# Patient Record
Sex: Female | Born: 1960 | Race: White | Hispanic: No | Marital: Married | Smoking: Former smoker
Health system: Southern US, Community
[De-identification: ages and names within clinical notes are randomized; demographics above are authoritative.]

## PROBLEM LIST (undated history)

## (undated) DIAGNOSIS — M62838 Other muscle spasm: Secondary | ICD-10-CM

## (undated) DIAGNOSIS — F419 Anxiety disorder, unspecified: Secondary | ICD-10-CM

## (undated) DIAGNOSIS — K219 Gastro-esophageal reflux disease without esophagitis: Secondary | ICD-10-CM

## (undated) DIAGNOSIS — G971 Other reaction to spinal and lumbar puncture: Secondary | ICD-10-CM

## (undated) DIAGNOSIS — M549 Dorsalgia, unspecified: Secondary | ICD-10-CM

## (undated) DIAGNOSIS — J189 Pneumonia, unspecified organism: Secondary | ICD-10-CM

## (undated) DIAGNOSIS — E785 Hyperlipidemia, unspecified: Secondary | ICD-10-CM

## (undated) DIAGNOSIS — IMO0002 Reserved for concepts with insufficient information to code with codable children: Secondary | ICD-10-CM

## (undated) DIAGNOSIS — N3010 Interstitial cystitis (chronic) without hematuria: Secondary | ICD-10-CM

## (undated) DIAGNOSIS — R112 Nausea with vomiting, unspecified: Secondary | ICD-10-CM

## (undated) DIAGNOSIS — F32A Depression, unspecified: Secondary | ICD-10-CM

## (undated) DIAGNOSIS — C801 Malignant (primary) neoplasm, unspecified: Secondary | ICD-10-CM

## (undated) DIAGNOSIS — Z87442 Personal history of urinary calculi: Secondary | ICD-10-CM

## (undated) DIAGNOSIS — R200 Anesthesia of skin: Secondary | ICD-10-CM

## (undated) DIAGNOSIS — M2550 Pain in unspecified joint: Secondary | ICD-10-CM

## (undated) DIAGNOSIS — R350 Frequency of micturition: Secondary | ICD-10-CM

## (undated) DIAGNOSIS — F329 Major depressive disorder, single episode, unspecified: Secondary | ICD-10-CM

## (undated) DIAGNOSIS — K2270 Barrett's esophagus without dysplasia: Secondary | ICD-10-CM

## (undated) DIAGNOSIS — G8929 Other chronic pain: Secondary | ICD-10-CM

## (undated) DIAGNOSIS — M2540 Effusion, unspecified joint: Secondary | ICD-10-CM

## (undated) DIAGNOSIS — Z9889 Other specified postprocedural states: Secondary | ICD-10-CM

## (undated) DIAGNOSIS — I10 Essential (primary) hypertension: Secondary | ICD-10-CM

## (undated) HISTORY — DX: Hyperlipidemia, unspecified: E78.5

## (undated) HISTORY — PX: CHOLECYSTECTOMY: SHX55

## (undated) HISTORY — DX: Essential (primary) hypertension: I10

## (undated) HISTORY — DX: Major depressive disorder, single episode, unspecified: F32.9

## (undated) HISTORY — DX: Reserved for concepts with insufficient information to code with codable children: IMO0002

## (undated) HISTORY — PX: OOPHORECTOMY: SHX86

## (undated) HISTORY — DX: Interstitial cystitis (chronic) without hematuria: N30.10

## (undated) HISTORY — DX: Anxiety disorder, unspecified: F41.9

## (undated) HISTORY — DX: Gastro-esophageal reflux disease without esophagitis: K21.9

## (undated) HISTORY — PX: APPENDECTOMY: SHX54

## (undated) HISTORY — PX: TOTAL ABDOMINAL HYSTERECTOMY: SHX209

## (undated) HISTORY — PX: KNEE ARTHROSCOPY: SUR90

## (undated) HISTORY — DX: Depression, unspecified: F32.A

## (undated) HISTORY — PX: OTHER SURGICAL HISTORY: SHX169

---

## 1998-05-01 ENCOUNTER — Emergency Department (HOSPITAL_COMMUNITY): Admission: EM | Admit: 1998-05-01 | Discharge: 1998-05-01 | Payer: Self-pay

## 1998-05-14 ENCOUNTER — Emergency Department (HOSPITAL_COMMUNITY): Admission: EM | Admit: 1998-05-14 | Discharge: 1998-05-14 | Payer: Self-pay | Admitting: Internal Medicine

## 1998-07-21 ENCOUNTER — Emergency Department (HOSPITAL_COMMUNITY): Admission: EM | Admit: 1998-07-21 | Discharge: 1998-07-21 | Payer: Self-pay | Admitting: Internal Medicine

## 1998-07-21 ENCOUNTER — Encounter: Payer: Self-pay | Admitting: Internal Medicine

## 1998-10-15 ENCOUNTER — Emergency Department (HOSPITAL_COMMUNITY): Admission: EM | Admit: 1998-10-15 | Discharge: 1998-10-15 | Payer: Self-pay | Admitting: Emergency Medicine

## 1998-10-15 ENCOUNTER — Encounter: Payer: Self-pay | Admitting: Emergency Medicine

## 1998-12-14 ENCOUNTER — Other Ambulatory Visit: Admission: RE | Admit: 1998-12-14 | Discharge: 1998-12-14 | Payer: Self-pay | Admitting: Gynecology

## 1999-03-05 ENCOUNTER — Observation Stay (HOSPITAL_COMMUNITY): Admission: EM | Admit: 1999-03-05 | Discharge: 1999-03-06 | Payer: Self-pay | Admitting: General Surgery

## 1999-03-05 ENCOUNTER — Encounter: Payer: Self-pay | Admitting: General Surgery

## 1999-07-09 ENCOUNTER — Emergency Department (HOSPITAL_COMMUNITY): Admission: EM | Admit: 1999-07-09 | Discharge: 1999-07-09 | Payer: Self-pay | Admitting: Emergency Medicine

## 1999-07-09 ENCOUNTER — Encounter: Payer: Self-pay | Admitting: Emergency Medicine

## 1999-08-30 ENCOUNTER — Emergency Department (HOSPITAL_COMMUNITY): Admission: EM | Admit: 1999-08-30 | Discharge: 1999-08-30 | Payer: Self-pay | Admitting: Emergency Medicine

## 1999-12-22 ENCOUNTER — Other Ambulatory Visit: Admission: RE | Admit: 1999-12-22 | Discharge: 1999-12-22 | Payer: Self-pay | Admitting: Gynecology

## 2001-01-12 ENCOUNTER — Other Ambulatory Visit: Admission: RE | Admit: 2001-01-12 | Discharge: 2001-01-12 | Payer: Self-pay | Admitting: Gynecology

## 2001-09-05 ENCOUNTER — Inpatient Hospital Stay (HOSPITAL_COMMUNITY): Admission: AD | Admit: 2001-09-05 | Discharge: 2001-09-07 | Payer: Self-pay | Admitting: Orthopedic Surgery

## 2001-09-06 ENCOUNTER — Encounter: Payer: Self-pay | Admitting: Orthopedic Surgery

## 2001-09-07 ENCOUNTER — Encounter: Payer: Self-pay | Admitting: Orthopaedic Surgery

## 2001-09-13 ENCOUNTER — Encounter: Payer: Self-pay | Admitting: Orthopaedic Surgery

## 2001-09-13 ENCOUNTER — Ambulatory Visit (HOSPITAL_COMMUNITY): Admission: RE | Admit: 2001-09-13 | Discharge: 2001-09-13 | Payer: Self-pay | Admitting: Orthopaedic Surgery

## 2001-09-20 ENCOUNTER — Observation Stay (HOSPITAL_COMMUNITY): Admission: RE | Admit: 2001-09-20 | Discharge: 2001-09-21 | Payer: Self-pay | Admitting: Orthopaedic Surgery

## 2001-09-20 ENCOUNTER — Encounter (INDEPENDENT_AMBULATORY_CARE_PROVIDER_SITE_OTHER): Payer: Self-pay | Admitting: Specialist

## 2001-09-20 ENCOUNTER — Encounter: Payer: Self-pay | Admitting: Orthopaedic Surgery

## 2002-01-01 ENCOUNTER — Other Ambulatory Visit: Admission: RE | Admit: 2002-01-01 | Discharge: 2002-01-01 | Payer: Self-pay | Admitting: Gynecology

## 2002-08-15 ENCOUNTER — Emergency Department (HOSPITAL_COMMUNITY): Admission: EM | Admit: 2002-08-15 | Discharge: 2002-08-15 | Payer: Self-pay | Admitting: Emergency Medicine

## 2002-08-15 ENCOUNTER — Encounter: Payer: Self-pay | Admitting: Emergency Medicine

## 2003-01-27 ENCOUNTER — Other Ambulatory Visit: Admission: RE | Admit: 2003-01-27 | Discharge: 2003-01-27 | Payer: Self-pay | Admitting: *Deleted

## 2003-06-21 ENCOUNTER — Encounter: Payer: Self-pay | Admitting: Emergency Medicine

## 2003-06-21 ENCOUNTER — Emergency Department (HOSPITAL_COMMUNITY): Admission: EM | Admit: 2003-06-21 | Discharge: 2003-06-21 | Payer: Self-pay | Admitting: Emergency Medicine

## 2003-10-27 ENCOUNTER — Encounter: Admission: RE | Admit: 2003-10-27 | Discharge: 2003-10-27 | Payer: Self-pay | Admitting: Sports Medicine

## 2003-11-10 ENCOUNTER — Encounter: Admission: RE | Admit: 2003-11-10 | Discharge: 2003-11-10 | Payer: Self-pay | Admitting: Sports Medicine

## 2003-11-21 ENCOUNTER — Encounter: Admission: RE | Admit: 2003-11-21 | Discharge: 2003-11-21 | Payer: Self-pay | Admitting: Sports Medicine

## 2004-01-13 ENCOUNTER — Ambulatory Visit (HOSPITAL_COMMUNITY): Admission: RE | Admit: 2004-01-13 | Discharge: 2004-01-14 | Payer: Self-pay | Admitting: Neurosurgery

## 2004-06-14 ENCOUNTER — Encounter: Admission: RE | Admit: 2004-06-14 | Discharge: 2004-06-14 | Payer: Self-pay | Admitting: Neurosurgery

## 2004-07-02 ENCOUNTER — Inpatient Hospital Stay (HOSPITAL_COMMUNITY): Admission: RE | Admit: 2004-07-02 | Discharge: 2004-07-05 | Payer: Self-pay | Admitting: Neurosurgery

## 2004-08-12 ENCOUNTER — Encounter: Admission: RE | Admit: 2004-08-12 | Discharge: 2004-08-12 | Payer: Self-pay | Admitting: Neurosurgery

## 2004-08-23 ENCOUNTER — Other Ambulatory Visit: Admission: RE | Admit: 2004-08-23 | Discharge: 2004-08-23 | Payer: Self-pay | Admitting: Gynecology

## 2004-08-26 ENCOUNTER — Ambulatory Visit: Payer: Self-pay | Admitting: Adult Health

## 2004-10-07 ENCOUNTER — Encounter: Admission: RE | Admit: 2004-10-07 | Discharge: 2004-10-07 | Payer: Self-pay | Admitting: Neurosurgery

## 2004-12-20 ENCOUNTER — Emergency Department (HOSPITAL_COMMUNITY): Admission: EM | Admit: 2004-12-20 | Discharge: 2004-12-20 | Payer: Self-pay | Admitting: Emergency Medicine

## 2005-01-17 ENCOUNTER — Emergency Department (HOSPITAL_COMMUNITY): Admission: EM | Admit: 2005-01-17 | Discharge: 2005-01-17 | Payer: Self-pay | Admitting: Emergency Medicine

## 2005-04-04 ENCOUNTER — Ambulatory Visit: Payer: Self-pay | Admitting: Internal Medicine

## 2005-05-16 ENCOUNTER — Ambulatory Visit: Payer: Self-pay | Admitting: Internal Medicine

## 2005-05-17 ENCOUNTER — Ambulatory Visit: Payer: Self-pay | Admitting: Pulmonary Disease

## 2005-08-11 ENCOUNTER — Other Ambulatory Visit: Admission: RE | Admit: 2005-08-11 | Discharge: 2005-08-11 | Payer: Self-pay | Admitting: Gynecology

## 2005-08-16 ENCOUNTER — Ambulatory Visit: Payer: Self-pay | Admitting: Internal Medicine

## 2005-08-20 IMAGING — CR DG LUMBAR SPINE 2-3V
3 series · 3 of 3 positions shown · non-contrast
Comparison: none

CLINICAL DATA: Post back surgery with low back pain and right lower extremity radicular symptoms.
 DIAGNOSTIC LUMBAR SPINE ? TWO VIEWS:

[view not recorded (1 of 3)]
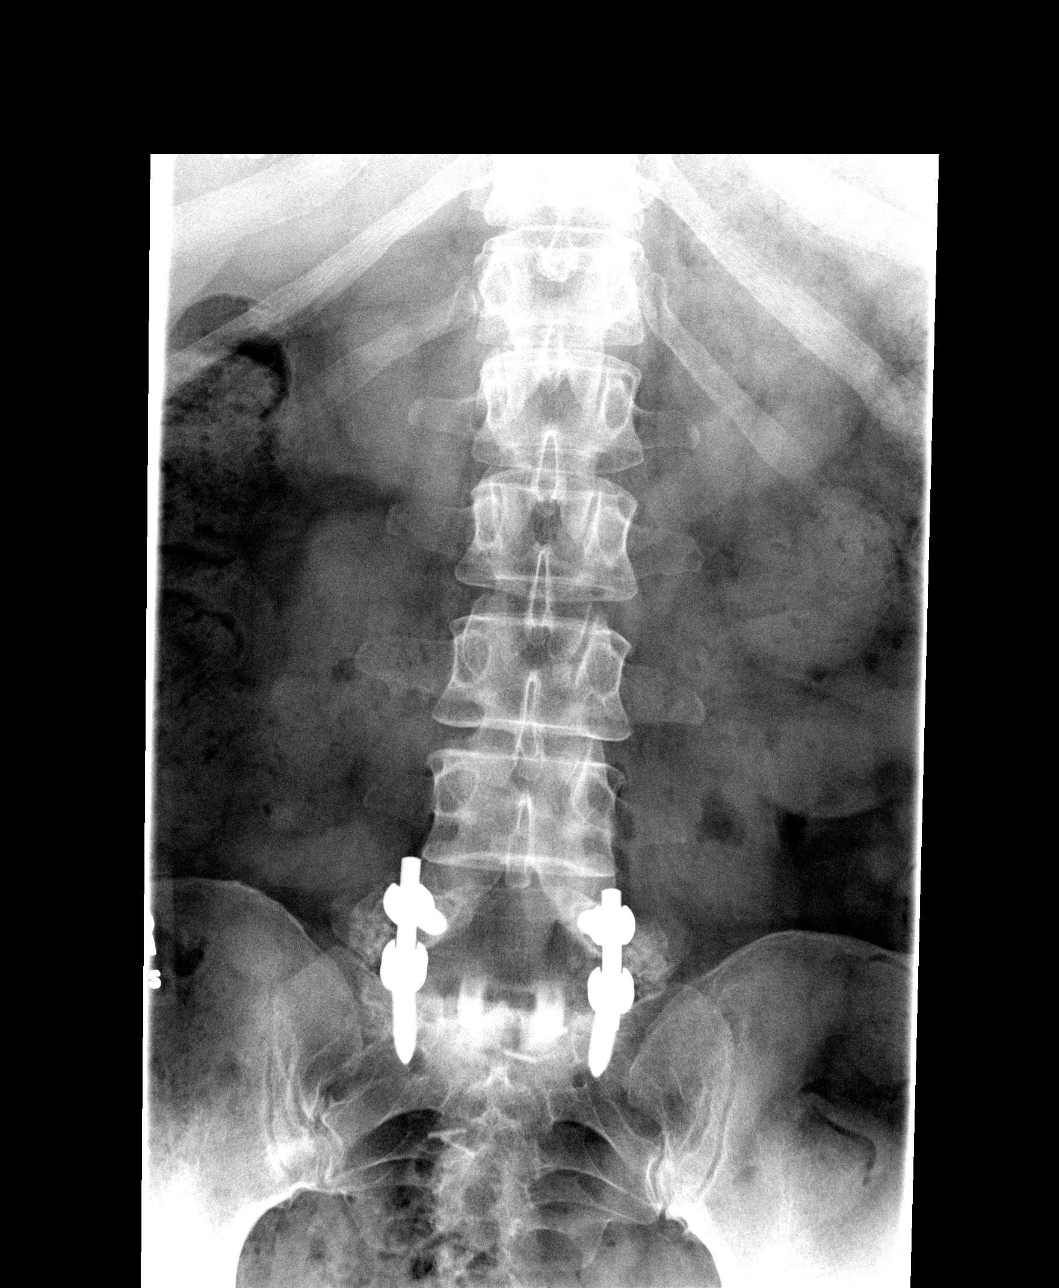

[view not recorded (2 of 3)]
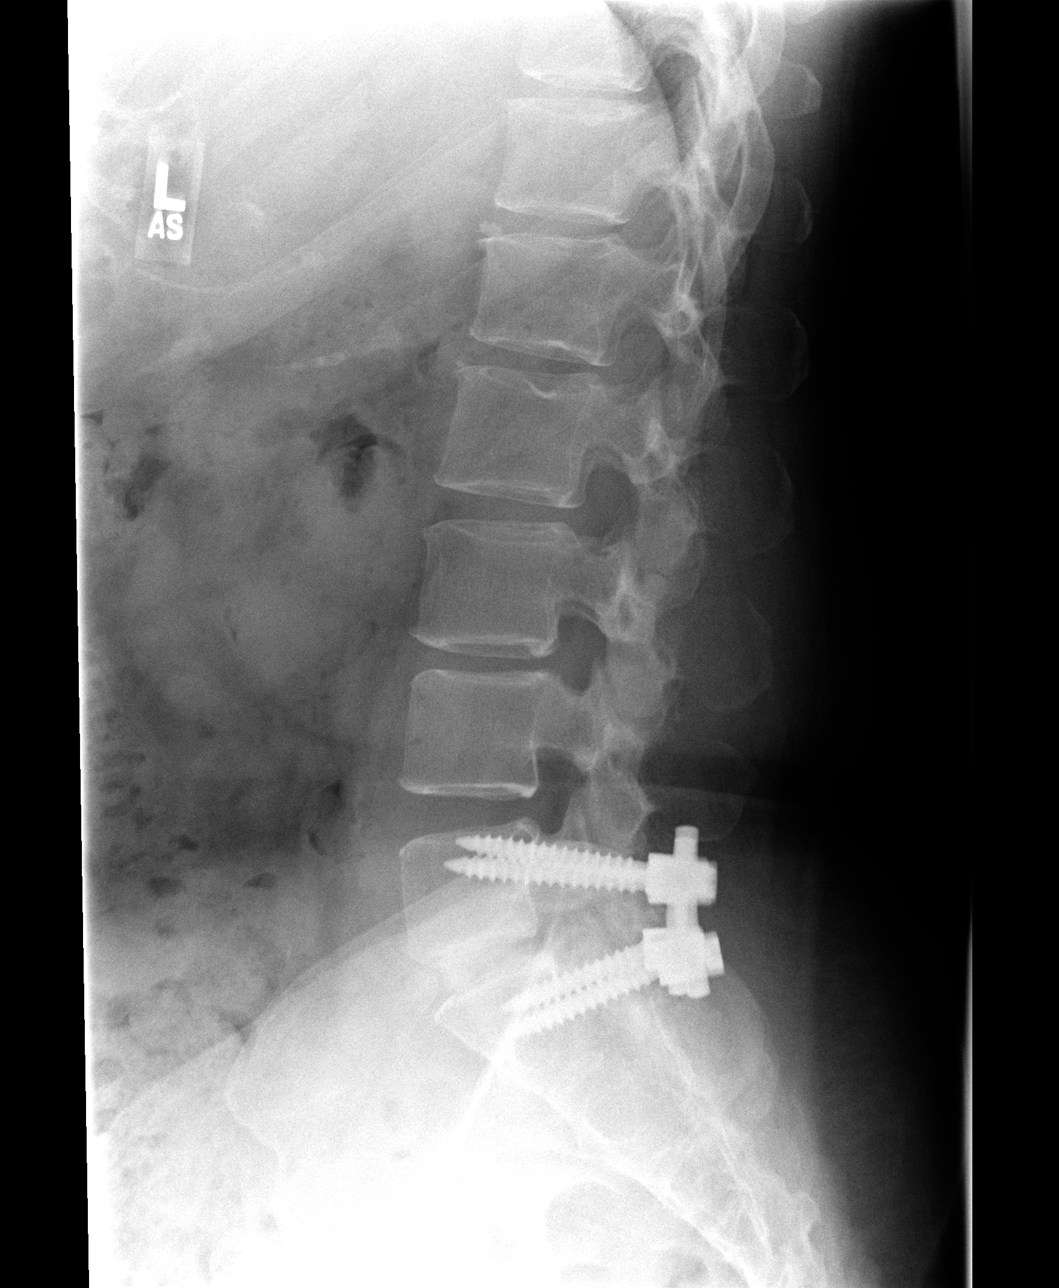

[view not recorded (3 of 3)]
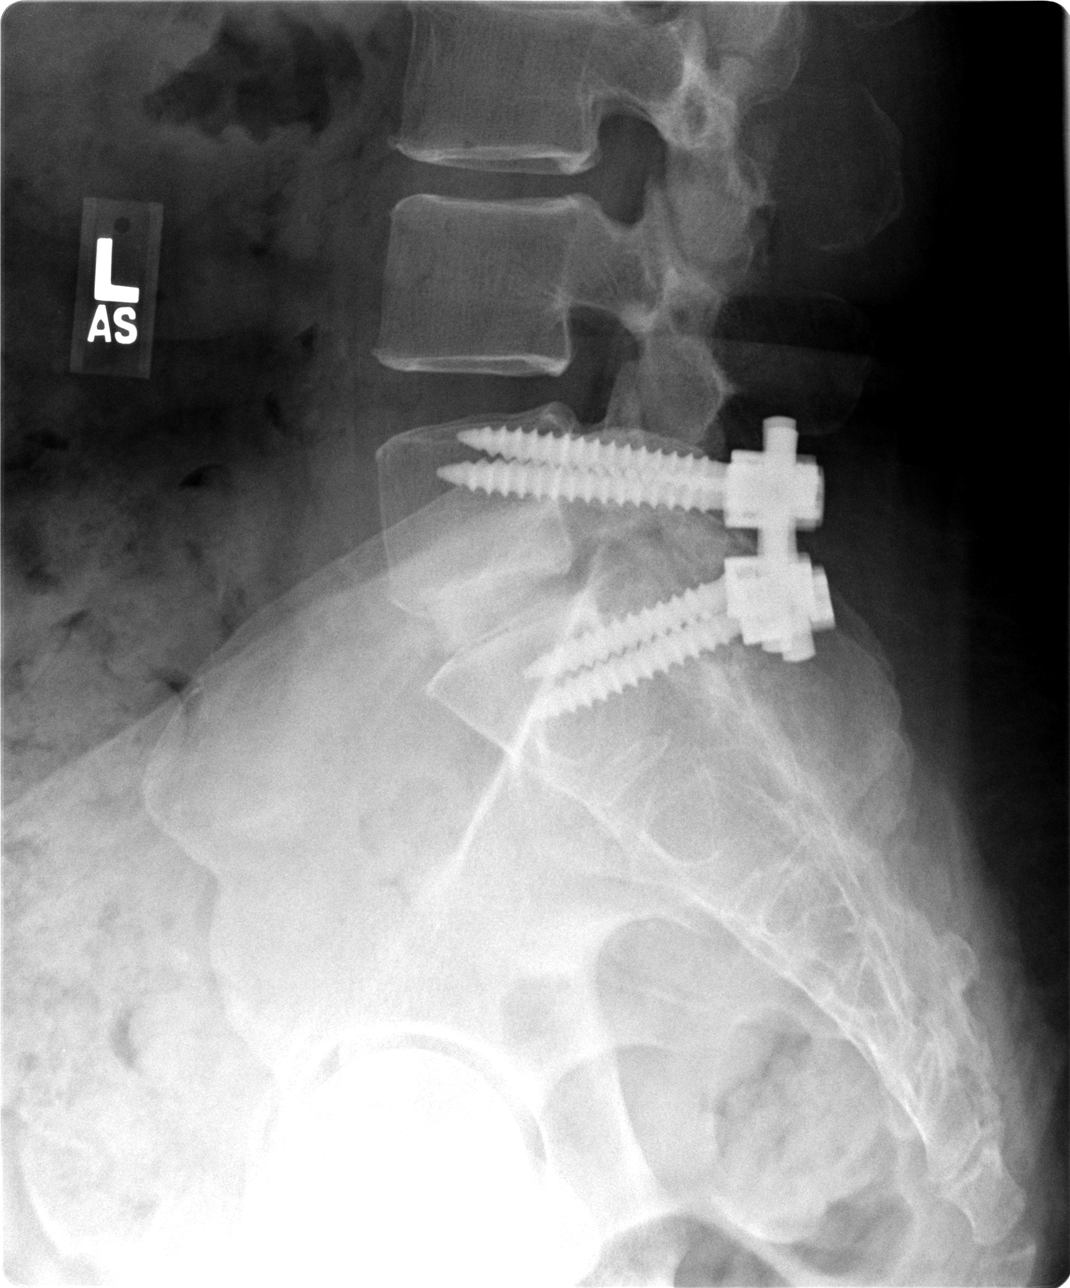

[3 of 3 positions shown; findings below may reference images not displayed]

FINDINGS: Since 08/12/04, there is no interval change with stable satisfactory laminectomy posterior interbody fusion L5-S1.  The remaining disk levels and posterior vertebral alignment are stable with no new significant abnormality seen.
IMPRESSION: Since 08/12/04 ? no interval change:
 1.  Satisfactory posterior laminectomy interbody fusion L5-S1.
 2.  Otherwise negative.

## 2005-09-08 ENCOUNTER — Ambulatory Visit: Payer: Self-pay | Admitting: Internal Medicine

## 2005-11-22 ENCOUNTER — Encounter: Admission: RE | Admit: 2005-11-22 | Discharge: 2005-11-22 | Payer: Self-pay | Admitting: Neurosurgery

## 2006-02-27 ENCOUNTER — Ambulatory Visit (HOSPITAL_COMMUNITY): Admission: RE | Admit: 2006-02-27 | Discharge: 2006-02-28 | Payer: Self-pay | Admitting: Neurosurgery

## 2006-03-01 ENCOUNTER — Emergency Department (HOSPITAL_COMMUNITY): Admission: EM | Admit: 2006-03-01 | Discharge: 2006-03-01 | Payer: Self-pay | Admitting: Emergency Medicine

## 2006-03-08 ENCOUNTER — Ambulatory Visit: Payer: Self-pay | Admitting: Internal Medicine

## 2006-03-21 ENCOUNTER — Ambulatory Visit: Payer: Self-pay | Admitting: Pulmonary Disease

## 2006-08-15 ENCOUNTER — Other Ambulatory Visit: Admission: RE | Admit: 2006-08-15 | Discharge: 2006-08-15 | Payer: Self-pay | Admitting: Gynecology

## 2006-09-07 ENCOUNTER — Inpatient Hospital Stay (HOSPITAL_COMMUNITY): Admission: RE | Admit: 2006-09-07 | Discharge: 2006-09-08 | Payer: Self-pay | Admitting: Neurosurgery

## 2006-12-04 ENCOUNTER — Emergency Department (HOSPITAL_COMMUNITY): Admission: EM | Admit: 2006-12-04 | Discharge: 2006-12-04 | Payer: Self-pay | Admitting: Emergency Medicine

## 2006-12-13 ENCOUNTER — Ambulatory Visit: Payer: Self-pay | Admitting: Internal Medicine

## 2006-12-13 LAB — CONVERTED CEMR LAB
Albumin: 4.2 g/dL (ref 3.5–5.2)
Alkaline Phosphatase: 66 units/L (ref 39–117)
BUN: 10 mg/dL (ref 6–23)
Basophils Absolute: 0.1 10*3/uL (ref 0.0–0.1)
CRP, High Sensitivity: 4 (ref 0.00–5.00)
Creatinine, Ser: 0.7 mg/dL (ref 0.4–1.2)
GFR calc Af Amer: 117 mL/min
Hemoglobin: 14.3 g/dL (ref 12.0–15.0)
MCHC: 35.1 g/dL (ref 30.0–36.0)
Monocytes Absolute: 0.4 10*3/uL (ref 0.2–0.7)
Monocytes Relative: 4.4 % (ref 3.0–11.0)
Potassium: 4 meq/L (ref 3.5–5.1)
RDW: 11.9 % (ref 11.5–14.6)
TSH: 0.89 microintl units/mL (ref 0.35–5.50)
Total Bilirubin: 0.8 mg/dL (ref 0.3–1.2)

## 2007-01-31 ENCOUNTER — Ambulatory Visit: Payer: Self-pay | Admitting: Internal Medicine

## 2007-01-31 LAB — CONVERTED CEMR LAB
HDL: 51.6 mg/dL (ref >39.0)
Triglycerides: 91 mg/dL (ref 0–149)

## 2007-03-13 ENCOUNTER — Encounter: Admission: RE | Admit: 2007-03-13 | Discharge: 2007-03-13 | Payer: Self-pay | Admitting: Neurosurgery

## 2007-07-23 ENCOUNTER — Ambulatory Visit: Payer: Self-pay | Admitting: Internal Medicine

## 2007-07-23 LAB — CONVERTED CEMR LAB
AST: 28 units/L (ref 0–37)
Alkaline Phosphatase: 76 units/L (ref 39–117)
Basophils Relative: 0.7 % (ref 0.0–1.0)
CO2: 29 meq/L (ref 19–32)
Creatinine, Ser: 0.8 mg/dL (ref 0.4–1.2)
Direct LDL: 148.6 mg/dL
HCT: 40.4 % (ref 36.0–46.0)
Hemoglobin: 14.1 g/dL (ref 12.0–15.0)
Monocytes Absolute: 0.5 10*3/uL (ref 0.2–0.7)
Neutrophils Relative %: 64 % (ref 43.0–77.0)
Potassium: 3.5 meq/L (ref 3.5–5.1)
RDW: 12.2 % (ref 11.5–14.6)
Sodium: 141 meq/L (ref 135–145)
Total Bilirubin: 0.7 mg/dL (ref 0.3–1.2)
Total CHOL/HDL Ratio: 4.8
Total Protein: 7.4 g/dL (ref 6.0–8.3)
VLDL: 31 mg/dL (ref 0–40)
WBC: 8.3 10*3/uL (ref 4.5–10.5)

## 2007-09-11 ENCOUNTER — Ambulatory Visit (HOSPITAL_COMMUNITY): Admission: RE | Admit: 2007-09-11 | Discharge: 2007-09-12 | Payer: Self-pay | Admitting: Neurosurgery

## 2007-10-15 ENCOUNTER — Encounter: Admission: RE | Admit: 2007-10-15 | Discharge: 2007-10-15 | Payer: Self-pay | Admitting: Neurosurgery

## 2007-10-30 ENCOUNTER — Encounter: Admission: RE | Admit: 2007-10-30 | Discharge: 2007-10-30 | Payer: Self-pay | Admitting: Neurosurgery

## 2007-11-29 ENCOUNTER — Encounter: Admission: RE | Admit: 2007-11-29 | Discharge: 2007-11-29 | Payer: Self-pay | Admitting: Neurosurgery

## 2007-12-24 ENCOUNTER — Inpatient Hospital Stay (HOSPITAL_COMMUNITY): Admission: RE | Admit: 2007-12-24 | Discharge: 2007-12-26 | Payer: Self-pay | Admitting: Neurosurgery

## 2007-12-25 DIAGNOSIS — N3010 Interstitial cystitis (chronic) without hematuria: Secondary | ICD-10-CM | POA: Insufficient documentation

## 2007-12-25 DIAGNOSIS — IMO0002 Reserved for concepts with insufficient information to code with codable children: Secondary | ICD-10-CM

## 2007-12-25 DIAGNOSIS — K219 Gastro-esophageal reflux disease without esophagitis: Secondary | ICD-10-CM

## 2007-12-25 DIAGNOSIS — E119 Type 2 diabetes mellitus without complications: Secondary | ICD-10-CM | POA: Insufficient documentation

## 2007-12-25 DIAGNOSIS — R55 Syncope and collapse: Secondary | ICD-10-CM

## 2007-12-25 DIAGNOSIS — E669 Obesity, unspecified: Secondary | ICD-10-CM | POA: Insufficient documentation

## 2007-12-25 DIAGNOSIS — E785 Hyperlipidemia, unspecified: Secondary | ICD-10-CM | POA: Insufficient documentation

## 2007-12-25 DIAGNOSIS — I10 Essential (primary) hypertension: Secondary | ICD-10-CM | POA: Insufficient documentation

## 2007-12-29 ENCOUNTER — Encounter: Payer: Self-pay | Admitting: Internal Medicine

## 2007-12-30 HISTORY — PX: NECK SURGERY: SHX720

## 2007-12-30 HISTORY — PX: BACK SURGERY: SHX140

## 2008-01-19 ENCOUNTER — Encounter: Payer: Self-pay | Admitting: Internal Medicine

## 2008-01-24 ENCOUNTER — Encounter: Admission: RE | Admit: 2008-01-24 | Discharge: 2008-01-24 | Payer: Self-pay | Admitting: Neurosurgery

## 2008-03-18 ENCOUNTER — Encounter: Admission: RE | Admit: 2008-03-18 | Discharge: 2008-03-18 | Payer: Self-pay | Admitting: Neurosurgery

## 2008-03-20 ENCOUNTER — Encounter: Admission: RE | Admit: 2008-03-20 | Discharge: 2008-03-20 | Payer: Self-pay | Admitting: Neurosurgery

## 2008-04-02 ENCOUNTER — Telehealth: Payer: Self-pay | Admitting: Adult Health

## 2008-04-02 ENCOUNTER — Ambulatory Visit: Payer: Self-pay | Admitting: Internal Medicine

## 2008-04-02 DIAGNOSIS — F32A Depression, unspecified: Secondary | ICD-10-CM | POA: Insufficient documentation

## 2008-04-02 DIAGNOSIS — F329 Major depressive disorder, single episode, unspecified: Secondary | ICD-10-CM

## 2008-05-07 ENCOUNTER — Other Ambulatory Visit: Admission: RE | Admit: 2008-05-07 | Discharge: 2008-05-07 | Payer: Self-pay | Admitting: Gynecology

## 2008-05-19 ENCOUNTER — Ambulatory Visit: Payer: Self-pay | Admitting: Internal Medicine

## 2008-05-22 ENCOUNTER — Telehealth (INDEPENDENT_AMBULATORY_CARE_PROVIDER_SITE_OTHER): Payer: Self-pay | Admitting: *Deleted

## 2008-05-22 LAB — CONVERTED CEMR LAB
Basophils Absolute: 0 10*3/uL (ref 0.0–0.1)
Bilirubin, Direct: 0.1 mg/dL (ref 0.0–0.3)
CO2: 33 meq/L — ABNORMAL HIGH (ref 19–32)
Calcium: 9.9 mg/dL (ref 8.4–10.5)
Creatinine, Ser: 0.8 mg/dL (ref 0.4–1.2)
Eosinophils Absolute: 0.3 10*3/uL (ref 0.0–0.7)
GFR calc Af Amer: 99 mL/min
GFR calc non Af Amer: 82 mL/min
Hemoglobin: 13.2 g/dL (ref 12.0–15.0)
Hgb A1c MFr Bld: 6.4 % — ABNORMAL HIGH (ref 4.6–6.0)
Lymphocytes Relative: 31 % (ref 12.0–46.0)
MCHC: 34.4 g/dL (ref 30.0–36.0)
MCV: 94.6 fL (ref 78.0–100.0)
Monocytes Absolute: 0.4 10*3/uL (ref 0.1–1.0)
Neutro Abs: 3.1 10*3/uL (ref 1.4–7.7)
RDW: 13.1 % (ref 11.5–14.6)
Sodium: 142 meq/L (ref 135–145)
TSH: 0.94 microintl units/mL (ref 0.35–5.50)
Total Bilirubin: 0.8 mg/dL (ref 0.3–1.2)

## 2008-05-23 ENCOUNTER — Ambulatory Visit (HOSPITAL_BASED_OUTPATIENT_CLINIC_OR_DEPARTMENT_OTHER): Admission: RE | Admit: 2008-05-23 | Discharge: 2008-05-23 | Payer: Self-pay | Admitting: Orthopedic Surgery

## 2008-06-02 ENCOUNTER — Emergency Department (HOSPITAL_COMMUNITY): Admission: EM | Admit: 2008-06-02 | Discharge: 2008-06-03 | Payer: Self-pay | Admitting: Emergency Medicine

## 2008-06-03 ENCOUNTER — Ambulatory Visit: Payer: Self-pay | Admitting: Vascular Surgery

## 2008-06-03 ENCOUNTER — Ambulatory Visit (HOSPITAL_COMMUNITY): Admission: RE | Admit: 2008-06-03 | Discharge: 2008-06-03 | Payer: Self-pay | Admitting: Emergency Medicine

## 2008-06-03 ENCOUNTER — Encounter (INDEPENDENT_AMBULATORY_CARE_PROVIDER_SITE_OTHER): Payer: Self-pay | Admitting: *Deleted

## 2008-06-09 ENCOUNTER — Telehealth (INDEPENDENT_AMBULATORY_CARE_PROVIDER_SITE_OTHER): Payer: Self-pay | Admitting: *Deleted

## 2008-06-11 ENCOUNTER — Telehealth (INDEPENDENT_AMBULATORY_CARE_PROVIDER_SITE_OTHER): Payer: Self-pay | Admitting: *Deleted

## 2008-06-23 ENCOUNTER — Telehealth (INDEPENDENT_AMBULATORY_CARE_PROVIDER_SITE_OTHER): Payer: Self-pay | Admitting: *Deleted

## 2008-11-05 IMAGING — RF DG LUMBAR SPINE 2-3V
1 series · 2 of 2 positions shown · non-contrast
Comparison: none

CLINICAL DATA: Posterior lumbar fusion.  
 LUMBAR SPINE ? 2 VIEW:

[Series 1: run · 2 of 2 slices shown]
[im 1/2]
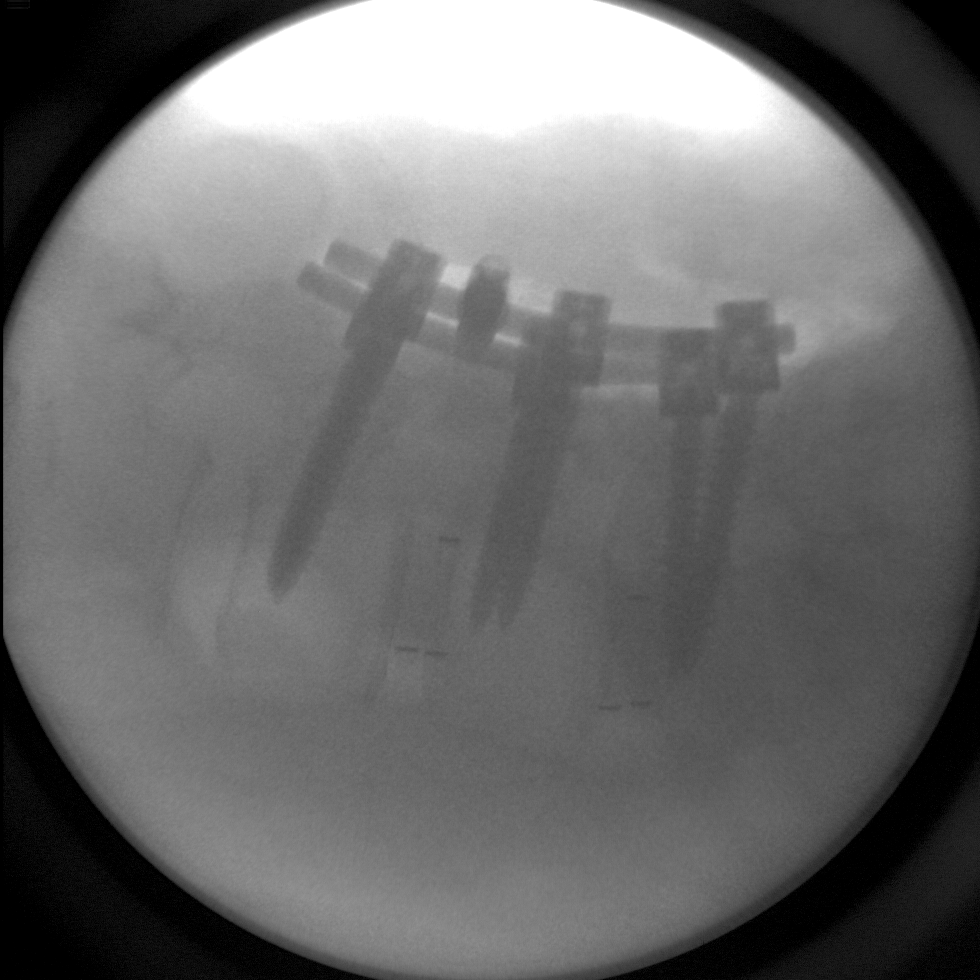
[im 2/2]
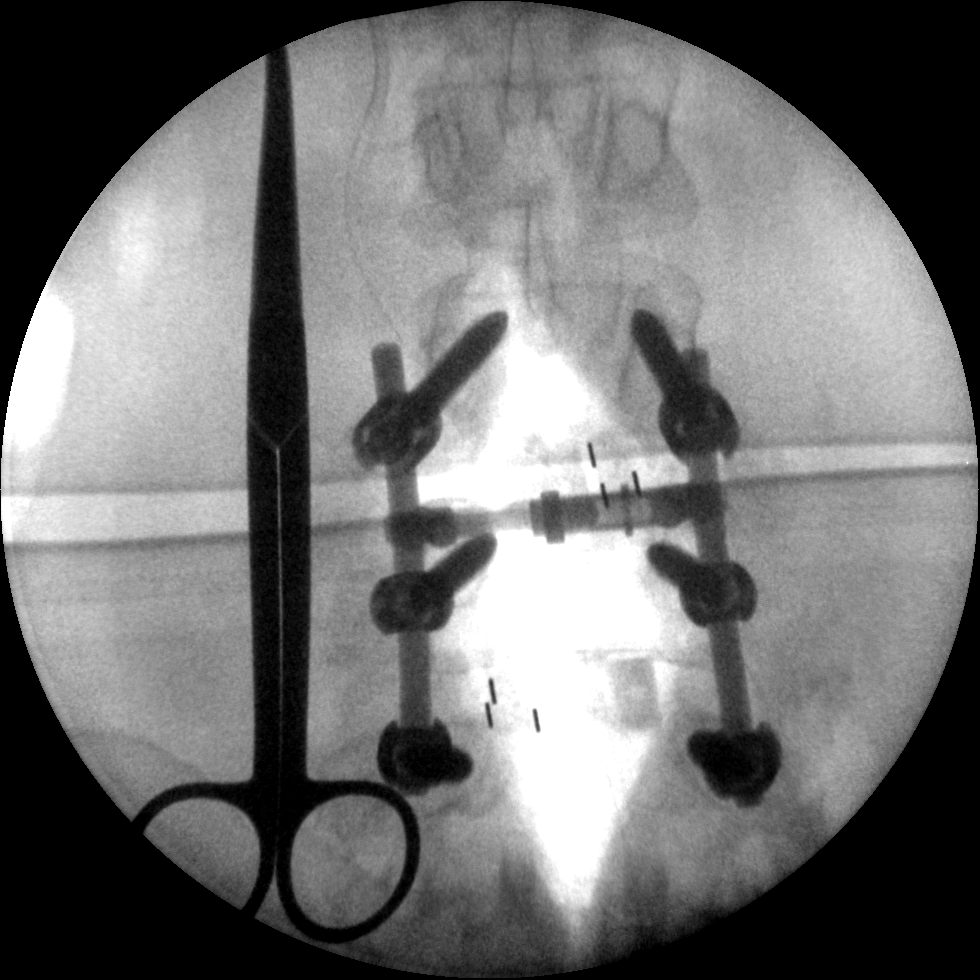

[2 of 2 positions shown; findings below may reference images not displayed]

FINDINGS: Posterior fusion has been performed of the lower lumbar spine.  The exact levels are difficult to determine on this limited field of view C-arm spot film, probably at L3-4 and L4-5.  Interbody fusion plugs are in good position.
IMPRESSION: Posterior fusion of the lower lumbar spine, probably at L3-4 and L4-5 as noted above.  C-arm images are limited field of view.

## 2008-12-10 ENCOUNTER — Telehealth (INDEPENDENT_AMBULATORY_CARE_PROVIDER_SITE_OTHER): Payer: Self-pay | Admitting: *Deleted

## 2009-01-07 ENCOUNTER — Encounter: Payer: Self-pay | Admitting: Internal Medicine

## 2009-01-19 ENCOUNTER — Encounter: Admission: RE | Admit: 2009-01-19 | Discharge: 2009-01-19 | Payer: Self-pay | Admitting: Neurosurgery

## 2009-02-06 ENCOUNTER — Inpatient Hospital Stay (HOSPITAL_COMMUNITY): Admission: RE | Admit: 2009-02-06 | Discharge: 2009-02-06 | Payer: Self-pay | Admitting: Neurosurgery

## 2009-02-27 ENCOUNTER — Emergency Department (HOSPITAL_COMMUNITY): Admission: EM | Admit: 2009-02-27 | Discharge: 2009-02-27 | Payer: Self-pay | Admitting: Emergency Medicine

## 2009-04-29 ENCOUNTER — Ambulatory Visit: Payer: Self-pay | Admitting: Internal Medicine

## 2009-04-29 ENCOUNTER — Encounter: Payer: Self-pay | Admitting: Adult Health

## 2009-04-29 ENCOUNTER — Ambulatory Visit: Payer: Self-pay | Admitting: Adult Health

## 2009-04-29 LAB — CONVERTED CEMR LAB
Bilirubin Urine: NEGATIVE
Hemoglobin, Urine: NEGATIVE
Ketones, ur: NEGATIVE mg/dL
Nitrite: NEGATIVE
Total Protein, Urine: 30 mg/dL
Urine Glucose: NEGATIVE mg/dL
pH: 8.5 (ref 5.0–8.0)

## 2009-04-30 LAB — CONVERTED CEMR LAB
ALT: 19 units/L (ref 0–35)
Albumin: 4.1 g/dL (ref 3.5–5.2)
Alkaline Phosphatase: 62 units/L (ref 39–117)
Basophils Relative: 0.9 % (ref 0.0–3.0)
Bilirubin Urine: NEGATIVE
Bilirubin, Direct: 0.1 mg/dL (ref 0.0–0.3)
CO2: 29 meq/L (ref 19–32)
Chloride: 104 meq/L (ref 96–112)
Cholesterol: 153 mg/dL (ref 0–200)
Eosinophils Absolute: 0.3 10*3/uL (ref 0.0–0.7)
Eosinophils Relative: 4.4 % (ref 0.0–5.0)
HDL: 45 mg/dL (ref >39.00)
Hemoglobin, Urine: NEGATIVE
Hemoglobin: 14 g/dL (ref 12.0–15.0)
Ketones, ur: NEGATIVE mg/dL
MCHC: 34 g/dL (ref 30.0–36.0)
MCV: 96.1 fL (ref 78.0–100.0)
Monocytes Absolute: 0.3 10*3/uL (ref 0.1–1.0)
Neutro Abs: 4.9 10*3/uL (ref 1.4–7.7)
Neutrophils Relative %: 67.8 % (ref 43.0–77.0)
Potassium: 3.7 meq/L (ref 3.5–5.1)
RBC: 4.3 M/uL (ref 3.87–5.11)
Sodium: 142 meq/L (ref 135–145)
Total Protein, Urine: 30 mg/dL
Total Protein: 6.8 g/dL (ref 6.0–8.3)
Triglycerides: 146 mg/dL (ref 0.0–149.0)
VLDL: 29.2 mg/dL (ref 0.0–40.0)
Vit D, 25-Hydroxy: 34 ng/mL (ref 30–89)
WBC: 7.3 10*3/uL (ref 4.5–10.5)
pH: 8.5 (ref 5.0–8.0)

## 2009-05-31 LAB — CONVERTED CEMR LAB

## 2009-06-16 ENCOUNTER — Encounter: Payer: Self-pay | Admitting: Internal Medicine

## 2009-06-23 ENCOUNTER — Encounter: Payer: Self-pay | Admitting: Internal Medicine

## 2009-09-08 ENCOUNTER — Ambulatory Visit: Payer: Self-pay | Admitting: Internal Medicine

## 2009-09-08 ENCOUNTER — Telehealth: Payer: Self-pay | Admitting: Internal Medicine

## 2009-09-08 ENCOUNTER — Encounter: Admission: RE | Admit: 2009-09-08 | Discharge: 2009-09-08 | Payer: Self-pay | Admitting: Internal Medicine

## 2009-09-08 DIAGNOSIS — F17200 Nicotine dependence, unspecified, uncomplicated: Secondary | ICD-10-CM

## 2009-09-08 DIAGNOSIS — R109 Unspecified abdominal pain: Secondary | ICD-10-CM | POA: Insufficient documentation

## 2009-09-08 DIAGNOSIS — R079 Chest pain, unspecified: Secondary | ICD-10-CM | POA: Insufficient documentation

## 2009-09-08 LAB — CONVERTED CEMR LAB
AST: 23 units/L (ref 0–37)
BUN: 13 mg/dL (ref 6–23)
Basophils Relative: 0.6 % (ref 0.0–3.0)
Bilirubin Urine: NEGATIVE
Bilirubin, Direct: 0.1 mg/dL (ref 0.0–0.3)
Eosinophils Absolute: 0.5 10*3/uL (ref 0.0–0.7)
Eosinophils Relative: 5.6 % — ABNORMAL HIGH (ref 0.0–5.0)
GFR calc non Af Amer: 95.17 mL/min (ref >60)
HCT: 38.9 % (ref 36.0–46.0)
Hgb A1c MFr Bld: 6.1 % (ref 4.6–6.5)
Ketones, ur: NEGATIVE mg/dL
Lymphs Abs: 2.5 10*3/uL (ref 0.7–4.0)
MCHC: 35.1 g/dL (ref 30.0–36.0)
MCV: 97.3 fL (ref 78.0–100.0)
Monocytes Absolute: 0.5 10*3/uL (ref 0.1–1.0)
Platelets: 278 10*3/uL (ref 150.0–400.0)
Potassium: 3.6 meq/L (ref 3.5–5.1)
Sodium: 143 meq/L (ref 135–145)
Total Bilirubin: 0.7 mg/dL (ref 0.3–1.2)
Total Protein, Urine: NEGATIVE mg/dL
WBC: 8.4 10*3/uL (ref 4.5–10.5)
pH: 5 (ref 5.0–8.0)

## 2009-09-15 ENCOUNTER — Ambulatory Visit: Payer: Self-pay | Admitting: Internal Medicine

## 2009-09-15 LAB — CONVERTED CEMR LAB
Bilirubin Urine: NEGATIVE
Hemoglobin, Urine: NEGATIVE
Nitrite: NEGATIVE
Urobilinogen, UA: 1 (ref 0.0–1.0)

## 2009-09-16 ENCOUNTER — Encounter: Payer: Self-pay | Admitting: Adult Health

## 2009-09-28 ENCOUNTER — Encounter: Admission: RE | Admit: 2009-09-28 | Discharge: 2009-09-28 | Payer: Self-pay | Admitting: Neurosurgery

## 2009-10-31 HISTORY — PX: COLONOSCOPY: SHX174

## 2009-12-22 ENCOUNTER — Ambulatory Visit: Payer: Self-pay | Admitting: Internal Medicine

## 2009-12-22 DIAGNOSIS — H9209 Otalgia, unspecified ear: Secondary | ICD-10-CM | POA: Insufficient documentation

## 2010-01-25 ENCOUNTER — Ambulatory Visit: Payer: Self-pay | Admitting: Internal Medicine

## 2010-01-27 ENCOUNTER — Ambulatory Visit: Payer: Self-pay | Admitting: Adult Health

## 2010-01-28 LAB — CONVERTED CEMR LAB
Albumin: 4.3 g/dL (ref 3.5–5.2)
Alkaline Phosphatase: 56 units/L (ref 39–117)
Calcium: 9.5 mg/dL (ref 8.4–10.5)
GFR calc non Af Amer: 113.51 mL/min (ref >60)
Glucose, Bld: 95 mg/dL (ref 70–99)
HDL: 40.3 mg/dL (ref >39.00)
Nitrite: NEGATIVE
Sodium: 142 meq/L (ref 135–145)
Specific Gravity, Urine: >=1.03 (ref 1.000–1.030)
Total CHOL/HDL Ratio: 3
Urine Glucose: NEGATIVE mg/dL
Urobilinogen, UA: 0.2 (ref 0.0–1.0)
VLDL: 36.2 mg/dL (ref 0.0–40.0)

## 2010-02-08 ENCOUNTER — Encounter: Payer: Self-pay | Admitting: Internal Medicine

## 2010-02-08 ENCOUNTER — Encounter: Admission: RE | Admit: 2010-02-08 | Discharge: 2010-02-08 | Payer: Self-pay | Admitting: Internal Medicine

## 2010-02-11 ENCOUNTER — Encounter: Admission: RE | Admit: 2010-02-11 | Discharge: 2010-02-11 | Payer: Self-pay | Admitting: Neurosurgery

## 2010-02-11 ENCOUNTER — Encounter: Admission: RE | Admit: 2010-02-11 | Discharge: 2010-02-11 | Payer: Self-pay | Admitting: Gynecology

## 2010-02-18 ENCOUNTER — Encounter: Admission: RE | Admit: 2010-02-18 | Discharge: 2010-02-18 | Payer: Self-pay | Admitting: Neurosurgery

## 2010-02-22 ENCOUNTER — Encounter: Admission: RE | Admit: 2010-02-22 | Discharge: 2010-02-22 | Payer: Self-pay | Admitting: Neurosurgery

## 2010-03-30 ENCOUNTER — Telehealth: Payer: Self-pay | Admitting: Internal Medicine

## 2010-04-01 ENCOUNTER — Encounter: Payer: Self-pay | Admitting: Internal Medicine

## 2010-05-20 ENCOUNTER — Ambulatory Visit: Payer: Self-pay | Admitting: Internal Medicine

## 2010-05-20 ENCOUNTER — Telehealth: Payer: Self-pay | Admitting: Internal Medicine

## 2010-05-20 LAB — CONVERTED CEMR LAB
Albumin: 4.7 g/dL (ref 3.5–5.2)
Alkaline Phosphatase: 72 units/L (ref 39–117)
BUN: 12 mg/dL (ref 6–23)
Basophils Absolute: 0.1 10*3/uL (ref 0.0–0.1)
Bilirubin, Direct: 0.1 mg/dL (ref 0.0–0.3)
CO2: 31 meq/L (ref 19–32)
Calcium: 9.9 mg/dL (ref 8.4–10.5)
Creatinine, Ser: 0.7 mg/dL (ref 0.4–1.2)
Eosinophils Absolute: 0.6 10*3/uL (ref 0.0–0.7)
Glucose, Bld: 123 mg/dL — ABNORMAL HIGH (ref 70–99)
Lipase: 13 units/L (ref 11.0–59.0)
Lymphocytes Relative: 22.9 % (ref 12.0–46.0)
MCHC: 34.8 g/dL (ref 30.0–36.0)
Monocytes Absolute: 0.5 10*3/uL (ref 0.1–1.0)
Neutrophils Relative %: 65.3 % (ref 43.0–77.0)
Nitrite: NEGATIVE
Platelets: 324 10*3/uL (ref 150.0–400.0)
RDW: 12.7 % (ref 11.5–14.6)
Sed Rate: 8 mm/hr (ref 0–22)
Specific Gravity, Urine: >=1.03 (ref 1.000–1.030)
Urobilinogen, UA: 1 (ref 0.0–1.0)
pH: 5 (ref 5.0–8.0)

## 2010-05-24 ENCOUNTER — Telehealth: Payer: Self-pay | Admitting: Internal Medicine

## 2010-05-24 ENCOUNTER — Encounter: Payer: Self-pay | Admitting: Physician Assistant

## 2010-05-24 ENCOUNTER — Ambulatory Visit: Payer: Self-pay | Admitting: Internal Medicine

## 2010-05-24 DIAGNOSIS — R1013 Epigastric pain: Secondary | ICD-10-CM

## 2010-05-24 DIAGNOSIS — K7689 Other specified diseases of liver: Secondary | ICD-10-CM

## 2010-05-24 DIAGNOSIS — R197 Diarrhea, unspecified: Secondary | ICD-10-CM | POA: Insufficient documentation

## 2010-05-24 DIAGNOSIS — R112 Nausea with vomiting, unspecified: Secondary | ICD-10-CM

## 2010-05-24 DIAGNOSIS — R634 Abnormal weight loss: Secondary | ICD-10-CM

## 2010-05-26 ENCOUNTER — Encounter: Admission: RE | Admit: 2010-05-26 | Discharge: 2010-05-26 | Payer: Self-pay | Admitting: Neurosurgery

## 2010-06-07 ENCOUNTER — Encounter: Admission: RE | Admit: 2010-06-07 | Discharge: 2010-06-07 | Payer: Self-pay | Admitting: Neurosurgery

## 2010-06-08 ENCOUNTER — Ambulatory Visit: Payer: Self-pay | Admitting: Internal Medicine

## 2010-06-10 ENCOUNTER — Ambulatory Visit: Payer: Self-pay | Admitting: Internal Medicine

## 2010-06-12 ENCOUNTER — Encounter: Payer: Self-pay | Admitting: Internal Medicine

## 2010-06-15 ENCOUNTER — Encounter: Payer: Self-pay | Admitting: Internal Medicine

## 2010-06-22 ENCOUNTER — Telehealth: Payer: Self-pay | Admitting: Internal Medicine

## 2010-06-29 ENCOUNTER — Ambulatory Visit: Payer: Self-pay | Admitting: Internal Medicine

## 2010-06-29 DIAGNOSIS — R1011 Right upper quadrant pain: Secondary | ICD-10-CM

## 2010-07-01 ENCOUNTER — Encounter: Payer: Self-pay | Admitting: Internal Medicine

## 2010-07-02 ENCOUNTER — Telehealth (INDEPENDENT_AMBULATORY_CARE_PROVIDER_SITE_OTHER): Payer: Self-pay | Admitting: *Deleted

## 2010-07-14 ENCOUNTER — Telehealth: Payer: Self-pay | Admitting: Internal Medicine

## 2010-07-14 ENCOUNTER — Ambulatory Visit (HOSPITAL_COMMUNITY): Admission: RE | Admit: 2010-07-14 | Discharge: 2010-07-14 | Payer: Self-pay | Admitting: Internal Medicine

## 2010-07-15 ENCOUNTER — Telehealth: Payer: Self-pay | Admitting: Internal Medicine

## 2010-07-20 ENCOUNTER — Encounter: Payer: Self-pay | Admitting: Internal Medicine

## 2010-07-23 ENCOUNTER — Ambulatory Visit (HOSPITAL_COMMUNITY)
Admission: RE | Admit: 2010-07-23 | Discharge: 2010-07-23 | Payer: Self-pay | Source: Home / Self Care | Admitting: General Surgery

## 2010-07-28 ENCOUNTER — Telehealth (INDEPENDENT_AMBULATORY_CARE_PROVIDER_SITE_OTHER): Payer: Self-pay | Admitting: *Deleted

## 2010-08-03 ENCOUNTER — Encounter: Payer: Self-pay | Admitting: Internal Medicine

## 2010-08-04 ENCOUNTER — Telehealth (INDEPENDENT_AMBULATORY_CARE_PROVIDER_SITE_OTHER): Payer: Self-pay | Admitting: *Deleted

## 2010-08-05 ENCOUNTER — Encounter: Payer: Self-pay | Admitting: Internal Medicine

## 2010-08-12 ENCOUNTER — Encounter: Admission: RE | Admit: 2010-08-12 | Discharge: 2010-08-12 | Payer: Self-pay | Admitting: Neurosurgery

## 2010-08-16 ENCOUNTER — Encounter: Payer: Self-pay | Admitting: Internal Medicine

## 2010-10-20 ENCOUNTER — Encounter
Admission: RE | Admit: 2010-10-20 | Discharge: 2010-10-20 | Payer: Self-pay | Source: Home / Self Care | Attending: Neurosurgery | Admitting: Neurosurgery

## 2010-10-27 ENCOUNTER — Telehealth: Payer: Self-pay | Admitting: Internal Medicine

## 2010-11-05 ENCOUNTER — Telehealth: Payer: Self-pay | Admitting: Internal Medicine

## 2010-11-05 ENCOUNTER — Ambulatory Visit: Admit: 2010-11-05 | Payer: Self-pay | Admitting: Internal Medicine

## 2010-11-10 ENCOUNTER — Encounter: Payer: Self-pay | Admitting: Internal Medicine

## 2010-11-12 ENCOUNTER — Ambulatory Visit
Admission: RE | Admit: 2010-11-12 | Discharge: 2010-11-12 | Payer: Self-pay | Source: Home / Self Care | Attending: Orthopedic Surgery | Admitting: Orthopedic Surgery

## 2010-11-15 LAB — GLUCOSE, CAPILLARY
Glucose-Capillary: 117 mg/dL — ABNORMAL HIGH (ref 70–99)
Glucose-Capillary: 96 mg/dL (ref 70–99)

## 2010-11-15 LAB — BASIC METABOLIC PANEL
BUN: 18 mg/dL (ref 6–23)
CO2: 27 mEq/L (ref 19–32)
Calcium: 9.9 mg/dL (ref 8.4–10.5)
Chloride: 104 mEq/L (ref 96–112)
Creatinine, Ser: 0.78 mg/dL (ref 0.4–1.2)
GFR calc Af Amer: >60 mL/min (ref >60)
GFR calc non Af Amer: >60 mL/min (ref >60)
Glucose, Bld: 98 mg/dL (ref 70–99)
Potassium: 3.5 mEq/L (ref 3.5–5.1)
Sodium: 141 mEq/L (ref 135–145)

## 2010-11-15 LAB — POCT HEMOGLOBIN-HEMACUE: Hemoglobin: 14.7 g/dL (ref 12.0–15.0)

## 2010-11-19 NOTE — Op Note (Signed)
  NAMEKEISHAWNA, CARRANZA              ACCOUNT NO.:  [id number]  MEDICAL RECORD NO.:  [id number]          PATIENT TYPE:  AMB  LOCATION:  DSC                          FACILITY:  [facility]  PHYSICIAN:  Harvie Junior, M.D.   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  PROCEDURES: 1. Chondroplasty of lateral femoral condyle down to bleeding bone. 2. Lateral retinacular release.  SURGEON:  Harvie Junior, MD  ASSISTANT:  None.  ANESTHESIA:  General.  BRIEF HISTORY:  Mr. Flock is a 50 year old female with a history of having had significant bilateral knee pain.  We treated her in the past surgically with lateral release and chondroplasty.  She has done well with that and more recently began to have increasing pain in the right knee, catching, locking, grabbing.  We would try conservative treatment with antiinflammatory medication, dietary modification, injection and therapy.  Because of failure of all this, she was taken to the operating room for right knee arthroscopy.  PROCEDURE IN DETAILS:  The patient was taken to the operating room. After adequate anesthesia was obtained with a general anesthetic, the patient was placed supine on the operating room table.  Right leg was prepped and draped in usual sterile fashion.  Following this, routine arthroscopic examination of the knee revealed there was an obvious large chondral flap and lateral femoral condyle which was debrided back to a smooth and stable rim.  Attention turned to the medial femoral condyle which had just some gentle sort of fraying that was minimally debrided. The medial and lateral meniscus were normal.  ACL  normal. Patellofemoral joint showed no severe chondromalacia, but there was definitely a tight lateral retinaculum and lateral patellar tracking and sort of a tethering especially at the inferior pole.  At this point, we took the arthroscopic Bovie and did the lateral retinacular release from the three fingerbreadths proximal to the patella down to the portal and then from the portal down to the joint line.  Patella at this point was certainly and dramatically freed up and the tracking was at midline.  At this point, the knee was copious and thoroughly lavaged, suctioned and dry.  All sterile portals were closed with a bandage after a 20 mL of 0.25% Marcaine was instilled in the knee for postoperative anesthesia.     Harvie Junior, M.D.     Ranae Plumber  D:  11/12/2010  T:  11/12/2010  Job:  [id number]  Electronically Signed by Jodi Geralds M.D. on 11/18/2010 08:59:49 PM

## 2010-11-21 ENCOUNTER — Encounter: Payer: Self-pay | Admitting: Neurosurgery

## 2010-11-22 ENCOUNTER — Encounter: Payer: Self-pay | Admitting: Gynecology

## 2010-11-25 ENCOUNTER — Encounter: Payer: Self-pay | Admitting: Internal Medicine

## 2010-11-29 ENCOUNTER — Encounter: Payer: Self-pay | Admitting: Internal Medicine

## 2010-11-30 NOTE — Progress Notes (Signed)
Summary: triage- doc of the day (new patient referral)   Phone Note From Other Clinic Call back at x [phone]   Caller: Referral Coordinator Call For: Doc of the day ---Marina Goodell Reason for Call: Schedule Patient Appt Summary of Call: Dr Sherene Sires would like this patient seen asap for non specified abd pain x 10 days. Initial call taken by: Tawni Levy,  May 20, 2010 11:46 AM  Follow-up for Phone Call        left message on machine to call back Chales Abrahams CMA Duncan Dull)  May 20, 2010 11:53 AM   Pt has had abd pain since Feb.  getting worse past 10 days, vomiting and loss of appetite.  pain worse when eating. No fever, diarrhea or constipation.  Do you want her triaged in with extender or first available with any doc.  She would be a new pt for [place]? Follow-up by: Chales Abrahams CMA Duncan Dull),  May 20, 2010 12:23 PM  Additional Follow-up for Phone Call Additional follow up Details #1::        since new patient, first open space. Could be either extender or doc. Additional Follow-up by: Hilarie Fredrickson MD,  May 20, 2010 12:42 PM    Additional Follow-up for Phone Call Additional follow up Details #2::    pt scheduled with Amy on 05/24/10  Almyra Free will contact pt Follow-up by: Chales Abrahams CMA Duncan Dull),  May 20, 2010 2:14 PM

## 2010-11-30 NOTE — Letter (Signed)
Summary: [facility]  [facility]   Imported By: Sherian Rein 04/07/2010 10:04:18  _____________________________________________________________________  External Attachment:    Type:   Image     Comment:   External Document

## 2010-11-30 NOTE — Letter (Signed)
[facility]   Imported By: Lester [place] 02/16/2010 10:51:00  _____________________________________________________________________  External Attachment:    Type:   Image     Comment:   External Document

## 2010-11-30 NOTE — Letter (Signed)
Summary: Dr Shawnie Dapper [facility]  Dr Shawnie Dapper [facility]   Imported By: Lennie Odor 07/08/2010 11:45:16  _____________________________________________________________________  External Attachment:    Type:   Image     Comment:   External Document

## 2010-11-30 NOTE — Letter (Signed)
Summary: Patient [facility]  [address] [place], [place] [address]   Phone: [phone]  Fax: [phone]        June 12, 2010 MRN: [id number]    [name] [address] [place], [place]  [address]    Dear [name],  I am pleased to inform you that the biopsies taken during your recent endoscopic examination did not show any evidence of cancer upon pathologic examination.  However, your biopsies indicate you have a condition known as Barrett's esophagus. While not cancer, it is pre-cancerous (can progress to cancer) and needs to be monitored with repeat endoscopic examination and biopsies.  Fortunately, it is quite rare that this develops into cancer, but careful monitoring of the condition along with taking your medication as prescribed is important in reducing the risk of developing cancer.  It is my recommendation that you have a repeat upper gastrointestinal endoscopic examination in 2_ years.  Additional information/recommendations:  __Please call [phone] to schedule a return visit to further      evaluate your condition.  _x_Continue with treatment plan as outlined the day of your exam.  Please call us if you have or develop heartburn, reflux symptoms, any swallowing problems, or if you have questions about your condition that have not been fully answered at this time.  Sincerely,  Hart Carwin MD  This letter has been electronically signed by your physician.  Appended Document: Patient Notice-[facility] letter mailed

## 2010-11-30 NOTE — Miscellaneous (Signed)
Summary: prescriptions for nexium and bentyl  Clinical Lists Changes  Medications: Added new medication of NEXIUM 40 MG  CPDR (ESOMEPRAZOLE MAGNESIUM) 1 capsule each day 30 minutes before meal - Signed Added new medication of BENTYL 20 MG  TABS (DICYCLOMINE HCL) 1 by mouth bid - Signed Rx of NEXIUM 40 MG  CPDR (ESOMEPRAZOLE MAGNESIUM) 1 capsule each day 30 minutes before meal;  #30 x 3;  Signed;  Entered by: Laverna Peace RN;  Authorized by: Hart Carwin MD;  Method used: [facility]  [address] [phone]*, [address], [place], [place], [place]  [id number], Ph: [phone] or [phone], Fax: [phone] Rx of BENTYL 20 MG  TABS (DICYCLOMINE HCL) 1 by mouth bid;  #60 x 1;  Signed;  Entered by: Laverna Peace RN;  Authorized by: Hart Carwin MD;  Method used: [facility]  [address] [phone]*, [address], [place], [place], [place]  [address], Ph: [phone] or [phone], Fax: [phone]    Prescriptions: BENTYL 20 MG  TABS (DICYCLOMINE HCL) 1 by mouth bid  #60 x 1   Entered by:   Laverna Peace RN   Authorized by:   Hart Carwin MD   Signed by:   Laverna Peace RN on 06/08/2010   Method used:   [facility]        [facility]  [address] [phone]* (retail)       [address]       [place], [place]  [address]       Ph: [phone] or [phone]       Fax: [phone]   RxID:   [id number] NEXIUM 40 MG  CPDR (ESOMEPRAZOLE MAGNESIUM) 1 capsule each day 30 minutes before meal  #30 x 3   Entered by:   Laverna Peace RN   Authorized by:   Hart Carwin MD   Signed by:   Laverna Peace RN on 06/08/2010   Method used:   [facility]ronically to        [facility]  [address] [phone]* (retail)       [address]       [place], [place]  [id number]       Ph: [phone] or [phone]       Fax: [phone]   RxID:   [id number]

## 2010-11-30 NOTE — Assessment & Plan Note (Signed)
Summary: Primary svc/ acute ov for n and v and d   Primary Provider/Referring Provider:  Sherene Sires  CC:  Acute visit.  Pt c/o worsening GI problems since last seen here in Feb 2011- she states that she has had vomiting and upper abd pain- worse over the past 10 days.  She states that the only thing she can eat is potatoes and yogurt.  .  History of Present Illness: 50  yowf smoker with   morbid obesity complicated by hypertension, diabetes mellitus, gastroesophageal reflux, and severe degenerative disk disease status post surgery multiple times, and hyperlipidemia    3/09 back surgery Dr Dutch Quint pain in back and legs barely tolerable, full disability.  May 07, 2009 -ov . A1C 6.2 and LDL at goal 79, TC 153. Since last visit doing better w/ decreased pain, increased sensation in hands. Husband is working out of town, may be moving to [place] for his work in next year.e  September 15, 2009-- new med calendar - Pt o chest pain and "bubbles in stomach".  She states that chest pain is sharp and dull and worsens after eating and at bedtime.  She states that she feels like "bubbles in chest".  still having occasional low grade temps up to 101.2.  States that she has had intermittent n/v/d but non in the past week.  c/o increased fluid retention more in the evening.  lab , cxr  and [place] reviewed from last visit. which  were essentially unremarkable.  rx zegerid, resolved.  December 22, 2009 49 month followup.  Pt c/o right ear pain x 2 days.  She states "head feels congested".  She also c/o hands and feet swelling and sometimes face swells.     January 25, 2010--Returns for 1 month follow up and labs. Last visit complained of fluid retention, actos stopped d/t fluid retention, started on metformin, sugars better. --avg fasting bs 120-164. dx with uti e coli rx cipro and f/u gyn u/a reported to be normal.  May 20, 2010 Acute visit.  Pt c/o worsening GI problems since 08/2009 cc  intermittents vomiting and upper  abd pain- worse over the past 10 days.  She states that the only thing she can eat is potatoes and yogurt and now not able to keep down fluids but states sugars running 80's fasting. Epigrastic discomfort has waxed and waned ,  worse with eating w/in 25 min, occ with with n/v/d which are now more persistent and pain more severe.  no radiation of pain or dysuria/ urgency. tried increase ppi to three times a day no better  Current Medications (verified): 1)  Zegerid 40-1100 Mg  Caps (Omeprazole-Sodium Bicarbonate) .... One At Bedtime 2)  Prozac 40 Mg  Caps (Fluoxetine Hcl) .... Take 1 Tablet By Mouth Once A Day 3)  Norvasc 5 Mg  Tabs (Amlodipine Besylate) .... Take 1 Tablet By Mouth Once A Day 4)  Vytorin 10-40 Mg  Tabs (Ezetimibe-Simvastatin) .... Take 1 Tablet By Mouth Once A Day 5)  Hydrochlorothiazide 25 Mg  Tabs (Hydrochlorothiazide) .... Take 1 Tablet By Mouth Once A Day 6)  Aspirin 81 Mg Tbec (Aspirin) .... Take 1 Tablet By Mouth Once A Day 7)  Calcium Carbonate-Vitamin D 600-400 Mg-Unit  Tabs (Calcium Carbonate-Vitamin D) .... Take 1 Tablet By Mouth Two Times A Day 8)  Multivitamins   Tabs (Multiple Vitamin) .... Take 1 Tablet By Mouth Once A Day 9)  Vicodin 5-500 Mg Tabs (Hydrocodone-Acetaminophen) .Marland Kitchen.. 1-2 Every 4-6 Hours As  Needed 10)  Valium 2 Mg Tabs (Diazepam) .... Take 1 Tablet By Mouth Once A Day As Needed 11)  Gas-X Extra Strength 125 Mg Caps (Simethicone) .... Take Before Meal 12)  Xanax 0.25 Mg  Tabs (Alprazolam) .... 1/2 -1 Tabs By Mouth Two Times A Day As Needed 13)  Metformin Hcl 500 Mg  Tabs (Metformin Hcl) .... One Tablet Twice Daily 14)  Onetouch Ultra Test  Strp (Glucose Blood) .... Use As Directed 15)  Klor-Con 10 10 Meq Cr-Tabs (Potassium Chloride) .... Take 1 Tablet By Mouth Once A Day  Allergies (verified): 1)  ! Penicillin 2)  ! Sulfa 3)  ! Erythromycin 4)  ! Floxin 5)  ! 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) GERD  (ICD-530.81) HYPERTENSION (ICD-401.9) DEGENERATIVE DISC DISEASE (ICD-722.6) DIABETES MELLITUS (ICD-250.00 HYPERLIPIDEMIA (ICD-272.4) SYNCOPE (ICD-780.2) OBESITY (ICD-278.00)    -peak all-time weight 242 December 22, 2009    - target to get BMI < 30 = 179   - referral to nutrition 05/19/2008 > refer again December 22, 2009  Depression Anxiety Right Upper quadrant pain     - onset 08/31/2009     - ABD u/s September 08, 2009 > neg     - refer to GI  May 20, 2010 / try off ppi and metformin Health Maintenance.......................................................Marland KitchenWert      - [facility]  Vital Signs:  Patient profile:   50 year old female Weight:      225 pounds O2 Sat:      98 % on Room air Temp:     98.6 degrees F oral Pulse rate:   85 / minute BP sitting:   122 / 80  (left arm)  Vitals Entered By: Vernie Murders (May 20, 2010 11:08 AM)  O2 Flow:  Room air  Physical Exam  Additional Exam:  GEN: A/Ox3; pleasant , NAD wt 237 > 242 December 22, 2009 >>238 January 25, 2010 > 225 May 20, 2010  HEENT:  Ayr/AT, , EACs-clear, TMs-wnl, NOSE-clear, THROAT-clear NECK:  Supple w/ fair ROM; no JVD; normal carotid impulses w/o bruits; no thyromegaly or nodules palpated; no lymphadenopathy. RESP  Clear to P & A; w/o, wheezes/ rales/ or rhonchi. CARD:  RRR, no m/r/g   GI:   Soft & sl. tender  epigastric area,; nml bowel sounds; no organomegaly or masses detected, no gurading or rebound.  Musco: Warm bil,  no calf tenderness edema, clubbing, pulses intact    Sodium                    145 mEq/L                   135-145   Potassium                 3.9 mEq/L                   3.5-5.1   Chloride                  106 mEq/L                   96-112   Carbon Dioxide            31 mEq/L                    19-32   Glucose              [  H]  123 mg/dL                   16-10   BUN                       12 mg/dL                    9-60   Creatinine                0.7 mg/dL                   4.5-4.0    Calcium                   9.9 mg/dL                   9.8-11.9   GFR                       103.36 mL/min               >60  Tests: (2) CBC Platelet w/Diff (CBCD)   White Cell Count          9.8 K/uL                    4.5-10.5   Red Cell Count            4.17 Mil/uL                 3.87-5.11   Hemoglobin                13.9 g/dL                   14.7-82.9   Hematocrit                39.9 %                      36.0-46.0   MCV                       95.6 fl                     78.0-100.0   MCHC                      34.8 g/dL                   56.2-13.0   RDW                       12.7 %                      11.5-14.6   Platelet Count            324.0 K/uL                  150.0-400.0   Neutrophil %              65.3 %                      43.0-77.0   Lymphocyte %              22.9 %  12.0-46.0   Monocyte %                5.3 %                       3.0-12.0   Eosinophils%         [H]  5.7 %                       0.0-5.0   Basophils %               0.8 %                       0.0-3.0   Neutrophill Absolute      6.4 K/uL                    1.4-7.7   Lymphocyte Absolute       2.2 K/uL                    0.7-4.0   Monocyte Absolute         0.5 K/uL                    0.1-1.0  Eosinophils, Absolute                             0.6 K/uL                    0.0-0.7   Basophils Absolute        0.1 K/uL                    0.0-0.1  Tests: (3) Hepatic/Liver Function Panel (HEPATIC)   Total Bilirubin           0.5 mg/dL                   0.9-8.1   Direct Bilirubin          0.1 mg/dL                   1.9-1.4   Alkaline Phosphatase      72 U/L                      39-117   AST                       33 U/L                      0-37   ALT                       34 U/L                      0-35   Total Protein             7.4 g/dL                    7.8-2.9   Albumin                   4.7 g/dL                    5.6-2.1  Tests: (4)  Amylase (AMYL)   Amylase                   28 U/L                       27-131  Tests: (5) Sed Rate (ESR)   Sed Rate                  8 mm/hr                     0-22  Tests: (6) Lipase (LIPASE)   Lipase                    13.0 U/L                    11.0-59.0  Tests: (7) UDip w/Micro (URINE)   Color                     YELLOW       RANGE:  Yellow;Lt. Yellow   Clarity                   CLEAR                       Clear   Specific Gravity          >=1.030                     1.000 - 1.030   Urine Ph                  5.0                         5.0-8.0   Protein                   TRACE                       Negative   Urine Glucose             NEGATIVE                    Negative   Ketones                   TRACE                       Negative   Urine Bilirubin           SMALL                       Negative   Blood                     SMALL                       Negative   Urobilinogen              1.0                         0.0 - 1.0   Leukocyte Esterace        TRACE  Negative   Nitrite                   NEGATIVE                    Negative   Urine WBC                 3-6/hpf                     0-2/hpf   Urine RBC                 0-2/hpf                     0-2/hpf   Urine Mucus               Presence of                 None   Urine Epith               Few(5-10/hpf)               Rare(0-4/hpf)   Urine Bacteria            Rare(<10/hpf)               None  Impression & Recommendations:  Problem # 1:  ABDOMINAL PAIN, UNSPECIFIED (ICD-789.00) Non specific pattern no longer responding to ppi dosed up to tid  in pt with dm ? rxn to ppi or metformin - try off both in short run. 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 Patient Level IV (69629)  Problem # 3:  DIABETES MELLITUS (ICD-250.00)  The following medications were  removed from the medication list:    Metformin Hcl 500 Mg Tabs (Metformin hcl) ..... One tablet twice daily Her updated medication list for this problem includes:    Aspirin 81 Mg Tbec (Aspirin) .Marland Kitchen... Take 1 tablet by mouth once a day  Will monitor sugars off rx until next ov as long as fasting blood sugar < 200   Orders: Est. Patient Level IV (52841)  Medications Added to Medication List This Visit: 1)  Pepcid Ac Maximum Strength 20 Mg Tabs (Famotidine) .... One after bfast and supper 2)  Promethazine Hcl 25 Mg Tabs (Promethazine hcl) .... One every 4 hour for nausea  Other Orders: Gastroenterology Referral (GI) TLB-BMP (Basic Metabolic Panel-BMET) (80048-METABOL) TLB-CBC Platelet - w/Differential (85025-CBCD) TLB-Hepatic/Liver Function Pnl (80076-HEPATIC) TLB-Amylase (82150-AMYL) TLB-Udip ONLY (81003-UDIP) TLB-Sedimentation Rate (ESR) (85652-ESR) TLB-Lipase (83690-LIPASE)  Patient Instructions: 1)  See Patient Care Coordinator before leaving for GI referral asap 2)  stop zegerid and the metformin 3)  start pepcid ac 20 mg after bfast and supper 4)  phenergan every 4 hours for nausea 5)  Clear liquids = gingale ale, broth, ice tea, gatorade then add noodles rice and crackles avoid dairy products and fresh veggies/salads 6)  if condition worsens go to ER Prescriptions: PROMETHAZINE HCL 25 MG TABS (PROMETHAZINE HCL) one every 4 hour for nausea  #16 x 11   Entered and Authorized by:   Nyoka Cowden MD   Signed by:   Nyoka Cowden MD on 05/20/2010   Method used:   [facility]  [address] [phone]* (retail)       [address]       [place], [place]  [address]       Ph: [phone] or [phone]       Fax: [phone]   RxID:   [id number]

## 2010-11-30 NOTE — Assessment & Plan Note (Signed)
Summary: worsening abd pain/pl    History of Present Illness Visit Type: Initial Consult Primary GI MD: Lina Sar MD Primary Provider: Sandrea Hughs, MD Requesting Provider: Sandrea Hughs, MD Chief Complaint: Patient complains of epigastric pain since November and she was changed to a different diabetic medication. she is having constant diarrhea and some vomiting which she has lost 13 lbs within 9-10 days. She has been on clear liquids since she last seen Dr.Wert she started adding bland foods on yesterday. She the diarrhea and vomiting comes within 30 mins of eating or drinking. She states that the abdominal pain is at a 10 on the pain scale from time to time.   History of Present Illness:   [name] 50 Y.O FEMALE NEW TO G.I TODAY.. SHE IS REFERRED PER DR. Sherene Owens FOR C/O EPIGASTRIC PAIN ONSET NOV.2011. THIS HAS BEEN ASSOCIATED WITH MORE RECENT NAUSEA,DIARRHEA AND INTERMITTENT VOMINING OVER THE PAST COUPLE WEEKS. SHE DESCRIBES HER PAIN AS EPIGASTRIC,SHARP,CONSTANT BUT VARYING IN INTENSITY. SHE HAD STARTED METFORMIN LAST FALL BUT SAYS THE PAIN WAS THERE BEFORE THAT. SHE SAW DR. Sherene Owens LAST WEEK WHEN SHE GOT WORSE ,HE STOPPED SEVERAL MEDS-INCLUDING THE METFORMIN. SHE HAS BEEN EATING VERY BLAND-FEELS MUCH WORSE WHEN SHE EATS ANY GREENS.WEIGHT DOWN 13 POUNDS OVER THE PAST COUPLE WEEKS. SHE REPORTS VOMITING WITHIN 30 MINUTES OF A MEAL.NO FEVERS. STOOLS ARE LOOSE,3-4 BM/DAY, NO MELENA OR HEME.NO HEARTBURN ,INDIGSTION OR DYSPHAGIA. SHE HAD BEEN ON ZEGERID BRIEFLY-ALSO STOPPED LAST WEEK.   OF NOTE SHE HAS CHRONIC BACK PAIN AND HAS BEEN TAKING UP TO 12 ADVIL PER DAY TRYING TO AVOID USING TOO MUCH VICODIN!.  NO REAL IMPROVEMENT IN SXS SINCE MEDS HELD THESE PAST 5-6 DAYS.  ABDOMINAL US -11/10-NEGATIVE EXCEPT FOR HEPATIC STEATOSIS.   LABS;7/11CBC,CMET, LIPASE,ESR ALL WNL.   GI Review of Systems    Reports abdominal pain, acid reflux, belching, bloating, chest pain, nausea, vomiting, and  weight loss.     Location  of  Abdominal pain: epigastric area. Weight loss of 13 pounds over 9-10 days.   Denies dysphagia with liquids, dysphagia with solids, heartburn, loss of appetite, vomiting blood, and  weight gain.      Reports diarrhea.     Denies anal fissure, black tarry stools, change in bowel habit, constipation, diverticulosis, fecal incontinence, heme positive stool, hemorrhoids, irritable bowel syndrome, jaundice, light color stool, liver problems, rectal bleeding, and  rectal pain.    Current Medications (verified): 1)  Prozac 40 Mg  Caps (Fluoxetine Hcl) .... Take 1 Tablet By Mouth Once A Day 2)  Norvasc 5 Mg  Tabs (Amlodipine Besylate) .... Take 1 Tablet By Mouth Once A Day 3)  Vytorin 10-40 Mg  Tabs (Ezetimibe-Simvastatin) .... Take 1 Tablet By Mouth Once A Day 4)  Hydrochlorothiazide 25 Mg  Tabs (Hydrochlorothiazide) .... Take 1 Tablet By Mouth Once A Day 5)  Vicodin 5-500 Mg Tabs (Hydrocodone-Acetaminophen) .Marland Kitchen.. 1-2 Every 4-6 Hours As Needed 6)  Valium 2 Mg Tabs (Diazepam) .... Take 1 Tablet By Mouth Once A Day As Needed 7)  Onetouch Ultra Test  Strp (Glucose Blood) .... Use As Directed 8)  Promethazine Hcl 25 Mg Tabs (Promethazine Hcl) .... Take 1/4 By Mouth As Needed  Allergies (verified): 1)  ! Penicillin 2)  ! Sulfa 3)  ! Erythromycin 4)  ! Floxin 5)  ! * Latex  Past History:  Past Medical History: Reviewed history from 05/20/2010 and no changes required. 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) GERD (ICD-530.81) HYPERTENSION (ICD-401.9) DEGENERATIVE DISC DISEASE (ICD-722.6) DIABETES MELLITUS (  ICD-250.00 HYPERLIPIDEMIA (ICD-272.4) SYNCOPE (ICD-780.2) OBESITY (ICD-278.00)    -peak all-time weight 242 December 22, 2009    - target to get BMI < 30 = 179   - referral to nutrition 05/19/2008 > refer again December 22, 2009  Depression Anxiety Right Upper quadrant pain     - onset 08/31/2009     - ABD u/s September 08, 2009 > neg     - refer to GI  May 20, 2010 / try off ppi and  metformin Health Maintenance.......................................................Marland KitchenWert      - [facility]  Past Surgical History: s/p neck and back surgery Appendectomy Hysterectomy  Family History: hypertension father ischemic heart disease, mother, bypass at age 40 allergies and asthma in sister gallbladder dz in mother Family History of Colon Polyps: father mother had rectovagnal fistual  Social History: active smoker-currently trying to quit but not successful yet. full disability since March 2009 due to back pain Married,  2 kids.  Daily Caffeine Use 1 per day Alcohol Use - no  Review of Systems       The patient complains of back pain, blood in urine, night sweats, sore throat, swelling of feet/legs, urination - excessive, and urine leakage.  The patient denies allergy/sinus, anemia, anxiety-new, arthritis/joint pain, breast changes/lumps, change in vision, confusion, cough, coughing up blood, depression-new, fainting, fatigue, fever, headaches-new, hearing problems, heart murmur, heart rhythm changes, itching, menstrual pain, muscle pains/cramps, nosebleeds, pregnancy symptoms, shortness of breath, skin rash, sleeping problems, swollen lymph glands, thirst - excessive , urination - excessive , urination changes/pain, vision changes, and voice change.         ROS OTHERWISE AS IN HPI  Vital Signs:  Patient profile:   50 year old female Height:      66 inches Weight:      224.4 pounds BMI:     36.35 Pulse rate:   86 / minute Pulse rhythm:   regular BP sitting:   128 / 82  (left arm) Cuff size:   regular  Vitals Entered By: Harlow Mares CMA Duncan Dull) (May 24, 2010 1:51 PM)  Physical Exam  General:  Well developed, well nourished, no acute distress. Head:  Normocephalic and atraumatic. Eyes:  PERRLA, no icterus. Lungs:  Clear throughout to auscultation. Heart:  Regular rate and rhythm; no murmurs, rubs,  or bruits. Abdomen:  SOFT, TENDER EPIGASTRIUM, NO GUARDING,  NO MASS OR HSM,BS+ Rectal:  HEME NEGATIVE 2 WEEKS AGO AT GYN,PT DECLINED Extremities:  No clubbing, cyanosis, edema or deformities noted. Neurologic:  Alert and  oriented x4;  grossly normal neurologically. Psych:  Alert and cooperative. Normal mood and affect.   Impression & Recommendations:  Problem # 1:  ABDOMINAL PAIN-EPIGASTRIC (ICD-789.06) Assessment New 49 YO WITH PERSISTENT EPIGASTRIC PAIN X 9 MONTHS,NAUSEA,DIARRHEA-NOW PROGRESSING WITH NAUSEA/VOMITING,AND WEIGHT LOSS. ETIOLOGY NOT CLEAR. SUSPECT NSAID INDUCED  GASTROPATHY/PUD,AND WONDER ABOUT AN NSAID INDUCED ENTEROPATHY OR COLITIS GIVEN LONG TERM HIGH DOSE USE.  STOP NSAIDS. START NEXIUM 40 MG TWICE DAILY SCHEDULE FOR UPPER ENDOSCOPY AND COLONOSCOPY WITH DR. Juanda Chance. PROCEDURES DISCUSSED WITH PT IN DETAIL AND SHE IS AGREEABLE. CONTINUE OFF METFORMIN WHICH SURELY IS ASSOCIATED WITH MULTIPLE GI SIDE EFFECTS. PT WILL CONTACT DR. Rolin Barry OFFICE FOR ALTERNATE DIABETIC RX. SOFT, BLAND DIET.  Problem # 2:  FATTY LIVER DISEASE (ICD-571.8) Assessment: Unchanged DISCUSSED WEIGHT LOSS,LOW CHOLESTEROL DIET WITH PT  Problem # 3:  DIABETES MELLITUS (ICD-250.00) Assessment: Comment Only  Problem # 4:  DEGENERATIVE DISC DISEASE (ICD-722.6) Assessment: Comment Only CHRONIC BACK PAIN /  CHRONIC  PAIN MED USE  Other Orders: Colon/Endo (Colon/Endo)  Patient Instructions: 1)  Copy sent to : Sandrea Hughs, MD 2)  Take Nexium two times a day once before breakfast and once before dinner 3)  Stop taking all Anti-Inflammatories 4)  We are scheduling you for a colonoscopy/Endoscopy with Dr Juanda Chance 5)  The medication list was reviewed and reconciled.  All changed / newly prescribed medications were explained.  A complete medication list was provided to the patient / caregiver. Prescriptions: DULCOLAX 5 MG  TBEC (BISACODYL) Day before procedure take 2 at 3pm and 2 at 8pm.  #4 x 0   Entered by:   Merri Ray CMA (AAMA)   Authorized by:   Sammuel Cooper PA-c   Signed by:   Merri Ray CMA (AAMA) on 05/24/2010   Method used:   Electronically to        [facility]  [address] [phone]* (retail)       [address]       [place], [place]  [id number]       Ph: [phone] or [phone]       Fax: [phone]   RxID:   [id number] REGLAN 10 MG  TABS (METOCLOPRAMIDE HCL) As per prep instructions.  #2 x 0   Entered by:   Merri Ray CMA (AAMA)   Authorized by:   Sammuel Cooper PA-c   Signed by:   Merri Ray CMA (AAMA) on 05/24/2010   Method used:   [facility] to        [facility]  [address] [phone]* (retail)       [address]       [place], [place]  [id number]       Ph: [phone] or [phone]       Fax: [phone]   RxID:   [id number] MIRALAX   POWD (POLYETHYLENE GLYCOL 3350) As per prep  instructions.  #255gm x 0   Entered by:   Merri Ray CMA (AAMA)   Authorized by:   Sammuel Cooper PA-c   Signed by:   Merri Ray CMA (AAMA) on 05/24/2010   Method used:   [facility]        [facility]  [address] [phone]* (retail)       [address]       [place], [place]  [id number]       Ph: [phone] or [phone]       Fax: [phone]   RxID:   [id number]

## 2010-11-30 NOTE — Progress Notes (Signed)
Summary: What is Barretts   Phone Note Call from Patient Call back at Home Phone [phone]   Call For: Dr Juanda Chance Reason for Call: Talk to Nurse Summary of Call: Received a letter that says she has Barretts Esophagus and would like someone to explain to her what that is. Initial call taken by: Leanor Kail Rush [facility],  June 22, 2010 1:19 PM  Follow-up for Phone Call        Reviewed with the patient barrett'e the improtance of staying on a PPI and will need to be screened again in 2 years.  All questions answered. Follow-up by: Darcey Nora RN, CGRN,  June 22, 2010 1:34 PM

## 2010-11-30 NOTE — Letter (Signed)
Summary: Attending Physician's Statement/[employer]  Attending Physician's Statement/[employer]   Imported By: Sherian Rein 08/06/2010 09:07:13  _____________________________________________________________________  External Attachment:    Type:   Image     Comment:   External Document

## 2010-11-30 NOTE — Progress Notes (Signed)
Summary: Insurance form    Phone Note Call from Patient Call back at [employer] [phone]   Caller: Mom-[place] crockett Call For: wert Summary of Call: pt's mom dropped off a form: attending physician's statement- to be completed by dr wert. this is re: pt's travel ins/ trip was cancelled while under dr wert's care per mom. (i called [employer] and was told that this does not go down there since it isn't a disability form). mom wants this maile to pt or faxed (but you will have to call pt for fax # (pt's personal fax). mom- virginia [phone] or [phone]. pt may be reached at home #. paper given to Aundra Millet (chan delivering now to mr in triage) Initial call taken by: Tivis Ringer, CNA,  July 28, 2010 2:29 PM  Follow-up for Phone Call        I have forms and will give to [facility] tomorrow morning when she is back at work. Arman Filter LPN  July 28, 2010 3:22 PM   Additional Follow-up for Phone Call Additional follow up Details #1::        I will place form in [facility] lookat, but I think that it may make more since for GI to fill this out.  Will forward to MW. Pls advise thanks\par If it pertains to details beyond 7/25, when I turned her over to GI then GI will need to complete If it pertains to details after she was sent to a surgeon following GI eval, then surgery will need to fill it out Additional Follow-up by: Nyoka Cowden MD,  July 29, 2010 6:35 PM    Additional Follow-up for Phone Call Additional follow up Details #2::    ATC pt at home #.  [id number].  Also ATC pt's mother at the 2 #s she left and had to LM at both of these #s.  Aundra Millet Reynolds LPN  July 30, 2010 9:42 AM  MOM VIR[place] CROCKETT RETURNED CALL FROM [place]. CALL [phone]- MUST DIAL THE AREA CODE W/ NUMBER. Tivis Ringer, CNA  July 30, 2010 10:11 AM  [id number].  Aundra Millet Reynolds LPN  July 30, 2010 10:17 AM   called and spoke with pt's mother.  Mother states letter pertains to  information before pt was seen by GI or Surgeon.  She states they had cancelled trip prior to being evaluated by GI on 7/25.  Will forward message back to MW to address.  Aundra Millet Reynolds LPN  July 30, 2010 10:36 AM       Additional Follow-up for Phone Call Additional follow up Details #3:: Details for Additional Follow-up Action Taken: Form was completed by MW and spoke with pt and made aware.  She will pick this up this wk per her request, form was placed up front. Additional Follow-up by: Vernie Murders,  August 03, 2010 4:28 PM

## 2010-11-30 NOTE — Assessment & Plan Note (Signed)
Summary: NEW/CIG[employer]/ DM / MAY NEED REFERRAL TO [facility]   Vital Signs:  Patient profile:   50 year old female Height:      66.5 inches (168.91 cm) Weight:      225.2 pounds (102.36 kg) O2 Sat:      98 % on Room air Temp:     99.2 degrees F (37.33 degrees C) oral Pulse rate:   60 / minute BP sitting:   122 / 82  (left arm) Cuff size:   large  Vitals Entered By: Orlan Leavens RMA (June 10, 2010 8:23 AM)  O2 Flow:  Room air CC: New patient Is Patient Diabetic? Yes Did you bring your meter with you today? No Pain Assessment Patient in pain? no        Primary Care Provider:  Newt Lukes MD  CC:  New patient.  History of Present Illness: new pt to our division - here to est care - prev has followed with [facility] for PC needs  1) DM2 - currently with hyper and hypo glycemic episodes and symptoms since starting glimepiride - prev cbgs better controlled on metformin but meds changed due to stomach issues and abd pain - pt  pain and GI symptoms have not changed since being off metformin - checks sugars 2x/d and as needed   2) HTN- reports compliance with ongoing medical treatment and no changes in medication dose or frequency. denies adverse side effects related to current therapy. no CP or edema, no HA or weakness  3) dyslipidemia - reports compliance with ongoing medical treatment and no changes in medication dose or frequency. denies adverse side effects related to current therapy. no GI or Mskel pain/prob  4) GERD with recent NSAID induced gastritis pain symptoms - ongoing GI eval for same; s/p EGD/colo 06/08/10 - on PPI and trying to reduce ibuprofen use - ?if alt NSAID type med that can help control pain symptoms to minimize narc use but not aggrevate stomach? planning GES study in near future  5) chronic low back pain - DDD - s/p surg for same 3/09 - full disablity - recieves freq steroid injections for same (>3/yr) and uses vicodin as needed (pain meds mgmt by  Nsurg-[facility])  Preventive Screening-Counseling & Management  Alcohol-Tobacco     Smoking Status: current     Packs/Day: 3 cigs a day     Tobacco Counseling: to quit use of tobacco products  Caffeine-Diet-Exercise     Depression Counseling: not indicated; screening negative for depression  Safety-Violence-Falls     Seat Belt Counseling: not indicated; patient wears seat belts     Helmet Counseling: not indicated; patient wears helmet when riding bicycle/motocycle     Violence Counseling: not indicated; no violence risk noted  Clinical Review Panels:  Prevention   Last Mammogram:  [facility].   No specific mammographic evidence of malignancy.  Assessment: BIRADS 2. (02/11/2010)   Last Pap Smear:  Interpretation Result:Negative for intraepithelial Lesion or Malignancy.    (05/31/2009)   Last Colonoscopy:  DONE (06/08/2010)  Immunizations   Last Tetanus Booster:  Historical (11/01/2007)  Lipid Management   Cholesterol:  128 (01/27/2010)   LDL (bad choesterol):  52 (01/27/2010)   HDL (good cholesterol):  40.30 (01/27/2010)  Diabetes Management   HgBA1C:  6.2 (01/27/2010)   Creatinine:  0.7 (05/20/2010)  CBC   WBC:  9.8 (05/20/2010)   RBC:  4.17 (05/20/2010)   Hgb:  13.9 (05/20/2010)   Hct:  39.9 (05/20/2010)  Platelets:  324.0 (05/20/2010)   MCV  95.6 (05/20/2010)   MCHC  34.8 (05/20/2010)   RDW  12.7 (05/20/2010)   PMN:  65.3 (05/20/2010)   Lymphs:  22.9 (05/20/2010)   Monos:  5.3 (05/20/2010)   Eosinophils:  5.7 (05/20/2010)   Basophil:  0.8 (05/20/2010)  Complete Metabolic Panel   Glucose:  123 (05/20/2010)   Sodium:  145 (05/20/2010)   Potassium:  3.9 (05/20/2010)   Chloride:  106 (05/20/2010)   CO2:  31 (05/20/2010)   BUN:  12 (05/20/2010)   Creatinine:  0.7 (05/20/2010)   Albumin:  4.7 (05/20/2010)   Total Protein:  7.4 (05/20/2010)   Calcium:  9.9 (05/20/2010)   Total Bili:  0.5 (05/20/2010)   Alk Phos:  72  (05/20/2010)   SGPT (ALT):  34 (05/20/2010)   SGOT (AST):  33 (05/20/2010)   Current Medications (verified): 1)  Prozac 40 Mg  Caps (Fluoxetine Hcl) .... Take 1 Tablet By Mouth Once A Day 2)  Norvasc 5 Mg  Tabs (Amlodipine Besylate) .... Take 1 Tablet By Mouth Once A Day 3)  Vytorin 10-40 Mg  Tabs (Ezetimibe-Simvastatin) .... Take 1 Tablet By Mouth Once A Day 4)  Hydrochlorothiazide 25 Mg  Tabs (Hydrochlorothiazide) .... Take 1 Tablet By Mouth Once A Day 5)  Vicodin 5-500 Mg Tabs (Hydrocodone-Acetaminophen) .Marland Kitchen.. 1-2 Every 4-6 Hours As Needed 6)  Valium 2 Mg Tabs (Diazepam) .... Take 1 Tablet By Mouth Once A Day As Needed 7)  Onetouch Ultra Test  Strp (Glucose Blood) .... Use As Directed 8)  Promethazine Hcl 25 Mg Tabs (Promethazine Hcl) .... Take 1/4 By Mouth As Needed 9)  Amaryl 2 Mg Tabs (Glimepiride) .... Take One Tablet By Mouth Two Times A Day 10)  Nexium 40 Mg  Cpdr (Esomeprazole Magnesium) .Marland Kitchen.. 1 Capsule Each Day 30 Minutes Before Meal 11)  Bentyl 20 Mg  Tabs (Dicyclomine Hcl) .Marland Kitchen.. 1 By Mouth Bid  Allergies (verified): 1)  ! Penicillin 2)  ! Sulfa 3)  ! Erythromycin 4)  ! Floxin 5)  ! * Latex  Past History:  Past Medical History: INTERSTITIAL CYSTITIS GERD  HYPERTENSION DEGENERATIVE DISC DISEASE  DIABETES MELLITUS HYPERLIPIDEMIA  OBESITY     -peak all-time weight 242 December 22, 2009    - target to get BMI < 30 = 179   - referral to nutrition 05/19/2008 > refer again December 22, 2009  Depression Anxiety Right Upper quadrant pain     - onset 08/31/2009     - ABD u/s September 08, 2009 > neg     - refer to GI  May 20, 2010 / try off ppi and metformin   -EGD/colo 06/08/10>gastritis and HH   MD roster: gyn -mezer Nsurg - [facility] GI - Psychologist, counselling  Past Surgical History: s/p neck and back surgery 12/2007 Appendectomy Hysterectomy   Family History: hypertension father  ischemic heart disease, mother, bypass at age 77 allergies and asthma in sister gallbladder dz in  mother Family History of Colon Polyps: father mother had rectovagnal fistual  Social History: active smoker-currently trying to quit but not successful yet. 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  Daily Caffeine Use 1 per day Alcohol Use - no  Review of Systems       see HPI above. I have reviewed all other systems and they were negative.   Physical Exam  General:  alert, well-developed, well-nourished, and cooperative to examination.    Eyes:  vision grossly intact; pupils equal, round and reactive to light.  conjunctiva and lids normal.    Ears:  normal pinnae bilaterally, without erythema, swelling, or tenderness to palpation. TMs clear, without effusion, or cerumen impaction. Hearing grossly normal bilaterally  Mouth:  teeth and gums in good repair; mucous membranes moist, without lesions or ulcers. oropharynx clear without exudate, no erythema.  Lungs:  normal respiratory effort, no intercostal retractions or use of accessory muscles; normal breath sounds bilaterally - no crackles and no wheezes.    Heart:  normal rate, regular rhythm, no murmur, and no rub. BLE without edema. normal DP pulses and normal cap refill in all 4 extremities    Abdomen:  soft, non-tender, normal bowel sounds, no distention; no masses and no appreciable hepatomegaly or splenomegaly.   Msk:  No deformity or scoliosis noted of thoracic or lumbar spine.   Neurologic:  alert & oriented X3 and cranial nerves II-XII symetrically intact.  strength normal in all extremities, sensation intact to light touch, and gait normal. speech fluent without dysarthria or aphasia; follows commands with good comprehension.  Skin:  no rashes, vesicles, ulcers, or erythema. No nodules or irregularity to palpation.  Psych:  Oriented X3, memory intact for recent and remote, normally interactive, good eye contact, not anxious appearing, not  depressed appearing, and not agitated.      Impression & Recommendations:  Problem # 1:  DIABETES MELLITUS (ICD-250.00)  had been well controlled on metformin until 08/2009 - med changes made due to GI symptoms -  however, GI symptoms not improved/changed AND now with hypoglycemic events and marked variabilty in cbg readings will use glimepiride only as needed steroid injections (or when cbs >200)  also try resumed metformin - prefer ext release to minimize poss GI symptoms  check a1c now and again 3 mo to monitor, pt to call sooner if GI probs worse due to metformin restart Her updated medication list for this problem includes:    Amaryl 2 Mg Tabs (Glimepiride) ..... Hold unless sugar >200 - if over 200, take one tablet by mouth two times a day    Metformin Hcl 500 Mg Xr24h-tab (Metformin hcl) .Marland Kitchen... 1 by mouth once daily x 7 days, then increase to 1 by mouth two times a day (or as instructed)  Orders: TLB-A1C / Hgb A1C (Glycohemoglobin) (83036-A1C) Prescription Created Electronically [phone])  Labs Reviewed: Creat: 0.7 (05/20/2010)    Reviewed HgBA1c results: 6.2 (01/27/2010)  6.1 (09/08/2009)  Problem # 2:  ABDOMINAL PAIN-EPIGASTRIC (ICD-789.06)  s/p EGD/colo 06/08/10 and GI eval for same - +evidence NSAID gastritis, mild - on trial PPI and  bentyl for spasm - ?planning GES for n/v component - r/o gastroparesis - per GI  The following medications were removed from the medication list:    Reglan 10 Mg Tabs (Metoclopramide hcl) .Marland Kitchen... As per prep instructions.  Discussed symptom control with the patient.   Problem # 3:  DEGENERATIVE DISC DISEASE (ICD-722.6)  chronic back pain and chronic narc use for same - multiple steroid injections thru the year - all symptoms and meds for this managed by her nsurg - [facility] - cont same will however try meloxicam in place of ibuprofen use due see if this can reduce NSAID GI ditress - new erx done  Orders: Prescription Created Electronically  [phone])  Problem # 4:  HYPERTENSION (ICD-401.9)  Her updated medication list for this problem includes:  Norvasc 5 Mg Tabs (Amlodipine besylate) .Marland Kitchen... Take 1 tablet by mouth once a day    Hydrochlorothiazide 25 Mg Tabs (Hydrochlorothiazide) .Marland Kitchen... Take 1 tablet by mouth once a day  BP today: 122/82 Prior BP: 128/82 (05/24/2010)  Labs Reviewed: K+: 3.9 (05/20/2010) Creat: : 0.7 (05/20/2010)   Chol: 128 (01/27/2010)   HDL: 40.30 (01/27/2010)   LDL: 52 (01/27/2010)   TG: 181.0 (01/27/2010)  Complete Medication List: 1)  Prozac 40 Mg Caps (Fluoxetine hcl) .... Take 1 tablet by mouth once a day 2)  Norvasc 5 Mg Tabs (Amlodipine besylate) .... Take 1 tablet by mouth once a day 3)  Vytorin 10-40 Mg Tabs (Ezetimibe-simvastatin) .... Take 1 tablet by mouth once a day 4)  Hydrochlorothiazide 25 Mg Tabs (Hydrochlorothiazide) .... Take 1 tablet by mouth once a day 5)  Vicodin 5-500 Mg Tabs (Hydrocodone-acetaminophen) .Marland Kitchen.. 1-2 every 4-6 hours as needed 6)  Valium 2 Mg Tabs (Diazepam) .... Take 1 tablet by mouth once a day as needed 7)  Onetouch Ultra Test Strp (Glucose blood) .... Use as directed 8)  Promethazine Hcl 25 Mg Tabs (Promethazine hcl) .... Take 1/4 by mouth as needed 9)  Amaryl 2 Mg Tabs (Glimepiride) .... Hold unless sugar >200 - if over 200, take one tablet by mouth two times a day 10)  Nexium 40 Mg Cpdr (Esomeprazole magnesium) .Marland Kitchen.. 1 capsule each day 30 minutes before meal 11)  Bentyl 20 Mg Tabs (Dicyclomine hcl) .Marland Kitchen.. 1 by mouth two times a day 12)  Metformin Hcl 500 Mg Xr24h-tab (Metformin hcl) .Marland Kitchen.. 1 by mouth once daily x 7 days, then increase to 1 by mouth two times a day (or as instructed) 13)  Meloxicam 15 Mg Tabs (Meloxicam) .Marland Kitchen.. 1 by mouth once daily  Patient Instructions: 1)  it was good to see you today. 2)  put glimepiride on hold unless sugar over 200 as discussed - 3)  retry metformin XR as discussed 4)  meloxicam in place of ibuprofen as discussed - 5)  your  prescriptions have been electronically submitted to your pharmacy. Please take as directed. Contact our office if you believe you're having problems with the medication(s).  6)  test(s) ordered today - your results will be posted on the phone tree for review in 48-72 hours from the time of test completion; call [phone] and enter your 9 digit MRN (listed above on this page, just below your name); if any changes need to be made or there are abnormal results, you will be contacted directly.  7)  Please schedule a follow-up appointment in 3 months to review, sooner if problems.  Prescriptions: MELOXICAM 15 MG TABS (MELOXICAM) 1 by mouth once daily  #30 x 3   Entered and Authorized by:   Newt Lukes MD   Signed by:   Newt Lukes MD on 06/10/2010   Method used:   [facility]  [address] [phone][facility] (retail)       [address]       [place], [place]  [id number]       Ph: [phone] or [phone]       Fax: [phone]   RxID:   [id number] METFORMIN HCL 500 MG XR24H-TAB (METFORMIN HCL) 1 by mouth once daily x 7 days, then increase to 1 by mouth two times a day (or as instructed)  #60 x 3   Entered and Authorized  by:   Newt Lukes MD   Signed by:   Newt Lukes MD on 06/10/2010   Method used:   [facility]        [facility]  [address] [phone][facility] (retail)       [address]       [place], [place]  [address]       Ph: [phone] or [phone]       Fax: [phone]   RxID:   [id number]    Mammogram  Procedure date:  02/11/2010  Findings:      Location: [facility].   No specific mammographic evidence of malignancy.  Assessment: BIRADS 2.   Pap Smear  Procedure date:  05/31/2009  Findings:      Interpretation Result:Negative for intraepithelial Lesion or Malignancy.        Immunization History:  Tetanus/Td Immunization History:     Tetanus/Td:  historical (11/01/2007)

## 2010-11-30 NOTE — Letter (Signed)
Summary: [facility]   Imported By: Lester [place] 08/05/2010 09:36:18  _____________________________________________________________________  External Attachment:    Type:   Image     Comment:   External Document

## 2010-11-30 NOTE — Progress Notes (Signed)
Summary: REQ REFERRAL ASAP  Phone Note Call from Patient   Summary of Call: Pt just walked into office. INformed her of HIDA scan results. She would like surgeon referral asap. Pt vomitied x 2 while at our office this evening. She has promethazine at home. I advised she go to ER w/fever or severe symptoms, she agreed.  Initial call taken by: Lamar Sprinkles, CMA,  July 14, 2010 5:57 PM  Follow-up for Phone Call        noted & agree - will f/u [facility] on referral status Follow-up by: Newt Lukes MD,  July 15, 2010 5:39 AM  Additional Follow-up for Phone Call Additional follow up Details #1::        Pt was notified. (see other phone note 07/15/10) Additional Follow-up by: Orlan Leavens RMA,  July 15, 2010 9:15 AM    Additional Follow-up for Phone Call Additional follow up Details #2::    Pt left vm need referral to see surgeon asap. Was told by [facility] did'nt have order. Pls enter referral so they can set up appt. Follow-up by: Orlan Leavens RMA,  July 16, 2010 2:19 PM  Additional Follow-up for Phone Call Additional follow up Details #3:: Details for Additional Follow-up Action Taken: ordered re-entered now - thanks Newt Lukes MD  July 16, 2010 5:23 PM    Called pt no ansew [facility] referral was put in should be contacted by [facility] with appt.  Additional Follow-up by: Orlan Leavens RMA,  July 19, 2010 11:55 AM

## 2010-11-30 NOTE — Miscellaneous (Signed)
Summary: Nexium  Clinical Lists Changes  Medications: Changed medication from NEXIUM 40 MG  CPDR (ESOMEPRAZOLE MAGNESIUM) 1 capsule each day 30 minutes before meal to NEXIUM 40 MG  CPDR (ESOMEPRAZOLE MAGNESIUM) 1 capsule each day 30 minutes before meal-pharmacy-please d/c prescription for #30 tablets qmonth... - Signed Rx of NEXIUM 40 MG  CPDR (ESOMEPRAZOLE MAGNESIUM) 1 capsule each day 30 minutes before meal-pharmacy-please d/c prescription for #30 tablets qmonth...;  #90 x 1;  Signed;  Entered by: Lamona Curl CMA (AAMA);  Authorized by: Hart Carwin MD;  [facility]: [facility]  [address] [phone]*, [address] [place], [place], [place]  [id number], Ph: [phone] or [phone], Fax: [phone]    Prescriptions: NEXIUM 40 MG  CPDR (ESOMEPRAZOLE MAGNESIUM) 1 capsule each day 30 minutes before meal-pharmacy-please d/c prescription for #30 tablets qmonth...  #90 x 1   Entered by:   Lamona Curl CMA (AAMA)   Authorized by:   Hart Carwin MD   Signed by:   Lamona Curl CMA (AAMA) on 08/05/2010   Method used:   [facility]        [facility]  [address] [phone]* (retail)       [address]       [place], [place]  [id number]       Ph: [phone] or [phone]       Fax: [phone]   RxID:   [id number]

## 2010-11-30 NOTE — Procedures (Signed)
Summary: Upper Endoscopy  Patient: [name] Note: All result statuses are Final unless otherwise noted.  Tests: (1) Upper Endoscopy (EGD)   EGD Upper Endoscopy       DONE     [facility]     [address]     [place], [place]  [address]           ENDOSCOPY PROCEDURE REPORT           PATIENT:  [name], [name]  MR#:  [id number]     BIRTHDATE:  1961-03-23, 49 yrs. old  GENDER:  female           ENDOSCOPIST:  Hedwig Morton. Juanda Chance, MD     Referred by:  Charlaine Dalton. Sherene Sires, M.D.           PROCEDURE DATE:  06/08/2010     PROCEDURE:  EGD with biopsy     ASA CLASS:  Class II     INDICATIONS:  abdominal pain epig. pain, was taking NSAID's, wght     loss, diarrhea, vomiting,     abd.sono- fatty liver     chronic back pain           MEDICATIONS:   Versed 8 mg, Fentanyl 100 mcg, Benadryl 25 mg     TOPICAL ANESTHETIC:           DESCRIPTION OF PROCEDURE:   After the risks benefits and     alternatives of the procedure were thoroughly explained, informed     consent was obtained.  The LB [id number] endoscope was     introduced through the mouth and advanced to the second portion of     the duodenum, without limitations.  The instrument was slowly     withdrawn as the mucosa was fully examined.     <<PROCEDUREIMAGES>>           A hiatal hernia was found (see image1 and image5). small 1-2 cm     sliding hiatal hernia  Mild gastritis was found. mild patchy     antral gastritis With standard forceps, a biopsy was obtained and     sent to pathology (see image3 and image4).  The duodenal bulb was     normal in appearance, as was the postbulbar duodenum. With     standard forceps, a biopsy was obtained and sent to pathology (see     image2). r/o villous blunting    Retroflexed views revealed no     abnormalities.    The scope was then withdrawn from the patient     and the procedure completed.           COMPLICATIONS:  None           ENDOSCOPIC IMPRESSION:     1) Hiatal  hernia     2) Mild gastritis     3) Normal duodenum     s/p multiple biopsies     epig pain likely due to NSAID gastropathy     RECOMMENDATIONS:     1) Await pathology results     continue Nexiem 40 mg po qd           REPEAT EXAM:  In 0 year(s) for.           ______________________________     Hedwig Morton. Juanda Chance, MD           CC:           n.     eSIGNED:  Hedwig Morton. Jannelle Notaro at 06/08/2010 04:10 PM           Sunday, Klos, [id number]  Note: An exclamation mark (!) indicates a result that was not dispersed into the flowsheet. Document Creation Date: 06/08/2010 4:10 PM _______________________________________________________________________  (1) Order result status: Final Collection or observation date-time: 06/08/2010 15:33 Requested date-time:  Receipt date-time:  Reported date-time:  Referring Physician:   Ordering Physician: Lina Sar [phone]) Specimen Source:  Source: Launa Grill Order Number: [id number] Lab site:   Appended Document: Upper Endoscopy recall EGD 2 years  Appended Document: Upper Endoscopy    Clinical Lists Changes  Observations: Added new observation of EGD DUE: 05/31/12 (06/14/2010 13:41)      Appended Document: Upper Endoscopy     Procedures Next Due Date:    EGD: 05/2012

## 2010-11-30 NOTE — Progress Notes (Signed)
Summary: samples of nexium ----none avail  Phone Note Call from Patient Call back at Home Phone [phone]   Caller: Patient Call For: wert Summary of Call: pt wants samples of nexium. daughter can pick up today.  Initial call taken by: Tivis Ringer, CNA,  August 04, 2010 12:37 PM  Follow-up for Phone Call        called and spoke with pt.  informed her no samples of nexium avail at this time.  Aundra Millet Reynolds LPN  August 04, 2010 2:08 PM

## 2010-11-30 NOTE — Progress Notes (Signed)
Summary: start amaryl only if bs >200 fasting  Phone Note Call from Patient Call back at Home Phone [phone]   Caller: Patient Call For: [name] Reason for Call: Talk to Nurse Summary of Call: pt was told she needs to be back on her diabetes meds just not metformin or glucophage.  Please advise pt as to [facility] to take[facility] [address] Initial call taken by: Eugene Gavia,  May 24, 2010 2:57 PM  Follow-up for Phone Call        lmomtcb [address] [facility]  May 24, 2010 3:16 PM   patient returned call.  she states that MW took pt off metformin at last OV.  has endoscopy scheduled 8.9.11 and saw Amy w/ GI today who states that pt needs to be started on something for her DM.  pt is aware MW is out of the office today, will await call back tomorrow morning.  i have notified leslie of message in MW's inbox. Boone Master CNA/MA  May 24, 2010 3:28 PM  Follow-up by: Boone Master CNA/MA,  May 24, 2010 3:30 PM  Additional Follow-up for Phone Call Additional follow up Details #1::        only to take if fbs >200 amaryl 2 mg two times a day with meals, hold for fasting sugar <80  Returning call.Darletta Moll  May 24, 2010 3:53 PM  Additional Follow-up by: Nyoka Cowden MD,  May 24, 2010 3:46 PM    Additional Follow-up for Phone Call Additional follow up Details #2::    called and spoke with pt and she is aware of MW recs of the amaryl 2mg   two times a day ---this [facility] [address]   [address] Loop CMA  May 24, 2010 4:18 PM   New/Updated Medications: AMARYL 2 MG TABS (GLIMEPIRIDE) take one tablet by mouth two times a day

## 2010-11-30 NOTE — Assessment & Plan Note (Signed)
Summary: Primary svc/  change to metformin   Primary Provider/Referring Provider:  Sherene Sires  CC:  3 month followup.  Pt c/o right ear pain x 2 days.  She states "head feels congested".  She also c/o hands and feet swelling and sometimes face swells.  .  History of Present Illness: 50  yowf smoker with   morbid obesity complicated by hypertension, diabetes mellitus, gastroesophageal reflux, and severe degenerative disk disease status post surgery multiple times, and hyperlipidemia    3/09 back surgery Dr Dutch Quint pain in back and legs barely tolerable, full disability.    May 07, 2009 -ov . A1C 6.2 and LDL at goal 79, TC 153. Since last visit doing better w/ decreased pain, increased sensation in hands. Husband is working out of town, may be moving to [place] for his work in next year.e  September 15, 2009-- new med calendar - Pt o chest pain and "bubbles in stomach".  She states that chest pain is sharp and dull and worsens after eating and at bedtime.  She states that she feels like "bubbles in chest".  still having occasional low grade temps up to 101.2.  States that she has had intermittent n/v/d but non in the past week.  c/o increased fluid retention more in the evening.  lab , cxr  and [place] reviewed from last visit. which  were essentially unremarkable.  rx zegerid, resolved  December 22, 2009 3 month followup.  Pt c/o right ear pain x 2 days.  She states "head feels congested".  She also c/o hands and feet swelling and sometimes face swells.  Pt denies any significant sore throat, dysphagia, itching, sneezing,  nasal congestion or excess or purulent nasal  secretions,  fever, chills, sweats, unintended wt loss, pleuritic or exertional cp, hempoptysis, change in activity tolerance  orthopnea pnd or leg swelling.  Pt also denies any obvious fluctuation in symptoms with weather or environmental change or other alleviating or aggravating factors.        Current Medications (verified): 1)  Actos  30 Mg  Tabs (Pioglitazone Hcl) .... Take 1 Tablet By Mouth Once A Day 2)  Zegerid 40-1100 Mg  Caps (Omeprazole-Sodium Bicarbonate) .... One At Bedtime 3)  Prozac 40 Mg  Caps (Fluoxetine Hcl) .... Take 1 Tablet By Mouth Once A Day 4)  Norvasc 5 Mg  Tabs (Amlodipine Besylate) .... Take 1 Tablet By Mouth Once A Day 5)  Vytorin 10-40 Mg  Tabs (Ezetimibe-Simvastatin) .... Take 1 Tablet By Mouth Once A Day 6)  Hydrochlorothiazide 25 Mg  Tabs (Hydrochlorothiazide) .... Take 1 Tablet By Mouth Once A Day 7)  Aspirin 81 Mg Tbec (Aspirin) .... Take 1 Tablet By Mouth Once A Day 8)  Calcium Carbonate-Vitamin D 600-400 Mg-Unit  Tabs (Calcium Carbonate-Vitamin D) .... Take 1 Tablet By Mouth Two Times A Day 9)  Multivitamins   Tabs (Multiple Vitamin) .... Take 1 Tablet By Mouth Once A Day 10)  Citrucel  Powd (Methylcellulose (Laxative)) .Marland Kitchen.. 1 Tablespoon By Mouth Once Daily 11)  Vicodin 5-500 Mg Tabs (Hydrocodone-Acetaminophen) .Marland Kitchen.. 1-2 Every 4-6 Hours As Needed 12)  Flexeril 10 Mg Tabs (Cyclobenzaprine Hcl) .Marland Kitchen.. 1 Every 8 Hours As Needed 13)  Valium 2 Mg Tabs (Diazepam) .... Take 1 Tablet By Mouth Once A Day As Needed 14)  Levsin 0.125 Mg Tabs (Hyoscyamine Sulfate) .Marland Kitchen.. 1 Every 8 Hours As Needed 15)  Gas-X Extra Strength 125 Mg Caps (Simethicone) .... Take Before Meal 16)  Xanax 0.25 Mg  Tabs (Alprazolam) .... 1/2 -1 Tabs By Mouth Two Times A Day  Allergies (verified): 1)  ! Penicillin 2)  ! Sulfa 3)  ! Erythromycin 4)  ! Floxin 5)  ! 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) GERD (ICD-530.81) HYPERTENSION (ICD-401.9) DEGENERATIVE DISC DISEASE (ICD-722.6) DIABETES MELLITUS (ICD-250.00 HYPERLIPIDEMIA (ICD-272.4) SYNCOPE (ICD-780.2) OBESITY (ICD-278.00)    -peak all-time weight 242 December 22, 2009    - target to get BMI < 30 = 179   - referral to nutrition 05/19/2008 > refer again December 22, 2009  Depression Anxiety Right Upper quadrant pain > better  with ppi     - onset 08/31/2009     - ABD u/s September 08, 2009 > neg Health Maintenance      - [facility]  Vital Signs:  Patient profile:   50 year old female Weight:      242 pounds O2 Sat:      99 % on Room air Temp:     98.3 degrees F oral Pulse rate:   78 / minute BP sitting:   124 / 80  (left arm)  Vitals Entered By: Vernie Murders (December 22, 2009 9:52 AM)  O2 Flow:  Room air  Physical Exam  Additional Exam:  GEN: A/Ox3; pleasant , NAD wt 237 > 242 December 22, 2009  HEENT:  [place]/AT, , EACs-clear, TMs-wnl, NOSE-clear, THROAT-clear NECK:  Supple w/ fair ROM; no JVD; normal carotid impulses w/o bruits; no thyromegaly or nodules palpated; no lymphadenopathy. RESP  Clear to P & A; w/o, wheezes/ rales/ or rhonchi. CARD:  RRR, no m/r/g   GI:   Soft & sl. tender to RUQ, RLQ, and epigastric area,; nml bowel sounds; no organomegaly or masses detected. Musco: Warm bil,  no calf tenderness edema, clubbing, pulses intact Neuro: steady gait   Impression & Recommendations:  Problem # 1:  DIABETES MELLITUS (ICD-250.00)  Generalized swelling with wt gain but no edema strongly suggests actos as cause, try metformin  The following medications were removed from the medication list:    Actos 30 Mg Tabs (Pioglitazone hcl) .Marland Kitchen... Take 1 tablet by mouth once a day Her updated medication list for this problem includes:    Aspirin 81 Mg Tbec (Aspirin) .Marland Kitchen... Take 1 tablet by mouth once a day    Metformin Hcl 500 Mg Tabs (Metformin hcl) ..... One tablet twice daily  Orders: Est. Patient Level IV ([address])  Problem # 2:  GERD (ICD-530.81)  Her updated medication list for this problem includes:    Zegerid 40-1100 Mg Caps (Omeprazole-sodium bicarbonate) ..... One at bedtime    Levsin 0.125 Mg Tabs (Hyoscyamine sulfate) .Marland Kitchen... 1 every 8 hours as needed  May need to change to ppi .Take  one 30-60 min before first meal of the day and use pepcid 20 mg @ hs to eliminate salt in zegerid if swelling  continues  Problem # 3:  OBESITY (ICD-278.00)  Orders: Nutrition Referral (Nutrition)   Weight control is a matter of calorie balance which needs to be tilted in the pt's favor by eating less and exercising more.  Specifically, I recommended  exercise at a level where pt  is short of breath but not out of breath 30 minutes daily.  If not losing weight on this program, I would strongly recommend pt see a nutritionist with a food diary recorded for two weeks prior to the visit.     Problem # 4:  EAR PAIN (ICD-388.70)  No def source, ? related to eustachian tube dysfunction from smoking ? tmj.  consider ent eval if persists  Orders: Est. Patient Level IV ([address])  Problem # 5:  SMOKER (ICD-305.1) Discussed but not ready to committ to quit at this point - emphasized risks involved in continuing smoking and that patient should consider these in the context of the cost of smoking relative to the benefit obtained.   Medications Added to Medication List This Visit: 1)  Metformin Hcl 500 Mg Tabs (Metformin hcl) .... One tablet twice daily  Patient Instructions: 1)  See calendar for specific medication instructions and bring it back for each and every office visit for every healthcare provider you see.  Without it,  you may not receive the best quality medical care that we feel you deserve. 2)  Stop actos 3)  start metformin 500 one twice daily 4)  See Tammy NP w/in 4 weeks with the calendar for fasting  5)  See Patient Care Coordinator before leaving for nutrition eval Prescriptions: METFORMIN HCL 500 MG  TABS (METFORMIN HCL) One tablet twice daily  #60 x 11   Entered and Authorized by:   Nyoka Cowden MD   Signed by:   Nyoka Cowden MD on 12/22/2009   Method used:   Electronically to        [facility]  [address] [phone]* (retail)       [address]       [place], [place]  [id number]       Ph: [phone] or [phone]       Fax: [phone]   RxID:    [id number]

## 2010-11-30 NOTE — Letter (Signed)
Summary: Generic Letter  [facility]  [address] [place], [place] [address]   Phone: [phone]  Fax: [phone]    06/29/2010  [name] [address] [place], [place]  [address]  Dear [name] or whom it may concern,   [name] is under my medical care. She is a diabetic who is undergoing evaluation for unexplained abdominal pain with imaging tests and GI evaluation. The diagnosis is not clear at this time. Please consider this when reviewing the cancelled travel plans of her family members.      Sincerely,   Wendy Paci MD

## 2010-11-30 NOTE — Progress Notes (Signed)
Summary: HIDA scan  Phone Note Call from Patient Call back at Home Phone [phone] Call back at [phone]   Caller: Patient Reason for Call: Referral Summary of Call: Pt calling on status of HIDA scan. Pt was seen here Monday--[facility] pt Initial call taken by: Brenton Grills MA,  July 02, 2010 1:44 PM  Follow-up for Phone Call        I called pt to inform of appt for 07-14-2010@1 :00  at [facility] -PT DECLINE WANTED A SOONER  APPT  gv her the number to call when appt was schedule spoke with Tobi Bastos this was the first available Shelbie Proctor  July 06, 2010 9:10 AM

## 2010-11-30 NOTE — Procedures (Signed)
Summary: Colonoscopy  Patient: [name] Note: All result statuses are Final unless otherwise noted.  Tests: (1) Colonoscopy (COL)   COL Colonoscopy           [facility]ONE      [facility]     [address]     [place], [place]  [address]           COLONOSCOPY PROCEDURE REPORT           PATIENT:  [name], [name]  MR#:  [id number]     BIRTHDATE:  [birth year], 49 yrs. old  GENDER:  female     ENDOSCOPIST:  Hedwig Morton. Juanda Chance, MD     REF. BY:  Charlaine Dalton. Sherene Sires, M.D.     PROCEDURE DATE:  06/08/2010     PROCEDURE:  Colonoscopy 19[address]     ASA CLASS:  Class II     INDICATIONS:  Routine Risk Screening, unexplained diarrhea     MEDICATIONS:   Versed 5 mg, Fentanyl 25 mcg, Benadryl 25 mg           DESCRIPTION OF PROCEDURE:   After the risks benefits and     alternatives of the procedure were thoroughly explained, informed     consent was obtained.  Digital rectal exam was performed and     revealed no rectal masses.   The LB [id number] endoscope     was introduced through the anus and advanced to the cecum, which     was identified by both the appendix and ileocecal valve, without     limitations.  The quality of the prep was good, using MiraLax.     The instrument was then slowly withdrawn as the colon was fully     examined.     <<PROCEDUREIMAGES>>           FINDINGS:  Two polyps were found in the rectum and sigmoid colon.     2 and 3 mm polypd removed The polyps were removed using cold     biopsy forceps (see image6, image7, and image1).  This was     otherwise a normal examination of the colon (see image2, image3,     image4, and image5).   Retroflexed views in the rectum revealed no     abnormalities.    The scope was then withdrawn from the patient     and the procedure completed.           COMPLICATIONS:  None     ENDOSCOPIC IMPRESSION:     1) Two polyps in the rectum and sigmoid colon     2) Otherwise normal examination     RECOMMENDATIONS:     1) Await pathology  results     Bentyl 20 mg, #60, 1 po bid, 1 refill     REPEAT EXAM:  In 10 year(s) for.           ______________________________     Hedwig Morton. Juanda Chance, MD           CC:           n.     eSIGNED:   Hedwig Morton. Damarri Rampy at 06/08/2010 04:16 PM           Danae Orleans, [phone]  Note: [name] exclamation mark (!) indicates a result that was not dispersed into the flowsheet. Document Creation Date: 06/08/2010 4:16 PM _______________________________________________________________________  (1) Order result status: Final Collection or observation date-time: 06/08/2010 15:57 Requested date-time:  Receipt date-time:  Reported  date-time:  Referring Physician:   Ordering Physician: Lina Sar [phone]) Specimen Source:  Source: Launa Grill Order Number: [id number] Lab site:   Appended Document: Colonoscopy    Clinical Lists Changes  Observations: Added new observation of COLONNXTDUE: 05/31/2020 (06/14/2010 13:44)      Appended Document: Colonoscopy     Procedures Next Due Date:    Colonoscopy: 05/2020

## 2010-11-30 NOTE — Assessment & Plan Note (Signed)
Summary: STOMACH PROBLEMS/NWS   Vital Signs:  Patient profile:   50 year old female Height:      66.5 inches (168.91 cm) Weight:      224.4 pounds (102 kg) O2 Sat:      97 % on Room air Temp:     97.9 degrees F (36.61 degrees C) oral Pulse rate:   72 / minute BP sitting:   110 / 70  (left arm) Cuff size:   large  Vitals Entered By: Orlan Leavens RMA (June 29, 2010 2:46 PM)  O2 Flow:  Room air CC: stomach problems Is Patient Diabetic? Yes Did you bring your meter with you today? No   Primary Care Provider:  Newt Lukes MD  CC:  stomach problems.  History of Present Illness: here for increased abd pain - hx same - current flare started 4 days ago -  located epigatric region and RUQ - cramping, stabbing, sharp pain - a/w vomitting - bile no appetite - not triggered by foods or medications - has not been worse since starting metformin concerned about GB -  recent EGD reviewed (barretts)  reviewed chronic med issues: 1) DM2 - currently with hyper and hypo glycemic episodes and symptoms since starting glimepiride - prev cbgs better controlled on metformin, changed due to stomach issues and abd pain - resumed metfomin and pain and GI symptoms have not changed for better or worse - checks sugars 2x/d and as needed   2) HTN- reports compliance with ongoing medical treatment and no changes in medication dose or frequency. denies adverse side effects related to current therapy. no CP or edema, no HA or weakness  3) dyslipidemia - reports compliance with ongoing medical treatment and no changes in medication dose or frequency. denies adverse side effects related to current therapy. no GI or Mskel pain/prob  4) GERD with recent NSAID induced gastritis pain symptoms - ongoing GI eval for same; s/p EGD/colo 06/08/10 - on PPI and trying to reduce ibuprofen use - ?if alt NSAID type med that can help control pain symptoms to minimize narc use but not aggrevate stomach? planning GES  study in near future  5) chronic low back pain - DDD - s/p surg for same 3/09 - full disablity - recieves freq steroid injections for same (>3/yr) and uses vicodin as needed (pain meds mgmt by Nsurg-poole)  Current Medications (verified): 1)  Prozac 40 Mg  Caps (Fluoxetine Hcl) .... Take 1 Tablet By Mouth Once A Day 2)  Norvasc 5 Mg  Tabs (Amlodipine Besylate) .... Take 1 Tablet By Mouth Once A Day 3)  Vytorin 10-40 Mg  Tabs (Ezetimibe-Simvastatin) .... Take 1 Tablet By Mouth Once A Day 4)  Hydrochlorothiazide 25 Mg  Tabs (Hydrochlorothiazide) .... Take 1 Tablet By Mouth Once A Day 5)  Vicodin 5-500 Mg Tabs (Hydrocodone-Acetaminophen) .Marland Kitchen.. 1-2 Every 4-6 Hours As Needed 6)  Valium 2 Mg Tabs (Diazepam) .... Take 1 Tablet By Mouth Once A Day As Needed 7)  Onetouch Ultra Test  Strp (Glucose Blood) .... Use As Directed 8)  Promethazine Hcl 25 Mg Tabs (Promethazine Hcl) .... Take 1/4 By Mouth As Needed 9)  Amaryl 2 Mg Tabs (Glimepiride) .... Hold Unless Sugar >200 - If Over 200, Take One Tablet By Mouth Two Times A Day 10)  Nexium 40 Mg  Cpdr (Esomeprazole Magnesium) .Marland Kitchen.. 1 Capsule Each Day 30 Minutes Before Meal 11)  Bentyl 20 Mg  Tabs (Dicyclomine Hcl) .Marland Kitchen.. 1 By Mouth Two Times A  Day 12)  Metformin Hcl 500 Mg Xr24h-Tab (Metformin Hcl) .Marland Kitchen.. 1 By Mouth Once Daily X 7 Days, Then Increase To 1 By Mouth Two Times A Day (Or As Instructed) 13)  Meloxicam 15 Mg Tabs (Meloxicam) .Marland Kitchen.. 1 By Mouth Once Daily  Allergies (verified): 1)  ! Penicillin 2)  ! Sulfa 3)  ! Erythromycin 4)  ! Floxin 5)  ! * Latex  Past History:  Past Medical History: INTERSTITIAL CYSTITIS GERD  HYPERTENSION DEGENERATIVE DISC DISEASE  DIABETES MELLITUS HYPERLIPIDEMIA  OBESITY       -peak all-time weight 242 December 22, 2009    - target to get BMI < 30 = 179   - referral to nutrition 05/19/2008 > refer again December 22, 2009  Depression Anxiety Right Upper quadrant pain     - onset 08/31/2009     - ABD u/s September 08, 2009 > neg     - refer to GI  May 20, 2010 / try off ppi and metformin   -EGD/colo 06/08/10>gastritis and HH   MD roster: gyn -[facility] Nsurg - [facility] GI - Bro[facility]  Review of Systems  The patient denies chest pain, headaches, melena, hematochezia, severe indigestion/heartburn, and abnormal bleeding.    Physical Exam  General:  alert, well-developed, well-nourished, and cooperative to examination.    Lungs:  normal respiratory effort, no intercostal retractions or use of accessory muscles; normal breath sounds bilaterally - no crackles and no wheezes.    Heart:  normal rate, regular rhythm, no murmur, and no rub. BLE without edema. normal DP pulses and normal cap refill in all 4 extremities    Abdomen:  soft, mild tender over RUQ and epigastrium but no r/g, normal bowel sounds, no distention; no masses and no appreciable hepatomegaly or splenomegaly.     Impression & Recommendations:  Problem # 1:  RUQ PAIN (ICD-789.01)  long hx abd pain w/o definitive cause identified - see prior GI eval - ?GB despite normal [place] - arrange for HIDA and probable gen surg eval for consideration of chole - also consider GES to look for gastroparesis if HIDA unremarkable - Orders: Misc. Referral (Misc. Ref)  Discussed symptom control with the patient.   Problem # 2:  ABDOMINAL PAIN-EPIGASTRIC (ICD-789.06)  s/p EGD/colo 06/08/10 and GI eval for same - +evidence NSAID gastritis, mild - on trial PPI and bentyl for spasm - ?planning GES for n/v component - r/o gastroparesis - see above  Problem # 3:  NAUSEA AND VOMITING (ICD-787.01)  Orders: Misc. Referral (Misc. Ref)  Complete Medication List: 1)  Prozac 40 Mg Caps (Fluoxetine hcl) .... Take 1 tablet by mouth once a day 2)  Norvasc 5 Mg Tabs (Amlodipine besylate) .... Take 1 tablet by mouth once a day 3)  Vytorin 10-40 Mg Tabs (Ezetimibe-simvastatin) .... Take 1 tablet by mouth once a day 4)  Hydrochlorothiazide 25 Mg Tabs (Hydrochlorothiazide)  .... Take 1 tablet by mouth once a day 5)  Vicodin 5-500 Mg Tabs (Hydrocodone-acetaminophen) .Marland Kitchen.. 1-2 every 4-6 hours as needed 6)  Valium 2 Mg Tabs (Diazepam) .... Take 1 tablet by mouth once a day as needed 7)  Onetouch Ultra Test Strp (Glucose blood) .... Use as directed 8)  Promethazine Hcl 25 Mg Tabs (Promethazine hcl) .... Take 1/4 by mouth as needed 9)  Amaryl 2 Mg Tabs (Glimepiride) .... Hold unless sugar >200 - if over 200, take one tablet by mouth two times a day 10)  Nexium 40 Mg Cpdr (Esomeprazole magnesium) .Marland KitchenMarland KitchenMarland Kitchen  1 capsule each day 30 minutes before meal 11)  Bentyl 20 Mg Tabs (Dicyclomine hcl) .Marland Kitchen.. 1 by mouth two times a day 12)  Metformin Hcl 500 Mg Xr24h-tab (Metformin hcl) .Marland Kitchen.. 1 by mouth once daily x 7 days, then increase to 1 by mouth two times a day (or as instructed) 13)  Meloxicam 15 Mg Tabs (Meloxicam) .Marland Kitchen.. 1 by mouth once daily  Patient Instructions: 1)  it was good to see you today. 2)  no medication changes - 3)  we'll make referral for HIDA scan to look at gallbaldder function. Our office will contact you regarding this appointment once made.  4)  will plan for evaluation by general surgeon after these results have been reviewed 5)  letter about your medical illness composed to give to your mom

## 2010-11-30 NOTE — Letter (Signed)
Summary: [facility]   Imported By: Lennie Odor 09/29/2010 15:46:54  _____________________________________________________________________  External Attachment:    Type:   Image     Comment:   External Document

## 2010-11-30 NOTE — Assessment & Plan Note (Signed)
Summary: NP follow up - DM, GERD   Primary Provider/Referring Provider:  Sherene Sires  CC:  1 month follow up - states head congestion and swelling have improved.  Marland Kitchen  History of Present Illness: 50  yowf smoker with   morbid obesity complicated by hypertension, diabetes mellitus, gastroesophageal reflux, and severe degenerative disk disease status post surgery multiple times, and hyperlipidemia    3/09 back surgery Dr Dutch Quint pain in back and legs barely tolerable, full disability.  May 07, 2009 -ov . A1C 6.2 and LDL at goal 79, TC 153. Since last visit doing better w/ decreased pain, increased sensation in hands. Husband is working out of town, may be moving to [place] for his work in next year.e  September 15, 2009-- new med calendar - Pt o chest pain and "bubbles in stomach".  She states that chest pain is sharp and dull and worsens after eating and at bedtime.  She states that she feels like "bubbles in chest".  still having occasional low grade temps up to 101.2.  States that she has had intermittent n/v/d but non in the past week.  c/o increased fluid retention more in the evening.  lab , cxr  and [place] reviewed from last visit. which  were essentially unremarkable.  rx zegerid, resolved  December 22, 2009 3 month followup.  Pt c/o right ear pain x 2 days.  She states "head feels congested".  She also c/o hands and feet swelling and sometimes face swells.     January 25, 2010--Returns for 1 month follow up and labs. Last visit complained of fluid retention, actos stopped d/t fluid retention, started on metformin, sugars better. --avg fasting bs 120-164. She would like UA checked , had burning this am w/ urination. Continues to have some bloating, gas and fullness after eating. Denies chest pain, dyspnea, orthopnea, hemoptysis, fever, n/v/d, edema, headache,recent travel or antibiotics.   Medications Prior to Update: 1)  Zegerid 40-1100 Mg  Caps (Omeprazole-Sodium Bicarbonate) .... One At Bedtime 2)   Prozac 40 Mg  Caps (Fluoxetine Hcl) .... Take 1 Tablet By Mouth Once A Day 3)  Norvasc 5 Mg  Tabs (Amlodipine Besylate) .... Take 1 Tablet By Mouth Once A Day 4)  Vytorin 10-40 Mg  Tabs (Ezetimibe-Simvastatin) .... Take 1 Tablet By Mouth Once A Day 5)  Hydrochlorothiazide 25 Mg  Tabs (Hydrochlorothiazide) .... Take 1 Tablet By Mouth Once A Day 6)  Aspirin 81 Mg Tbec (Aspirin) .... Take 1 Tablet By Mouth Once A Day 7)  Calcium Carbonate-Vitamin D 600-400 Mg-Unit  Tabs (Calcium Carbonate-Vitamin D) .... Take 1 Tablet By Mouth Two Times A Day 8)  Multivitamins   Tabs (Multiple Vitamin) .... Take 1 Tablet By Mouth Once A Day 9)  Citrucel  Powd (Methylcellulose (Laxative)) .Marland Kitchen.. 1 Tablespoon By Mouth Once Daily 10)  Vicodin 5-500 Mg Tabs (Hydrocodone-Acetaminophen) .Marland Kitchen.. 1-2 Every 4-6 Hours As Needed 11)  Flexeril 10 Mg Tabs (Cyclobenzaprine Hcl) .Marland Kitchen.. 1 Every 8 Hours As Needed 12)  Valium 2 Mg Tabs (Diazepam) .... Take 1 Tablet By Mouth Once A Day As Needed 13)  Levsin 0.125 Mg Tabs (Hyoscyamine Sulfate) .Marland Kitchen.. 1 Every 8 Hours As Needed 14)  Gas-X Extra Strength 125 Mg Caps (Simethicone) .... Take Before Meal 15)  Xanax 0.25 Mg  Tabs (Alprazolam) .... 1/2 -1 Tabs By Mouth Two Times A Day 16)  Metformin Hcl 500 Mg  Tabs (Metformin Hcl) .... One Tablet Twice Daily 17)  Onetouch Ultra Test  Strp (Glucose  Blood) .... Use As Directed  Current Medications (verified): 1)  Zegerid 40-1100 Mg  Caps (Omeprazole-Sodium Bicarbonate) .... One At Bedtime 2)  Prozac 40 Mg  Caps (Fluoxetine Hcl) .... Take 1 Tablet By Mouth Once A Day 3)  Norvasc 5 Mg  Tabs (Amlodipine Besylate) .... Take 1 Tablet By Mouth Once A Day 4)  Vytorin 10-40 Mg  Tabs (Ezetimibe-Simvastatin) .... Take 1 Tablet By Mouth Once A Day 5)  Hydrochlorothiazide 25 Mg  Tabs (Hydrochlorothiazide) .... Take 1 Tablet By Mouth Once A Day 6)  Aspirin 81 Mg Tbec (Aspirin) .... Take 1 Tablet By Mouth Once A Day 7)  Calcium Carbonate-Vitamin D 600-400 Mg-Unit   Tabs (Calcium Carbonate-Vitamin D) .... Take 1 Tablet By Mouth Two Times A Day 8)  Multivitamins   Tabs (Multiple Vitamin) .... Take 1 Tablet By Mouth Once A Day 9)  Citrucel  Powd (Methylcellulose (Laxative)) .Marland Kitchen.. 1 Tablespoon By Mouth Once Daily As Needed 10)  Vicodin 5-500 Mg Tabs (Hydrocodone-Acetaminophen) .Marland Kitchen.. 1-2 Every 4-6 Hours As Needed 11)  Flexeril 10 Mg Tabs (Cyclobenzaprine Hcl) .Marland Kitchen.. 1 Every 8 Hours As Needed 12)  Valium 2 Mg Tabs (Diazepam) .... Take 1 Tablet By Mouth Once A Day As Needed 13)  Gas-X Extra Strength 125 Mg Caps (Simethicone) .... Take Before Meal 14)  Xanax 0.25 Mg  Tabs (Alprazolam) .... 1/2 -1 Tabs By Mouth Two Times A Day As Needed 15)  Metformin Hcl 500 Mg  Tabs (Metformin Hcl) .... One Tablet Twice Daily 16)  Onetouch Ultra Test  Strp (Glucose Blood) .... Use As Directed  Allergies (verified): 1)  ! Penicillin 2)  ! Sulfa 3)  ! Erythromycin 4)  ! Floxin 5)  ! 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) GERD (ICD-530.81) HYPERTENSION (ICD-401.9) DEGENERATIVE DISC DISEASE (ICD-722.6) DIABETES MELLITUS (ICD-250.00 HYPERLIPIDEMIA (ICD-272.4) SYNCOPE (ICD-780.2) OBESITY (ICD-278.00)    -peak all-time weight 242 December 22, 2009    - target to get BMI < 30 = 179   - referral to nutrition 05/19/2008 > refer again December 22, 2009  Depression Anxiety Right Upper quadrant pain > better with ppi     - onset 08/31/2009     - ABD u/s September 08, 2009 > neg Health Maintenance      - [facility]  Past Surgical History: Last updated: 05/07/2009 s/p neck and back surgery  Family History: Last updated: 09/15/2009 hypertension father ischemic heart disease, mother, bypass at age 49 allergies and asthma in sister gallbladder dz in mother  Social History: Last updated: 09/08/2009 active smoker-currently trying to quit but not successful yet. full disability since March 2009 due to back  pain Married,  2 kids.   Risk Factors: Smoking Status: current (05/19/2008) Packs/Day: 3 cigs a day (05/19/2008)  Review of Systems      See HPI  Vital Signs:  Patient profile:   50 year old female Height:      66 inches Weight:      238 pounds BMI:     38.55 O2 Sat:      96 % on Room air Temp:     98.6 degrees F oral Pulse rate:   73 / minute BP sitting:   128 / 68  (left arm) Cuff size:   regular  Vitals Entered By: Boone Master CNA (January 25, 2010 9:41 AM)  O2 Flow:  Room air CC: 1 month follow up - states head congestion, swelling have improved.  Is Patient Diabetic? Yes Comments Medications reviewed with patient Daytime contact number verified with patient. Boone Master CNA  January 25, 2010 9:42 AM    Physical Exam  Additional Exam:  GEN: A/Ox3; pleasant , NAD wt 237 > 242 December 22, 2009 >>238 January 25, 2010  HEENT:  [place]/AT, , EACs-clear, TMs-wnl, NOSE-clear, THROAT-clear NECK:  Supple w/ fair ROM; no JVD; normal carotid impulses w/o bruits; no thyromegaly or nodules palpated; no lymphadenopathy. RESP  Clear to P & A; w/o, wheezes/ rales/ or rhonchi. CARD:  RRR, no m/r/g   GI:   Soft & sl. tender to RUQ, RLQ, and epigastric area,; nml bowel sounds; no organomegaly or masses detected, no gurading or rebound.  Musco: Warm bil,  no calf tenderness edema, clubbing, pulses intact Neuro: steady gait   Impression & Recommendations:  Problem # 1:  DIABETES MELLITUS (ICD-250.00)  Will check A1C . cont w/ diet and exercise.   Her updated medication list for this problem includes:    Aspirin 81 Mg Tbec (Aspirin) .Marland Kitchen... Take 1 tablet by mouth once a day    Metformin Hcl 500 Mg Tabs (Metformin hcl) ..... One tablet twice daily  Orders: Est. Patient Level IV ([address])  Problem # 2:  HYPERTENSION (ICD-401.9)  cont on current regimen.   Her updated medication list for this problem includes:    Norvasc 5 Mg Tabs (Amlodipine besylate) .Marland Kitchen... Take 1 tablet by mouth  once a day    Hydrochlorothiazide 25 Mg Tabs (Hydrochlorothiazide) .Marland Kitchen... Take 1 tablet by mouth once a day  BP today: 128/68 Prior BP: 124/80 (12/22/2009)  Labs Reviewed: K+: 3.6 (09/08/2009) Creat: : 0.7 (09/08/2009)   Chol: 153 (04/29/2009)   HDL: 45.00 (04/29/2009)   LDL: 79 (04/29/2009)   TG: 146.0 (04/29/2009)  Orders: Est. Patient Level IV ([address])  Problem # 3:  GERD (ICD-530.81)  GERD w/ ?IBS as well. also could have component of gastroparesis (w/ hx of DM) REC: dietary changes.  Return for fasting labs.  Small frequent meals.  Gas X w/ meals as needed  I will call with lab results.  Please contact office for sooner follow up if symptoms do not improve or worsen  follow up Dr. Sherene Sires in 3 months.   The following medications were removed from the medication list:    Levsin 0.125 Mg Tabs (Hyoscyamine sulfate) .Marland Kitchen... 1 every 8 hours as needed Her updated medication list for this problem includes:    Zegerid 40-1100 Mg Caps (Omeprazole-sodium bicarbonate) ..... One at bedtime  Orders: Est. Patient Level IV (09811)  Problem # 4:  HYPERLIPIDEMIA (ICD-272.4) FLP pending.  diet reviewed.  Her updated medication list for this problem includes:    Vytorin 10-40 Mg Tabs (Ezetimibe-simvastatin) .Marland Kitchen... Take 1 tablet by mouth once a day  Orders: Est. Patient Level IV (91478)  Labs Reviewed: SGOT: 23 (09/08/2009)   SGPT: 24 (09/08/2009)   HDL:45.00 (04/29/2009), 44.0 (07/23/2007)  LDL:79 (04/29/2009), DEL (29/56/2130)  Chol:153 (04/29/2009), 211 (07/23/2007)  Trig:146.0 (04/29/2009), 153 (07/23/2007)  Medications Added to Medication List This Visit: 1)  Citrucel Powd (Methylcellulose (laxative)) .Marland Kitchen.. 1 tablespoon by mouth once daily as needed 2)  Xanax 0.25 Mg Tabs (Alprazolam) .... 1/2 -1 tabs by mouth two times a day as needed  Complete Medication List: 1)  Zegerid 40-1100 Mg Caps (Omeprazole-sodium bicarbonate) .... One at bedtime 2)  Prozac 40 Mg Caps (Fluoxetine hcl) ....  Take 1 tablet by mouth once a day 3)  Norvasc 5 Mg Tabs (Amlodipine besylate) .Marland KitchenMarland KitchenMarland Kitchen  Take 1 tablet by mouth once a day 4)  Vytorin 10-40 Mg Tabs (Ezetimibe-simvastatin) .... Take 1 tablet by mouth once a day 5)  Hydrochlorothiazide 25 Mg Tabs (Hydrochlorothiazide) .... Take 1 tablet by mouth once a day 6)  Aspirin 81 Mg Tbec (Aspirin) .... Take 1 tablet by mouth once a day 7)  Calcium Carbonate-vitamin D 600-400 Mg-unit Tabs (Calcium carbonate-vitamin d) .... Take 1 tablet by mouth two times a day 8)  Multivitamins Tabs (Multiple vitamin) .... Take 1 tablet by mouth once a day 9)  Citrucel Powd (Methylcellulose (laxative)) .Marland Kitchen.. 1 tablespoon by mouth once daily as needed 10)  Vicodin 5-500 Mg Tabs (Hydrocodone-acetaminophen) .Marland Kitchen.. 1-2 every 4-6 hours as needed 11)  Flexeril 10 Mg Tabs (Cyclobenzaprine hcl) .Marland Kitchen.. 1 every 8 hours as needed 12)  Valium 2 Mg Tabs (Diazepam) .... Take 1 tablet by mouth once a day as needed 13)  Gas-x Extra Strength 125 Mg Caps (Simethicone) .... Take before meal 14)  Xanax 0.25 Mg Tabs (Alprazolam) .... 1/2 -1 tabs by mouth two times a day as needed 15)  Metformin Hcl 500 Mg Tabs (Metformin hcl) .... One tablet twice daily 16)  Onetouch Ultra Test Strp (Glucose blood) .... Use as directed  Patient Instructions: 1)  Return for fasting labs.  2)  Small frequent meals.  3)  Gas X w/ meals as needed  4)  I will call with lab results.  5)  Please contact office for sooner follow up if symptoms do not improve or worsen  6)  follow up Dr. Sherene Sires in 3 months.

## 2010-11-30 NOTE — Progress Notes (Signed)
Summary: elevated blood sugar  Phone Note Call from Patient Call back at Home Phone [phone]   Caller: Patient Call For: Zalen Sequeira Reason for Call: Talk to Nurse Summary of Call: Had ESI Injections today - when she got home, checked sugar and it is 339 - was 120 when she got to her appt today.  Please advise. Initial call taken by: Eugene Gavia,  Mar 30, 2010 4:03 PM  Follow-up for Phone Call        pt has steroid injections in her back on a regular basis she staes sometimes it makes her glucose elevate and sometimes not. Today her glucose was 120 before she received the shot. After she went home glucose was 339. I advised steroids can do this to blood sugars and that it should start to decrease once steroids runt hrough her system. She states she went ahead and took her night time dose of metformin in betweent he time she called [place] and I returned her call. She states this usuall helps. She wanted to wait to see if this worked before a message was sent to MD. Pt states since it is close to closing she will just call before 5:30 or go to ER or Urgent care. I advised this would be fine.  She states if anything changes she will call us back.

## 2010-11-30 NOTE — Letter (Signed)
Summary: Diabetic Instructions  [facility]  [address] [place], [place] [address]   Phone: [phone]  Fax: [phone]    [name] 1961-08-06 MRN: [id number]   x    ORAL DIABETIC MEDICATION INSTRUCTIONS                                     The day before your procedure:   Take your diabetic pill as you do normally  The day of your procedure:   Do not take your diabetic pill    We will check your blood sugar levels during the admission process and again in Recovery before discharging you home  ________________________________________________________________________  _  _   INSULIN (LONG ACTING) MEDICATION INSTRUCTIONS (Lantus, NPH, 70/30, Humulin, Novolin-N)   The day before your procedure:   Take  your regular evening dose    The day of your procedure:   Do not take your morning dose    _  _   INSULIN (SHORT ACTING) MEDICATION INSTRUCTIONS (Regular, Humulog, Novolog)   The day before your procedure:   Do not take your evening dose   The day of your procedure:   Do not take your morning dose   _  _   INSULIN PUMP MEDICATION INSTRUCTIONS  We will contact the physician managing your diabetic care for written dosage instructions for the day before your procedure and the day of your procedure.  Once we have received the instructions, we will contact you.

## 2010-11-30 NOTE — Progress Notes (Signed)
Summary: [name] scan  Phone Note Call from Patient Call back at Home Phone [phone]   Caller: Patient Reason for Call: Talk to Nurse Summary of Call: Returning nurse call Initial call taken by: Orlan Leavens RMA,  July 15, 2010 8:51 AM  Follow-up for Phone Call        Called pt back gave results concerning scan. Pt states would like to see surgeon did let her know once appt has bben set up she will be contacted by [facility] with appt date & time Follow-up by: Orlan Leavens RMA,  July 15, 2010 9:14 AM

## 2010-11-30 NOTE — Letter (Signed)
Summary: Patient Notice- Polyp Results  [facility]  [address] [place], [place] [address]   Phone: [phone]  Fax: [phone]        June 15, 2010 MRN: [id number]    [name] [address] [place], [place]  [address]    Dear Ms. Gopaul,  I am pleased to inform you that the colon polyp(s) removed during your recent colonoscopy was (were) found to be benign (no cancer detected) upon pathologic examination.The polyp was hyperplastic ( not precancerou)  I recommend you have a repeat colonoscopy examination in 10 _ years to look for recurrent polyps, as having colon polyps increases your risk for having recurrent polyps or even colon cancer in the future.  Should you develop new or worsening symptoms of abdominal pain, bowel habit changes or bleeding from the rectum or bowels, please schedule an evaluation with either your primary care physician or with me.  Additional information/recommendations:  _x_ No further action with gastroenterology is needed at this time. Please      follow-up with your primary care physician for your other healthcare      needs.  __ Please call [phone] to schedule a return visit to review your      situation.  __ Please keep your follow-up visit as already scheduled.  __ Continue treatment plan as outlined the day of your exam.  Please call us if you are having persistent problems or have questions about your condition that have not been fully answered at this time.  Sincerely,  Hart Carwin MD  This letter has been electronically signed by your physician.  Appended Document: Patient Notice- Polyp Results letter mailed

## 2010-11-30 NOTE — Letter (Signed)
Summary: [facility] Instructions  [facility]  [address] [place], [place] [address]   Phone: [phone]  Fax: [phone]       JAQUELYNN WANAMAKER    August 19, 1961    MRN: [id number]       Procedure Day /Date:TUESDAY 06/08/2010     Arrival Time: 2PM     Procedure Time:3PM     Location of Procedure:                    [facility] (4th Floor)   PREPARATION FOR COLONOSCOPY WITH MIRALAX  Starting 5 days prior to your procedure 06/03/2010 do not eat nuts, seeds, popcorn, corn, beans, peas,  salads, or any raw vegetables.  Do not take any fiber supplements (e.g. Metamucil, Citrucel, and Benefiber). ____________________________________________________________________________________________________   THE DAY BEFORE YOUR PROCEDURE         DATE:06/07/2010 DAY: MONDAY  1   Drink clear liquids the entire day-NO SOLID FOOD  2   Do not drink anything colored red or purple.  Avoid juices with pulp.  No orange juice.  3   Drink at least 64 oz. (8 glasses) of fluid/clear liquids during the day to prevent dehydration and help the prep work efficiently.  CLEAR LIQUIDS INCLUDE: Water Jello Ice Popsicles Tea (sugar ok, no milk/cream) Powdered fruit flavored drinks Coffee (sugar ok, no milk/cream) Gatorade Juice: apple, white grape, white cranberry  Lemonade Clear bullion, consomm, broth Carbonated beverages (any kind) Strained chicken noodle soup Hard Candy  4   Mix the entire bottle of Miralax with 64 oz. of Gatorade/Powerade in the morning and put in the refrigerator to chill.  5   At 3:00 pm take 2 Dulcolax/Bisacodyl tablets.  6   At 4:30 pm take one Reglan/Metoclopramide tablet.  7  Starting at 5:00 pm drink one 8 oz glass of the Miralax mixture every 15-20 minutes until you have finished drinking the entire 64 oz.  You should finish drinking prep around 7:30 or 8:00 pm.  8   If you are nauseated, you may take the 2nd Reglan/Metoclopramide tablet at 6:30  pm.        9    At 8:00 pm take 2 more DULCOLAX/Bisacodyl tablets.     THE DAY OF YOUR PROCEDURE      DATE:  8/9/2011DAY:TUESDAY  You may drink clear liquids until 1PM  (2 HOURS BEFORE PROCEDURE).   MEDICATION INSTRUCTIONS  Unless otherwise instructed, you should take regular prescription medications with a small sip of water as early as possible the morning of your procedure.  Diabetic patients - see separate instructions. IF YOU RESTART YOUR DIABETIC MEDS           OTHER INSTRUCTIONS  You will need a responsible adult at least 49 years of age to accompany you and drive you home.   This person must remain in the waiting room during your procedure.  Wear loose fitting clothing that is easily removed.  Leave jewelry and other valuables at home.  However, you may wish to bring a book to read or an iPod/MP3 player to listen to music as you wait for your procedure to start.  Remove all body piercing jewelry and leave at home.  Total time from sign-in until discharge is approximately 2-3 hours.  You should go home directly after your procedure and rest.  You can resume normal activities the day after your procedure.  The day of your procedure you should not:  Drive   Make legal decisions   Operate machinery   Drink alcohol   Return to work  You will receive specific instructions about eating, activities and medications before you leave.   The above instructions have been reviewed and explained to me by   _______________________    I fully understand and can verbalize these instructions _____________________________ Date _______

## 2010-12-02 NOTE — Medication Information (Signed)
Summary: Fluoxetine/[facility]  Fluoxetine/[facility]   Imported By: Sherian Rein 11/19/2010 13:39:12  _____________________________________________________________________  External Attachment:    Type:   Image     Comment:   External Document

## 2010-12-02 NOTE — Medication Information (Signed)
Summary: Hydrochlorothiazide/[facility]  Hydrochlorothiazide/[facility]   Imported By: Sherian Rein 11/19/2010 13:40:26  _____________________________________________________________________  External Attachment:    Type:   Image     Comment:   External Document

## 2010-12-02 NOTE — Medication Information (Signed)
Summary: 4 meds/[facility]  4 meds/[facility]   Imported By: Lester [place] 11/16/2010 07:16:39  _____________________________________________________________________  External Attachment:    Type:   Image     Comment:   External Document

## 2010-12-02 NOTE — Medication Information (Signed)
Summary: Vytorin/[facility]  Vytorin/[facility]   Imported By: Sherian Rein 11/19/2010 13:43:13  _____________________________________________________________________  External Attachment:    Type:   Image     Comment:   External Document

## 2010-12-02 NOTE — Progress Notes (Signed)
Summary: metformin & strips  Phone Note Refill Request Message from:  Fax from Pharmacy on November 05, 2010 10:40 AM  Refills Requested: Medication #1:  ONETOUCH ULTRA TEST  STRP use as directed  Medication #2:  METFORMIN HCL 500 MG XR24H-TAB 1 by mouth once daily x 7 days  Method Requested: Fax to [facility] Initial call taken by: Orlan Leavens RMA,  November 05, 2010 10:40 AM  Follow-up for Phone Call        Faxed paper request back to mail order service @ [phone]. EMR updated Follow-up by: Orlan Leavens RMA,  November 05, 2010 11:33 AM    New/Updated Medications: METFORMIN HCL 500 MG XR24H-TAB (METFORMIN HCL) take 1 two times a day Prescriptions: METFORMIN HCL 500 MG XR24H-TAB (METFORMIN HCL) take 1 two times a day  #180 x 2   Entered by:   Orlan Leavens RMA   Authorized by:   Newt Lukes MD   Signed by:   Orlan Leavens RMA on 11/05/2010   Method used:   Historical   RxID:   [id number] ONETOUCH ULTRA TEST  STRP (GLUCOSE BLOOD) use as directed  #180 x 2   Entered by:   Orlan Leavens RMA   Authorized by:   Newt Lukes MD   Signed by:   Orlan Leavens RMA on 11/05/2010   Method used:   Historical   RxID:   [id number]

## 2010-12-02 NOTE — Medication Information (Signed)
Summary: Amlodipine/[facility]  Amlodipine/[facility]   Imported By: Sherian Rein 11/19/2010 13:41:45  _____________________________________________________________________  External Attachment:    Type:   Image     Comment:   External Document

## 2010-12-02 NOTE — Progress Notes (Signed)
Summary: Samples  Medications Added NEXIUM 40 MG  CPDR (ESOMEPRAZOLE MAGNESIUM) 1 capsule each day 30 minutes before meal-pharmacy-please d/c prescription for #30 tablets qmonth...       Phone Note Call from Patient Call back at Home Phone [phone]   Caller: Patient Call For: Dr. Juanda Chance Reason for Call: Talk to Nurse Summary of Call: Pt wants to know if we have any samples of nexium that she could come pick up Initial call taken by: [place] Johnson,  October 27, 2010 10:33 AM  Follow-up for Phone Call        I have left a message for the patient to call back. Dottie Nelson-Smith CMA Duncan Dull)  October 27, 2010 1:15 PM   Additional Follow-up for Phone Call Additional follow up Details #1::        Put 3 boxes of nexium at front desk for patient pick up. She states that her copay is 35 dollars but she is in a "money bind" right now. Additional Follow-up by: Lamona Curl CMA Duncan Dull),  October 28, 2010 9:38 AM    New/Updated Medications: NEXIUM 40 MG  CPDR (ESOMEPRAZOLE MAGNESIUM) 1 capsule each day 30 minutes before meal-pharmacy-please d/c prescription for #30 tablets qmonth.Marland KitchenMarland Kitchen

## 2010-12-08 NOTE — Medication Information (Signed)
Summary: Interaction response/[employer]  Interaction response/[employer]   Imported By: Lester [place] 12/01/2010 11:19:53  _____________________________________________________________________  External Attachment:    Type:   Image     Comment:   External Document

## 2010-12-08 NOTE — Medication Information (Signed)
Summary: Drug Interaction/[employer]  Drug Interaction/[employer]   Imported By: Sherian Rein 12/03/2010 12:18:25  _____________________________________________________________________  External Attachment:    Type:   Image     Comment:   External Document

## 2010-12-30 ENCOUNTER — Ambulatory Visit
Admission: RE | Admit: 2010-12-30 | Discharge: 2010-12-30 | Disposition: A | Discharge status: Home / Self Care | Payer: Commercial Indemnity | Source: Ambulatory Visit | Attending: Neurosurgery | Admitting: Neurosurgery

## 2010-12-30 ENCOUNTER — Other Ambulatory Visit: Payer: Self-pay | Admitting: Neurosurgery

## 2010-12-30 DIAGNOSIS — M545 Low back pain: Secondary | ICD-10-CM

## 2010-12-31 ENCOUNTER — Emergency Department (HOSPITAL_COMMUNITY)
Admission: EM | Admit: 2010-12-31 | Discharge: 2010-12-31 | Disposition: A | Discharge status: Home / Self Care | Payer: Commercial Indemnity | Attending: Emergency Medicine | Admitting: Emergency Medicine

## 2010-12-31 DIAGNOSIS — E7800 Pure hypercholesterolemia, unspecified: Secondary | ICD-10-CM | POA: Insufficient documentation

## 2010-12-31 DIAGNOSIS — R209 Unspecified disturbances of skin sensation: Secondary | ICD-10-CM | POA: Insufficient documentation

## 2010-12-31 DIAGNOSIS — M79609 Pain in unspecified limb: Secondary | ICD-10-CM | POA: Insufficient documentation

## 2010-12-31 DIAGNOSIS — E119 Type 2 diabetes mellitus without complications: Secondary | ICD-10-CM | POA: Insufficient documentation

## 2010-12-31 DIAGNOSIS — R631 Polydipsia: Secondary | ICD-10-CM | POA: Insufficient documentation

## 2010-12-31 DIAGNOSIS — Z9889 Other specified postprocedural states: Secondary | ICD-10-CM | POA: Insufficient documentation

## 2010-12-31 DIAGNOSIS — M545 Low back pain, unspecified: Secondary | ICD-10-CM | POA: Insufficient documentation

## 2010-12-31 DIAGNOSIS — R3589 Other polyuria: Secondary | ICD-10-CM | POA: Insufficient documentation

## 2010-12-31 DIAGNOSIS — R350 Frequency of micturition: Secondary | ICD-10-CM | POA: Insufficient documentation

## 2010-12-31 DIAGNOSIS — IMO0002 Reserved for concepts with insufficient information to code with codable children: Secondary | ICD-10-CM | POA: Insufficient documentation

## 2010-12-31 DIAGNOSIS — R29898 Other symptoms and signs involving the musculoskeletal system: Secondary | ICD-10-CM | POA: Insufficient documentation

## 2010-12-31 DIAGNOSIS — I10 Essential (primary) hypertension: Secondary | ICD-10-CM | POA: Insufficient documentation

## 2010-12-31 DIAGNOSIS — Z79899 Other long term (current) drug therapy: Secondary | ICD-10-CM | POA: Insufficient documentation

## 2010-12-31 DIAGNOSIS — R358 Other polyuria: Secondary | ICD-10-CM | POA: Insufficient documentation

## 2010-12-31 LAB — BASIC METABOLIC PANEL
BUN: 16 mg/dL (ref 6–23)
CO2: 24 mEq/L (ref 19–32)
Chloride: 101 mEq/L (ref 96–112)
GFR calc non Af Amer: >60 mL/min (ref >60)
Glucose, Bld: 277 mg/dL — ABNORMAL HIGH (ref 70–99)
Potassium: 3.9 mEq/L (ref 3.5–5.1)
Sodium: 136 mEq/L (ref 135–145)

## 2010-12-31 LAB — URINALYSIS, ROUTINE W REFLEX MICROSCOPIC
Bilirubin Urine: NEGATIVE
Nitrite: NEGATIVE
Specific Gravity, Urine: >1.03 — ABNORMAL HIGH (ref 1.005–1.030)
Urine Glucose, Fasting: >1000 mg/dL — AB
pH: 5 (ref 5.0–8.0)

## 2010-12-31 LAB — CBC
HCT: 40 % (ref 36.0–46.0)
Hemoglobin: 13.9 g/dL (ref 12.0–15.0)
MCV: 92.6 fL (ref 78.0–100.0)
RBC: 4.32 MIL/uL (ref 3.87–5.11)
RDW: 12.3 % (ref 11.5–15.5)
WBC: 10.5 10*3/uL (ref 4.0–10.5)

## 2010-12-31 LAB — URINE MICROSCOPIC-ADD ON

## 2010-12-31 LAB — DIFFERENTIAL
Basophils Absolute: 0 10*3/uL (ref 0.0–0.1)
Lymphocytes Relative: 7 % — ABNORMAL LOW (ref 12–46)
Lymphs Abs: 0.7 10*3/uL (ref 0.7–4.0)
Neutro Abs: 9.7 10*3/uL — ABNORMAL HIGH (ref 1.7–7.7)

## 2011-01-13 LAB — COMPREHENSIVE METABOLIC PANEL
Alkaline Phosphatase: 56 U/L (ref 39–117)
BUN: 14 mg/dL (ref 6–23)
Chloride: 103 mEq/L (ref 96–112)
Creatinine, Ser: 0.74 mg/dL (ref 0.4–1.2)
Glucose, Bld: 116 mg/dL — ABNORMAL HIGH (ref 70–99)
Potassium: 3.4 mEq/L — ABNORMAL LOW (ref 3.5–5.1)
Total Bilirubin: 0.5 mg/dL (ref 0.3–1.2)
Total Protein: 6.7 g/dL (ref 6.0–8.3)

## 2011-01-13 LAB — GLUCOSE, CAPILLARY
Glucose-Capillary: 119 mg/dL — ABNORMAL HIGH (ref 70–99)
Glucose-Capillary: 166 mg/dL — ABNORMAL HIGH (ref 70–99)

## 2011-01-13 LAB — SURGICAL PCR SCREEN: Staphylococcus aureus: NEGATIVE

## 2011-02-09 LAB — BASIC METABOLIC PANEL
BUN: 12 mg/dL (ref 6–23)
CO2: 31 mEq/L (ref 19–32)
Chloride: 100 mEq/L (ref 96–112)
Creatinine, Ser: 0.68 mg/dL (ref 0.4–1.2)

## 2011-02-09 LAB — CBC
MCHC: 35.2 g/dL (ref 30.0–36.0)
MCV: 94.4 fL (ref 78.0–100.0)
Platelets: 287 10*3/uL (ref 150–400)
RDW: 13.2 % (ref 11.5–15.5)

## 2011-02-09 LAB — GLUCOSE, CAPILLARY: Glucose-Capillary: 106 mg/dL — ABNORMAL HIGH (ref 70–99)

## 2011-02-11 ENCOUNTER — Other Ambulatory Visit: Payer: Self-pay | Admitting: *Deleted

## 2011-02-11 MED ORDER — ESOMEPRAZOLE MAGNESIUM 40 MG PO CPDR: 40.0000 mg | DELAYED_RELEASE_CAPSULE | Freq: Every day | ORAL | Status: DC

## 2011-02-11 NOTE — Telephone Encounter (Signed)
Request for Nexium has been sent from [facility]. We will send #[phone] with 1 refill. Patient will then need return office visit for further refills.

## 2011-03-03 ENCOUNTER — Other Ambulatory Visit: Payer: Self-pay | Admitting: Neurosurgery

## 2011-03-03 DIAGNOSIS — M545 Low back pain: Secondary | ICD-10-CM

## 2011-03-08 ENCOUNTER — Ambulatory Visit
Admission: RE | Admit: 2011-03-08 | Discharge: 2011-03-08 | Disposition: A | Discharge status: Home / Self Care | Payer: Commercial Indemnity | Source: Ambulatory Visit | Attending: Neurosurgery | Admitting: Neurosurgery

## 2011-03-08 DIAGNOSIS — M545 Low back pain: Secondary | ICD-10-CM

## 2011-03-15 NOTE — Op Note (Signed)
NAMEJHANIYA, [name]              ACCOUNT NO.:  [id number]   MEDICAL RECORD NO.:  [id number]          PATIENT TYPE:  AMB   LOCATION:  DSC                          FACILITY:  [facility]   PHYSICIAN:  Harvie Junior, M.D.   DATE OF BIRTH:  31-Jan-1961   DATE OF PROCEDURE:  05/23/2008  DATE OF DISCHARGE:                               OPERATIVE REPORT   PREOPERATIVE DIAGNOSES:  Questionable medial femoral condylar defect  with chondromalacia patella and tight lateral retinaculum.   POSTOPERATIVE DIAGNOSES:  1. Chondromalacia patella.  2. 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  3. Chondral defect of medial femoral condyle.   PRINCIPAL PROCEDURE:  1. Chondroplasty of the medial femoral condyle.  2. Chondroplasty of the patella femoral joint.  3. Lateral retinacular release.  4. Debridement of anterolateral scar tissue.   SURGEON:  Harvie Junior, MD.   ANESTHESIA:  General.   BRIEF HISTORY:  Ms. Chay is a 50 year old female with a long history  of having had a previous knee arthroscopy.  She has had a recent  twisting injury to her knee and began having increasing pain in the knee  with pain with all activity it was slowing down her rehab from multiple  back surgeries she had and because of that she discussed with her back  physician and they felt that surgical intervention was appropriate.  We  felt that MRI given that she has had surgery a year plus ago was not  going to be all that beneficial as this is most likely going to be a  chondral problem.  We certainly could tell she had tight lateral  retinaculum from examination with a positive inhibition grind.   She was brought to the operating room essentially for exploration,  evaluation of the patellofemoral joint, and probable lateral retinacular  release.   PROCEDURE:  The patient was brought to the operating room. After  adequate anesthesia obtained with general anesthetic, the patient was  placed supine  on the operating table. The left leg was then prepped and  draped in usual sterile fashion.  Following this, routine arthroscopic  examination of knee revealed there was an obvious chondral injury on the  end of the femur and medial thigh.  This was debrided back and was  smoothened with a stable rim.  Attention was turned to the medial  meniscus which was normal.  Weightbearing area of the medial femoral  condyle was appeared to be within normal limits.  ACL is normal, lateral  side extended to the condylar surface, and the meniscus were normal.  The attention turned to the anterolateral compartment where there was a  fair amount of scar tissue there.  Her patellar tracking was repaired on  the entrance into the knee, which was fairly significantly lateral.  The  lateral retinaculum was tight.  Interestingly, as we got down to the  anterolateral compartment, there was just dramatic tightness of lateral  tissues as well as anterolateral scar tissue.  At this point, we did a  lateral retinacular release with endoscopic Bovie.  There is a very nice  release  of the lateral retinaculum from 3 fingerbreadths proximal to the  patella down to the joint line and does allow the patella tracking to  become midline.  Then we took a shaver and really shaved out  anterolaterally (there was just a tremendous amount of scar tissue in  this area), freeing up both the lateral compartment and allowing the  patella to track more in the midline.  Once this was completed,  attention was turned up to patella femoral joint where there was some  mild chondromalacia which was debrided.  At this point, the knee was  copiously and thoroughly irrigated, suctioned, and dried.  Arthroscopy  portals were closed with a bandage.  Sterile compression dressing was  applied. The patient was taken to the recovery room and was noted to be  in satisfactory condition.   ESTIMATED BLOOD LOSS:  None.      Harvie Junior,  M.D.  Electronically Signed     JLG/MEDQ  D:  05/23/2008  T:  05/24/2008  Job:  [id number]

## 2011-03-15 NOTE — Op Note (Signed)
NAMESTEPHANYE, [name]              ACCOUNT NO.:  [id number]   MEDICAL RECORD NO.:  [id number]          PATIENT TYPE:  INP   LOCATION:  3535                         FACILITY:  [facility]   PHYSICIAN:  Kathaleen Maser. Pool, M.D.    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   PROCEDURE:  C5-C6 anterior cervical diskectomy with C5-C6 Prestige  interbody arthroplasty.   SURGEON:  Kathaleen Maser. Pool, MD   ASSISTANT:  Reinaldo Meeker, MD   ANESTHESIA:  General endotracheal.   INDICATIONS:  [name] is a 50 year old female with history of neck  and bilateral upper extremity pain.  The patient had an MRI scan which  demonstrates a central disk herniation with spinal cord compression of  C5-C6.  We discussed options of her management including both operative  and nonoperative care.  The patient has decided to proceed with C5-C6  anterior cervical diskectomy with interbody arthroplasty for hope for  improvement of her symptoms.   OPERATIVE NOTE:  The patient was taken to the operating room and placed  on the operating table in supine position.  After adequate level of  anesthesia was achieved, the patient was positioned supine with the neck  slightly extended and held in place with halter traction.  The patient's  anterior cervical region was prepped and draped sterilely.  A 10 blade  was used to make a curvilinear skin incision overlying the C5-C6  interspace.  This was carried down sharply to the platysma.  The  platysma was then divided vertically and dissection proceeded along the  medial border of the sternocleidomastoid muscle and carotid sheath.  Trachea and esophagus were mobilized and tracked towards the midline.  Longus  colli muscles were elevated bilaterally with electrocautery.  Deep self-retaining retractors were placed.  Intraoperative fluoroscopy  was used and C5-C6 level was confirmed.  Disk space was incised with 15  blade in a rectangular fashion.  Wide space clean-out was then achieved  using pituitary rongeurs, forward and backward Carlens curettes,  Kerrison rongeurs, and high-speed drill.  All elements of the disk were  removed down to the level of the posterior annulus.  Microscope was then  brought into field and used throughout the remainder of the diskectomy  and remaining aspects of the annulus and osteophytes were removed using  high-speed drill down to the level of the posterior longitudinal  ligament.  The posterior longitudinal ligament was then elevated and  resected in piecemeal fashion using Kerrison rongeurs.  The underlying  thecal sac was identified.  Wide central decompression was then  performed of interbody of C5 and C6.  All elements of the central disk  herniation were completely resected.  At this point, a very thorough  diskectomy was achieved.  There was no injury to thecal sac or nerve  roots.  The disk space was incised and 7  x 14 mm Prestige implant was  found to be the most appropriate fit.  Anterior osteophytes were  removed.  The arthroplasty implant was then impacted into place.  It was  then attached with 13-mm fixed angle screws.  All four screws had  excellent purchase.  The insertion device was removed.  Locking caps  were placed.  Final images revealed good position of the arthroplasty in  both the lateral and AP plane.  Wound was then irrigated with antibiotic solution.  Hemostasis was  ensured with bipolar electrocautery.  Wound was then closed in layers  with Vicryl sutures.  Steri-Strips and sterile dressings were applied.  There were no complications.  The patient tolerated the procedure well  and she returns to the recovery room postoperatively.            ______________________________  Kathaleen Maser Pool, M.D.     HAP/MEDQ  D:  02/06/2009  T:  02/07/2009  Job:  [id number]

## 2011-03-15 NOTE — Op Note (Signed)
[name], [name]              ACCOUNT NO.:  [id number]   MEDICAL RECORD NO.:  [id number]          PATIENT TYPE:  AMB   LOCATION:  SDS                          FACILITY:  [facility]   PHYSICIAN:  Reinaldo Meeker, M.D. DATE OF BIRTH:  [birth year]   DATE OF PROCEDURE:  09/11/2007  DATE OF DISCHARGE:                               OPERATIVE REPORT   PREOPERATIVE DIAGNOSIS:  Nonfunctioning spinal stimulator.   POSTOPERATIVE DIAGNOSIS:  Nonfunctioning spinal stimulator.   PROCEDURE:  Removal of spinal stimulator and stimulator battery.   SURGEON:  Dr. Gerlene Fee.   PROCEDURE IN DETAIL:  After placed in the prone position, the patient's  back and buttock were prepped and draped in the usual sterile fashion.  A lumbar incision was made and carried down to the fascia. Wires coming  off of the lead were dissected free of soft tissue and the boot where  the lead connects to the extension was identified.  The lead was cut as  it entered the boot.  The buttock incision was then opened and the  battery removed from the pouch without difficulty.  The wires going into  the battery were then incised and the wires then pulled up from the  thoracic incision so that they came off that area with the boot  attached.  The bladder was then removed and that incision irrigated out.  The residual lead wires were then carried down to the lead which was  found to go sublaminar.  This was dissected free of soft tissue and  removed without difficulty.  Both wounds were then irrigated copiously  with antibiotic irrigation and closed with interrupted Vicryl in the  muscle, fascia, subcutaneous and subcuticular tissues and Dermabond on  the skin.  Sterile dressings were then applied.  The patient was  extubated and taken to the recovery room in stable condition.           ______________________________  Reinaldo Meeker, M.D.     ROK/MEDQ  D:  09/11/2007  T:  09/12/2007  Job:  [id number]

## 2011-03-15 NOTE — Op Note (Signed)
[name], [name]              ACCOUNT NO.:  [id number]   MEDICAL RECORD NO.:  [id number]          PATIENT TYPE:  INP   LOCATION:  3033                         FACILITY:  [facility]   PHYSICIAN:  Kathaleen Maser. Pool, M.D.    DATE OF BIRTH:  1961-03-03   DATE OF PROCEDURE:  12/24/2007  DATE OF DISCHARGE:                               OPERATIVE REPORT   PREOPERATIVE DIAGNOSES:  1. 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  2. Status post L5-S1 decompression and fusion with instrumentation.   POSTOPERATIVE DIAGNOSES:  1. 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  2. Status post L5-S1 decompression and fusion with instrumentation.   PROCEDURE NOTE:  Re-exploration of L5-S1 fusion with removal of L5-S1  instrumentation.  L3-4 and L4-5 decompressive laminectomy with  foraminotomies involving the L3, L4, and L5 nerve roots bilaterally,  redo, more than would be required for simple interbody fusion alone.  L3-  4 and L4-5 posterior lumbar body fusion utilizing tangent interbody  allograft wedge, interbody PEEK cage and local autografting.  L3-L5  posterolateral arthrodesis utilizing segmental pedicle screw fixation  and local autografting.   SURGEON:  Kathaleen Maser. Pool, M.D.   ASSISTANT:  Donalee Citrin, M.D.   ANESTHESIA:  General endotracheal.   INDICATIONS:  [name] is a 50 year old female who is status post  previous L5-S1 decompression and fusion with instrumentation and  reasonably good results.  The patient is also status post 2 previous  right-sided L3-4 extraforaminal decompressions with microdiskectomy.  The patient presents now with chronic back and right lower extremity  pain failing all conservative management.  Diskography demonstrates  concordant response at both the L3-4 and L4-5 levels.  The L2-3 level  was negative.  The L5-S1 level is stable on x-rays  and appears well  fused.  Plan now is to proceed with L3-4 and L4-5 decompression and  fusion with instrumentation.   OPERATIVE NOTE:  The patient was taken to the operating room and placed  in supine position.  After adequate level was achieved, the patient was  positioned prone onto Wilson frame, appropriately padded.  The patient's  lumbar regions were prepped and draped sterilely.  A 10 blade was used  to make a linear incision extending from L2 down to her sacrum.  This  was carried sharply in the midline. Subperiosteal dissection was  performed exposing the lamina facet joints of L3, L4, L5 and the upper  aspect of the sacrum.  The transverse processes at L3, L4, and L5 were  dissected free.  Pedicle instrumentation at L5 and S1 was dissected  free.  Deep self-retaining retractor was placed.  Intraoperative  fluoroscopy was used.  Levels were confirmed.  The pedicle screw  instrumentation was disassembled at L5-S1.  The fusion was inspected and  found to be quite solid.  Screws were removed at S1.  Screws at L5 were  allowed to remain.  Decompressive laminectomies at L3-L4 were then  performed using Leksell  rongeurs, Kerrison rongeurs, and high-speed  drill to completely remove the lamina of L3 and completely remove the  lamina of L4 and remove epidural scar at the L5 level.  Inferior  facetectomies of L3 and L4 were performed bilaterally.  Superior  facetectomies of L4 and L5 were performed bilaterally.  Ligament flavum  and epidural scar were then elevated and resected in piecemeal fashion  using Kerrison rongeurs.  Underlying thecal sac and exiting L3, L4 and  L5 nerve roots were identified.  Wide decompressive foraminotomy was  then performed across the exiting  nerve roots bilaterally.  Epidural  venous plexus coagulated and cut.  Bilateral microdiskectomies were then  performed at L3-4 and L4-5.  Disk space then sequentially dilated  starting first at L4-5.  A 10-mm  distraction __________ patient's left  side.  Thecal sac and nerve roots were protected on the right side.  Disk space was then reamed and cut with 10-mm tangent instruments.  Soft  tissue was removed from interspace.  A 10 x 22-mm Telamon cage was then  impacted into place and recessed approximately 2 mm from the posterior  cortical margin.  Distractors were moved to the patient's left side.  Thecal sac and nerve roots were protected on the left side.  Disk space  was then reamed and cut with 10-mm tangent instruments.  Soft tissues  were removed from the interspace.  Disk space was then packed with  morselized autograft mixed with Progenix putty.  A 10 x 26-mm tangent  wedge was then impacted into place and recessed roughly 2 mm from the  posterior cortical  margin of L4.  Procedure was then repeated at L3-4  in a similar fashion, again using 10-mm implant.  Pedicles at L3 and L4  were then identified using surface landmarks and intraoperative  fluoroscopy.  Superficial bone around the pedicles was then removed  using the high-speed drill.  Each pedicle was then probed using pedicle  awl.  Each pedicle awl tract was then tapped with 5.25-mm screw tapper.  Each screw hole was probed and found to be solid within bone.  The 6.75  x 45-mm radius screws were placed bilaterally at L3, 6.75 x 40-mm screws  placed bilaterally at L4.  Transverse processes of 3, 4, and 5 were then  decorticated using a high speed drill.  Morselized autograft was packed  posterolaterally for later fusion.  Short segment titanium rod was then  contoured and placed through the screw heads at L3, L4 and L5.  Locking  caps were then placed over screw heads.  Locking caps then engaged with  construct under decompression.  Final images revealed good position of  bone grafts and hardware at proper operative level and normal alignment  of the spine.  Wound was then irrigated one final time.  Gelfoam was  placed topically  for hemostasis.  Medium Hemovac drain was left in the  epidural space.  A transverse connector was placed.  Wound was then  closed in layers using Vicryl sutures.  Steri-Strips and sterile  dressing were applied.  There were no complications. The patient  tolerated the procedure well, and she returned to the recovery room for  postoperative care.           ______________________________  Kathaleen Maser. Pool, M.D.     HAP/MEDQ  D:  12/24/2007  T:  12/25/2007  Job:  [id number]

## 2011-03-15 NOTE — Assessment & Plan Note (Signed)
[facility]                             PULMONARY OFFICE NOTE   Wendy, Owens                     MRN:          [id number]  DATE:07/23/2007                            DOB:          1961/05/04    HISTORY OF PRESENT ILLNESS:  The patient is a 50 year old white female  patient of Dr. Sherene Sires who has a known history of hypertension, diabetes  mellitus, gastroesophageal reflux, and severe degenerative disk disease  status post surgery x2, and hyperlipidemia who presents today for an  acute office visit.  The patient also has underlying interstitial  cystitis and has been seen recently by Dr. Logan Bores and started on 3 new  prescriptions including Flomax, Elmiron, and hydroxyzine.  The patient  reports shortly after starting these medications, the patient has been  having fatigue, general malaise.  The patient reports that she had an  episode that occurred on July 21, 2007, when she went out to dinner  with friends.  She had 3 what she describes as mixed alcoholic  drinks/shots of liquor and shortly after had an episode in which she  passed out.  The patient reports that she does not drink on a regular  basis and has not drunk this amount of alcohol in a long time.  The  patient reports that she drank this and within 3 hours was at a  restaurant and does not remember arriving at the restaurant but friends  told her that she sat down and then subsequently passed out.  EMS did  come to the scene and the patient was woken up and talked with them and  vital signs showed a lower blood pressure but is unsure of the exact  number and blood sugar was at 134.  The patient's friends took her home.  However, the patient reports that she does not remember any of the time  from arriving at the restaurant until the next morning at 6 a.m.  The  patient states she did feel very tired and fatigued yesterday, however,  symptoms have decreased today.  She has not taken  any medications since  this event.  The patient does not remember having any chest pain,  palpitations, headache, visual changes.  There was no reported seizure,  urinary or bowel incontinence, and there was no change in speech or  visual changes reported.  The patient has had no previous episodes of  this.  The patient has no family history of sudden cardiac death before  age 55.  The patient reports that blood sugars have been normal  yesterday and today.   PAST MEDICAL HISTORY:  1. Hyperlipidemia.  2. Diabetes mellitus.  3. Morbid obesity.  4. Severe degenerative disk disease status post back surgery.  5. Atypical chest pain and syncopal episode in 2004 with negative      stress Cardiolite, echo, and Holter monitor.  6. Hyperlipidemia with target LDL goal of less than 70 to 100.  7. Hypertension.  8. Gastroesophageal reflux.  9. Interstitial cystitis.   CURRENT MEDICATIONS:  1. Prozac 40 mg daily.  2. Norvasc 5 mg daily.  3. Aspirin 325 mg daily.  4. HCTZ 25 mg daily.  5. Vytorin 10/40 daily.  6. Actos 30 mg daily.  7. Hydroxyzine 25, one to two q.h.s.  8. Flomax 0.4 mg daily.  9. Elmiron 100 mg b.i.d.  10.Skelaxin p.r.n.  11.Valium 5 mg p.r.n.  12.Percocet p.r.n.  13.Relafen p.r.n.  14.Xanax p.r.n.   DRUG ALLERGIES:  1. PENICILLIN.  2. SULFA.  3. LATEX.  4. E-MYCIN.  5. FLOXIN.   PHYSICAL EXAMINATION:  GENERAL:  The patient is a pleasant female in no  acute distress.  VITAL SIGNS:  She is afebrile with stable vital signs.  O2 saturation  98% on room air. Weight is 236.  HEENT:  Unremarkable.  PERRLA.  EOMI without nystagmus.  Posterior  pharynx is clear.  TMs normal.  NECK:  Supple without cervical adenopathy.  No JVD.  Carotids are equal  with positive upstrokes bilaterally without any bruits.  No thyromegaly  or nodules appreciated.  RESPIRATORY:  Lung sounds are clear to auscultation bilaterally.  CARDIAC:  S1 S2 without murmur, rub, or gallop.  ABDOMEN:   Soft, nontender.  No palpable hepatosplenomegaly.  No bruits  or masses appreciated.  EXTREMITIES:  Warm without any calf tenderness, cyanosis, clubbing, or  edema.  Moves all extremities well.  Equal strength of the upper  extremities.  The patient does have diminished strength of the lower  extremities, however, this is chronic in nature due to her severe  degenerative disk disease and previous back surgeries.  NEUROLOGIC:  Alert and oriented x3.  Cranial nerves II-XII are intact.  No focal deficits detected.   DATA:  EKG reveals a normal sinus rhythm with poor R wave progression  without significant change from previous EKGs in the past.   IMPRESSION AND PLAN:  1. Questionable syncopal episode on July 21, 2007.  I suspect      this episode was indicative of an adverse effect of medications and      alcohol combination.  The patient is on several medicines including      muscle relaxers, pain medicines, anxiolytics that should not be      mixed with alcohol.  The patient had several alcoholic liquor      drinks that was unusual for the patient and that combination w/      associated hypotension episode with recent addition of 3 new      medications including Atarax, Elmiron, and Flomax could have caused      the above symptoms.  The patient did not have any cardiac symptoms      or obvious neurologic symptoms and symptoms seemed to improve after      the patient had slept for several hours.  The patient's EKG is      unremarkable today in the office.  And she has had a previous      cardiac workup in the past which had been unrevealing.  The patient      will undergo labs today including CBC, C-MET, TSH, A1c, and lipid      panel today.  We follow up accordingly.  The patient has been      advised extensively on no alcoholic intake with her medications.      And, I have asked her to please contact Dr. Logan Bores' office and hold      her new prescriptions since she was already having  adverse effects      prior to this weekend's episode and to discuss  her adverse      medication effects with these  new medications. The patient will      return back here in 1 weeks for followup.  The patient is advised      if symptoms reoccur, the patient may need referral to cardiology      and/or neurology.  The patient has been advised if symptoms do      reoccur she is to contact our office for sooner followup or seek      emergency room attention.  2. Hyperlipidemia with an LDL goal of less than 70-100.  The patient's      fasting lipid panel is pending today.  We will discuss it at next      week's office visit.  3. Diabetes mellitus.  A1c goal of less than 6.5.  The patient's A1c      is pending and we will also discuss options as well.      Rubye Oaks, NP  Electronically Signed      Charlaine Dalton. Sherene Sires, MD, [facility]  Electronically Signed   TP/MedQ  DD: 07/23/2007  DT: 07/24/2007  Job #: [id number]

## 2011-03-18 NOTE — Assessment & Plan Note (Signed)
[facility]                             PULMONARY OFFICE NOTE   [name], [name]                     MRN:          [id number]  DATE:01/31/2007                            DOB:          04/19/1961    HISTORY:  A 50 year old white female morbid obesity with active smoking  here to discuss hyperlipidemia and diabetes management. She has not yet  seen the nutritionist with a food diary as requested and states she is  not able to exercise because of back problems. She is actively seeing  Dr. Jordan Likes for this.   She denies any exertional chest pain, orthopnea, PND, or leg swelling.   Medication reviewed in detail on the worksheet and inventoried in the  column dated January 31, 2007 correct as listed.   On physical examination she is an obese white female in no acute  distress, weighing 229 which is up another 2 pounds from previous visit.  She is afebrile, stale vital signs.  HEENT: Unremarkable. Oropharynx clear.  NECK: Supple without cervical adenopathy, or tenderness. Trachea is  midline, or thyromegaly.  LUNG FIELDS: Completely clear bilaterally to auscultation  and  percussion.  There is a regular rate and rhythm without murmur, gallop, or rub.  ABDOMEN: Soft, benign.  EXTREMITIES: Warm without calf tenderness, cyanosis, clubbing, or edema.   Total cholesterol is 147 with an LDL of 77, HDL of 52, LFTs were not  repeated because they had just been done in February and were normal.   IMPRESSION:  Extended discussion with this patient lasted 15 to 20  minutes in a 25 minute visit on the following topics;  1. Morbid obesity is her primary reversible medical problem and needs      to be addressed in the context of appropriate calorie balance. I      have strongly recommended she keep the appointment to see [facility]      nutrition with a food diary and help her husband as well in this      regard because he is also a patient of mine with the same  problem.  2. Hyperlipidemia. Her repeat lipids indicate that she is at target on      Vytorin 40/10 one daily so I did not change the regimen.  3. Diabetes mellitus. Her most recent hemoglobin A1C is 6.3 on Actos      15 mg two q.a.m. and the only change I made was to change her to 30      mg tablet q.a.m.  4. Follow up will be in 3 months for a comprehensive health care      evaluation with continued  work on weight in the meantime.     [name]. Wendy Sires, MD, [facility]  Electronically Signed    MBW/MedQ  DD: 02/01/2007  DT: 02/01/2007  Job #: [id number]

## 2011-03-18 NOTE — Op Note (Signed)
[name], [name]              ACCOUNT NO.:  [id number]   MEDICAL RECORD NO.:  [id number]          PATIENT TYPE:  AMB   LOCATION:  SDS                          FACILITY:  [facility]   PHYSICIAN:  Henry A. Pool, M.D.    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   PROCEDURE NAME:  Right L3-4 redo extraforaminal microdiskectomy.   SURGEON:  Kathaleen Maser. Pool, M.D.   ASSISTANT:  Donalee Citrin, M.D.   ANESTHESIA:  General orotracheal.   INDICATIONS:  [name] is a 50 year old lady with right lower extremity  pain consistent with a right sided L3 radiculopathy.  Workup demonstrates  evidence of recurrent right L3-4 extraforaminal disk herniation compression  exiting right L3 nerve root.  The patient counseled as to her options.  She  decided proceed with a right-sided L3-4 redo extraforaminal microdiskectomy.   OPERATIVE NOTE:  The patient was taken to the operating room placed table  supine position.  After adequate level anesthesia achieved, patient  positioned supine and appropriately padded.  Patient's lumbar region was  prepped and draped sterilely.  10 blade used to make a linear skin incision  overlying the L3-4 level.  This was carried down sharply in the midline.  Subperiosteal dissection was then performed exposing the lamina and facet  joints of L3 and L4 on the right side as well as the transverse process of  L3.  Deep self-retaining retractor was placed. X-ray was taken. The level  was confirmed.  The extraforaminal space was dissected free using dental  instruments.  Surrounding scar tissue was resected.  The bony edges were  widened slightly.  The pars  interarticularis was resected medially slightly.  The proximal aspect of the L3 nerve root was identified.  Decompression then  proceeded along the L3 nerve root including a significant neurolysis.  The  disk space was then identified. This was then incised with a 15 blade in a  rectangular fashion.  A wide disk space cleanout was then achieved using  pituitary rongeurs, upward angled pituitary rongeurs and Epstein curettes.  All elements of the recurring disk herniation were completely resected.  All  loose or obviously degenerated disk material was removed.  A generous  foraminotomy was performed along the course of the exiting L3 nerve root.  Wound was then irrigated with antibiotic solution.  Gelfoam was placed  topically for hemostasis which was found to be good.  Microscope and  retractor system were removed.  Hemostasis in the muscle was achieved with electrocautery.  Wounds were  closed in layers with Vicryl sutures.  Steri-Strips and sterile dressings  were applied.  There were no apparent complications.  The patient tolerated  the procedure well and she returned to recovery room postoperatively.           ______________________________  Kathaleen Maser Pool, M.D.  HAP/MEDQ  D:  02/27/2006  T:  02/28/2006  Job:  [id number]

## 2011-03-18 NOTE — Op Note (Signed)
[name], [name]              ACCOUNT NO.:  [id number]   MEDICAL RECORD NO.:  [id number]          PATIENT TYPE:  AMB   LOCATION:  DFTL                         FACILITY:  [facility]   PHYSICIAN:  Reinaldo Meeker, M.D. DATE OF BIRTH:  29-Dec-1960   DATE OF PROCEDURE:  09/07/2006  DATE OF DISCHARGE:                                 OPERATIVE REPORT   PREOPERATIVE DIAGNOSIS:  Failed back syndrome.   POSTOPERATIVE DIAGNOSIS:  Failed back syndrome.   PROCEDURE:  Permanent spinal stimulator implant as well as spinal stimulator  battery implant.   SURGEON:  Reinaldo Meeker, M.D.   PROCEDURE IN DETAIL:  After placed in the prone position, the patient's back  was prepped and draped as well as the left buttock.  Localizing fluoroscopy  was used to identify the appropriate level.  Midline incision was made above  the spinous process of T10.  Subperiosteal dissection was then carried out  along  the left-sided spinous process and lamina and self retractor was  placed for exposure.  A left hemilaminotomy was performed and trial passing  of the lead was carried out which was found to not be difficult and was able  to pass into good position centered over the vertebral bodies of T8-T9.  At  this time a pocket for the battery was made in the left buttock.  This was  trialed to make sure there was a good size and the battery was then taken  back out of it.  Passing was then carried out without difficulty from the  buttock incision to the back incision and the extension cable was secured to  the lead and then passed from the back down to the buttock.  The lead was  then placed into a good position after a few trials and it was centered over  the T8 and T9 vertebral bodies slightly right of midline and covering  the  midline well.  Large amounts of irrigation were carried out over both  incisions.  The leads were then attached the battery and impedance tested,  was good.  The wounds were then both  closed with interrupted Vicryl in the  muscle fascia, subcutaneous and subcuticular tissues.  Staples were placed  on the skin.  The buttock incision was closed with 2-0 and 3-0 Vicryl on the  fat and subcuticular layer and staples on the skin.  Sterile dressings were  then applied and the patient was extubated taken to recovery room in stable  condition.           ______________________________  Reinaldo Meeker, M.D.     ROK/MEDQ  D:  09/07/2006  T:  09/08/2006  Job:  [id number]

## 2011-03-18 NOTE — Op Note (Signed)
[facility]  Patient:    [name], [name] Visit Number: [id number] MRN: [id number]          Service Type: SUR Location: 4W [id number] [id number] Attending Physician:  Patricia Nettle Dictated by:   Patricia Nettle, M.D. Proc. Date: 09/20/01 Admit Date:  09/20/2001                             Operative Report  PREOPERATIVE DIAGNOSIS:  L5-S1 disk herniation and lateral recess stenosis, right.  POSTOPERATIVE DIAGNOSIS: L5-S1 disk herniation and lateral recess stenosis, right.  OPERATION:  Right L5-S1 microsurgical diskectomy with lateral recess decompression.  SURGEON:  Patricia Nettle, M.D.  ASSISTANT:  _________, P.A.  ANESTHESIA:  General endotracheal.  ESTIMATED BLOOD LOSS:  Minimal.  COMPLICATIONS:  None.  INDICATIONS:  The patient is a 50 year old female who developed severe back and right lower extremity pain on September 05, 2001.  At that time she was seen by Dr. Francena Hanly and admitted to [facility] with intractable pain. She was started no IV pain medication.  She had pain and weakness in an S1 distribution on the right side.  Strength was 4+/5 in the gastroc-soleus. Deep tendon reflexes were 2+ at the knees and the ankles.  Positive straight leg raise on the right side.  An MRI scan was obtained while in the hospital which showed a right paracentral disk herniation at L5-S1 with facet hypertrophy and impingement of the right S1 nerve root.  She was started on a Medrol Dosepak.  She was given a selective nerve root injection right L5-S1 by radiology.  She was discharged home on Percocet.  Unfortunately, her symptoms persisted.  She now presents for microsurgical decompression and diskectomy for intractable right lower extremity pain.  DESCRIPTION OF PROCEDURE: The patient was properly identified in the holding area as [name] and taken to the operating room.  She was given prophylactic IV antibiotics.  She underwent general  endotracheal anesthesia without difficulty.  She was turned prone onto the abdomen in a four poster frame.  Care was taken to pad all bony prominences.  The back was prepped and draped in the usual sterile fashion.  A spinal needle was placed over the L5-S1 interspace was obtained.  A 3 cm incision was then made centered on the needle.  This was carried down to the fascia.  The fascia was incised and the paraspinal muscles elevated off of the L5-S1 lamina on the right side exposing the interspace.  A McCullough retractor was placed.  A marker was placed in the interspace and a second x-ray was obtained confirming the L5-S1 level.  A bur was used to remove 3 mm of the medial facet and the inferior portion of the L5 lamina.  Kerrison punches were used to undercut the superior facet, decompressing the lateral recess.  The S1 nerve root was identified and mobilized medially.  A bulging disk was identified.  Annulotomy was performed and a diskectomy carried out.  It should be noted that this was all done under the microscope.  The disk space was irrigated.  The S1 root was then probed and found to be completely free from its take-off at the L5-S1 foramen.  The wound was copiously irrigated.  The deep fascia was closed using a #1 running Vicryl suture.  The subcutaneous tissue was approximated using 2-0 interrupted Vicryl sutures.  A 3-0 Monocryl was used on the skin.  Steri-Strips were placed.  A sterile dressing was applied and the patient was turned supine. She was extubated without difficulty, wiggling her toes.  She was transferred to the holding area in stable condition. Dictated by:   Patricia Nettle, M.D. Attending Physician:  Patricia Nettle. DD:  09/20/01 TD:  09/21/01 Job: [id number]

## 2011-03-18 NOTE — Letter (Signed)
December 08, 2006    Rosana Berger  [address]  [place], [place]  [address]   RE:  SHAVETTE, SHOAFF  MRN:  [id number]  /  DOB:  Aug 12, 1961   Dear Ms. Degraffenreid:   My records indicate that you had scheduled a comprehensive health care  evaluation for December 08, 2006 and also had missed appointments that  you had scheduled on August 16 and on June 20.  This means that we have  repeatedly reserved spaces for you on our schedule that were not  available for other patients.  Please forgive me for writing this letter  if the error is on our part and feel free to schedule another visit for  a comprehensive care evaluation as soon as you can, but be sure to keep  the appointment or call to cancel it.   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   Best personal regards.    Sincerely,      Casimiro Needle B. Sherene Sires, MD, [facility]  Electronically Signed    MBW/MedQ  DD: 12/08/2006  DT: 12/08/2006  Job #: [id number]

## 2011-03-18 NOTE — Op Note (Signed)
NAME:  [name], [name]                        ACCOUNT NO.:  [id number]   MEDICAL RECORD NO.:  [id number]                   PATIENT TYPE:  OIB   LOCATION:  2899                                 FACILITY:  [facility]   PHYSICIAN:  Kathaleen Maser. Pool, M.D.                 DATE OF BIRTH:  [birth year]   DATE OF PROCEDURE:  01/13/2004  DATE OF DISCHARGE:                                 OPERATIVE REPORT   PREOPERATIVE DIAGNOSIS:  Right L3-4 extraforaminal herniated nucleus  pulposus with radiculopathy.   POSTOPERATIVE DIAGNOSIS:  Right L3-4 extraforaminal herniated nucleus  pulposus with radiculopathy.   PROCEDURE:  Right L3-4 extraforaminal microdiskectomy.   SURGEON:  Kathaleen Maser. Pool, M.D.   ASSISTANT:  Reinaldo Meeker, M.D.   ANESTHESIA:  General endotracheal.   INDICATIONS:  [name] is a 50 year old female with history of right  lower extremity pain consistent with a right-sided L3 radiculopathy.  She  failed conservative management.  Workup has demonstrated evidence of a right-  sided L3-4 extraforaminal disk herniation with compression of the right-  sided L3 nerve root.  The patient has been counseled as to her options.  She  has decided to proceed with a right-sided L3-4 extraforaminal  microdiskectomy in hopes of improving her symptoms.   OPERATIVE NOTE:  The patient was taken to the operating room and placed on  the table in a supine position.  After an adequate level of anesthesia  achieved, the patient positioned prone onto a Wilson frame, appropriately  padded.  The patient's lumbar region was prepped and draped sterilely.  A 10  blade was used to make a linear skin incision overlying the L3-4 interspace.  This was carried down sharply in the midline.  A subperiosteal dissection  was then performed on the right side, exposing the laminae and facet joints  of L3 and L4 as well as the right-sided L3 transverse process.  A deep self-  retaining retractor was placed,  intraoperative x-ray was taken, and the  level was confirmed.  An extraforaminal approach was then performed by  removing the lateral aspect of the superior facet of L4 and the lateral  aspect of the inferior facet of L3 as well as the small lateral aspect of  the pars interarticularis.  The intertransverse ligament was then elevated  and resected in piecemeal fashion.  Underlying right-sided L3 nerve root was  identified.  Perineural fat was coagulated and dissected free.  The nerve  root was retracted slightly cephalad.  A moderately large contained disk  herniation was then identified.  This was then incised in a cruciate  fashion.  All herniated disk material was then removed using pituitary  rongeurs and blunt nerve hooks.  The disk space was entered and decompressed  from this side.  At this point the disk herniation was completely resected.  There was no evidence of any residual compression.  The course of  the nerve  root was probed both proximally and distally and found to be free of any  further compression.  There was no evidence of injury to thecal sac or nerve  roots.  The wound was then irrigated with antibiotic solution.  Gelfoam was  placed topically for hemostasis.  The microscope  and retractor system were removed.  Hemostasis in the muscle was achieved in  the muscle.  The wound was then closed in layers with Vicryl sutures.  Steri-  Strips and sterile dressings were applied.  There were no apparent  complications.  She tolerated the procedure well, and she returns to the  recovery room postop.                                               Henry A. Pool, M.D.    HAP/MEDQ  D:  01/13/2004  T:  01/14/2004  Job:  [id number]

## 2011-03-18 NOTE — H&P (Signed)
[place]  Patient:    [name], COGAN Visit Number: [id number] MRN: [id number]          Service Type: Attending:  Vania Rea. Supple, M.D. Dictated by:   Sammuel Cooper. Mahar, P.A. Adm. Date:  09/05/01                           History and Physical  DATE OF BIRTH:  23-Aug-1961  PATIENT IDENTIFICATION:  Patient is a 50 year old female.  CHIEF COMPLAINT:  Low back and right leg pain.  HISTORY OF PRESENT ILLNESS:  Patient has no history of back pain until today. Says she bent over to blow her hair dry and had a sudden onset of sharp pain in her back that extended down her right leg.  Since that time she has been having difficulty moving around secondary to pain.  ALLERGIES:  ERYTHROMYCIN causes a rash; PENICILLIN causes anaphylactic reaction; SULFA causes a rash; FLOXACINS cause a rash; and the patient is LATEX ALLERGIC.  MEDICATIONS: 1. Norvasc 5 mg one tablet q.d. 2. Prozac 40 mg one tablet q.d. 3. Estratest low dose.  PAST MEDICAL HISTORY:  Significant for hypertension and interstitial cystitis.  PAST SURGICAL HISTORY:  Cesarean section x 2, hysterectomy, right carpal tunnel release, left carpal tunnel release, and right de Quervains surgery.  SOCIAL HISTORY:  Patient smokes one-half pack of cigarettes per day.  She has rare alcohol use.  She is married with two children and she cares for her elderly grandmother.  FAMILY MEDICAL HISTORY:  Mother is alive at age 67 and healthy.  Father deceased at age 61 with a history of hypertension and died as a result of an accident.  REVIEW OF SYSTEMS:  Patient denies any fevers, chills, night sweats, or bleeding tendencies.  CNS:  Denies blurred vision, double vision, seizure, headache, paralysis.  PULMONARY:  Denies shortness of breath, productive cough, or hemoptysis.  CARDIOVASCULAR:  Denies chest pain, angina, claudication, or palpitations.  GI:  Denies nausea, vomiting,  constipation, diarrhea, melena, or bloody stool.  GU:  Denies dysuria, hematuria, or discharge.  PHYSICAL EXAMINATION:  VITAL SIGNS:  Blood pressure 144/88, respirations 16 and unlabored, pulse 64 and regular.  GENERAL APPEARANCE:  Patient is a 50 year old white female.  She is in obvious discomfort secondary to her back pain.  She is alert and oriented.  She is in no acute distress.  She is well nourished and well groomed.  HEENT:  Head is atraumatic, normocephalic.  Pupils are equal, round and reactive to light and has a patent throat.  Pharynx is clear.  NECK:  Soft and supple to palpation.  No bruits noted or lymphadenopathy noted.  CHEST:  Clear to auscultation bilaterally.  No rales, rhonchi, wheezes, stridor, or friction rub.  BREASTS:  Not pertinent, not performed.  HEART:  Regular rate and rhythm, S1, S2.  No murmurs, gallops, or rubs noted.  ABDOMEN:  Soft and supple to palpation.  Bowel sounds throughout.  No organomegaly noted.  Nontender, nondistended.  GENITOURINARY:  Not pertinent, not performed.  EXTREMITIES:  It was difficult to examine to low back and lower extremities secondary to the patients pain and guarding.  SKIN:  Intact without any lesions or rashes.  IMPRESSION:  Low back pain extending down the right leg and foot, possible herniated disk.  PLAN:  Admit to [facility] on September 05, 2001 to Dr. Francena Hanly for an MRI and pain  control and monitoring.Dictated by:   Sammuel Cooper. Mahar, P.A. Attending:  Vania Rea. Supple, M.D. DD:  09/05/01 TD:  09/06/01 Job: [id number]

## 2011-03-18 NOTE — Op Note (Signed)
NAME:  [name], [name]                        ACCOUNT NO.:  [id number]   MEDICAL RECORD NO.:  [id number]                   PATIENT TYPE:  INP   LOCATION:  3014                                 FACILITY:  [facility]   PHYSICIAN:  Kathaleen Maser. Pool, M.D.                 DATE OF BIRTH:  August 09, 1961   DATE OF PROCEDURE:  07/02/2004  DATE OF DISCHARGE:                                 OPERATIVE REPORT   PREOPERATIVE DIAGNOSES:  1.  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  2.  L5-S1 degenerative disk disease with mechanical instability.   POSTOPERATIVE DIAGNOSES:  1.  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  2.  L5-S1 degenerative disk disease with mechanical instability.   PROCEDURE NAME:  1.  Re-exploration of L5-S1 laminotomy, bilateral, with bilateral redo      microdiskectomies.  2.  L5-S1 posterior lumbar interbody fusion utilizing Tangent wedges and      local autografting.  3.  L5-S1 posterolateral fusion utilizing pedicle screw instrumentation and      local autografting.   SURGEON:  Kathaleen Maser. Pool, M.D.   ASSISTANT:  Donalee Citrin, M.D.   ANESTHESIA:  General endotracheal.   INDICATION FOR PROCEDURE:  [name] is a 50 year old female who is  remotely in the past undergone a right-sided L5-S1 laminotomy and  microdiskectomy by another physician.  She presents now with severe back and  right lower extremity pain failing all conservative management.  Workup has  demonstrated evidence of a small right-sided L5-S1 recurrent disk  herniation.  Discography produces a concordant pain response at the L5-S1  level.  We have discussed the options available for management, including  the possibility of undergoing an L5-S1 decompression and fusion in hopes of  improving her symptoms.  The patient is aware of the risks and benefits and  wishes to proceed.   OPERATIVE NOTE:  The patient was taken to the operating room and placed on  the  table in a supine position.  After an adequate level of anesthesia was  achieved, the patient was positioned prone onto a Wilson frame and  appropriately padded.  The patient's lumbar region was prepped and draped  sterilely.  A 10 blade was used to make a linear skin incision overlying the  L5-S1 interspace.  This was carried down sharply in the midline.  A  subperiosteal dissection then performed, exposing the laminae and facet  joints at L5 and S1.  A deep self-retaining retractor was placed,  intraoperative fluoroscopy was used, and the level was confirmed.  The  patient's previous laminotomy on the right side at L5-S1 was dissected free.  Complete laminectomy at L5 was then performed using Leksell rongeurs,  Kerrison rongeurs, and the high-speed drill to remove the entire lamina of  L5, the entire inferior facet joint of L5 bilaterally, and the superior  facet joint of S1 bilaterally.  All bone  was cleaned and used in later  autografting.  A small amount of the superior aspect of the lamina at S1 was  also resected.  The ligamentum and epidural scar were then resected using  Kerrison rongeurs.  The underlying thecal sac and exiting L5 and S1 nerve  roots were identified.  Wide foraminotomies were performed along the course  of the exiting L5 and S1 nerve roots.  All elements of the epidural scar  were completely resected.  Starting first at the patient's right side, the  thecal sac and nerve roots were gradually mobilized and retracted toward the  midline.  Recurrent disk herniation was encountered and incised with a 15  blade in rectangular fashion.  A wide disk space clean-out was then achieved  using pituitary rongeurs, upward-angled pituitary rongeurs, and Epstein  curettes.  All loose or obviously degenerative disk material was removed  from the interspace.  The procedure was then repeated on the contralateral  side.  The disk space was then subsequently dilated up to 10 mm.  A 10  mm  distractor was left in the patient's right side.  The thecal sac and nerve  root were protected on the left side.  The disk space was then reamed and  then cut with 10 mm Tangent instruments.  The soft tissue was removed from  the interspace.  The 10 x 26 mm Tangent wedge was then impacted into place  and recessed approximately 2 mm from the posterior cortical margin.  The  distractor was removed from the patient's right side, the thecal sac and  nerve roots protected on the right side once again.  The disk space was once  again reamed and then cut with 10 mm instruments.  Soft tissue removed from  the interspace.  Disk space was further curettaged.  Morcellized autograft  was then packed into the interspace.  A second 10 x 26 mm Tangent wedge was  then impacted into place and recessed approximately 2 mm from the posterior  cortical margin.  The pedicles at L5 and S1 were then identified using  superficial landmarks and intraoperative fluoroscopy.  The superficial bone  overlying the pedicles was removed using the high-speed drill.  Each pedicle  was then probed using a pedicle awl.  Each pedicle awl was then tapped with  a 5.25 mm screw tap.  Each screw tap hole was probed and found to be solidly  within bone.  Spiral 90D 6.75 x 45 mm screws placed bilaterally at L5, 6.75  x 35 mm screws placed bilaterally at S1.  The transverse processes and  sacral alae were then decorticated with the high-speed drill.  Morcellized  autograft was packed posterolaterally for later fusion.  A short segment of  titanium rod was then contoured and placed through the screw heads at L5 and  S1.  A locking cap was then placed over the screw heads.  The locking caps  were then engaged with the construct under compression.  Final images  revealed good position of bone grafts and hardware, proper operative level, and normal alignment of the spine.  The wound was then irrigated one final  time.  Gelfoam was  placed for hemostasis and found to be good.  A medium  Hemovac drain was left in the epidural space.  The wound was then closed in  layers with Vicryl sutures.  Steri-Strips and sterile dressings were  applied.  There were no complications.  The patient tolerated the procedure  well, and  she returns to the recovery room postop.                                               Henry A. Pool, M.D.    HAP/MEDQ  D:  07/02/2004  T:  07/03/2004  Job:  [id number]

## 2011-03-18 NOTE — Assessment & Plan Note (Signed)
[facility]                             PULMONARY OFFICE NOTE   Wendy, Owens                     MRN:          [id number]  DATE:12/13/2006                            DOB:          Aug 07, 1961    HISTORY OF PRESENT ILLNESS:  The patient is a 50 year old white female  patient of Dr. Sherene Sires who has a known history of diabetes mellitus,  hyperlipidemia, and hypertension, and severe degenerative disk disease  status post back surgery x2 in 2007.  The patient has not been seen in  the office in over 6 months, reporting that she has had 2 separate back  surgeries and has been having difficult getting out, and that has  required extensive recovery.  The patient has recent had a spinal nerve  stimulator placed in November 2007 due to chronic pain.  The patient  presents for routine followup.  The patient is maintained on Actos 30 mg  daily.  Last hemoglobin A1c was 5.5.  The patient did have some  increased blood sugars after her back surgery and was increased up from  15 to 30 mg of Actos.  The patient does complain that her weight has  increased up to 20 pounds over the last 6 months due to significant  sedentary lifestyle due to her back pain and inability to exercise.  The  patient complains of having increased anxiety, heartburn symptoms with  regurgitation into the throat, and some intermittent chest wall  tightness.  Intermittent chest tightness has been on and off for the  last several weeks and seems to be mainly at rest and at bedtime, and is  not related to activity.  The patient complains she has been under a lot  of stress.  Her mother-in-law has passed away recently, daughter is very  sick in college, with mono, and as above her recent chronic back pain  with previous surgeries.  The patient denies any abdominal pain, nausea  and vomiting, bloody stools, presyncopal or syncopal episodes or  palpitations.   Past medical history is  reviewed, current medications reviewed.   PHYSICAL EXAMINATION:  The patient is an obese female in no acute  distress.  She is afebrile, with stable vital signs.  Oxygen saturation  is 98% on room air.  HEENT:  Unremarkable.  NECK:  Supple, without adenopathy.  No JVD.  LUNG SOUNDS:  Clear.  CARDIAC:  S1, S2, without murmurs, rubs or gallops.  ABDOMEN:  Soft and nontender.  EXTREMITIES:  Warm, without any edema.   DATA:  EKG revealed a normal sinus rhythm with no significant changes.   IMPRESSION AND PLAN:  1. Hypertension, currently controlled on her present regimen.  2. Diabetes mellitus.  The patient will continue on Actos 30 mg.  We      will check A1c and follow up accordingly.  The patient is advised      on nutritional information and advance activity as tolerated and      per her neurosurgeon.  3. Gastroesophageal reflux.  The patient is to begin Protonix daily,      along  with anti-reflux preventative measures.  4. Atypical chest wall pain.  Suspect this is related to reflux and/or      musculoskeletal.  The patient does have family history of coronary      artery disease.  However this is atypical for cardiac and EKG is      without significant change.  The patient may also use some Aleve      p.r.n. and will return here in 2 weeks with Dr. Sherene Sires is symptoms do      not resolve.  May need referral to cardiologist and further workup.  5. Hyperlipidemia.  Presently on Vytorin.  LDL goal less than 70.      Fasting lipid panel is pending and we will follow up accordingly.      Rubye Oaks, NP  Electronically Signed      Charlaine Dalton. Sherene Sires, MD, [facility]  Electronically Signed   TP/MedQ  DD: 12/15/2006  DT: 12/15/2006  Job #: [id number]

## 2011-03-18 NOTE — Discharge Summary (Signed)
[name], [name]              ACCOUNT NO.:  [id number]   MEDICAL RECORD NO.:  [id number]          PATIENT TYPE:  INP   LOCATION:  3014                         FACILITY:  [facility]   PHYSICIAN:  Kathaleen Maser. Pool, M.D.    DATE OF BIRTH:  1961-06-28   DATE OF ADMISSION:  07/02/2004  DATE OF DISCHARGE:  07/05/2004                                 DISCHARGE SUMMARY   FINAL DIAGNOSIS:  L5, S1 recurrent herniated nucleus pulposus with  radiculopathy.   OPERATIONS AND TREATMENTS:  L5, S1 decompression and fusion  with  instrumentation.   HISTORY OF PRESENT ILLNESS:  Ms. Roen is a 50 year old female status  post previous right-sided L5,S1 laminotomy and microdiskectomy by another  physician.  She presents now with chronic back and radicular pain failing  all conservative management.  Workup demonstrates evidence of segmental  instability with severe degenerative disk disease at L5,S1 level.  She  presents now for decompression and fusion with instrumentation.   HOSPITAL COURSE:  The patient was taken to the operative room and underwent  uncomplicated L5, S1 decompression and fusion with instrumentation was  performed.  Postoperatively, the patient did very well.  Her radicular pain  was completely resolved.  Back pain was much improved.  She was gradually  mobilized without difficulty.  At the time of discharge her wound is healing  well.  She is tolerating a regular diet. Her preoperative radicular pain  remains resolved.   CONDITION ON DISCHARGE:  Improved.   Discharge followup is in one week in my office.       HAP/MEDQ  D:  10/29/2004  T:  10/29/2004  Job:  [id number]

## 2011-04-11 ENCOUNTER — Telehealth: Payer: Self-pay | Admitting: Internal Medicine

## 2011-04-11 NOTE — Telephone Encounter (Signed)
Spoke with pt. She is pretty sure has mono- fever, aches, prod cough with yellow/brown sputum. I advised MW and TP out today and she should be evaluated today at [facility].  Pt verbalized understanding and is okay with this plan.

## 2011-04-21 ENCOUNTER — Encounter: Payer: Self-pay | Admitting: Adult Health

## 2011-04-22 ENCOUNTER — Ambulatory Visit: Payer: Commercial Indemnity | Admitting: Adult Health

## 2011-04-27 ENCOUNTER — Encounter: Payer: Self-pay | Admitting: Adult Health

## 2011-04-28 ENCOUNTER — Ambulatory Visit (INDEPENDENT_AMBULATORY_CARE_PROVIDER_SITE_OTHER)
Admission: RE | Admit: 2011-04-28 | Discharge: 2011-04-28 | Disposition: A | Discharge status: Home / Self Care | Payer: Commercial Indemnity | Source: Ambulatory Visit | Attending: Adult Health | Admitting: Adult Health

## 2011-04-28 ENCOUNTER — Ambulatory Visit (INDEPENDENT_AMBULATORY_CARE_PROVIDER_SITE_OTHER): Payer: Commercial Indemnity | Admitting: Adult Health

## 2011-04-28 ENCOUNTER — Encounter: Payer: Self-pay | Admitting: Adult Health

## 2011-04-28 ENCOUNTER — Other Ambulatory Visit (INDEPENDENT_AMBULATORY_CARE_PROVIDER_SITE_OTHER): Payer: Commercial Indemnity

## 2011-04-28 DIAGNOSIS — J189 Pneumonia, unspecified organism: Secondary | ICD-10-CM

## 2011-04-28 DIAGNOSIS — E119 Type 2 diabetes mellitus without complications: Secondary | ICD-10-CM

## 2011-04-28 DIAGNOSIS — K2270 Barrett's esophagus without dysplasia: Secondary | ICD-10-CM | POA: Insufficient documentation

## 2011-04-28 DIAGNOSIS — I10 Essential (primary) hypertension: Secondary | ICD-10-CM

## 2011-04-28 DIAGNOSIS — F17200 Nicotine dependence, unspecified, uncomplicated: Secondary | ICD-10-CM

## 2011-04-28 LAB — BASIC METABOLIC PANEL
BUN: 12 mg/dL (ref 6–23)
Chloride: 99 mEq/L (ref 96–112)
GFR: 96.1 mL/min (ref >60.00)
Potassium: 3.5 mEq/L (ref 3.5–5.1)
Sodium: 137 mEq/L (ref 135–145)

## 2011-04-28 LAB — HEMOGLOBIN A1C: Hgb A1c MFr Bld: 7.1 % — ABNORMAL HIGH (ref 4.6–6.5)

## 2011-04-28 MED ORDER — VARENICLINE TARTRATE 0.5 MG X 11 & 1 MG X 42 PO MISC: ORAL | Status: AC

## 2011-04-28 NOTE — Assessment & Plan Note (Addendum)
Labs pending  Diet and exercise discussed  Adjust med according to lab results

## 2011-04-28 NOTE — Assessment & Plan Note (Addendum)
Clinically improving will follow up cxr today  Plan:  mucinex dm As needed   Fluids and rest.  Please contact office for sooner follow up if symptoms do not improve or worsen or seek emergency care

## 2011-04-28 NOTE — Progress Notes (Signed)
Subjective:    Patient ID: [name], female    DOB: 1960/12/19, 50 y.o.   MRN: [id number]  HPI 50 yo smoker with known hx of HTN, DM , gastroesophageal reflux, and severe degenerative disk disease status post surgery multiple times, and hyperlipidemia   3/09 back surgery Dr Dutch Quint pain in back and legs barely tolerable, full disability.   May 07, 2009 -ov . A1C 6.2 and LDL at goal 79, TC 153. Since last visit doing better w/ decreased pain, increased sensation in hands. Husband is working out of town, may be moving to [place] for his work in next year.e   September 15, 2009-- new med calendar - Pt o chest pain and "bubbles in stomach". She states that chest pain is sharp and dull and worsens after eating and at bedtime. She states that she feels like "bubbles in chest". still having occasional low grade temps up to 101.2. States that she has had intermittent n/v/d but non in the past week. c/o increased fluid retention more in the evening. lab , cxr and [place] reviewed from last visit. which were essentially unremarkable. rx zegerid, resolved.   December 22, 2009 3 month followup. Pt c/o right ear pain x 2 days. She states "head feels congested". She also c/o hands and feet swelling and sometimes face swells.   January 25, 2010--Returns for 1 month follow up and labs. Last visit complained of fluid retention, actos stopped d/t fluid retention, started on metformin, sugars better. --avg fasting bs 120-164. dx with uti e coli rx cipro and f/u gyn u/a reported to be normal.   May 20, 2010 Acute visit. Pt c/o worsening GI problems since 08/2009 cc intermittents vomiting and upper abd pain- worse over the past 10 days. She states that the only thing she can eat is potatoes and yogurt and now not able to keep down fluids but states sugars running 80's fasting.  Epigrastic discomfort has waxed and waned , worse with eating w/in 25 min, occ with with n/v/d which are now more persistent and pain more  severe. no radiation of pain or dysuria/ urgency. tried increase ppi to three times a day no better >>refer to GI  04/28/11 Follow up  Pt presents for multiple issues and follow up . Pt informs me since her last visit she has underwent a Cholecystectomy. Had an extensive GI workup last year with endo and colon which showed Barrett's Esophagus. She is due for follow up with Dr. Juanda Chance next month. She says she underwent a HIDA scan that showed her GB was not fxn properly.   Complains that she got sick with URI like symptoms 2 weeks ago. Had cough, congestion, fever and malaise. Went to UC and was given zpack and keflex . She says she was told she had PNA on xray. She is feeling some better with decreased cough and congestion but not totally back to her baseline. Cough has not totally resolved.Mucus is clear .  She is very interested in smoking cesstation using Chantix. We discussed all the potential side effects including the severe warning . She is aware.   Also after her last steroid injection in her back she had uncontrollable blood sugars . She is not planning anymore steroid injections for the future.   Blood sugars at home are improved.    Past Medical History:  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)  GERD (ICD-530.81)  >endo 2011 [place] >Barrett's Esophagus  HYPERTENSION (ICD-401.9)  DEGENERATIVE DISC DISEASE (ICD-722.6)  DIABETES MELLITUS (ICD-250.00  HYPERLIPIDEMIA (ICD-272.4)  SYNCOPE (ICD-780.2)  OBESITY (ICD-278.00)  -peak all-time weight 242 December 22, 2009  - target to get BMI < 30 = 179  - referral to nutrition 05/19/2008 > refer again December 22, 2009  Depression  Anxiety  Right Upper quadrant pain  - onset 08/31/2009  - ABD u/s September 08, 2009 > neg  - refer to GI May 20, 2010 / try off ppi and metformin  - s/p cholecystectomy 2011 (HIDA scan GB not fxn prop) Health Maintenance.......................................................Marland KitchenWert  - [facility]     Review of  Systems Constitutional:   No  weight loss, night sweats,  Fevers, chills, fatigue, or  lassitude.  HEENT:   No headaches,  Difficulty swallowing,  Tooth/dental problems, or  Sore throat,                No sneezing, itching, ear ache, nasal congestion, post nasal drip,   CV:  No chest pain,  Orthopnea, PND, swelling in lower extremities, anasarca, dizziness, palpitations, syncope.   GI  No heartburn, indigestion, abdominal pain, nausea, vomiting, diarrhea, change in bowel habits, loss of appetite, bloody stools.   Resp:   No coughing up of blood.    No chest wall deformity  Skin: no rash or lesions.  GU: no dysuria, change in color of urine, no urgency or frequency.  No flank pain, no hematuria   MS:  No joint pain or swelling.    Psych:  No change in mood or affect. No depression or anxiety.  No memory loss.         Objective:   Physical Exam GEN: A/Ox3; pleasant , NAD, obese   HEENT:  [place]/AT,  EACs-clear, TMs-wnl, NOSE-clear, THROAT-clear, no lesions, no postnasal drip or exudate noted.   NECK:  Supple w/ fair ROM; no JVD; normal carotid impulses w/o bruits; no thyromegaly or nodules palpated; no lymphadenopathy.  RESP  Clear  P & A; w/o, wheezes/ rales/ or rhonchi.no accessory muscle use, no dullness to percussion  CARD:  RRR, no m/r/g  , no peripheral edema, pulses intact, no cyanosis or clubbing.  GI:   Soft & nt; nml bowel sounds; no organomegaly or masses detected.  Musco: Warm bil, no deformities or joint swelling noted.   Neuro: alert, no focal deficits noted.    Skin: Warm, no lesions or rashes         Assessment & Plan:  a

## 2011-04-28 NOTE — Patient Instructions (Signed)
We are so proud of you for deciding to quit smoking  I have sent prescription for Chantix to pharmacy. - we know you can do it.  Use mucinex DM Twice daily  As needed  Cough/congestion  Fluids and rest.  I will call with xray and labs results.  Low sweet diet.  Please contact office for sooner follow up if symptoms do not improve or worsen or seek emergency care  follow up Dr. Sherene Sires  In 4-6 weeks for physical-come fasting for labs.

## 2011-04-29 ENCOUNTER — Other Ambulatory Visit: Payer: Self-pay | Admitting: *Deleted

## 2011-05-03 NOTE — Assessment & Plan Note (Signed)
Smoking cesstation discussed  rx for chantix today with pt education

## 2011-05-03 NOTE — Assessment & Plan Note (Signed)
Set up follow up with GI

## 2011-05-03 NOTE — Assessment & Plan Note (Signed)
Controlled  Cont current regimen

## 2011-05-30 ENCOUNTER — Other Ambulatory Visit (INDEPENDENT_AMBULATORY_CARE_PROVIDER_SITE_OTHER): Payer: Commercial Indemnity

## 2011-05-30 ENCOUNTER — Ambulatory Visit (INDEPENDENT_AMBULATORY_CARE_PROVIDER_SITE_OTHER): Payer: Commercial Indemnity | Admitting: Internal Medicine

## 2011-05-30 ENCOUNTER — Encounter: Payer: Self-pay | Admitting: Internal Medicine

## 2011-05-30 ENCOUNTER — Other Ambulatory Visit (INDEPENDENT_AMBULATORY_CARE_PROVIDER_SITE_OTHER): Payer: Commercial Indemnity | Admitting: Internal Medicine

## 2011-05-30 VITALS — BP 100/72 | HR 92 | Temp 98.4°F | Ht 67.0 in | Wt 218.4 lb

## 2011-05-30 DIAGNOSIS — Z23 Encounter for immunization: Secondary | ICD-10-CM

## 2011-05-30 DIAGNOSIS — R197 Diarrhea, unspecified: Secondary | ICD-10-CM

## 2011-05-30 DIAGNOSIS — Z0000 Encounter for general adult medical examination without abnormal findings: Secondary | ICD-10-CM

## 2011-05-30 DIAGNOSIS — IMO0002 Reserved for concepts with insufficient information to code with codable children: Secondary | ICD-10-CM

## 2011-05-30 DIAGNOSIS — E669 Obesity, unspecified: Secondary | ICD-10-CM

## 2011-05-30 DIAGNOSIS — I10 Essential (primary) hypertension: Secondary | ICD-10-CM

## 2011-05-30 DIAGNOSIS — E785 Hyperlipidemia, unspecified: Secondary | ICD-10-CM

## 2011-05-30 DIAGNOSIS — F17200 Nicotine dependence, unspecified, uncomplicated: Secondary | ICD-10-CM

## 2011-05-30 DIAGNOSIS — E119 Type 2 diabetes mellitus without complications: Secondary | ICD-10-CM

## 2011-05-30 LAB — URINALYSIS, ROUTINE W REFLEX MICROSCOPIC
Ketones, ur: NEGATIVE
Total Protein, Urine: NEGATIVE
Urine Glucose: NEGATIVE
Urobilinogen, UA: 0.2 (ref 0.0–1.0)

## 2011-05-30 LAB — HEPATIC FUNCTION PANEL
ALT: 31 U/L (ref 0–35)
Albumin: 4.5 g/dL (ref 3.5–5.2)
Bilirubin, Direct: 0.1 mg/dL (ref 0.0–0.3)
Total Protein: 7.2 g/dL (ref 6.0–8.3)

## 2011-05-30 LAB — CBC WITH DIFFERENTIAL/PLATELET
Basophils Relative: 0.5 % (ref 0.0–3.0)
Eosinophils Absolute: 0.5 10*3/uL (ref 0.0–0.7)
Eosinophils Relative: 5.3 % — ABNORMAL HIGH (ref 0.0–5.0)
HCT: 40.4 % (ref 36.0–46.0)
Hemoglobin: 13.7 g/dL (ref 12.0–15.0)
Lymphs Abs: 2.1 10*3/uL (ref 0.7–4.0)
MCHC: 33.8 g/dL (ref 30.0–36.0)
MCV: 94.7 fl (ref 78.0–100.0)
Monocytes Absolute: 0.4 10*3/uL (ref 0.1–1.0)
Neutro Abs: 6.6 10*3/uL (ref 1.4–7.7)
Neutrophils Relative %: 68.8 % (ref 43.0–77.0)
RBC: 4.27 Mil/uL (ref 3.87–5.11)
WBC: 9.6 10*3/uL (ref 4.5–10.5)

## 2011-05-30 LAB — BASIC METABOLIC PANEL
CO2: 30 mEq/L (ref 19–32)
Chloride: 103 mEq/L (ref 96–112)
Creatinine, Ser: 0.7 mg/dL (ref 0.4–1.2)
Potassium: 3.4 mEq/L — ABNORMAL LOW (ref 3.5–5.1)

## 2011-05-30 LAB — TSH: TSH: 1.07 u[IU]/mL (ref 0.35–5.50)

## 2011-05-30 MED ORDER — TETANUS-DIPHTH-ACELL PERTUSSIS 5-2.5-18.5 LF-MCG/0.5 IM SUSP: 0.5000 mL | Freq: Once | INTRAMUSCULAR | Status: DC

## 2011-05-30 NOTE — Assessment & Plan Note (Signed)
Should be able to control with metformin titrated to max of 3 gm a day as long as not on steroids and following reasonable diet

## 2011-05-30 NOTE — Progress Notes (Signed)
Subjective:     Patient ID: [name], female   DOB: 05-Oct-1961, 50 y.o.   MRN: [id number]  HPI 75 yowf smoker with morbid obesity complicated by hypertension, diabetes  mellitus, gastroesophageal reflux, and severe degenerative disk disease status post surgery multiple times, and hyperlipidemia   3/09 back surgery Dr Dutch Quint pain in back and legs barely tolerable, full disability.   May 07, 2009 -ov . A1C 6.2 and LDL at goal 79, TC 153. Since last visit doing better w/ decreased pain, increased sensation in hands. Husband is working out of town, may be moving to [place] for his work in next year.  September 15, 2009-- new med calendar - Pt o chest pain and "bubbles in stomach". She states that chest pain is sharp and dull and worsens after eating and at bedtime. She states that she feels like "bubbles in chest". still having occasional low grade temps up to 101.2. States that she has had intermittent n/v/d but non in the past week. c/o increased fluid retention more in the evening. lab , cxr and [place] reviewed from last visit. which were essentially unremarkable. rx zegerid, resolved.   January 25, 2010--Returns for 1 month follow up and labs. Last visit complained of fluid retention, actos stopped d/t fluid retention, started on metformin, sugars better. --avg fasting bs 120-164. dx with uti e coli rx cipro and f/u gyn u/a reported to be normal.   Cholecystectomy Sep 2012  05/30/2011  Ov/Kawanna Christley cc no more than two vicodin per day,  Diarrhea on amaryl and wants off.  Sugars very high only while on steroid shots but no longer taking them. No overt hypo or hyperglycemic symptoms.  Pt denies any significant sore throat, dysphagia, itching, sneezing,  nasal congestion or excess/ purulent secretions,  fever, chills, sweats, unintended wt loss, pleuritic or exertional cp, hempoptysis, orthopnea pnd or leg swelling.    Also denies any obvious fluctuation of symptoms with weather or environmental changes or  other aggravating or alleviating factors.    Allergies 1) ! Penicillin  2) ! Sulfa  3) ! Erythromycin  4) ! Floxin  5) ! * Latex  :  Past Medical History:  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)  GERD (ICD-530.81)  HYPERTENSION (ICD-401.9)  DEGENERATIVE DISC DISEASE (ICD-722.6)  Back surgery 07/02/2004:   L5, S1 decompression and fusion with  instrumentation. DIABETES MELLITUS (ICD-250.00  HYPERLIPIDEMIA (ICD-272.4)  - Target LDL < 70 due to DM SYNCOPE (ICD-780.2)  OBESITY (ICD-278.00)  -peak all-time weight 242 December 22, 2009  - target to get BMI < 30 = 179  - referral to nutrition 05/19/2008 > refer again December 22, 2009  Depression  Anxiety  Right Upper quadrant pain  - onset 08/31/2009  - ABD u/s September 08, 2009 > neg  - Cholecystectomy 07/2011 > resolved Health Maintenance.......................................................Marland KitchenWert  - [facility] - 05/30/2011 CPX     Review of Systems  Constitutional: Negative for fever, chills, diaphoresis, activity change, appetite change, fatigue and unexpected weight change.  HENT: Negative for hearing loss, ear pain, nosebleeds, congestion, sore throat, facial swelling, rhinorrhea, sneezing, mouth sores, trouble swallowing, neck pain, neck stiffness, dental problem, voice change, postnasal drip, sinus pressure, tinnitus and ear discharge.   Eyes: Negative for photophobia, discharge, itching and visual disturbance.  Respiratory: Negative for apnea, cough, choking, chest tightness, shortness of breath, wheezing and stridor.   Cardiovascular: Negative for chest pain, palpitations and leg swelling.  Gastrointestinal: Positive for abdominal pain and diarrhea. Negative for nausea, vomiting, constipation, blood  in stool and abdominal distention.  Genitourinary: Negative for dysuria, urgency, frequency, hematuria, flank pain, decreased urine volume and difficulty urinating.  Musculoskeletal: Positive for back pain. Negative for myalgias, joint  swelling, arthralgias and gait problem.  Skin: Negative for color change, pallor and rash.  Neurological: Negative for dizziness, tremors, seizures, syncope, speech difficulty, weakness, light-headedness, numbness and headaches.  Hematological: Negative for adenopathy. Does not bruise/bleed easily.  Psychiatric/Behavioral: Negative for confusion, sleep disturbance and agitation. The patient is not nervous/anxious.        Objective:   Physical Exam amb deeply tanned wf  Wt 218 05/30/2011 HEENT: nl dentition, turbinates, and orophanx. Nl external ear canals without cough reflex   NECK :  without JVD/Nodes/TM/ nl carotid upstrokes bilaterally   LUNGS: no acc muscle use, clear to A and P bilaterally without cough on insp or exp maneuvers   CV:  RRR  no s3 or murmur or increase in P2, no edema   ABD:  soft and nontender with nl excursion in the supine position. No bruits or organomegaly, bowel sounds nl  MS:  warm without deformities, calf tenderness, cyanosis or clubbing  SKIN: warm and dry without lesions    NEURO:  alert, approp, no deficits      Assessment:         Plan:

## 2011-05-30 NOTE — Assessment & Plan Note (Signed)
Calorie balance issues reviewed 

## 2011-05-30 NOTE — Assessment & Plan Note (Signed)
Adequate control on present rx, reviewed  

## 2011-05-30 NOTE — Assessment & Plan Note (Signed)
Encouraged to follow through on rx for chantix and quit date this week

## 2011-05-30 NOTE — Patient Instructions (Addendum)
Congratulations on your smoking cessation efforts  See calendar for specific medication instructions and bring it back for each and every office visit for every healthcare provider you see.  Without it,  you may not receive the best quality medical care that we feel you deserve.  You will note that the calendar groups together  your maintenance  medications that are timed at particular times of the day.  Think of this as your checklist for what your doctor has instructed you to do until your next evaluation to see what benefit  there is  to staying on a consistent group of medications intended to keep you well.  The other group at the bottom is entirely up to you to use as you see fit  for specific symptoms that may arise between visits that require you to treat them on an as needed basis.  Think of this as your action plan or "what if" list.   Separating the top medications from the bottom group is fundamental to providing you adequate care going forward.    Stop amaryl and see if diarrhea resolves  We will call you with your lab results  If you need another calendar you should see Tammy instead of me in 3 months with all meds in hand

## 2011-05-30 NOTE — Assessment & Plan Note (Signed)
?   Related to amaryl, try off  See instructions for specific recommendations which were reviewed directly with the patient who was given a copy with highlighter outlining the key components.

## 2011-05-30 NOTE — Assessment & Plan Note (Signed)
As long as limits vicodin to less than 3 per day I can refill it here if Dr Dutch Quint agrees

## 2011-06-01 LAB — MICROALBUMIN / CREATININE URINE RATIO
Creatinine,U: 152.4 mg/dL
Microalb, Ur: 2 mg/dL — ABNORMAL HIGH (ref 0.0–1.9)

## 2011-06-02 NOTE — Progress Notes (Signed)
Quick Note:  Spoke with pt and notified of results per Dr. Wert. Pt verbalized understanding and denied any questions.  ______ 

## 2011-07-01 ENCOUNTER — Telehealth: Payer: Self-pay | Admitting: Internal Medicine

## 2011-07-01 NOTE — Telephone Encounter (Signed)
[id number] 

## 2011-07-05 MED ORDER — HYDROCODONE-ACETAMINOPHEN 5-500 MG PO TABS: ORAL_TABLET | ORAL | Status: DC

## 2011-07-05 NOTE — Telephone Encounter (Signed)
Needs ov w/in 4 weeks Give her enough vicodin at 3 per day to last until that ov then regroup

## 2011-07-05 NOTE — Telephone Encounter (Signed)
Returning call can be reached at [phone].[name]

## 2011-07-05 NOTE — Telephone Encounter (Signed)
Pt returned call.  She is aware MW will need to see her within 4 wks to regroup but in the meantime he is ok with calling in enough vicodin at 3 per day to last until OV.  She is ok with this plan.  OV scheduled for Aug 01, 2011 at 9:15 am with MW, [facility] [address] -- Maanvi aware.  [facility] who verbalized understanding.

## 2011-07-05 NOTE — Telephone Encounter (Signed)
Called [phone] but it was not excepting calls at this time.  Called [phone] - [email or web address]

## 2011-07-05 NOTE — Telephone Encounter (Signed)
Spoke with pt. She states that she is needing refill on her vicodin. She states that at her last visit MW had agreed to take over her pain management, she was referred to pain management by Dr Dutch Quint and she won't be going back there b/c they wanted her to take too many pain pills. She states only takes 2-3 vicodin per day, and sometimes does not require any med at all. MW, pls advise if okay to refill this for her thanks!

## 2011-07-14 ENCOUNTER — Telehealth: Payer: Self-pay | Admitting: Internal Medicine

## 2011-07-14 MED ORDER — AMLODIPINE BESYLATE 5 MG PO TABS: 5.0000 mg | ORAL_TABLET | Freq: Every day | ORAL | Status: DC

## 2011-07-14 MED ORDER — HYDROCHLOROTHIAZIDE 25 MG PO TABS: 25.0000 mg | ORAL_TABLET | Freq: Every day | ORAL | Status: DC

## 2011-07-14 MED ORDER — FLUOXETINE HCL 40 MG PO CAPS: 40.0000 mg | ORAL_CAPSULE | Freq: Every day | ORAL | Status: DC

## 2011-07-14 NOTE — Telephone Encounter (Signed)
Spoke with pt and notified that rxs that she requested have been sent to pharm.

## 2011-07-25 LAB — POCT I-STAT 4, (NA,K, GLUC, HGB,HCT)
HCT: 33 — ABNORMAL LOW
Operator id: 147011
Potassium: 3.2 — ABNORMAL LOW
Sodium: 136

## 2011-07-25 LAB — CBC
HCT: 41.1
Hemoglobin: 14
MCHC: 34.1
MCV: 92.9
RBC: 4.42
RDW: 12.9
WBC: 8.5

## 2011-07-25 LAB — DIFFERENTIAL
Lymphs Abs: 2.3
Monocytes Absolute: 0.5
Monocytes Relative: 6
Neutro Abs: 5.3
Neutrophils Relative %: 62

## 2011-07-25 LAB — BASIC METABOLIC PANEL
Calcium: 10
Chloride: 102
Creatinine, Ser: 0.73
GFR calc Af Amer: >60
Sodium: 141

## 2011-07-25 LAB — TYPE AND SCREEN: Antibody Screen: NEGATIVE

## 2011-07-29 LAB — DIFFERENTIAL
Basophils Relative: 1
Monocytes Absolute: 0.7
Monocytes Relative: 7
Neutro Abs: 6.4

## 2011-07-29 LAB — CBC
HCT: 33.5 — ABNORMAL LOW
Hemoglobin: 11.8 — ABNORMAL LOW
MCHC: 35.1
MCV: 94.3
RBC: 3.56 — ABNORMAL LOW

## 2011-07-29 LAB — BASIC METABOLIC PANEL
CO2: 25
Calcium: 9.5
Chloride: 103
GFR calc Af Amer: >60
Potassium: 3.3 — ABNORMAL LOW
Sodium: 136

## 2011-08-01 ENCOUNTER — Encounter: Payer: Self-pay | Admitting: Internal Medicine

## 2011-08-01 ENCOUNTER — Ambulatory Visit (INDEPENDENT_AMBULATORY_CARE_PROVIDER_SITE_OTHER): Payer: Commercial Indemnity | Admitting: Internal Medicine

## 2011-08-01 DIAGNOSIS — E119 Type 2 diabetes mellitus without complications: Secondary | ICD-10-CM

## 2011-08-01 DIAGNOSIS — E785 Hyperlipidemia, unspecified: Secondary | ICD-10-CM

## 2011-08-01 DIAGNOSIS — F17200 Nicotine dependence, unspecified, uncomplicated: Secondary | ICD-10-CM

## 2011-08-01 DIAGNOSIS — IMO0002 Reserved for concepts with insufficient information to code with codable children: Secondary | ICD-10-CM

## 2011-08-01 DIAGNOSIS — I10 Essential (primary) hypertension: Secondary | ICD-10-CM

## 2011-08-01 MED ORDER — HYDROCODONE-ACETAMINOPHEN 5-500 MG PO TABS: ORAL_TABLET | ORAL | Status: DC

## 2011-08-01 NOTE — Patient Instructions (Addendum)
See Tammy NP w/in 2 weeks with all your medications, even over the counter meds, separated in two separate bags, the ones you take no matter what vs the ones you stop once you feel better and take only as needed when you feel you need them.   Tammy  will generate for you a new user friendly medication calendar that will put [place] all on the same page re: your medication use.     Without this process, it simply isn't possible to assure that we are providing  your outpatient care  with  the attention to detail we feel you deserve.   If we cannot assure that you're getting that kind of care,  then we cannot manage your problem effectively from this clinic.  Tammy will check your fasting labs including HbgA1c and lipid profile  Once you have seen Tammy and we are sure that we're all on the same page with your medication use she will arrange follow up with me.   For stress ok to use valium rather than smoke  For pain take vicodin up to 3 per day if needed

## 2011-08-01 NOTE — Progress Notes (Signed)
Subjective:     Patient ID: [name], female   DOB: [birth year], 50 y.o.   MRN: [id number]  HPI 76 yowf smoker with morbid obesity complicated by hypertension, diabetes  mellitus, gastroesophageal reflux, and severe degenerative disk disease status post surgery multiple times, and hyperlipidemia   3/09 back surgery Dr Dutch Quint pain in back and legs barely tolerable, full disability.   September 15, 2009-- new med calendar - Pt o chest pain and "bubbles in stomach". She states that chest pain is sharp and dull and worsens after eating and at bedtime. She states that she feels like "bubbles in chest". still having occasional low grade temps up to 101.2. States that she has had intermittent n/v/d but non in the past week. c/o increased fluid retention more in the evening. lab , cxr and [place] reviewed from last visit. which were essentially unremarkable. rx zegerid, resolved.   January 25, 2010--Returns for 1 month follow up and labs. Last visit complained of fluid retention, actos stopped d/t fluid retention, started on metformin, sugars better. --avg fasting bs 120-164. dx with uti e coli rx cipro and f/u gyn u/a reported to be normal.   Cholecystectomy Sep 2012  05/30/2011  Ov/Wert cc no more than two vicodin per day,  Diarrhea on amaryl and wants off.  Sugars very high only while on steroid shots but no longer taking them. No overt hypo or hyperglycemic symptoms. Congratulations on your smoking cessation efforts See calendar for specific medication instructions and bring it back for each and every office visit for every healthcare provider you see.     Stop amaryl and see if diarrhea resolves> resolved with sugars in 120 range fasting    08/01/2011 f/u ov/Wert diarrhea resolved off amaryl and sugars ok on present rx,  cc understood she needed to take 6 percocet per day or be discharged from pain clinic by [facility] clinic and refused to go back and requests refills through this clinic.  Did not bring  calendar. Only needs 2-3 vicodin per day. Smoking 2-3 per week with stress.  Pt denies any significant sore throat, dysphagia, itching, sneezing,  nasal congestion or excess/ purulent secretions,  fever, chills, sweats, unintended wt loss, pleuritic or exertional cp, hempoptysis, orthopnea pnd or leg swelling.    Also denies any obvious fluctuation of symptoms with weather or environmental changes or other aggravating or alleviating factors.    Allergies 1) ! Penicillin  2) ! Sulfa  3) ! Erythromycin  4) ! Floxin  5) ! * Latex    Past Medical History:  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)  GERD (ICD-530.81)  HYPERTENSION (ICD-401.9)  DEGENERATIVE DISC DISEASE (ICD-722.6)  Back surgery 07/02/2004:   L5, S1 decompression and fusion with  instrumentation. DIABETES MELLITUS (ICD-250.00  HYPERLIPIDEMIA (ICD-272.4)  - Target LDL < 70 due to DM SYNCOPE (ICD-780.2)  OBESITY (ICD-278.00)  -peak all-time weight 242 December 22, 2009  - target to get BMI < 30 = 179  - referral to nutrition 05/19/2008 > refer again December 22, 2009  Depression  Anxiety  Right Upper quadrant pain  - onset 08/31/2009  - ABD u/s September 08, 2009 > neg  - Cholecystectomy 07/2011 > resolved Health Maintenance.......................................................Marland KitchenWert  - [facility] - 05/30/2011 CPX             Objective:   Physical Exam  amb deeply tanned wf   Wt 218 05/30/2011 > 216  08/01/2011   HEENT: nl dentition, turbinates, and orophanx. Nl external ear canals without  cough reflex   NECK :  without JVD/Nodes/TM/ nl carotid upstrokes bilaterally   LUNGS: no acc muscle use, clear to A and P bilaterally without cough on insp or exp maneuvers   CV:  RRR  no s3 or murmur or increase in P2, no edema   ABD:  soft and nontender with nl excursion in the supine position. No bruits or organomegaly, bowel sounds nl  MS:  warm without deformities, calf tenderness, cyanosis or clubbing  SKIN: warm and  dry without lesions    NEURO:  alert, approp, no deficits      Assessment:         Plan:

## 2011-08-02 ENCOUNTER — Telehealth: Payer: Self-pay | Admitting: *Deleted

## 2011-08-02 ENCOUNTER — Encounter: Payer: Self-pay | Admitting: Internal Medicine

## 2011-08-02 MED ORDER — EZETIMIBE-SIMVASTATIN 10-40 MG PO TABS: 1.0000 | ORAL_TABLET | Freq: Every day | ORAL | Status: DC

## 2011-08-02 NOTE — Telephone Encounter (Signed)
Message copied by Christen Butter on Tue Aug 02, 2011  1:22 PM ------      Message from: Nyoka Cowden      Created: Tue Aug 02, 2011  6:54 AM       Need last note from [place] and her pain doctor (she says they were requested but I can't find them in the emr)

## 2011-08-02 NOTE — Assessment & Plan Note (Signed)
Discussed using valium for anxiety, not cigs

## 2011-08-02 NOTE — Telephone Encounter (Signed)
Called and spoke with Medical Records Dept at Dr [facility] office and requested last ov note to be faxed to the triage faxline.

## 2011-08-02 NOTE — Assessment & Plan Note (Signed)
Ok for now [employer] - Due for 3 month recheck but this needs to be done in the context of general review with full med rec/ med calendar review  Needs more attention to wt loss

## 2011-08-02 NOTE — Assessment & Plan Note (Signed)
Adequate control on present rx, reviewed  

## 2011-08-02 NOTE — Telephone Encounter (Signed)
Note received, but not the correct one. Per MW, needs the note from the doc that had advised her to take the 6 vicodin per day. I called and spoke with pt. She states that she does not recall physicians name, but she saw him at the [facility] in [place] and the number there is [phone]. She states that the date of last visit there was 05/18/11. I called there and was advised that she will need to sign a release of records to get this. I spoke with pt and she states will go there tomorrow and get her records from there and hand deliver to me.

## 2011-08-02 NOTE — Assessment & Plan Note (Signed)
Reviewed options but declines referral back to pain clinic and says Dr Dutch Quint has told her nothing else to offer so ok to refill vicodin here with limit of 3 per day agreed until I obtain specific recs from Dr Dutch Quint and pain clinic.

## 2011-08-02 NOTE — Assessment & Plan Note (Signed)
Needs recheck next ov 

## 2011-08-09 ENCOUNTER — Telehealth: Payer: Self-pay | Admitting: Internal Medicine

## 2011-08-09 LAB — CBC
HCT: 41.4
MCV: 93.9
Platelets: 380
RDW: 13.2

## 2011-08-09 LAB — BASIC METABOLIC PANEL
BUN: 11
GFR calc non Af Amer: >60
Glucose, Bld: 114 — ABNORMAL HIGH
Potassium: 3.7

## 2011-08-09 NOTE — Telephone Encounter (Signed)
Aware. 

## 2011-08-09 NOTE — Telephone Encounter (Signed)
Spoke with pt. She states called the pain clinic to have her records released to her and she was told would be ready the following day. She went there today to sign release and was informed by the medical records dept there that her records were in [place] and they would contact her once ready to be picked up. She brought me a copy of the release form that she signed. Will forward to MW so that he is aware.

## 2011-08-17 ENCOUNTER — Other Ambulatory Visit: Payer: Self-pay | Admitting: Internal Medicine

## 2011-08-22 ENCOUNTER — Other Ambulatory Visit: Payer: Self-pay | Admitting: Gynecology

## 2011-08-22 DIAGNOSIS — N6310 Unspecified lump in the right breast, unspecified quadrant: Secondary | ICD-10-CM

## 2011-08-25 ENCOUNTER — Telehealth: Payer: Self-pay | Admitting: Internal Medicine

## 2011-08-25 NOTE — Telephone Encounter (Signed)
LMOM for [name] to call back to get more details.

## 2011-08-26 NOTE — Telephone Encounter (Signed)
lmomtcb  

## 2011-08-29 NOTE — Telephone Encounter (Signed)
[id number]. Zeriyah Wain, CMA  

## 2011-08-30 NOTE — Telephone Encounter (Signed)
Per protocol after 3 calls message to be closed. Will await call back. Carron Curie, CMA

## 2011-09-01 ENCOUNTER — Other Ambulatory Visit: Payer: Self-pay | Admitting: Adult Health

## 2011-09-01 ENCOUNTER — Ambulatory Visit
Admission: RE | Admit: 2011-09-01 | Discharge: 2011-09-01 | Disposition: A | Discharge status: Home / Self Care | Payer: Commercial Indemnity | Source: Ambulatory Visit | Attending: Gynecology | Admitting: Gynecology

## 2011-09-01 ENCOUNTER — Encounter: Payer: Commercial Indemnity | Admitting: Adult Health

## 2011-09-01 DIAGNOSIS — N6310 Unspecified lump in the right breast, unspecified quadrant: Secondary | ICD-10-CM

## 2011-09-01 DIAGNOSIS — E785 Hyperlipidemia, unspecified: Secondary | ICD-10-CM

## 2011-09-01 DIAGNOSIS — E119 Type 2 diabetes mellitus without complications: Secondary | ICD-10-CM

## 2011-09-02 ENCOUNTER — Encounter: Payer: Commercial Indemnity | Admitting: Adult Health

## 2011-09-05 ENCOUNTER — Encounter: Payer: Commercial Indemnity | Admitting: Adult Health

## 2011-09-09 ENCOUNTER — Other Ambulatory Visit (INDEPENDENT_AMBULATORY_CARE_PROVIDER_SITE_OTHER): Payer: Commercial Indemnity

## 2011-09-09 ENCOUNTER — Encounter: Payer: Self-pay | Admitting: Adult Health

## 2011-09-09 ENCOUNTER — Ambulatory Visit (INDEPENDENT_AMBULATORY_CARE_PROVIDER_SITE_OTHER): Payer: Commercial Indemnity | Admitting: Adult Health

## 2011-09-09 DIAGNOSIS — E119 Type 2 diabetes mellitus without complications: Secondary | ICD-10-CM

## 2011-09-09 DIAGNOSIS — Z23 Encounter for immunization: Secondary | ICD-10-CM

## 2011-09-09 DIAGNOSIS — IMO0002 Reserved for concepts with insufficient information to code with codable children: Secondary | ICD-10-CM

## 2011-09-09 DIAGNOSIS — E785 Hyperlipidemia, unspecified: Secondary | ICD-10-CM

## 2011-09-09 DIAGNOSIS — I10 Essential (primary) hypertension: Secondary | ICD-10-CM

## 2011-09-09 LAB — BASIC METABOLIC PANEL
CO2: 31 mEq/L (ref 19–32)
Chloride: 99 mEq/L (ref 96–112)
Glucose, Bld: 112 mg/dL — ABNORMAL HIGH (ref 70–99)
Potassium: 3.7 mEq/L (ref 3.5–5.1)
Sodium: 139 mEq/L (ref 135–145)

## 2011-09-09 LAB — LIPID PANEL
HDL: 45.4 mg/dL (ref >39.00)
LDL Cholesterol: 63 mg/dL (ref 0–99)
Total CHOL/HDL Ratio: 3
Triglycerides: 134 mg/dL (ref 0.0–149.0)
VLDL: 26.8 mg/dL (ref 0.0–40.0)

## 2011-09-09 LAB — HEPATIC FUNCTION PANEL
AST: 27 U/L (ref 0–37)
Albumin: 4.5 g/dL (ref 3.5–5.2)
Total Bilirubin: 0.9 mg/dL (ref 0.3–1.2)

## 2011-09-09 LAB — HEMOGLOBIN A1C: Hgb A1c MFr Bld: 6.9 % — ABNORMAL HIGH (ref 4.6–6.5)

## 2011-09-09 MED ORDER — HYDROCODONE-ACETAMINOPHEN 5-500 MG PO TABS: ORAL_TABLET | ORAL | Status: DC

## 2011-09-09 NOTE — Assessment & Plan Note (Signed)
Fasting labs today

## 2011-09-09 NOTE — Assessment & Plan Note (Signed)
Chronic back pain status post multiple back surgeries. Pain controlled on Vicodin.

## 2011-09-09 NOTE — Patient Instructions (Addendum)
Flu shot  Continue on current regimen I will call with lab results  follow up Dr. Sherene Sires  In 3 months  Keep trying to cut back on smoking until you have quit.

## 2011-09-09 NOTE — Progress Notes (Signed)
Subjective:     Patient ID: [name], female   DOB: [birth year], 50 y.o.   MRN: [id number]  HPI 87 yowf smoker with morbid obesity complicated by hypertension, diabetes  mellitus, gastroesophageal reflux, and severe degenerative disk disease status post surgery multiple times, and hyperlipidemia   3/09 back surgery Dr Dutch Quint pain in back and legs barely tolerable, full disability.   September 15, 2009-- new med calendar - Pt o chest pain and "bubbles in stomach". She states that chest pain is sharp and dull and worsens after eating and at bedtime. She states that she feels like "bubbles in chest". still having occasional low grade temps up to 101.2. States that she has had intermittent n/v/d but non in the past week. c/o increased fluid retention more in the evening. lab , cxr and [place] reviewed from last visit. which were essentially unremarkable. rx zegerid, resolved.   January 25, 2010--Returns for 1 month follow up and labs. Last visit complained of fluid retention, actos stopped d/t fluid retention, started on metformin, sugars better. --avg fasting bs 120-164. dx with uti e coli rx cipro and f/u gyn u/a reported to be normal.   Cholecystectomy Sep 2012  05/30/2011  Ov/Wert cc no more than two vicodin per day,  Diarrhea on amaryl and wants off.  Sugars very high only while on steroid shots but no longer taking them. No overt hypo or hyperglycemic symptoms. Congratulations on your smoking cessation efforts See calendar for specific medication instructions and bring it back for each and every office visit for every healthcare provider you see.     Stop amaryl and see if diarrhea resolves> resolved with sugars in 120 range fasting    08/01/2011 f/u ov/Wert diarrhea resolved off amaryl and sugars ok on present rx,  cc understood she needed to take 6 percocet per day or be discharged from pain clinic by [facility] clinic and refused to go back and requests refills through this clinic.  Did not bring  calendar. Only needs 2-3 vicodin per day. Smoking 2-3 per week with stress.  09/09/2011 Follow up   Patient returns for followup. She says, overall she's been doing well. She remains on metformin twice daily. Average blood sugars at home have been averaging between 140 and 150. Chronic back pain has been controlled with Vicodin on average 1-2 daily. She uses an occasional Valium on average about one to 2 a month for back spasm. She is cutting down on smoking and on average about 2-3 cigarettes a day.  She is fasting today and will need labs. She will also need her flu shot. She denies any chest pain, abdominal pain, hypoglycemia episodes, edema, or shortness of breath.  The patient is brought her medications in today reviewed each medicine and updated. Her medication count with patient education. It appears that she is taking her medications correctly.   Allergies 1) ! Penicillin  2) ! Sulfa  3) ! Erythromycin  4) ! Floxin  5) ! * Latex    Past Medical History:  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)  GERD (ICD-530.81)  HYPERTENSION (ICD-401.9)  DEGENERATIVE DISC DISEASE (ICD-722.6)  Back surgery 07/02/2004:   L5, S1 decompression and fusion with  instrumentation. DIABETES MELLITUS (ICD-250.00  HYPERLIPIDEMIA (ICD-272.4)  - Target LDL < 70 due to DM SYNCOPE (ICD-780.2)  OBESITY (ICD-278.00)  -peak all-time weight 242 December 22, 2009  - target to get BMI < 30 = 179  - referral to nutrition 05/19/2008 > refer again December 22, 2009  Depression  Anxiety  Right Upper quadrant pain  - onset 08/31/2009  - ABD u/s September 08, 2009 > neg  - Cholecystectomy 07/2011 > resolved Health Maintenance.......................................................Marland KitchenWert  - [facility] - 05/30/2011 CPX -Pnuemovax 05/2011  -complex med regimen >med calendar 09/09/2011     ROS:   Constitutional:   No  weight loss, night sweats,  Fevers, chills, fatigue, or  lassitude.  HEENT:   No headaches,  Difficulty  swallowing,  Tooth/dental problems, or  Sore throat,                No sneezing, itching, ear ache, nasal congestion, post nasal drip,   CV:  No chest pain,  Orthopnea, PND, swelling in lower extremities, anasarca, dizziness, palpitations, syncope.   GI  No heartburn, indigestion, abdominal pain, nausea, vomiting, diarrhea, change in bowel habits, loss of appetite, bloody stools.   Resp: No shortness of breath with exertion or at rest.  No excess mucus, no productive cough,  No non-productive cough,  No coughing up of blood.  No change in color of mucus.  No wheezing.  No chest wall deformity  Skin: no rash or lesions.  GU: no dysuria, change in color of urine, no urgency or frequency.  No flank pain, no hematuria   MS:  No joint   swelling.     Psych:  No change in mood or affect. No depression or anxiety.  No memory loss.              Objective:   Physical Exam  amb deeply tanned wf   Wt 218 05/30/2011 > 216  08/01/2011 >>219 09/09/2011   HEENT: nl dentition, turbinates, and orophanx. Nl external ear canals without cough reflex   NECK :  without JVD/Nodes/TM/ nl carotid upstrokes bilaterally   LUNGS: no acc muscle use, clear to A and P bilaterally without cough on insp or exp maneuvers   CV:  RRR  no s3 or murmur or increase in P2, no edema   ABD:  soft and nontender with nl excursion in the supine position. No bruits or organomegaly, bowel sounds nl  MS:  warm without deformities, calf tenderness, cyanosis or clubbing  SKIN: warm and dry without lesions    NEURO:  alert, approp, no deficits      Assessment:         Plan:

## 2011-09-09 NOTE — Progress Notes (Signed)
Addended by: Julaine Hua on: 09/09/2011 09:57 AM   Modules accepted: Orders

## 2011-09-09 NOTE — Assessment & Plan Note (Signed)
Fasting labs today with hemoglobin A1c.

## 2011-09-09 NOTE — Assessment & Plan Note (Signed)
Controlled on medications. Patient encouraged on diet and exercise Encouraged weight loss

## 2011-09-12 MED ORDER — ASPIRIN EC 81 MG PO TBEC: 81.0000 mg | DELAYED_RELEASE_TABLET | Freq: Every day | ORAL | Status: DC

## 2011-09-12 MED ORDER — SIMETHICONE 125 MG PO CAPS: ORAL_CAPSULE | ORAL | Status: DC

## 2011-09-12 MED ORDER — CALCIUM CARBONATE-VITAMIN D 600-400 MG-UNIT PO TABS: 1.0000 | ORAL_TABLET | Freq: Two times a day (BID) | ORAL | Status: DC

## 2011-09-12 MED ORDER — MULTIVITAMINS PO TABS: 1.0000 | ORAL_TABLET | Freq: Every day | ORAL | Status: DC

## 2011-09-12 MED ORDER — DIAZEPAM 5 MG PO TABS: ORAL_TABLET | ORAL | Status: DC

## 2011-09-12 MED ORDER — DICYCLOMINE HCL 20 MG PO TABS: 20.0000 mg | ORAL_TABLET | Freq: Two times a day (BID) | ORAL | Status: DC | PRN

## 2011-09-12 NOTE — Progress Notes (Signed)
Addended by: Boone Master E on: 09/12/2011 11:41 AM   Modules accepted: Orders

## 2011-09-12 NOTE — Progress Notes (Signed)
Addendum for 11.9.12 med calendar update.

## 2011-10-18 ENCOUNTER — Telehealth: Payer: Self-pay | Admitting: Allergy

## 2011-10-18 NOTE — Telephone Encounter (Signed)
Ok but no more than one twice daily and she got 45 days worth  11/9 so this this should be ok to do another 90

## 2011-10-18 NOTE — Telephone Encounter (Signed)
See prev note

## 2011-10-18 NOTE — Telephone Encounter (Signed)
[facility] [place] [address] HYDROCODON-ACETAMINOPHEN 5-500 TAKE 1-2 TABLETS BY MOUT EVERY 4-6  HOURS AS NEEDED Last fill 09-09-2011 DR Sherene Sires is this ok to refill  Allergies  Allergen Reactions  . Latex Anaphylaxis  . Penicillins Anaphylaxis  . Erythromycin Hives and Itching  . Ofloxacin Hives and Itching  . Sulfonamide Derivatives Hives and Itching

## 2011-10-19 MED ORDER — HYDROCODONE-ACETAMINOPHEN 5-500 MG PO TABS: ORAL_TABLET | ORAL | Status: DC

## 2011-10-19 NOTE — Telephone Encounter (Signed)
[facility] [address]

## 2011-11-01 HISTORY — PX: CARDIAC CATHETERIZATION: SHX172

## 2011-11-16 ENCOUNTER — Other Ambulatory Visit: Payer: Self-pay | Admitting: Internal Medicine

## 2011-11-22 ENCOUNTER — Telehealth: Payer: Self-pay | Admitting: Internal Medicine

## 2011-11-22 NOTE — Telephone Encounter (Signed)
Spoke with pt She states back pain is getting much worse Appt with MW for 9 am tomorrow- advised to seek emergency care should pain get worse She requests refill on vicodin- please advise if this is okay thanks!

## 2011-11-24 MED ORDER — HYDROCODONE-ACETAMINOPHEN 5-500 MG PO TABS: ORAL_TABLET | ORAL | Status: DC

## 2011-11-24 NOTE — Telephone Encounter (Signed)
Pt called back again. Wants vicodin called in today "since we may have bad weather tomorrow". [name]

## 2011-11-24 NOTE — Telephone Encounter (Signed)
Our agreement is that if she needs more than twice daily vicodin that I would not be able to continue to provide this and will be referring back to the pain clinic, which is where Dr Dutch Quint thought she should go in the first place  I don't mind one time refill but needs to be back in office to regroup on this issue before more

## 2011-11-24 NOTE — Telephone Encounter (Signed)
Pt is aware we will call in 1 refill on her Vicodin but she must keep her appt with MW on Fri., 11/25/11 @ 9am. Pt verbalized understanding. [facility] [address]

## 2011-11-25 ENCOUNTER — Encounter: Payer: Self-pay | Admitting: Internal Medicine

## 2011-11-25 ENCOUNTER — Ambulatory Visit (INDEPENDENT_AMBULATORY_CARE_PROVIDER_SITE_OTHER): Payer: Commercial Indemnity | Admitting: Internal Medicine

## 2011-11-25 DIAGNOSIS — I10 Essential (primary) hypertension: Secondary | ICD-10-CM

## 2011-11-25 DIAGNOSIS — IMO0002 Reserved for concepts with insufficient information to code with codable children: Secondary | ICD-10-CM

## 2011-11-25 MED ORDER — TRAMADOL HCL 50 MG PO TABS: ORAL_TABLET | ORAL | Status: AC

## 2011-11-25 NOTE — Patient Instructions (Signed)
Tramadol can be up to every 4 hours to see if it works as well vicodin to reduce your need for vicodin  Call Dr Dutch Quint to let him know the pain is shooting down your leg again  See calendar for specific medication instructions and bring it back for each and every office visit for every healthcare provider you see.  Without it,  you may not receive the best quality medical care that we feel you deserve.  You will note that the calendar groups together  your maintenance  medications that are timed at particular times of the day.  Think of this as your checklist for what your doctor has instructed you to do until your next evaluation to see what benefit  there is  to staying on a consistent group of medications intended to keep you well.  The other group at the bottom is entirely up to you to use as you see fit  for specific symptoms that may arise between visits that require you to treat them on an as needed basis.  Think of this as your action plan or "what if" list.   Separating the top medications from the bottom group is fundamental to providing you adequate care going forward.    Please schedule a follow up office visit in 6 weeks, call sooner if needed

## 2011-11-25 NOTE — Progress Notes (Signed)
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  3/09 back surgery Dr Dutch Quint pain in back and legs barely tolerable, full disability.   September 15, 2009-- new med calendar - Pt o chest pain and "bubbles in stomach". She states that chest pain is sharp and dull and worsens after eating and at bedtime. She states that she feels like "bubbles in chest". still having occasional low grade temps up to 101.2. States that she has had intermittent n/v/d but non in the past week. c/o increased fluid retention more in the evening. lab , cxr and [place] reviewed from last visit. which were essentially unremarkable. rx zegerid, resolved.   January 25, 2010--Returns for 1 month follow up and labs. Last visit complained of fluid retention, actos stopped d/t fluid retention, started on metformin, sugars better. --avg fasting bs 120-164. dx with uti e coli rx cipro and f/u gyn u/a reported to be normal.   Cholecystectomy Sep 2012  05/30/2011  Ov/Wert cc no more than two vicodin per day,  Diarrhea on amaryl and wants off.  Sugars very high only while on steroid shots but no longer taking them. No overt hypo or hyperglycemic symptoms. Congratulations on your smoking cessation efforts See calendar for specific medication instructions and bring it back for each and every office visit for every healthcare provider you see.     Stop amaryl and see if diarrhea resolves> resolved with sugars in 120 range fasting    08/01/2011 f/u ov/Wert diarrhea resolved off amaryl and sugars ok on present rx,  cc understood she needed to take 6 percocet per day or be discharged from pain clinic by [facility] clinic and refused to go back and  requests refills through this clinic.  Did not bring calendar. Only needs 2-3 vicodin per day. Smoking 2-3 per week with stress.  09/09/2011 NP ov Patient returns for followup. She says, overall she's been doing well. She remains on metformin twice daily. Average blood sugars at home have been averaging between 140 and 150. Chronic back pain has been controlled with Vicodin on average 1-2 daily. She uses an occasional Valium on average about one to 2 a month for back spasm. She is cutting down on smoking and on average about 2-3 cigarettes a day. rec No change rx  11/25/2011 f/u ov/Wert  Did not bring med calendar cc  c/o back pain worse x several wks- lower back pain that radiates down her right leg, making it diff to press the gas pedal in her car.  avg 0-2 percocet 5-500 pain shoots to foot when coughing.  This is similar to previous episodes, no clear injury, can't take nsaids due to gi concerns.  No numbness or weakness. No fever nausea or vomitin. Also denies any obvious fluctuation of symptoms with weather or environmental changes or other aggravating or alleviating factors except as outlined above    ROS  At present neg for  any significant sore throat, dysphagia, itching, sneezing,  nasal congestion or excess/ purulent secretions,  fever, chills, sweats, unintended wt loss, pleuritic or exertional cp, hempoptysis, orthopnea pnd or leg swelling.  Also denies presyncope, palpitations, heartburn, abdominal pain, nausea, vomiting, diarrhea  or change in bowel or  urinary habits, dysuria,hematuria,  rash, arthralgias, visual complaints, headache, numbness weakness or ataxia.      Allergies 1) ! Penicillin  2) ! Sulfa  3) ! Erythromycin  4) ! Floxin  5) ! * Latex    Past Medical History:  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)  GERD (ICD-530.81)  HYPERTENSION (ICD-401.9)  DEGENERATIVE DISC DISEASE (ICD-722.6)  Back surgery 07/02/2004:   L5, S1 decompression and fusion with   instrumentation. DIABETES MELLITUS (ICD-250.00  HYPERLIPIDEMIA (ICD-272.4)  - Target LDL < 70 due to DM SYNCOPE (ICD-780.2)  OBESITY (ICD-278.00)  -peak all-time weight 242 December 22, 2009  - target to get BMI < 30 = 179  - referral to nutrition 05/19/2008 > refer again December 22, 2009  Depression  Anxiety  Right Upper quadrant pain  - onset 08/31/2009  - ABD u/s September 08, 2009 > neg  - Cholecystectomy 07/2011 > resolved Health Maintenance.......................................................Marland KitchenWert  - [facility] - 05/30/2011 CPX -Pneumovax 05/2011  -complex med regimen >med calendar 09/09/2011                   Objective:   Physical Exam  amb deeply tanned wf   Wt 218 05/30/2011 > 216  08/01/2011 >>219 09/09/2011 > 11/25/2011  219  HEENT: nl dentition, turbinates, and orophanx. Nl external ear canals without cough reflex   NECK :  without JVD/Nodes/TM/ nl carotid upstrokes bilaterally   LUNGS: no acc muscle use, clear to A and P bilaterally without cough on insp or exp maneuvers   CV:  RRR  no s3 or murmur or increase in P2, no edema   ABD:  soft and nontender with nl excursion in the supine position. No bruits or organomegaly, bowel sounds nl  MS:  warm without deformities, calf tenderness, cyanosis or clubbing        Assessment:         Plan:

## 2011-11-27 NOTE — Assessment & Plan Note (Signed)
Reviewed notes from Dr Dutch Quint and the pain clinic to verify she was not given specific written instructions on how to use narcotics by the pain clinic, and has not been abusive since I assumed the care of the problem with a limit of no more than two vicodin daily.  Now that she has a new pattern of pain that suggests radicular features and can't take nsaids rec trial of tramadol and refer back to Dr Dutch Quint to see what else if anything we can do to help this chronic/ relapsing problem.

## 2011-11-27 NOTE — Assessment & Plan Note (Signed)
Due to pain, marginally Adequate control on present rx, reviewed

## 2011-12-30 ENCOUNTER — Telehealth: Payer: Self-pay | Admitting: Internal Medicine

## 2011-12-30 NOTE — Telephone Encounter (Signed)
Spoke with pt. She is requesting refill for vicodin. States that the tramadol is not doing much good. I advised MW out of the office at this time. She states ok to wait until Monday 01/02/12. Please advise tanks!

## 2012-01-02 MED ORDER — HYDROCODONE-ACETAMINOPHEN 5-500 MG PO TABS: ORAL_TABLET | ORAL | Status: DC

## 2012-01-02 NOTE — Telephone Encounter (Signed)
Our agreement is a max of vicodin twice daily and no more than that so ok to refill with this limit  Remind her the plan was to see Dr Dutch Quint to report her pain is changed from his previous eval

## 2012-01-02 NOTE — Telephone Encounter (Signed)
Pt is aware that Dr. Sherene Sires will fill for twice a day dsoing on Vicodin but she should follow-up with Dr. Dutch Quint for further refills or pain related issues. Pt verbalized understanding and wants RX called to [facility] on [address].

## 2012-01-06 ENCOUNTER — Ambulatory Visit: Payer: Commercial Indemnity | Admitting: Internal Medicine

## 2012-01-06 ENCOUNTER — Ambulatory Visit: Payer: Commercial Indemnity | Admitting: Adult Health

## 2012-01-10 ENCOUNTER — Ambulatory Visit: Payer: Commercial Indemnity | Admitting: Adult Health

## 2012-02-01 ENCOUNTER — Other Ambulatory Visit: Payer: Self-pay

## 2012-02-01 ENCOUNTER — Encounter (HOSPITAL_COMMUNITY): Payer: Self-pay | Admitting: Emergency Medicine

## 2012-02-01 ENCOUNTER — Emergency Department (HOSPITAL_COMMUNITY): Payer: Managed Care, Other (non HMO)

## 2012-02-01 ENCOUNTER — Observation Stay (HOSPITAL_COMMUNITY)
Admission: EM | Admit: 2012-02-01 | Discharge: 2012-02-03 | Disposition: A | Discharge status: Home / Self Care | Payer: Managed Care, Other (non HMO) | Source: Ambulatory Visit | Attending: Internal Medicine | Admitting: Internal Medicine

## 2012-02-01 DIAGNOSIS — E119 Type 2 diabetes mellitus without complications: Secondary | ICD-10-CM | POA: Diagnosis present

## 2012-02-01 DIAGNOSIS — F329 Major depressive disorder, single episode, unspecified: Secondary | ICD-10-CM

## 2012-02-01 DIAGNOSIS — E669 Obesity, unspecified: Secondary | ICD-10-CM

## 2012-02-01 DIAGNOSIS — IMO0002 Reserved for concepts with insufficient information to code with codable children: Secondary | ICD-10-CM | POA: Insufficient documentation

## 2012-02-01 DIAGNOSIS — N3010 Interstitial cystitis (chronic) without hematuria: Secondary | ICD-10-CM

## 2012-02-01 DIAGNOSIS — R079 Chest pain, unspecified: Principal | ICD-10-CM | POA: Diagnosis present

## 2012-02-01 DIAGNOSIS — D72829 Elevated white blood cell count, unspecified: Secondary | ICD-10-CM | POA: Diagnosis present

## 2012-02-01 DIAGNOSIS — E785 Hyperlipidemia, unspecified: Secondary | ICD-10-CM | POA: Diagnosis present

## 2012-02-01 DIAGNOSIS — K2270 Barrett's esophagus without dysplasia: Secondary | ICD-10-CM

## 2012-02-01 DIAGNOSIS — K7689 Other specified diseases of liver: Secondary | ICD-10-CM

## 2012-02-01 DIAGNOSIS — Z720 Tobacco use: Secondary | ICD-10-CM

## 2012-02-01 DIAGNOSIS — Z0000 Encounter for general adult medical examination without abnormal findings: Secondary | ICD-10-CM

## 2012-02-01 DIAGNOSIS — I10 Essential (primary) hypertension: Secondary | ICD-10-CM | POA: Diagnosis present

## 2012-02-01 DIAGNOSIS — F17200 Nicotine dependence, unspecified, uncomplicated: Secondary | ICD-10-CM | POA: Diagnosis present

## 2012-02-01 DIAGNOSIS — R350 Frequency of micturition: Secondary | ICD-10-CM | POA: Insufficient documentation

## 2012-02-01 DIAGNOSIS — R0602 Shortness of breath: Secondary | ICD-10-CM | POA: Insufficient documentation

## 2012-02-01 HISTORY — DX: Pneumonia, unspecified organism: J18.9

## 2012-02-01 HISTORY — DX: Barrett's esophagus without dysplasia: K22.70

## 2012-02-01 LAB — DIFFERENTIAL
Basophils Absolute: 0.1 10*3/uL (ref 0.0–0.1)
Lymphocytes Relative: 39 % (ref 12–46)
Neutro Abs: 7 10*3/uL (ref 1.7–7.7)
Neutrophils Relative %: 51 % (ref 43–77)

## 2012-02-01 LAB — BASIC METABOLIC PANEL
CO2: 28 mEq/L (ref 19–32)
Chloride: 99 mEq/L (ref 96–112)
Potassium: 3.8 mEq/L (ref 3.5–5.1)
Sodium: 140 mEq/L (ref 135–145)

## 2012-02-01 LAB — CBC
HCT: 40.7 % (ref 36.0–46.0)
Platelets: 394 10*3/uL (ref 150–400)
RDW: 12.8 % (ref 11.5–15.5)
WBC: 13.6 10*3/uL — ABNORMAL HIGH (ref 4.0–10.5)

## 2012-02-01 LAB — POCT I-STAT TROPONIN I: Troponin i, poc: 0 ng/mL (ref 0.00–0.08)

## 2012-02-01 MED ORDER — NITROGLYCERIN 0.4 MG SL SUBL
0.4000 mg | SUBLINGUAL_TABLET | SUBLINGUAL | Status: DC | PRN
  Administered 2012-02-02 (×2): 0.4 mg via SUBLINGUAL
  Filled 2012-02-01 (×2): qty 25

## 2012-02-01 MED ORDER — MORPHINE SULFATE 4 MG/ML IJ SOLN
4.0000 mg | Freq: Once | INTRAMUSCULAR | Status: AC
  Administered 2012-02-01: 4 mg via INTRAVENOUS
  Filled 2012-02-01: qty 1

## 2012-02-01 MED ORDER — ONDANSETRON HCL 4 MG/2ML IJ SOLN
4.0000 mg | Freq: Once | INTRAMUSCULAR | Status: AC
  Administered 2012-02-01: 4 mg via INTRAVENOUS
  Filled 2012-02-01: qty 2

## 2012-02-01 NOTE — ED Provider Notes (Signed)
History     CSN: [id number]  Arrival date & time 02/01/12  2237   First MD Initiated Contact with Patient 02/01/12 2321      Chief Complaint  Patient presents with  . Chest Pain    (Consider location/radiation/quality/duration/timing/severity/associated sxs/prior treatment) HPI  Patient who is a non insulin dependent diabetic with hx of HTN, high cholesterol and tobacco abuse presents to ER with complaint of CP. Patient states that at approximately 5:30 this evening she acute onset substernal to left-sided chest pain with radiation of pain up towards her left jaw that she states that lasts for a few minutes and was associated with mild nausea but then resolved however states it was intermittent over a few hour period with a couple episodes of similar pain lasting for a few minutes however patient states that at 10 PM she had recurrence of chest pain with radiation towards her left jaw with associated nausea and pain has not resolved since that onset at 10pm. Patient took aspirin prior to arrival without relief of pain. Patient denies aggravating or alleviating factors. She denies fevers, chills, shortness of breath, recent illness, abdominal pain, vomiting, or diaphoresis. Patient states she has been seen by [facility] cardiology in the past for routine cardiac workup and states she had a stress test approximately 3-4 years ago states remembers it being "normal." Patient denies family history of early heart disease heart attack. She denies personal history of coronary disease or heart attack.  Past Medical History  Diagnosis Date  . Interstitial cystitis   . GERD (gastroesophageal reflux disease)   . Hypertension   . DDD (degenerative disc disease)   . Diabetes mellitus   . Hyperlipidemia   . Obesity   . Depression   . Anxiety     Past Surgical History  Procedure Date  . Neck surgery 12/2007  . Back surgery 12/2007  . Appendectomy   . Total abdominal hysterectomy     Family History    Problem Relation Age of Onset  . Hypertension Father   . Heart disease Mother     ischemic  . Allergies Sister   . Asthma Sister   . Gallbladder disease Mother   . Colon polyps Father   . Other Mother     rectovaginal fistula    History  Substance Use Topics  . Smoking status: Former Smoker -- 0.3 packs/day for 17 years    Types: Cigarettes    Quit date: 11/18/2011  . Smokeless tobacco: Never Used  . Alcohol Use: Yes     rare    OB History    Grav Para Term Preterm Abortions TAB SAB Ect Mult Living                  Review of Systems  All other systems reviewed and are negative.    Allergies  Latex; Penicillins; Erythromycin; Ofloxacin; and Sulfonamide derivatives  Home Medications   Current Outpatient Rx  Name Route Sig Dispense Refill  . AMLODIPINE BESYLATE 5 MG PO TABS Oral Take 5 mg by mouth daily.    . ASPIRIN EC 81 MG PO TBEC Oral Take 81 mg by mouth daily.    Marland Kitchen DICYCLOMINE HCL 20 MG PO TABS Oral Take 20 mg by mouth 2 (two) times daily as needed. For abdominal pain    . ESOMEPRAZOLE MAGNESIUM 40 MG PO CPDR Oral Take 40 mg by mouth daily before breakfast.    . EZETIMIBE-SIMVASTATIN 10-40 MG PO TABS Oral Take 1 tablet  by mouth at bedtime.    . FLUOXETINE HCL 40 MG PO CAPS Oral Take 40 mg by mouth daily.    Marland Kitchen HYDROCHLOROTHIAZIDE 25 MG PO TABS Oral Take 25 mg by mouth daily.    Marland Kitchen HYDROCODONE-ACETAMINOPHEN 5-500 MG PO TABS Oral Take 1 tablet by mouth 2 (two) times daily as needed. Take 1 tablet twice daily as needed    . LORAZEPAM 0.5 MG PO TABS Oral Take 0.5 mg by mouth at bedtime as needed. For insomnia    . METFORMIN HCL ER 500 MG PO TB24 Oral Take 500 mg by mouth 2 (two) times daily.    . MULTIVITAMINS PO TABS Oral Take 1 tablet by mouth daily.      BP 139/84  Pulse 77  Temp(Src) 98.8 F (37.1 C) (Oral)  Resp 18  SpO2 98%  Physical Exam  Nursing note and vitals reviewed. Constitutional: She is oriented to person, place, and time. She appears  well-developed and well-nourished. No distress.  HENT:  Head: Normocephalic and atraumatic.  Eyes: Conjunctivae are normal.  Neck: Normal range of motion. Neck supple.  Cardiovascular: Normal rate, regular rhythm, normal heart sounds and intact distal pulses.  Exam reveals no gallop and no friction rub.   No murmur heard. Pulmonary/Chest: Effort normal and breath sounds normal. No respiratory distress. She has no wheezes. She has no rales. She exhibits no tenderness.  Abdominal: Soft. Bowel sounds are normal. She exhibits no distension and no mass. There is no tenderness. There is no rebound and no guarding.  Musculoskeletal: Normal range of motion. She exhibits no edema and no tenderness.  Neurological: She is alert and oriented to person, place, and time.  Skin: Skin is warm and dry. No rash noted. She is not diaphoretic. No erythema.  Psychiatric: She has a normal mood and affect.    ED Course  Procedures (including critical care time)  IV morphine and zofran, SL nitro.    Date: 02/02/2012  Rate: 74  Rhythm: normal sinus rhythm  QRS Axis: normal  Intervals: normal  ST/T Wave abnormalities: nonspecific ST/T changes  Conduction Disutrbances:none  Narrative Interpretation: non provocative compared to Jul 23, 2010  Old EKG Reviewed: unchanged    Labs Reviewed  CBC - Abnormal; Notable for the following:    WBC 13.6 (*)    All other components within normal limits  DIFFERENTIAL - Abnormal; Notable for the following:    Lymphs Abs 5.2 (*)    Eosinophils Relative 6 (*)    Eosinophils Absolute 0.8 (*)    All other components within normal limits  BASIC METABOLIC PANEL - Abnormal; Notable for the following:    Glucose, Bld 159 (*)    All other components within normal limits  POCT I-STAT TROPONIN I   Dg Chest 2 View  02/01/2012  *RADIOLOGY REPORT*  Clinical Data: Mid chest pain, cough, shortness of breath  CHEST - 2 VIEW  Comparison: 04/28/2011  Findings: Normal heart size and  pulmonary vascularity.  No focal airspace consolidation.  No blunting of costophrenic angles.  No pneumothorax.  Surgical changes in the cervical spine.  Mild degenerative change in the thoracic spine.  No significant change since the previous study.  IMPRESSION: No evidence of active pulmonary disease.  Original Report Authenticated By: Marlon Pel, M.D.   12:11 AM I spoke with the fellow on call for [facility] who states that patient had normal stress test 3-4 years ago and has not followed up with cardiology since then  but agrees that she does have significant risk factors and therefore thinks that she is appropriate for a medicine admission for serial cardiac markers with option for medicine to consult them for consideration of provocative testing in the morning.   1. Chest pain   2. Diabetes mellitus   3. Hypertension   4. Tobacco abuse       MDM  Carlota Raspberry to admit patient to hospital for CP r/o. Will continue to monitor in ER.         Jenness Corner, [place] 02/02/12 [phone]

## 2012-02-01 NOTE — ED Notes (Signed)
PT. REPORTS MID CHEST PAIN WITH SOB AND NAUSEA ONSET THIS AFTERNOON .

## 2012-02-02 ENCOUNTER — Other Ambulatory Visit: Payer: Self-pay

## 2012-02-02 ENCOUNTER — Encounter (HOSPITAL_COMMUNITY): Payer: Self-pay | Admitting: Internal Medicine

## 2012-02-02 DIAGNOSIS — D72829 Elevated white blood cell count, unspecified: Secondary | ICD-10-CM | POA: Diagnosis present

## 2012-02-02 DIAGNOSIS — R079 Chest pain, unspecified: Secondary | ICD-10-CM | POA: Diagnosis present

## 2012-02-02 LAB — GLUCOSE, CAPILLARY
Glucose-Capillary: 109 mg/dL — ABNORMAL HIGH (ref 70–99)
Glucose-Capillary: 163 mg/dL — ABNORMAL HIGH (ref 70–99)

## 2012-02-02 LAB — URINALYSIS, ROUTINE W REFLEX MICROSCOPIC
Nitrite: NEGATIVE
Protein, ur: NEGATIVE mg/dL
Specific Gravity, Urine: 1.006 (ref 1.005–1.030)
Urobilinogen, UA: 0.2 mg/dL (ref 0.0–1.0)

## 2012-02-02 LAB — CBC
HCT: 37.5 % (ref 36.0–46.0)
Hemoglobin: 12.8 g/dL (ref 12.0–15.0)
MCH: 31.1 pg (ref 26.0–34.0)
MCHC: 34.1 g/dL (ref 30.0–36.0)
MCV: 91.2 fL (ref 78.0–100.0)
Platelets: 324 K/uL (ref 150–400)
RBC: 4.11 MIL/uL (ref 3.87–5.11)
RDW: 12.9 % (ref 11.5–15.5)
WBC: 8.1 K/uL (ref 4.0–10.5)

## 2012-02-02 LAB — BASIC METABOLIC PANEL WITH GFR
BUN: 13 mg/dL (ref 6–23)
CO2: 27 meq/L (ref 19–32)
Calcium: 9.3 mg/dL (ref 8.4–10.5)
Chloride: 102 meq/L (ref 96–112)
Creatinine, Ser: 0.57 mg/dL (ref 0.50–1.10)
GFR calc Af Amer: >90 mL/min
GFR calc non Af Amer: >90 mL/min
Glucose, Bld: 198 mg/dL — ABNORMAL HIGH (ref 70–99)
Potassium: 3.8 meq/L (ref 3.5–5.1)
Sodium: 140 meq/L (ref 135–145)

## 2012-02-02 LAB — CARDIAC PANEL(CRET KIN+CKTOT+MB+TROPI)
CK, MB: 1.8 ng/mL (ref 0.3–4.0)
CK, MB: 1.5 ng/mL (ref 0.3–4.0)
CK, MB: 1.7 ng/mL (ref 0.3–4.0)
Relative Index: INVALID (ref 0.0–2.5)
Relative Index: INVALID (ref 0.0–2.5)
Total CK: 63 U/L (ref 7–177)
Total CK: 55 U/L (ref 7–177)

## 2012-02-02 LAB — MRSA PCR SCREENING: MRSA by PCR: NEGATIVE

## 2012-02-02 MED ORDER — FLUOXETINE HCL 40 MG PO CAPS: 40.0000 mg | ORAL_CAPSULE | Freq: Every day | ORAL | Status: DC

## 2012-02-02 MED ORDER — ASPIRIN EC 325 MG PO TBEC
325.0000 mg | DELAYED_RELEASE_TABLET | Freq: Every day | ORAL | Status: DC
  Administered 2012-02-02: 325 mg via ORAL
  Filled 2012-02-02: qty 1

## 2012-02-02 MED ORDER — ONDANSETRON HCL 4 MG PO TABS: 4.0000 mg | ORAL_TABLET | Freq: Four times a day (QID) | ORAL | Status: DC | PRN

## 2012-02-02 MED ORDER — ASPIRIN 81 MG PO CHEW
324.0000 mg | CHEWABLE_TABLET | ORAL | Status: AC
  Administered 2012-02-03: 324 mg via ORAL
  Filled 2012-02-02: qty 4

## 2012-02-02 MED ORDER — EZETIMIBE 10 MG PO TABS
10.0000 mg | ORAL_TABLET | Freq: Every day | ORAL | Status: DC
  Administered 2012-02-02: 10 mg via ORAL
  Filled 2012-02-02 (×2): qty 1

## 2012-02-02 MED ORDER — DIAZEPAM 5 MG PO TABS
5.0000 mg | ORAL_TABLET | Freq: Two times a day (BID) | ORAL | Status: DC | PRN
  Administered 2012-02-03: 5 mg via ORAL
  Filled 2012-02-02: qty 1

## 2012-02-02 MED ORDER — SODIUM CHLORIDE 0.9 % IJ SOLN
3.0000 mL | Freq: Two times a day (BID) | INTRAMUSCULAR | Status: DC
  Administered 2012-02-02 (×2): 3 mL via INTRAVENOUS

## 2012-02-02 MED ORDER — ACETAMINOPHEN 325 MG PO TABS
650.0000 mg | ORAL_TABLET | Freq: Four times a day (QID) | ORAL | Status: DC | PRN
  Administered 2012-02-02 (×2): 650 mg via ORAL
  Filled 2012-02-02 (×2): qty 2

## 2012-02-02 MED ORDER — HYDROCHLOROTHIAZIDE 25 MG PO TABS
25.0000 mg | ORAL_TABLET | Freq: Every day | ORAL | Status: DC
  Administered 2012-02-02: 25 mg via ORAL
  Filled 2012-02-02 (×2): qty 1

## 2012-02-02 MED ORDER — TETANUS-DIPHTH-ACELL PERTUSSIS 5-2.5-18.5 LF-MCG/0.5 IM SUSP
0.5000 mL | Freq: Once | INTRAMUSCULAR | Status: DC
  Filled 2012-02-02: qty 0.5

## 2012-02-02 MED ORDER — DIAZEPAM 5 MG PO TABS
5.0000 mg | ORAL_TABLET | ORAL | Status: AC
  Administered 2012-02-03: 5 mg via ORAL
  Filled 2012-02-02: qty 1

## 2012-02-02 MED ORDER — SODIUM CHLORIDE 0.9 % IV SOLN: 250.0000 mL | INTRAVENOUS | Status: DC | PRN

## 2012-02-02 MED ORDER — ACETAMINOPHEN 325 MG PO TABS
650.0000 mg | ORAL_TABLET | Freq: Once | ORAL | Status: AC
  Administered 2012-02-02: 650 mg via ORAL
  Filled 2012-02-02: qty 2

## 2012-02-02 MED ORDER — ACETAMINOPHEN 650 MG RE SUPP: 650.0000 mg | Freq: Four times a day (QID) | RECTAL | Status: DC | PRN

## 2012-02-02 MED ORDER — AMLODIPINE BESYLATE 5 MG PO TABS
5.0000 mg | ORAL_TABLET | Freq: Every day | ORAL | Status: DC
  Administered 2012-02-02: 5 mg via ORAL
  Filled 2012-02-02 (×2): qty 1

## 2012-02-02 MED ORDER — SENNA 8.6 MG PO TABS
1.0000 | ORAL_TABLET | Freq: Two times a day (BID) | ORAL | Status: DC
  Administered 2012-02-02: 8.6 mg via ORAL
  Filled 2012-02-02 (×4): qty 1

## 2012-02-02 MED ORDER — ATORVASTATIN CALCIUM 20 MG PO TABS
20.0000 mg | ORAL_TABLET | Freq: Every day | ORAL | Status: DC
  Administered 2012-02-02: 20 mg via ORAL
  Filled 2012-02-02 (×2): qty 1

## 2012-02-02 MED ORDER — EZETIMIBE-SIMVASTATIN 10-40 MG PO TABS
1.0000 | ORAL_TABLET | Freq: Every day | ORAL | Status: DC
  Filled 2012-02-02: qty 1

## 2012-02-02 MED ORDER — HEPARIN SODIUM (PORCINE) 5000 UNIT/ML IJ SOLN
5000.0000 [IU] | Freq: Three times a day (TID) | INTRAMUSCULAR | Status: DC
  Administered 2012-02-02 – 2012-02-03 (×4): 5000 [IU] via SUBCUTANEOUS
  Filled 2012-02-02 (×7): qty 1

## 2012-02-02 MED ORDER — SODIUM CHLORIDE 0.9 % IV SOLN
1.0000 mL/kg/h | INTRAVENOUS | Status: DC
  Administered 2012-02-03: 1 mL/kg/h via INTRAVENOUS

## 2012-02-02 MED ORDER — HYDROCODONE-ACETAMINOPHEN 5-325 MG PO TABS
2.0000 | ORAL_TABLET | ORAL | Status: DC | PRN
  Administered 2012-02-02 – 2012-02-03 (×2): 2 via ORAL
  Filled 2012-02-02 (×2): qty 2

## 2012-02-02 MED ORDER — SODIUM CHLORIDE 0.9 % IJ SOLN: 3.0000 mL | INTRAMUSCULAR | Status: DC | PRN

## 2012-02-02 MED ORDER — FLUOXETINE HCL 20 MG PO CAPS
40.0000 mg | ORAL_CAPSULE | Freq: Every day | ORAL | Status: DC
  Administered 2012-02-02: 40 mg via ORAL
  Filled 2012-02-02 (×2): qty 2

## 2012-02-02 MED ORDER — INSULIN ASPART 100 UNIT/ML ~~LOC~~ SOLN
0.0000 [IU] | Freq: Three times a day (TID) | SUBCUTANEOUS | Status: DC
  Administered 2012-02-02 (×2): 2 [IU] via SUBCUTANEOUS

## 2012-02-02 MED ORDER — MORPHINE SULFATE 2 MG/ML IJ SOLN: 1.0000 mg | INTRAMUSCULAR | Status: DC | PRN

## 2012-02-02 MED ORDER — ONDANSETRON HCL 4 MG/2ML IJ SOLN: 4.0000 mg | Freq: Four times a day (QID) | INTRAMUSCULAR | Status: DC | PRN

## 2012-02-02 MED ORDER — DOCUSATE SODIUM 100 MG PO CAPS
100.0000 mg | ORAL_CAPSULE | Freq: Two times a day (BID) | ORAL | Status: DC
  Administered 2012-02-02: 100 mg via ORAL
  Filled 2012-02-02 (×4): qty 1

## 2012-02-02 NOTE — Progress Notes (Signed)
Subjective:  No further chest pain   Physical Exam: Blood pressure 127/62, pulse 57, temperature 97.5 F (36.4 C), temperature source Oral, resp. rate 18, height 5\' 6"  (1.676 m), weight 104.463 kg (230 lb 4.8 oz), SpO2 96.00%. Patient Vitals for the past 24 hrs:  BP Temp Temp src Pulse Resp SpO2 Height Weight  02/02/12 0500 127/62 mmHg 97.5 F (36.4 C) Oral 57  18  96 % - -  02/02/12 0215 144/84 mmHg 97.8 F (36.6 C) Oral 66  18  97 % 5\' 6"  (1.676 m) 104.463 kg (230 lb 4.8 oz)  02/02/12 0020 133/81 mmHg 97.8 F (36.6 C) Oral 66  14  100 % - -  02/01/12 2249 139/84 mmHg 98.8 F (37.1 C) Oral 77  18  98 % - -    Alert and oriented x3 Cvs: rrr Rs; ctab    Investigations:  Recent Results (from the past 240 hour(s))  MRSA PCR SCREENING     Status: Normal   Collection Time   02/02/12  2:41 AM      Component Value Range Status Comment   MRSA by PCR NEGATIVE  NEGATIVE  Final      Basic Metabolic Panel:  [facility] 02/01/12 2253  NA 140  K 3.8  CL 99  CO2 28  GLUCOSE 159*  BUN 14  CREATININE 0.77  CALCIUM 10.1  MG --  PHOS --     CBC:  Basename 02/01/12 2253  WBC 13.6*  NEUTROABS 7.0  HGB 14.4  HCT 40.7  MCV 91.5  PLT 394    Dg Chest 2 View  02/01/2012  *RADIOLOGY REPORT*  Clinical Data: Mid chest pain, cough, shortness of breath  CHEST - 2 VIEW  Comparison: 04/28/2011  Findings: Normal heart size and pulmonary vascularity.  No focal airspace consolidation.  No blunting of costophrenic angles.  No pneumothorax.  Surgical changes in the cervical spine.  Mild degenerative change in the thoracic spine.  No significant change since the previous study.  IMPRESSION: No evidence of active pulmonary disease.  Original Report Authenticated By: Marlon Pel, M.D.      Medications:  Scheduled:   . acetaminophen  650 mg Oral Once  . amLODipine  5 mg Oral Daily  . aspirin EC  325 mg Oral Daily  . docusate sodium  100 mg Oral BID  . ezetimibe-simvastatin  1 tablet  Oral QHS  . FLUoxetine  40 mg Oral Daily  . heparin  5,000 Units Subcutaneous Q8H  . hydrochlorothiazide  25 mg Oral Daily  .  morphine injection  4 mg Intravenous Once  . ondansetron  4 mg Intravenous Once  . senna  1 tablet Oral BID  . sodium chloride  3 mL Intravenous Q12H  . TDaP  0.5 mL Intramuscular Once  . DISCONTD: FLUoxetine  40 mg Oral Daily   Continuous:  YNW:[id number], acetaminophen, morphine, nitroGLYCERIN, ondansetron (ZOFRAN) IV, ondansetron  Impression:  Principal Problem:  *Chest pain Active Problems:  DIABETES MELLITUS  HYPERLIPIDEMIA  SMOKER  HYPERTENSION  Leukocytosis     Plan:   Await cath results Continue home meds    LOS: 1 day   Genessa Beman, MD Pager: [phone] 02/02/2012, 7:21 AM

## 2012-02-02 NOTE — ED Notes (Signed)
Pt stated that she does not want Nitroglycerin right now. Will wait to see if Morphine is effective. Will continue to monitor.

## 2012-02-02 NOTE — ED Notes (Signed)
Report given to [facility]. RN, EDP, and Charge RN aware of pts active CP. Medication given. Charge RN stated to still take pt to CDU.

## 2012-02-02 NOTE — Progress Notes (Addendum)
At 1335 Pt complained of chest pain 5 out of 10 ekg obtain BP=125/79 HR=58 AT 1347 1 NITRO GIVEN AT 1352 PT PAIN WAS 1 OUT 74F 10 AND SHE REFUSED ANOTHER NITRO BP=123/79 hr=68

## 2012-02-02 NOTE — Consult Note (Signed)
Reason for Consult: chest pain  Referring Physician: Dr. Lavera Guise  [facility] SAANVIKA VAZQUES is an 51 y.o. female.    Chief Complaint: chest pain   HPI: 50yoF with h/o obesity, HTN/DM/HL, current 1 ppd smoker, severe DJD s/p multiple back surgeries,  and no prior cardiac history presents with substernal chest pain and leukocytosis.  Pt is reliable historian, states she has never had any chest pain or other cardiac issues in the  past, although did have reportedly negative stress test 4 yrs ago in preparation for a surgery  (cannot find in EPIC), and has good exercise tolerance without dyspnea or angina with exertion.  Earlier tonight in the evening, while sitting on the couch and playing a game on her phone, she  developed sudden onset substernal "squeezing" sensation that radiated into her left jaw. It went  away after unclear amount of time, but through the rest of the evening seems to stutter along,  coming and going, until about 10pm when it came on and did not go away, for which she presented.  There is associated shortness of breath and sensation of inability to take a full breath, and some  nausea with it, but no vomiting and no lightheadedness, no other radiation. It was not clearly  exertional, and possibly improved a bit by sitting forward. There is no clear association with  other movements or tenderness to palpation.  Pt was admitted to rule out cardiac.    Past Medical History  Diagnosis Date  . Interstitial cystitis   . GERD (gastroesophageal reflux disease)   . Hypertension   . DDD (degenerative disc disease)     s/p back surgeries and steroid injxns  . Diabetes mellitus   . Hyperlipidemia   . Obesity   . Depression   . Anxiety   . Barrett esophagus     Dr. Juanda Chance in GI  . Pneumonia   . Kidney stones     Past Surgical History  Procedure Date  . Neck surgery 12/2007  . Back surgery 12/2007  . Appendectomy   . Total abdominal hysterectomy   .  Cholecystectomy     Family History  Problem Relation Age of Onset  . Hypertension Father   . Heart disease Mother     ischemic  . Allergies Sister   . Asthma Sister   . Gallbladder disease Mother   . Colon polyps Father   . Other Mother     rectovaginal fistula   Social History:  reports that she quit smoking about 2 months ago. Her smoking use included Cigarettes. She has a 5.1 pack-year smoking history. She has never used smokeless tobacco. She reports that she drinks alcohol. She reports that she does not use illicit drugs.  Allergies:  Allergies  Allergen Reactions  . Latex Anaphylaxis  . Penicillins Anaphylaxis  . Erythromycin Hives and Itching  . Ofloxacin Hives and Itching  . Sulfonamide Derivatives Hives and Itching    Medications Prior to Admission  Medication Dose Route Frequency Provider Last Rate Last Dose  . acetaminophen (TYLENOL) tablet 650 mg  650 mg Oral Q6H PRN Devonne Doughty, MD       Or  . acetaminophen (TYLENOL) suppository 650 mg  650 mg Rectal Q6H PRN Devonne Doughty, MD      . acetaminophen (TYLENOL) tablet 650 mg  650 mg Oral Once Olivia Mackie, MD   650 mg at 02/02/12 0058  . amLODipine (NORVASC) tablet 5 mg  5 mg Oral Daily Devonne Doughty, MD      . aspirin EC tablet 325 mg  325 mg Oral Daily Devonne Doughty, MD      . docusate sodium (COLACE) capsule 100 mg  100 mg Oral BID Devonne Doughty, MD      . ezetimibe-simvastatin (VYTORIN) 10-40 MG per tablet 1 tablet  1 tablet Oral QHS Devonne Doughty, MD      . FLUoxetine (PROZAC) capsule 40 mg  40 mg Oral Daily Devonne Doughty, MD      . heparin injection 5,000 Units  5,000 Units Subcutaneous Q8H Devonne Doughty, MD   5,000 Units at 02/02/12 0630  . hydrochlorothiazide (HYDRODIURIL) tablet 25 mg  25 mg Oral Daily Devonne Doughty, MD      . morphine 2 MG/ML injection 1 mg  1 mg Intravenous Q4H PRN Devonne Doughty, MD      . morphine 4 MG/ML injection 4 mg  4 mg Intravenous Once Jenness [place], PA   4 mg at 02/01/12 2358  .  nitroGLYCERIN (NITROSTAT) SL tablet 0.4 mg  0.4 mg Sublingual Q5 min PRN Jenness [place], PA   0.4 mg at 02/02/12 0048  . ondansetron (ZOFRAN) injection 4 mg  4 mg Intravenous Once [employer], PA   4 mg at 02/01/12 2358  . ondansetron (ZOFRAN) tablet 4 mg  4 mg Oral Q6H PRN Devonne Doughty, MD       Or  . ondansetron (ZOFRAN) injection 4 mg  4 mg Intravenous Q6H PRN Devonne Doughty, MD      . senna (SENOKOT) tablet 8.6 mg  1 tablet Oral BID Devonne Doughty, MD      . sodium chloride 0.9 % injection 3 mL  3 mL Intravenous Q12H Devonne Doughty, MD      . TDaP (BOOSTRIX) injection 0.5 mL  0.5 mL Intramuscular Once Devonne Doughty, MD      . DISCONTD: FLUoxetine (PROZAC) capsule 40 mg  40 mg Oral Daily Devonne Doughty, MD       Medications Prior to Admission  Medication Sig Dispense Refill  . amLODipine (NORVASC) 5 MG tablet Take 5 mg by mouth daily.      Marland Kitchen aspirin EC 81 MG tablet Take 81 mg by mouth daily.      Marland Kitchen dicyclomine (BENTYL) 20 MG tablet Take 20 mg by mouth 2 (two) times daily as needed. For abdominal pain      . ezetimibe-simvastatin (VYTORIN) 10-40 MG per tablet Take 1 tablet by mouth at bedtime.      Marland Kitchen FLUoxetine (PROZAC) 40 MG capsule Take 40 mg by mouth daily.      . hydrochlorothiazide (HYDRODIURIL) 25 MG tablet Take 25 mg by mouth daily.      Marland Kitchen HYDROcodone-acetaminophen (VICODIN) 5-500 MG per tablet Take 1 tablet by mouth 2 (two) times daily as needed. Take 1 tablet twice daily as needed      . metFORMIN (GLUCOPHAGE-XR) 500 MG 24 hr tablet Take 500 mg by mouth 2 (two) times daily.      . multivitamin (THERAGRAN) per tablet Take 1 tablet by mouth daily.        Results for orders placed during the hospital encounter of 02/01/12 (from the past 48 hour(s))  CBC     Status: Abnormal   Collection Time   02/01/12 10:53 PM      Component Value Range Comment   WBC  13.6 (*) 4.0 - 10.5 (K/uL)    RBC 4.45  3.87 - 5.11 (MIL/uL)    Hemoglobin 14.4  12.0 - 15.0 (g/dL)    HCT 16.1  09.6 - 04.5 (%)    MCV  91.5  78.0 - 100.0 (fL)    MCH 32.4  26.0 - 34.0 (pg)    MCHC 35.4  30.0 - 36.0 (g/dL)    RDW 40.9  81.1 - 91.4 (%)    Platelets 394  150 - 400 (K/uL)   DIFFERENTIAL     Status: Abnormal   Collection Time   02/01/12 10:53 PM      Component Value Range Comment   Neutrophils Relative 51  43 - 77 (%)    Neutro Abs 7.0  1.7 - 7.7 (K/uL)    Lymphocytes Relative 39  12 - 46 (%)    Lymphs Abs 5.2 (*) 0.7 - 4.0 (K/uL)    Monocytes Relative 4  3 - 12 (%)    Monocytes Absolute 0.5  0.1 - 1.0 (K/uL)    Eosinophils Relative 6 (*) 0 - 5 (%)    Eosinophils Absolute 0.8 (*) 0.0 - 0.7 (K/uL)    Basophils Relative 0  0 - 1 (%)    Basophils Absolute 0.1  0.0 - 0.1 (K/uL)   BASIC METABOLIC PANEL     Status: Abnormal   Collection Time   02/01/12 10:53 PM      Component Value Range Comment   Sodium 140  135 - 145 (mEq/L)    Potassium 3.8  3.5 - 5.1 (mEq/L)    Chloride 99  96 - 112 (mEq/L)    CO2 28  19 - 32 (mEq/L)    Glucose, Bld 159 (*) 70 - 99 (mg/dL)    BUN 14  6 - 23 (mg/dL)    Creatinine, Ser 7.82  0.50 - 1.10 (mg/dL)    Calcium 95.6  8.4 - 10.5 (mg/dL)    GFR calc non Af Amer >90  >90 (mL/min)    GFR calc Af Amer >90  >90 (mL/min)   POCT I-STAT TROPONIN I     Status: Normal   Collection Time   02/01/12 11:10 PM      Component Value Range Comment   Troponin i, poc 0.00  0.00 - 0.08 (ng/mL)    Comment 3            URINALYSIS, ROUTINE W REFLEX MICROSCOPIC     Status: Normal   Collection Time   02/02/12  1:36 AM      Component Value Range Comment   Color, Urine YELLOW  YELLOW     APPearance CLEAR  CLEAR     Specific Gravity, Urine 1.006  1.005 - 1.030     pH 6.5  5.0 - 8.0     Glucose, UA NEGATIVE  NEGATIVE (mg/dL)    Hgb urine dipstick NEGATIVE  NEGATIVE     Bilirubin Urine NEGATIVE  NEGATIVE     Ketones, ur NEGATIVE  NEGATIVE (mg/dL)    Protein, ur NEGATIVE  NEGATIVE (mg/dL)    Urobilinogen, UA 0.2  0.0 - 1.0 (mg/dL)    Nitrite NEGATIVE  NEGATIVE     Leukocytes, UA NEGATIVE  NEGATIVE   MICROSCOPIC NOT DONE ON URINES WITH NEGATIVE PROTEIN, BLOOD, LEUKOCYTES, NITRITE, OR GLUCOSE <1000 mg/dL.  CARDIAC PANEL(CRET KIN+CKTOT+MB+TROPI)     Status: Normal   Collection Time   02/02/12  1:52 AM      Component Value Range Comment  Total CK 63  7 - 177 (U/L)    CK, MB 1.8  0.3 - 4.0 (ng/mL)    Troponin I <0.30  <0.30 (ng/mL)    Relative Index RELATIVE INDEX IS INVALID  0.0 - 2.5    MRSA PCR SCREENING     Status: Normal   Collection Time   02/02/12  2:41 AM      Component Value Range Comment   MRSA by PCR NEGATIVE  NEGATIVE    GLUCOSE, CAPILLARY     Status: Abnormal   Collection Time   02/02/12  7:40 AM      Component Value Range Comment   Glucose-Capillary 109 (*) 70 - 99 (mg/dL)    Comment 1 Notify RN      Dg Chest 2 View  02/01/2012  *RADIOLOGY REPORT*  Clinical Data: Mid chest pain, cough, shortness of breath  CHEST - 2 VIEW  Comparison: 04/28/2011  Findings: Normal heart size and pulmonary vascularity.  No focal airspace consolidation.  No blunting of costophrenic angles.  No pneumothorax.  Surgical changes in the cervical spine.  Mild degenerative change in the thoracic spine.  No significant change since the previous study.  IMPRESSION: No evidence of active pulmonary disease.  Original Report Authenticated By: Marlon Pel, M.D.    ROS: General:chest pain, no colds or fevers Skin:no rashes HEENT:no blurred vision CV:see HPI PUL:+ tobacco GI:Hx Barretts esophagus GU:No hematuria ZO:XWRUEAVW back surgeries Neuro:no syncope Endo:+ DM   Blood pressure 127/62, pulse 57, temperature 97.5 F (36.4 C), temperature source Oral, resp. rate 18, height 5\' 6"  (1.676 m), weight 104.463 kg (230 lb 4.8 oz), SpO2 96.00%. PE: General:A&O X 3, pleasant affect, no acute distress Skin:warm and dry, brisk capillary refill HEENT:normocephalic, sclera clear Neck:supple, no JVD Heart:[id number] Lungs:clear without rales, rhonchi or wheezes Abd:+ BS soft, non tender Ext:no  edema Neuro:alert and oriented X 3    Assessment/Plan Patient Active Problem List  Diagnoses  . DIABETES MELLITUS  . HYPERLIPIDEMIA  . OBESITY  . SMOKER  . DEPRESSION  . HYPERTENSION  . FATTY LIVER DISEASE  . INTERSTITIAL CYSTITIS  . DEGENERATIVE DISC DISEASE  . Barrett's esophagus  . Chest pain  . Leukocytosis   PLAN:Per Dr. Karmen Stabs R 02/02/2012, 8:42 AM    Agree with note written by Nada Boozer RNP  Pt with + CRF, new onset CP which was nitrate responsive worrisome for [place]. Exam benign. Enz neg. EKGs w/o acute changes. Needs cor angio. Will schedule for tomorrow. Risks and benefits explained to pt and husband.   Runell Gess 02/02/2012 1:35 PM

## 2012-02-02 NOTE — H&P (Addendum)
PCP:  Sandrea Hughs, MD, MD  This is apparently her PCP. She denies any pulmonary disease though. Dr. Juanda Chance GI  Chief Complaint:  Chest pain  HPI: 50yoF with h/o obesity, HTN/DM/HL, current 1 ppd smoker, severe DJD s/p multiple back surgeries,  and no prior cardiac history presents with substernal chest pain and leukocytosis.   Pt is reliable historian, states she has never had any chest pain or other cardiac issues in the  past, although did have reportedly negative stress test 4 yrs ago in preparation for a surgery  (cannot find in EPIC), and has good exercise tolerance without dyspnea or angina with exertion.  Earlier tonight in the evening, while sitting on the couch and playing a game on her phone, she  developed sudden onset substernal "squeezing" sensation that radiated into her left jaw. It went  away after unclear amount of time, but through the rest of the evening seems to stutter along,  coming and going, until about 10pm when it came on and did not go away, for which she presented.  There is associated shortness of breath and sensation of inability to take a full breath, and some  nausea with it, but no vomiting and no lightheadedness, no other radiation. It was not clearly  exertional, and possibly improved a bit by sitting forward. There is no clear association with  other movements or tenderness to palpation.   In the ED, vitals were stable. Labs with normal chem panel, negative Trop POC, WBC elevated to  13.6 with 6% eosinophilia, rest of CBC normal. CXR was negative. ECG not ischemic. She was given  SL NTG, and is quite clear that the pain improved with after taking it. Cardiology was consulted  and recommended medicine admission and to call them in the am if needed.   Otherwise, ROS with some increased urinary frequency but no clear dysuria. She has felt run down  today but no fevers, chills, sweats, cough. No other GI issues, no abd pain, diarrhea. She was  feeling  well until tonight and otherwise ROS is negative.  Past Medical History  Diagnosis Date  . Interstitial cystitis   . GERD (gastroesophageal reflux disease)   . Hypertension   . DDD (degenerative disc disease)     s/p back surgeries and steroid injxns  . Diabetes mellitus   . Hyperlipidemia   . Obesity   . Depression   . Anxiety   . Barrett esophagus     Dr. Juanda Chance in GI    Past Surgical History  Procedure Date  . Neck surgery 12/2007  . Back surgery 12/2007  . Appendectomy   . Total abdominal hysterectomy   . Cholecystectomy     Medications:  HOME MEDS: Able to name her medications  Prior to Admission medications   Medication Sig Start Date End Date Taking? Authorizing Provider  amLODipine (NORVASC) 5 MG tablet Take 5 mg by mouth daily. 07/14/11  Yes Nyoka Cowden, MD  aspirin EC 81 MG tablet Take 81 mg by mouth daily. 09/12/11 09/11/12 Yes Tammy S Parrett, NP  dicyclomine (BENTYL) 20 MG tablet Take 20 mg by mouth 2 (two) times daily as needed. For abdominal pain 09/12/11  Yes Tammy S Parrett, NP  esomeprazole (NEXIUM) 40 MG capsule Take 40 mg by mouth daily before breakfast.   Yes Historical Provider, MD  ezetimibe-simvastatin (VYTORIN) 10-40 MG per tablet Take 1 tablet by mouth at bedtime. 08/02/11  Yes Nyoka Cowden, MD  FLUoxetine (PROZAC) 40 MG  capsule Take 40 mg by mouth daily. 07/14/11  Yes Nyoka Cowden, MD  hydrochlorothiazide (HYDRODIURIL) 25 MG tablet Take 25 mg by mouth daily. 07/14/11  Yes Nyoka Cowden, MD  HYDROcodone-acetaminophen (VICODIN) 5-500 MG per tablet Take 1 tablet by mouth 2 (two) times daily as needed. Take 1 tablet twice daily as needed 01/02/12 01/01/13 Yes Nyoka Cowden, MD  LORazepam (ATIVAN) 0.5 MG tablet Take 0.5 mg by mouth at bedtime as needed. For insomnia   Yes Historical Provider, MD  metFORMIN (GLUCOPHAGE-XR) 500 MG 24 hr tablet Take 500 mg by mouth 2 (two) times daily. 08/17/11  Yes Nyoka Cowden, MD  multivitamin [facility]) per tablet  Take 1 tablet by mouth daily. 09/12/11 09/11/12 Yes Tammy [employer], NP    Allergies:  Allergies  Allergen Reactions  . Latex Anaphylaxis  . Penicillins Anaphylaxis  . Erythromycin Hives and Itching  . Ofloxacin Hives and Itching  . Sulfonamide Derivatives Hives and Itching    Social History:   reports that she quit smoking about 2 months ago. Her smoking use included Cigarettes. She has a 5.1 pack-year smoking history. She has never used smokeless tobacco. She reports that she drinks alcohol. She reports that she does not use illicit drugs. She lives at home with her husband and daughter. She is still active and ambulate on her own. She is currently smoking up to a pack per day but interested in stopping.   Family History: Family History  Problem Relation Age of Onset  . Hypertension Father   . Heart disease Mother     ischemic  . Allergies Sister   . Asthma Sister   . Gallbladder disease Mother   . Colon polyps Father   . Other Mother     rectovaginal fistula    Physical Exam: Filed Vitals:   02/01/12 2249 02/02/12 0020  BP: 139/84 133/81  Pulse: 77 66  Temp: 98.8 F (37.1 C) 97.8 F (36.6 C)  TempSrc: Oral Oral  Resp: 18 14  SpO2: 98% 100%   Blood pressure 133/81, pulse 66, temperature 97.8 F (36.6 C), temperature source Oral, resp. rate 14, SpO2 100.00%. Gen: Middle aged appearing, overweight but not morbidly obese F in ED stretcher, pleasant, able to  relate history well, appears overall stable and not distresses HEENT: Pupils round, reactive normal appearing, irises, conjunctivae clear and normal. Mouth  moist, normal appearing Lungs: CTAB no w/c/r, good air movement, overall normal exam Heart: Regular, S1/2 clearly heard, no m/g, not tachycardic, no TTP of the chest, no worsened pain  with sitting forward Abd: Soft, non tender, non distended, no facial grimacing, overall normal exam Extrem: Normal exam with good bulk and tone, good perfusion, no BLE edema  noted, radials palpable Neuro: Alert, attentive, conversant, CN 2-12 intact, moves extremities on her own, grossly non- focal    Labs & Imaging Results for orders placed during the hospital encounter of 02/01/12 (from the past 48 hour(s))  CBC     Status: Abnormal   Collection Time   02/01/12 10:53 PM      Component Value Range Comment   WBC 13.6 (*) 4.0 - 10.5 (K/uL)    RBC 4.45  3.87 - 5.11 (MIL/uL)    Hemoglobin 14.4  12.0 - 15.0 (g/dL)    HCT 96.0  45.4 - 09.8 (%)    MCV 91.5  78.0 - 100.0 (fL)    MCH 32.4  26.0 - 34.0 (pg)    MCHC 35.4  30.0 -  36.0 (g/dL)    RDW 54.0  98.1 - 19.1 (%)    Platelets 394  150 - 400 (K/uL)   DIFFERENTIAL     Status: Abnormal   Collection Time   02/01/12 10:53 PM      Component Value Range Comment   Neutrophils Relative 51  43 - 77 (%)    Neutro Abs 7.0  1.7 - 7.7 (K/uL)    Lymphocytes Relative 39  12 - 46 (%)    Lymphs Abs 5.2 (*) 0.7 - 4.0 (K/uL)    Monocytes Relative 4  3 - 12 (%)    Monocytes Absolute 0.5  0.1 - 1.0 (K/uL)    Eosinophils Relative 6 (*) 0 - 5 (%)    Eosinophils Absolute 0.8 (*) 0.0 - 0.7 (K/uL)    Basophils Relative 0  0 - 1 (%)    Basophils Absolute 0.1  0.0 - 0.1 (K/uL)   BASIC METABOLIC PANEL     Status: Abnormal   Collection Time   02/01/12 10:53 PM      Component Value Range Comment   Sodium 140  135 - 145 (mEq/L)    Potassium 3.8  3.5 - 5.1 (mEq/L)    Chloride 99  96 - 112 (mEq/L)    CO2 28  19 - 32 (mEq/L)    Glucose, Bld 159 (*) 70 - 99 (mg/dL)    BUN 14  6 - 23 (mg/dL)    Creatinine, Ser 4.78  0.50 - 1.10 (mg/dL)    Calcium 29.5  8.4 - 10.5 (mg/dL)    GFR calc non Af Amer >90  >90 (mL/min)    GFR calc Af Amer >90  >90 (mL/min)   POCT I-STAT TROPONIN I     Status: Normal   Collection Time   02/01/12 11:10 PM      Component Value Range Comment   Troponin i, poc 0.00  0.00 - 0.08 (ng/mL)    Comment 3             Dg Chest 2 View  02/01/2012  *RADIOLOGY REPORT*  Clinical Data: Mid chest pain, cough, shortness of  breath  CHEST - 2 VIEW  Comparison: 04/28/2011  Findings: Normal heart size and pulmonary vascularity.  No focal airspace consolidation.  No blunting of costophrenic angles.  No pneumothorax.  Surgical changes in the cervical spine.  Mild degenerative change in the thoracic spine.  No significant change since the previous study.  IMPRESSION: No evidence of active pulmonary disease.  Original Report Authenticated By: Marlon Pel, M.D.     ECG: NSR, normal axis, normal P and PR, narrow QRS, no Q waves, normal RWP, no ST deviations,  normal T waves. This is a completely normal ECG.   Impression Present on Admission:  .SMOKER .HYPERTENSION .HYPERLIPIDEMIA .DIABETES MELLITUS .Chest pain .Leukocytosis   50yoF with h/o obesity, HTN/DM/HL, current 1 ppd smoker, severe DJD s/p multiple back surgeries,  and no prior cardiac history presents with substernal chest pain and leukocytosis.   1. Chest pain: She actually has a good story for angina and certainly has the risk factors, and  pain improved with SL NTG. Despite stuttering symptoms for the past 6-7 hours, she has no  objective evidence of ischemia at present. Therefore, we'll admit for a rule out MI and if  negative, would definitely get her set up for stress test either inpatient or outpatient depending  on how she feels. Will not heparinize at present given zero evidence of  ischemia, but low  threshold to start. DDx includes pericarditis given ? positional nature.  - ASA 325, SL NTG and ECG PRN chest pain, trend enzymes/ECG, and stress test if rules out.  - Continue home amlodipine, vytorin, HCTZ  2. Leukocytosis: Not clear what to make of this. CXR clear. Need to get UA. Could be stress  reaction due to #1, or pericarditis. Will hold on ABx and monitor for now.   3. Tobacco abuse: cessation counseling consult  4. Continue home prozac  5. Diabetes: hold metformin, will start SSI  Telemetry, [facility] team 5 Presumed full  code  Other plans as per orders.    Arabelle Bollig 02/02/2012, 1:56 AM

## 2012-02-02 NOTE — Progress Notes (Signed)
Utilization review completed. [name] 02/02/2012 

## 2012-02-02 NOTE — ED Provider Notes (Signed)
Medical screening examination/treatment/procedure(s) were performed by non-physician practitioner and as supervising physician I was immediately available for consultation/collaboration.  Nadirah Socorro M Melody Cirrincione, MD 02/02/12 0344 

## 2012-02-02 NOTE — ED Notes (Signed)
Pt stated that she has been having left side CP starting today. Pain also in left side of neck. Pain is 8 out of 10. Pt states that she has SOB with CP. No n/v, no cough, no radiation. Will continue to monitor.

## 2012-02-03 ENCOUNTER — Encounter (HOSPITAL_COMMUNITY)
Admission: EM | Disposition: A | Discharge status: Home / Self Care | Payer: Self-pay | Source: Ambulatory Visit | Attending: Internal Medicine

## 2012-02-03 HISTORY — PX: LEFT HEART CATHETERIZATION WITH CORONARY ANGIOGRAM: SHX5451

## 2012-02-03 LAB — BASIC METABOLIC PANEL
BUN: 16 mg/dL (ref 6–23)
Chloride: 100 mEq/L (ref 96–112)
Creatinine, Ser: 0.65 mg/dL (ref 0.50–1.10)
GFR calc Af Amer: >90 mL/min (ref >90)
GFR calc non Af Amer: >90 mL/min (ref >90)
Glucose, Bld: 126 mg/dL — ABNORMAL HIGH (ref 70–99)

## 2012-02-03 LAB — CBC
HCT: 39.2 % (ref 36.0–46.0)
MCH: 31.4 pg (ref 26.0–34.0)
MCV: 93.3 fL (ref 78.0–100.0)
Platelets: 337 10*3/uL (ref 150–400)
RBC: 4.2 MIL/uL (ref 3.87–5.11)

## 2012-02-03 LAB — GLUCOSE, CAPILLARY: Glucose-Capillary: 140 mg/dL — ABNORMAL HIGH (ref 70–99)

## 2012-02-03 SURGERY — LEFT HEART CATHETERIZATION WITH CORONARY ANGIOGRAM
Anesthesia: LOCAL

## 2012-02-03 MED ORDER — ONDANSETRON HCL 4 MG/2ML IJ SOLN: 4.0000 mg | Freq: Four times a day (QID) | INTRAMUSCULAR | Status: DC | PRN

## 2012-02-03 MED ORDER — MIDAZOLAM HCL 2 MG/2ML IJ SOLN
INTRAMUSCULAR | Status: AC
  Filled 2012-02-03: qty 2

## 2012-02-03 MED ORDER — ACETAMINOPHEN 325 MG PO TABS: 650.0000 mg | ORAL_TABLET | ORAL | Status: DC | PRN

## 2012-02-03 MED ORDER — HEPARIN (PORCINE) IN NACL 2-0.9 UNIT/ML-% IJ SOLN
INTRAMUSCULAR | Status: AC
  Filled 2012-02-03: qty 2000

## 2012-02-03 MED ORDER — HYDROCODONE-ACETAMINOPHEN 5-325 MG PO TABS
ORAL_TABLET | ORAL | Status: AC
  Filled 2012-02-03: qty 2

## 2012-02-03 MED ORDER — LIDOCAINE HCL (PF) 1 % IJ SOLN
INTRAMUSCULAR | Status: AC
  Filled 2012-02-03: qty 30

## 2012-02-03 MED ORDER — HYDROCODONE-ACETAMINOPHEN 5-325 MG PO TABS
2.0000 | ORAL_TABLET | Freq: Once | ORAL | Status: AC
  Administered 2012-02-03: 2 via ORAL

## 2012-02-03 MED ORDER — FENTANYL CITRATE 0.05 MG/ML IJ SOLN
INTRAMUSCULAR | Status: AC
  Filled 2012-02-03: qty 2

## 2012-02-03 MED ORDER — SODIUM CHLORIDE 0.9 % IV SOLN: INTRAVENOUS | Status: DC

## 2012-02-03 NOTE — Cardiovascular Report (Signed)
[name], [name] NO.:  [id number]  MEDICAL RECORD NO.:  [id number]  LOCATION:  3711                         FACILITY:  [facility]  PHYSICIAN:  Nicki Guadalajara, M.D.     DATE OF BIRTH:  1961/10/08  DATE OF PROCEDURE:  02/03/2012 DATE OF DISCHARGE:                           CARDIAC CATHETERIZATION   INDICATIONS:  Ms. [name] is a 51 year old female who has multiple coronary risk factors, including 25 year history of hypertension, history of tobacco use, obesity, hyperlipidemia, and diabetes.  She was admitted to [facility] with chest pain that was nitrate responsive.  She did not have acute ECG changes.  She was seen by Dr. Allyson Sabal and in light of her risk factors, definitive cardiac catheterization was recommended.  PROCEDURE:  After premedication with Versed 2 mg intravenously plus fentanyl 50 mcg, the patient was prepped and draped in usual fashion. Right femoral artery was punctured anteriorly and a 5-French sheath was inserted.  Catheterization was performed utilizing FL4 and 5-French __________ catheters.  The patient did receive an additional 1 mg of Versed plus 25 mcg of fentanyl.  She had previously had back surgery for discomfort.  A 5-French pigtail catheter was used for left ventriculography.  With the patient's 25 year history of taking antihypertensive medications, distal aortography was also performed to make certain she did not have any renal artery stenosis or significant aortoiliac disease.  Hemostasis was obtained by direct manual pressure. She tolerated the procedure well.  HEMODYNAMIC DATA:  Central aortic pressure is 128/70, left ventricular pressure 128/3.  Post A-wave 8.  ANGIOGRAPHIC DATA:  Left main coronary artery was angiographically normal and with upward takeoff and trifurcated into an LAD, ramus intermediate vessel and left circumflex coronary artery.  The LAD was angiographically normal and wrapped around the LV apex.   The vessel gave rise to 1 major proximal diagonal vessel and several septal perforating arteries.  The intermediate vessel was angiographically normal.  The circumflex vessel was angiographically normal, gave rise to 2 marginal vessels.  The right coronary artery had an upward takeoff and was angiographically normal, gave rise to a small PDA system, and a small PLA system.  RAO ventriculography revealed normal LV function with an ejection fraction of 55% without segmental wall motion abnormalities.  There was no evidence for mitral regurgitation.  Distal aortography revealed widely patent renal arteries.  There was no significant aortoiliac disease.  In her lumbar spine, she had multiple screws and plates secondary to her prior lumbar surgery.  IMPRESSION: 1. Normal left ventricular function. 2. Normal coronary arteries.          ______________________________ Nicki Guadalajara, M.D.     TK/MEDQ  D:  02/03/2012  T:  02/03/2012  Job:  [id number]

## 2012-02-03 NOTE — Progress Notes (Signed)
51 year old female with + CRF, new onset CP which was nitrate responsive worrisome for [place]. Exam benign. Enz neg. EKGs w/o acute changes. Needs cor angio. Will schedule for tomorrow. Risks and benefits explained to pt and husband  Subjective: Recurrent chest pain yesterday pm.  Objective: Vital signs in last 24 hours: Temp:  [98.3 F (36.8 C)-98.4 F (36.9 C)] 98.4 F (36.9 C) (04/05 0700) Pulse Rate:  [56-57] 56  (04/05 0600) Resp:  [16-20] 20  (04/05 0600) BP: (101-131)/(64-76) 131/76 mmHg (04/05 0600) SpO2:  [93 %-98 %] 93 % (04/05 0600) Weight:  [98.4 kg (216 lb 14.9 oz)] 98.4 kg (216 lb 14.9 oz) (04/05 0600) Weight change: -6.063 kg (-13 lb 5.9 oz) Last BM Date: 02/01/12 Intake/Output from previous day: +1318 04/04 0701 - 04/05 0700 In: 1323 [P.O.:1320; I.V.:3] Out: 5 [Urine:5] Intake/Output this shift:    PE: General: No further CP No JVD Heart:RRR no S3 Lungs: no rales Abd: BS+ Ext:no edema    Lab Results:  Basename 02/03/12 0435 02/02/12 0946  WBC 8.4 8.1  HGB 13.2 12.8  HCT 39.2 37.5  PLT 337 324   BMET  Basename 02/03/12 0435 02/02/12 0946  NA 140 140  K 3.8 3.8  CL 100 102  CO2 31 27  GLUCOSE 126* 198*  BUN 16 13  CREATININE 0.65 0.57  CALCIUM 9.6 9.3    Basename 02/02/12 1734 02/02/12 0946  TROPONINI <0.30 <0.30    Lab Results  Component Value Date   CHOL 135 09/09/2011   HDL 45.40 09/09/2011   LDLCALC 63 09/09/2011   LDLDIRECT 148.6 07/23/2007   TRIG 134.0 09/09/2011   CHOLHDL 3 09/09/2011   Lab Results  Component Value Date   HGBA1C 6.9* 09/09/2011     Lab Results  Component Value Date   TSH 1.07 05/30/2011    Hepatic Function Panel No results found for this basename: PROT,ALBUMIN,AST,ALT,ALKPHOS,BILITOT,BILIDIR,IBILI in the last 72 hours No results found for this basename: CHOL in the last 72 hours No results found for this basename: PROTIME in the last 72 hours    EKG: Orders placed during the hospital encounter of 02/01/12    . EKG 12-LEAD  . EKG 12-LEAD  . EKG 12-LEAD  . EKG 12-LEAD    Studies/Results: Dg Chest 2 View  02/01/2012  *RADIOLOGY REPORT*  Clinical Data: Mid chest pain, cough, shortness of breath  CHEST - 2 VIEW  Comparison: 04/28/2011  Findings: Normal heart size and pulmonary vascularity.  No focal airspace consolidation.  No blunting of costophrenic angles.  No pneumothorax.  Surgical changes in the cervical spine.  Mild degenerative change in the thoracic spine.  No significant change since the previous study.  IMPRESSION: No evidence of active pulmonary disease.  Original Report Authenticated By: Marlon Pel, M.D.    Medications: I have reviewed the patient's current medications.    Marland Kitchen amLODipine  5 mg Oral Daily  . aspirin  324 mg Oral Pre-Cath  . aspirin EC  325 mg Oral Daily  . atorvastatin  20 mg Oral q1800  . diazepam  5 mg Oral On Call  . docusate sodium  100 mg Oral BID  . ezetimibe  10 mg Oral q1800  . FLUoxetine  40 mg Oral Daily  . heparin  5,000 Units Subcutaneous Q8H  . hydrochlorothiazide  25 mg Oral Daily  . insulin aspart  0-9 Units Subcutaneous TID WC  . senna  1 tablet Oral BID  . sodium chloride  3  mL Intravenous Q12H  . sodium chloride  3 mL Intravenous Q12H  . TDaP  0.5 mL Intramuscular Once  . DISCONTD: ezetimibe-simvastatin  1 tablet Oral QHS   Assessment/Plan: Patient Active Problem List  Diagnoses  . DIABETES MELLITUS  . HYPERLIPIDEMIA  . OBESITY  . SMOKER  . DEPRESSION  . HYPERTENSION  . FATTY LIVER DISEASE  . INTERSTITIAL CYSTITIS  . DEGENERATIVE DISC DISEASE  . Barrett's esophagus  . Chest pain  . Leukocytosis   Plan: Cardiac cath today.  VS stable, labs stable.  Dr. Tresa Endo to see for exam.  LOS: 2 days   INGOLD,LAURA R 02/03/2012, 8:35 AM   Patient seen and examined. Agree with assessment and plan. Discussed cath procedure with pt and husband. Pt has significant cardiac risk factors and worrisome symptoms.  Plan cath with possible PCI  today.   Lennette Bihari, MD, [facility] 02/03/2012 8:47 AM

## 2012-02-03 NOTE — Discharge Summary (Signed)
Patient ID: [name] MRN: [id number] DOB/AGE: 03/28/1961 51 y.o. Primary Care Physician:Michael Wert, MD, MD Admit date: 02/01/2012 Discharge date: 02/03/2012    Discharge Diagnoses:   Principal Problem:  *Chest pain - cardiac catheterization did not show significant coronary disease Active Problems:  DIABETES MELLITUS  HYPERLIPIDEMIA  SMOKER  HYPERTENSION  Leukocytosis   Medication List  As of 02/03/2012  2:04 PM   CONTINUE taking these medications         amLODipine 5 MG tablet   Commonly known as: NORVASC      aspirin EC 81 MG tablet      dicyclomine 20 MG tablet   Commonly known as: BENTYL      esomeprazole 40 MG capsule   Commonly known as: NEXIUM      ezetimibe-simvastatin 10-40 MG per tablet   Commonly known as: VYTORIN      FLUoxetine 40 MG capsule   Commonly known as: PROZAC      hydrochlorothiazide 25 MG tablet   Commonly known as: HYDRODIURIL      HYDROcodone-acetaminophen 5-500 MG per tablet   Commonly known as: VICODIN      LORazepam 0.5 MG tablet   Commonly known as: ATIVAN      metFORMIN 500 MG 24 hr tablet   Commonly known as: GLUCOPHAGE-XR      multivitamin per tablet         STOP taking these medications         Calcium Carbonate-Vitamin D 600-400 MG-UNIT per tablet      Simethicone 125 MG Caps            Discharged Condition: Good    Consults: [place] cardiology  Significant Diagnostic Studies: Dg Chest 2 View  02/01/2012  *RADIOLOGY REPORT*  Clinical Data: Mid chest pain, cough, shortness of breath  CHEST - 2 VIEW  Comparison: 04/28/2011  Findings: Normal heart size and pulmonary vascularity.  No focal airspace consolidation.  No blunting of costophrenic angles.  No pneumothorax.  Surgical changes in the cervical spine.  Mild degenerative change in the thoracic spine.  No significant change since the previous study.  IMPRESSION: No evidence of active pulmonary disease.  Original Report Authenticated By: Marlon Pel, M.D.    Lab Results: Results for orders placed during the hospital encounter of 02/01/12 (from the past 48 hour(s))  CBC     Status: Abnormal   Collection Time   02/01/12 10:53 PM      Component Value Range Comment   WBC 13.6 (*) 4.0 - 10.5 (K/uL)    RBC 4.45  3.87 - 5.11 (MIL/uL)    Hemoglobin 14.4  12.0 - 15.0 (g/dL)    HCT 08.6  57.8 - 46.9 (%)    MCV 91.5  78.0 - 100.0 (fL)    MCH 32.4  26.0 - 34.0 (pg)    MCHC 35.4  30.0 - 36.0 (g/dL)    RDW 62.9  52.8 - 41.3 (%)    Platelets 394  150 - 400 (K/uL)   DIFFERENTIAL     Status: Abnormal   Collection Time   02/01/12 10:53 PM      Component Value Range Comment   Neutrophils Relative 51  43 - 77 (%)    Neutro Abs 7.0  1.7 - 7.7 (K/uL)    Lymphocytes Relative 39  12 - 46 (%)    Lymphs Abs 5.2 (*) 0.7 - 4.0 (K/uL)    Monocytes Relative 4  3 - 12 (%)  Monocytes Absolute 0.5  0.1 - 1.0 (K/uL)    Eosinophils Relative 6 (*) 0 - 5 (%)    Eosinophils Absolute 0.8 (*) 0.0 - 0.7 (K/uL)    Basophils Relative 0  0 - 1 (%)    Basophils Absolute 0.1  0.0 - 0.1 (K/uL)   BASIC METABOLIC PANEL     Status: Abnormal   Collection Time   02/01/12 10:53 PM      Component Value Range Comment   Sodium 140  135 - 145 (mEq/L)    Potassium 3.8  3.5 - 5.1 (mEq/L)    Chloride 99  96 - 112 (mEq/L)    CO2 28  19 - 32 (mEq/L)    Glucose, Bld 159 (*) 70 - 99 (mg/dL)    BUN 14  6 - 23 (mg/dL)    Creatinine, Ser 9.60  0.50 - 1.10 (mg/dL)    Calcium 45.4  8.4 - 10.5 (mg/dL)    GFR calc non Af Amer >90  >90 (mL/min)    GFR calc Af Amer >90  >90 (mL/min)   POCT I-STAT TROPONIN I     Status: Normal   Collection Time   02/01/12 11:10 PM      Component Value Range Comment   Troponin i, poc 0.00  0.00 - 0.08 (ng/mL)    Comment 3            URINALYSIS, ROUTINE W REFLEX MICROSCOPIC     Status: Normal   Collection Time   02/02/12  1:36 AM      Component Value Range Comment   Color, Urine YELLOW  YELLOW     APPearance CLEAR  CLEAR     Specific Gravity,  Urine 1.006  1.005 - 1.030     pH 6.5  5.0 - 8.0     Glucose, UA NEGATIVE  NEGATIVE (mg/dL)    Hgb urine dipstick NEGATIVE  NEGATIVE     Bilirubin Urine NEGATIVE  NEGATIVE     Ketones, ur NEGATIVE  NEGATIVE (mg/dL)    Protein, ur NEGATIVE  NEGATIVE (mg/dL)    Urobilinogen, UA 0.2  0.0 - 1.0 (mg/dL)    Nitrite NEGATIVE  NEGATIVE     Leukocytes, UA NEGATIVE  NEGATIVE  MICROSCOPIC NOT DONE ON URINES WITH NEGATIVE PROTEIN, BLOOD, LEUKOCYTES, NITRITE, OR GLUCOSE <1000 mg/dL.  CARDIAC PANEL(CRET KIN+CKTOT+MB+TROPI)     Status: Normal   Collection Time   02/02/12  1:52 AM      Component Value Range Comment   Total CK 63  7 - 177 (U/L)    CK, MB 1.8  0.3 - 4.0 (ng/mL)    Troponin I <0.30  <0.30 (ng/mL)    Relative Index RELATIVE INDEX IS INVALID  0.0 - 2.5    MRSA PCR SCREENING     Status: Normal   Collection Time   02/02/12  2:41 AM      Component Value Range Comment   MRSA by PCR NEGATIVE  NEGATIVE    GLUCOSE, CAPILLARY     Status: Abnormal   Collection Time   02/02/12  7:40 AM      Component Value Range Comment   Glucose-Capillary 109 (*) 70 - 99 (mg/dL)    Comment 1 Notify RN     CARDIAC PANEL(CRET KIN+CKTOT+MB+TROPI)     Status: Normal   Collection Time   02/02/12  9:46 AM      Component Value Range Comment   Total CK 60  7 - 177 (U/L)  CK, MB 1.5  0.3 - 4.0 (ng/mL)    Troponin I <0.30  <0.30 (ng/mL)    Relative Index RELATIVE INDEX IS INVALID  0.0 - 2.5    BASIC METABOLIC PANEL     Status: Abnormal   Collection Time   02/02/12  9:46 AM      Component Value Range Comment   Sodium 140  135 - 145 (mEq/L)    Potassium 3.8  3.5 - 5.1 (mEq/L)    Chloride 102  96 - 112 (mEq/L)    CO2 27  19 - 32 (mEq/L)    Glucose, Bld 198 (*) 70 - 99 (mg/dL)    BUN 13  6 - 23 (mg/dL)    Creatinine, Ser 0.98  0.50 - 1.10 (mg/dL)    Calcium 9.3  8.4 - 10.5 (mg/dL)    GFR calc non Af Amer >90  >90 (mL/min)    GFR calc Af Amer >90  >90 (mL/min)   CBC     Status: Normal   Collection Time   02/02/12   9:46 AM      Component Value Range Comment   WBC 8.1  4.0 - 10.5 (K/uL)    RBC 4.11  3.87 - 5.11 (MIL/uL)    Hemoglobin 12.8  12.0 - 15.0 (g/dL)    HCT 11.9  14.7 - 82.9 (%)    MCV 91.2  78.0 - 100.0 (fL)    MCH 31.1  26.0 - 34.0 (pg)    MCHC 34.1  30.0 - 36.0 (g/dL)    RDW 56.2  13.0 - 86.5 (%)    Platelets 324  150 - 400 (K/uL)   GLUCOSE, CAPILLARY     Status: Abnormal   Collection Time   02/02/12 11:26 AM      Component Value Range Comment   Glucose-Capillary 163 (*) 70 - 99 (mg/dL)    Comment 1 Notify RN     GLUCOSE, CAPILLARY     Status: Abnormal   Collection Time   02/02/12  5:02 PM      Component Value Range Comment   Glucose-Capillary 166 (*) 70 - 99 (mg/dL)    Comment 1 Notify RN     CARDIAC PANEL(CRET KIN+CKTOT+MB+TROPI)     Status: Normal   Collection Time   02/02/12  5:34 PM      Component Value Range Comment   Total CK 55  7 - 177 (U/L)    CK, MB 1.7  0.3 - 4.0 (ng/mL)    Troponin I <0.30  <0.30 (ng/mL)    Relative Index RELATIVE INDEX IS INVALID  0.0 - 2.5    GLUCOSE, CAPILLARY     Status: Abnormal   Collection Time   02/02/12  9:40 PM      Component Value Range Comment   Glucose-Capillary 173 (*) 70 - 99 (mg/dL)   BASIC METABOLIC PANEL     Status: Abnormal   Collection Time   02/03/12  4:35 AM      Component Value Range Comment   Sodium 140  135 - 145 (mEq/L)    Potassium 3.8  3.5 - 5.1 (mEq/L)    Chloride 100  96 - 112 (mEq/L)    CO2 31  19 - 32 (mEq/L)    Glucose, Bld 126 (*) 70 - 99 (mg/dL)    BUN 16  6 - 23 (mg/dL)    Creatinine, Ser 7.84  0.50 - 1.10 (mg/dL)    Calcium 9.6  8.4 - 10.5 (mg/dL)  GFR calc non Af Amer >90  >90 (mL/min)    GFR calc Af Amer >90  >90 (mL/min)   PROTIME-INR     Status: Normal   Collection Time   02/03/12  4:35 AM      Component Value Range Comment   Prothrombin Time 13.2  11.6 - 15.2 (seconds)    INR 0.98  0.00 - 1.49    CBC     Status: Normal   Collection Time   02/03/12  4:35 AM      Component Value Range Comment   WBC  8.4  4.0 - 10.5 (K/uL)    RBC 4.20  3.87 - 5.11 (MIL/uL)    Hemoglobin 13.2  12.0 - 15.0 (g/dL)    HCT 40.9  81.1 - 91.4 (%)    MCV 93.3  78.0 - 100.0 (fL)    MCH 31.4  26.0 - 34.0 (pg)    MCHC 33.7  30.0 - 36.0 (g/dL)    RDW 78.2  95.6 - 21.3 (%)    Platelets 337  150 - 400 (K/uL)   GLUCOSE, CAPILLARY     Status: Abnormal   Collection Time   02/03/12  7:48 AM      Component Value Range Comment   Glucose-Capillary 140 (*) 70 - 99 (mg/dL)   GLUCOSE, CAPILLARY     Status: Abnormal   Collection Time   02/03/12  9:56 AM      Component Value Range Comment   Glucose-Capillary 121 (*) 70 - 99 (mg/dL)   GLUCOSE, CAPILLARY     Status: Abnormal   Collection Time   02/03/12 11:34 AM      Component Value Range Comment   Glucose-Capillary 156 (*) 70 - 99 (mg/dL)    Recent Results (from the past 240 hour(s))  MRSA PCR SCREENING     Status: Normal   Collection Time   02/02/12  2:41 AM      Component Value Range Status Comment   MRSA by PCR NEGATIVE  NEGATIVE  Final      Hospital Course:  51 year old woman with history of diabetes, strong family history for early coronary disease, and ongoing tobacco abuse presented to the emergency room with resting chest pain. Initial EKG did not show ST elevation or ST depression. She was placed on observation on telemetry cardiac unit. She remained chest pain-free during the admission. She was evaluated on hospital day #2 by Dr. York Ram who recommended cardiac catheterization. On April 5 patient underwent left heart catheterization by Dr. Nicki Guadalajara without significant coronary disease identified.    Discharge Exam: Blood pressure 111/65, pulse 61, temperature 98.4 F (36.9 C), temperature source Oral, resp. rate 20, height 5\' 6"  (1.676 m), weight 98.4 kg (216 lb 14.9 oz), SpO2 93.00%. Alert and oriented x3 Chest clear to auscultation without wheezes rhonchi crackles Heart regular rate and rhythm without murmurs Disposition: Home  Discharge Orders      Future Orders Please Complete By Expires   Diet - low sodium heart healthy      Increase activity slowly         Follow-up Information    Schedule an appointment as soon as possible for a visit with Sandrea Hughs, MD.   Contact information:   [address] [facility] [address] [place] [address] [place] [address] [phone]          Signed: Lonia Blood 02/03/2012, 2:04 PM

## 2012-02-03 NOTE — Brief Op Note (Signed)
02/01/2012 - 02/03/2012  10:07 AM  Cardiac Catherization PATIENT:  [name]  51 y.o. female  PRE-OPERATIVE DIAGNOSIS:  chest pain  Full note dictated; see diagram   DICTATION # [id number], [id number]  Normal coronaries Normal LV FXN No RAS.  Tolerated well.  Lennette Bihari, MD, [facility] 02/03/2012 10:07 AM

## 2012-02-03 NOTE — Discharge Instructions (Signed)
Call [facility] if any bleeding, swelling, increasing pain or drainage at cath site. [phone])  May shower in AM, no tub baths for 48 hours for groin sticks.   No driving for 2 days  No lifting for 3 days

## 2012-02-03 NOTE — Plan of Care (Signed)
Problem: Consults Goal: Cardiac Cath Patient Education (See Patient Education module for education specifics.) Outcome: Completed/Met Date Met:  02/03/12 Pt watched video.

## 2012-02-20 ENCOUNTER — Other Ambulatory Visit: Payer: Self-pay | Admitting: Internal Medicine

## 2012-02-20 NOTE — Telephone Encounter (Signed)
Advised patient that unfortunately, we have no samples of Nexium at this time. She states that she does have rx but copay is $35 per month. I have advised that she may take omeprazole OTC if needed and she may call back in the next couple of weeks to see if we have any samples at that time. I have also advised her that she will be due for a recall endoscopy in 05/2012. Patient verbalizes understanding.

## 2012-02-22 ENCOUNTER — Other Ambulatory Visit: Payer: Self-pay | Admitting: Internal Medicine

## 2012-03-06 ENCOUNTER — Other Ambulatory Visit: Payer: Self-pay | Admitting: Internal Medicine

## 2012-03-15 ENCOUNTER — Telehealth: Payer: Self-pay | Admitting: Internal Medicine

## 2012-03-15 NOTE — Telephone Encounter (Signed)
Called and spoke with pt and she stated that her symptoms started last night--per pt severe sore throat to where she can hardly swallow, chest feels tight, head congestion that  Will not come out.  Pt is aware that MW out of the office and i was going to make an appt for her but the pt stated that she will go to the doc in the box.  i still offered appt for the pt but she again declined.

## 2012-03-19 ENCOUNTER — Telehealth: Payer: Self-pay | Admitting: Adult Health

## 2012-03-19 NOTE — Telephone Encounter (Signed)
LMOM at home # to inform pt of schedule change and that her appt has been moved from 9am to 10:15am.  Advised pt to please call if this new appt time does not work for her.  Also called cell #, but had no answer and no option to leave a message.  Will sign off.

## 2012-03-20 ENCOUNTER — Encounter: Payer: Self-pay | Admitting: Internal Medicine

## 2012-03-22 ENCOUNTER — Ambulatory Visit: Payer: Managed Care, Other (non HMO) | Admitting: Adult Health

## 2012-03-28 ENCOUNTER — Ambulatory Visit: Payer: Managed Care, Other (non HMO) | Admitting: Adult Health

## 2012-04-11 ENCOUNTER — Ambulatory Visit: Payer: Managed Care, Other (non HMO) | Admitting: Adult Health

## 2012-04-18 ENCOUNTER — Encounter: Payer: Self-pay | Admitting: Internal Medicine

## 2012-05-03 ENCOUNTER — Other Ambulatory Visit: Payer: Self-pay | Admitting: Internal Medicine

## 2012-06-06 ENCOUNTER — Encounter: Payer: Self-pay | Admitting: Internal Medicine

## 2012-06-06 ENCOUNTER — Ambulatory Visit (AMBULATORY_SURGERY_CENTER): Payer: Managed Care, Other (non HMO) | Admitting: *Deleted

## 2012-06-06 VITALS — Ht 66.0 in | Wt 217.0 lb

## 2012-06-06 DIAGNOSIS — K2270 Barrett's esophagus without dysplasia: Secondary | ICD-10-CM

## 2012-06-20 ENCOUNTER — Encounter: Payer: Self-pay | Admitting: Internal Medicine

## 2012-06-20 ENCOUNTER — Ambulatory Visit (AMBULATORY_SURGERY_CENTER): Payer: Managed Care, Other (non HMO) | Admitting: Internal Medicine

## 2012-06-20 VITALS — BP 132/86 | HR 73 | Temp 98.9°F | Resp 17 | Ht 66.0 in | Wt 217.0 lb

## 2012-06-20 DIAGNOSIS — K2270 Barrett's esophagus without dysplasia: Secondary | ICD-10-CM

## 2012-06-20 DIAGNOSIS — K2970 Gastritis, unspecified, without bleeding: Secondary | ICD-10-CM

## 2012-06-20 DIAGNOSIS — K319 Disease of stomach and duodenum, unspecified: Secondary | ICD-10-CM

## 2012-06-20 MED ORDER — SODIUM CHLORIDE 0.9 % IV SOLN: 500.0000 mL | INTRAVENOUS | Status: DC

## 2012-06-20 NOTE — Patient Instructions (Addendum)

## 2012-06-20 NOTE — Op Note (Signed)
[facility] [address] [place], [address]   ENDOSCOPY PROCEDURE REPORT  PATIENT: Wendy, Owens  MR#: [id number] BIRTHDATE: 1961-09-21 , 51  yrs. old GENDER: Female ENDOSCOPIST: Hart Carwin, MD REFERRED BY:  Nyoka Cowden, M.D. PROCEDURE DATE:  06/20/2012 PROCEDURE:  EGD w/ biopsy ASA CLASS:     Class II INDICATIONS:  follow up of Barrett's esophagus.   EGD 05/2010 Barrett's esophagus. MEDICATIONS: MAC sedation, administered by CRNA, propofol (Diprivan) 200mg  IV, and Glycopyrrolate (Robinul) .2 mg IV TOPICAL ANESTHETIC: Cetacaine Spray  DESCRIPTION OF PROCEDURE: After the risks benefits and alternatives of the procedure were thoroughly explained, informed consent was obtained.  The LB-GIF Q180 [id number] endoscope was introduced through the mouth and advanced to the second portion of the duodenum. Without limitations.  The instrument was slowly withdrawn as the mucosa was fully examined.      STOMACH: Acute gastritis (inflammation) was found.  A biopsy was performed.  ESOPHAGUS: An irregular Z-line was observed. Biopsies taken to r/o Barrett's esophagus  Retroflexed views revealed no abnormalities. The scope was then withdrawn from the patient and the procedure completed.  COMPLICATIONS: There were no complications. ENDOSCOPIC IMPRESSION: 1.   Acute gastritis (inflammation) was found; biopsy 2.  irregular  Z-line was observed; biopsies taken  RECOMMENDATIONS: Await biopsy results  REPEAT EXAM: In 2 year(s)  for EGD pending biopsy results.  eSigned:  Hart Carwin, MD 06/20/2012 11:30 AM   CC:

## 2012-06-20 NOTE — Progress Notes (Signed)
Patient did not have preoperative order for IV antibiotic SSI prophylaxis. (G8918)  Patient did not experience any of the following events: a burn prior to discharge; a fall within the facility; wrong site/side/patient/procedure/implant event; or a hospital transfer or hospital admission upon discharge from the facility. (G8907)  

## 2012-06-21 ENCOUNTER — Telehealth: Payer: Self-pay | Admitting: *Deleted

## 2012-06-21 NOTE — Telephone Encounter (Signed)
  Follow up Call-  Call back number 06/20/2012  Post procedure Call Back phone  # [phone]  Permission to leave phone message Yes     Patient questions:  Do you have a fever, pain , or abdominal swelling? no Pain Score  0 *  Have you tolerated food without any problems? yes  Have you been able to return to your normal activities? yes  Do you have any questions about your discharge instructions: Diet   no Medications  no Follow up visit  no  Do you have questions or concerns about your Care? no  Actions: * If pain score is 4 or above: No action needed, pain <4.

## 2012-06-26 ENCOUNTER — Encounter: Payer: Self-pay | Admitting: Internal Medicine

## 2012-06-29 ENCOUNTER — Encounter: Payer: Self-pay | Admitting: Internal Medicine

## 2012-07-09 ENCOUNTER — Telehealth: Payer: Self-pay | Admitting: Internal Medicine

## 2012-07-09 ENCOUNTER — Other Ambulatory Visit: Payer: Self-pay | Admitting: *Deleted

## 2012-07-09 MED ORDER — ESOMEPRAZOLE MAGNESIUM 40 MG PO CPDR: 40.0000 mg | DELAYED_RELEASE_CAPSULE | Freq: Every day | ORAL | Status: DC

## 2012-07-09 NOTE — Telephone Encounter (Signed)
Spoke with patient and gave her results as per letter on 06/29/12

## 2012-07-10 ENCOUNTER — Other Ambulatory Visit: Payer: Self-pay | Admitting: Internal Medicine

## 2012-07-10 MED ORDER — EZETIMIBE-SIMVASTATIN 10-40 MG PO TABS: 1.0000 | ORAL_TABLET | Freq: Every day | ORAL | Status: DC

## 2012-07-10 MED ORDER — AMLODIPINE BESYLATE 5 MG PO TABS: 5.0000 mg | ORAL_TABLET | Freq: Every day | ORAL | Status: DC

## 2012-07-10 MED ORDER — HYDROCHLOROTHIAZIDE 25 MG PO TABS: 25.0000 mg | ORAL_TABLET | Freq: Every day | ORAL | Status: DC

## 2012-07-10 MED ORDER — FLUOXETINE HCL 40 MG PO CAPS: 40.0000 mg | ORAL_CAPSULE | Freq: Every day | ORAL | Status: AC

## 2012-07-10 MED ORDER — METFORMIN HCL ER 500 MG PO TB24: 500.0000 mg | ORAL_TABLET | Freq: Two times a day (BID) | ORAL | Status: DC

## 2012-07-10 NOTE — Telephone Encounter (Signed)
Received refill request from [employer] for hctz 25mg , fluoxetine 40mg , metformin 500mg , vytorin 10-40mg , amlodipine 5mg  for 90 day supply.  Pt last in office by MW 1.25.13, follow up in 6 weeks >> has cancelled 3 appts and NS for 3 appts.  Will send 1 refill on each medication with note that pt MUST be seen in office to continue receiving refills.

## 2012-08-07 ENCOUNTER — Encounter: Payer: Managed Care, Other (non HMO) | Admitting: Internal Medicine

## 2012-08-07 NOTE — Progress Notes (Signed)
 This encounter was created in error - please disregard.

## 2012-08-22 ENCOUNTER — Encounter: Payer: Managed Care, Other (non HMO) | Admitting: Internal Medicine

## 2012-08-22 NOTE — Progress Notes (Signed)
 This encounter was created in error - please disregard.

## 2012-08-23 ENCOUNTER — Encounter: Payer: Self-pay | Admitting: *Deleted

## 2012-08-23 ENCOUNTER — Telehealth: Payer: Self-pay | Admitting: *Deleted

## 2012-08-23 NOTE — Telephone Encounter (Signed)
Message copied by Christen Butter on Thu Aug 23, 2012 10:06 AM ------      Message from: [name]      Created: Wed Aug 22, 2012  3:27 PM       Let pt know that because  she missed 2 last appts  I would like her to see another primary but I will cover her for 30 days for emergencies (ok to send letter)

## 2012-08-23 NOTE — Telephone Encounter (Signed)
Letter printed and sent down to [employer]

## 2012-08-24 ENCOUNTER — Telehealth: Payer: Self-pay | Admitting: Internal Medicine

## 2012-08-24 NOTE — Telephone Encounter (Signed)
Dismissal Letter sent by Certified Mail 08/24/2012  Dismissal Letter returned Unclaimed 09/17/2012  Dismissal Letter sent by 1st Class Mail to address [address] [place] 09/18/2012

## 2013-03-18 ENCOUNTER — Encounter (HOSPITAL_COMMUNITY): Payer: Self-pay

## 2013-03-18 ENCOUNTER — Emergency Department (HOSPITAL_COMMUNITY)
Admission: EM | Admit: 2013-03-18 | Discharge: 2013-03-18 | Disposition: A | Discharge status: Home / Self Care | Payer: Managed Care, Other (non HMO) | Attending: Emergency Medicine | Admitting: Emergency Medicine

## 2013-03-18 DIAGNOSIS — E785 Hyperlipidemia, unspecified: Secondary | ICD-10-CM | POA: Insufficient documentation

## 2013-03-18 DIAGNOSIS — F3289 Other specified depressive episodes: Secondary | ICD-10-CM | POA: Insufficient documentation

## 2013-03-18 DIAGNOSIS — F411 Generalized anxiety disorder: Secondary | ICD-10-CM | POA: Insufficient documentation

## 2013-03-18 DIAGNOSIS — Z8739 Personal history of other diseases of the musculoskeletal system and connective tissue: Secondary | ICD-10-CM | POA: Insufficient documentation

## 2013-03-18 DIAGNOSIS — I10 Essential (primary) hypertension: Secondary | ICD-10-CM | POA: Insufficient documentation

## 2013-03-18 DIAGNOSIS — R112 Nausea with vomiting, unspecified: Secondary | ICD-10-CM | POA: Insufficient documentation

## 2013-03-18 DIAGNOSIS — E669 Obesity, unspecified: Secondary | ICD-10-CM | POA: Insufficient documentation

## 2013-03-18 DIAGNOSIS — E119 Type 2 diabetes mellitus without complications: Secondary | ICD-10-CM | POA: Insufficient documentation

## 2013-03-18 DIAGNOSIS — Z8701 Personal history of pneumonia (recurrent): Secondary | ICD-10-CM | POA: Insufficient documentation

## 2013-03-18 DIAGNOSIS — R51 Headache: Secondary | ICD-10-CM

## 2013-03-18 DIAGNOSIS — F329 Major depressive disorder, single episode, unspecified: Secondary | ICD-10-CM | POA: Insufficient documentation

## 2013-03-18 DIAGNOSIS — Z8719 Personal history of other diseases of the digestive system: Secondary | ICD-10-CM | POA: Insufficient documentation

## 2013-03-18 DIAGNOSIS — Z79899 Other long term (current) drug therapy: Secondary | ICD-10-CM | POA: Insufficient documentation

## 2013-03-18 DIAGNOSIS — K219 Gastro-esophageal reflux disease without esophagitis: Secondary | ICD-10-CM | POA: Insufficient documentation

## 2013-03-18 DIAGNOSIS — Z87442 Personal history of urinary calculi: Secondary | ICD-10-CM | POA: Insufficient documentation

## 2013-03-18 DIAGNOSIS — Z8742 Personal history of other diseases of the female genital tract: Secondary | ICD-10-CM | POA: Insufficient documentation

## 2013-03-18 DIAGNOSIS — Z87891 Personal history of nicotine dependence: Secondary | ICD-10-CM | POA: Insufficient documentation

## 2013-03-18 DIAGNOSIS — Z880 Allergy status to penicillin: Secondary | ICD-10-CM | POA: Insufficient documentation

## 2013-03-18 MED ORDER — DIAZEPAM 5 MG PO TABS: 5.0000 mg | ORAL_TABLET | Freq: Two times a day (BID) | ORAL | Status: DC

## 2013-03-18 MED ORDER — DICLOFENAC EPOLAMINE 1.3 % TD PTCH: 1.0000 | MEDICATED_PATCH | Freq: Two times a day (BID) | TRANSDERMAL | Status: DC

## 2013-03-18 NOTE — ED Notes (Signed)
Patient reports that she has had posterior neck and posterior head pain. Patient has seen an ENT and PCP. Patient states she has been vomiting and having increased pain her head and neck. Patient states she has been lifting a grandchild a lot frequently over the past 2 weeks. Patient states she has taken Vicodin, flexeril, and Tramadol with no relief.

## 2013-03-18 NOTE — ED Provider Notes (Signed)
History     CSN: [id number]  Arrival date & time 03/18/13  1243   First MD Initiated Contact with Patient 03/18/13 1315      Chief Complaint  Patient presents with  . Headache    (Consider location/radiation/quality/duration/timing/severity/associated sxs/prior treatment) HPI  Patient presents with concerns headache. Pain is focally about the occiput and superior neck. She has had episodes in the past, and has been seen by ENT, primary care for this. She states that in the past 3 days she's had increasing, severe pain focally in these areas. There is associated nausea with vomiting. There is some relief with Vicodin. There is no extremity dysesthesia or weakness, no loss of vision. There is no confusion, disorientation, or falls. Chest pain, dyspnea. The patient states that when she previously had headaches, she was doing a lot of lifting of her grandchild. When she stopped doing this, her headaches improved. She has recently resumed this activity.  Past Medical History  Diagnosis Date  . Interstitial cystitis   . GERD (gastroesophageal reflux disease)   . Hypertension   . DDD (degenerative disc disease)     s/p back surgeries and steroid injxns  . Diabetes mellitus   . Hyperlipidemia   . Obesity   . Depression   . Anxiety   . Barrett esophagus     Dr. Juanda Chance in [facility]  . Pneumonia   . Kidney stones     Past Surgical History  Procedure Laterality Date  . Neck surgery  12/2007  . Back surgery  12/2007  . Appendectomy    . Total abdominal hysterectomy    . Cholecystectomy    . Colonoscopy  2011  . Upper gi endoscopy      Family History  Problem Relation Age of Onset  . Hypertension Father   . Heart disease Mother     ischemic  . Allergies Sister   . Asthma Sister   . Gallbladder disease Mother   . Colon polyps Father   . Other Mother     rectovaginal fistula    History  Substance Use Topics  . Smoking status: Former Smoker -- 0.30 packs/day for 17  years    Types: Cigarettes    Quit date: 11/18/2011  . Smokeless tobacco: Never Used  . Alcohol Use: Yes     Comment: rare    OB History   Grav Para Term Preterm Abortions TAB SAB Ect Mult Living                  Review of Systems  All other systems reviewed and are negative.    Allergies  Latex; Penicillins; Erythromycin; Ofloxacin; and Sulfonamide derivatives  Home Medications   Current Outpatient Rx  Name  Route  Sig  Dispense  Refill  . ACCU-CHEK AVIVA PLUS test strip      TEST 3 TIMES DAILY   100 strip   5   . amLODipine (NORVASC) 5 MG tablet   Oral   Take 1 tablet (5 mg total) by mouth daily. NEEDS APPT FOR REFILLS   90 tablet   0   . dicyclomine (BENTYL) 20 MG tablet   Oral   Take 20 mg by mouth 2 (two) times daily as needed. For abdominal pain         . esomeprazole (NEXIUM) 40 MG capsule   Oral   Take 1 capsule (40 mg total) by mouth daily before breakfast.   90 capsule   1   . ezetimibe-simvastatin (  VYTORIN) 10-40 MG per tablet   Oral   Take 1 tablet by mouth at bedtime. NEEDS APPT FOR REFILLS   90 tablet   0   . FLUoxetine (PROZAC) 40 MG capsule   Oral   Take 1 capsule (40 mg total) by mouth daily. NEEDS APPT FOR REFILLS   90 capsule   0   . hydrochlorothiazide (HYDRODIURIL) 25 MG tablet   Oral   Take 1 tablet (25 mg total) by mouth daily. NEEDS APPT FOR REFILLS   90 tablet   0   . LORazepam (ATIVAN) 0.5 MG tablet   Oral   Take 1 mg by mouth at bedtime as needed. For insomnia         . metFORMIN (GLUCOPHAGE-XR) 500 MG 24 hr tablet   Oral   Take 1,000 mg by mouth daily. NEEDS APPT FOR REFILLS         . traMADol (ULTRAM) 50 MG tablet   Oral   Take 50 mg by mouth every 6 (six) hours as needed for pain.         Marland Kitchen HYDROcodone-homatropine (HYCODAN) 5-1.5 MG/5ML syrup               . levofloxacin (LEVAQUIN) 750 MG tablet                 BP 155/88  Pulse 66  Temp(Src) 98.7 F (37.1 C) (Oral)  Resp 16  Ht 5\' 6"   (1.676 m)  Wt 221 lb 2 oz (100.302 kg)  BMI 35.71 kg/m2  SpO2 97%  Physical Exam  Nursing note and vitals reviewed. Constitutional: She is oriented to person, place, and time. She appears well-developed and well-nourished. No distress.  HENT:  Head: Normocephalic and atraumatic.  Nose: Nose normal.  Eyes: Conjunctivae and EOM are normal.  Neck: Trachea normal. Neck supple. Muscular tenderness present. No spinous process tenderness present. No rigidity. No edema, no erythema and normal range of motion present.  Cardiovascular: Normal rate and regular rhythm.   Pulmonary/Chest: Effort normal and breath sounds normal. No stridor. No respiratory distress.  Abdominal: She exhibits no distension.  Musculoskeletal: She exhibits no edema.  Neurological: She is alert and oriented to person, place, and time. No cranial nerve deficit.  Skin: Skin is warm and dry.  Psychiatric: She has a normal mood and affect.    ED Course  Procedures (including critical care time)  Labs Reviewed - No data to display No results found.   1. Headache       MDM  Patient presents with concern, headache, focally about the occiput with radiation to the neck.  On exam she is awake and alert, with no neurologic deficits, hemodynamically stable. Given the patient's description of headache but returned when she began strenuous activity, there suspicion for a musculoskeletal etiology we discussed a trial of medication, with prompt followup with both her neurologist in her spine specialist she has a history of discectomy with artificial placement of disc.  Absent fever, distress, weakness, there is low suspicion for acute infection or vascular process.        Gerhard Munch, MD 03/18/13 1409

## 2013-04-05 ENCOUNTER — Encounter (HOSPITAL_COMMUNITY): Payer: Self-pay | Admitting: Emergency Medicine

## 2013-04-05 ENCOUNTER — Emergency Department (HOSPITAL_COMMUNITY)
Admission: EM | Admit: 2013-04-05 | Discharge: 2013-04-05 | Disposition: A | Discharge status: Home / Self Care | Payer: Managed Care, Other (non HMO) | Attending: Emergency Medicine | Admitting: Emergency Medicine

## 2013-04-05 DIAGNOSIS — F411 Generalized anxiety disorder: Secondary | ICD-10-CM | POA: Insufficient documentation

## 2013-04-05 DIAGNOSIS — I10 Essential (primary) hypertension: Secondary | ICD-10-CM | POA: Insufficient documentation

## 2013-04-05 DIAGNOSIS — Z880 Allergy status to penicillin: Secondary | ICD-10-CM | POA: Insufficient documentation

## 2013-04-05 DIAGNOSIS — Z87891 Personal history of nicotine dependence: Secondary | ICD-10-CM | POA: Insufficient documentation

## 2013-04-05 DIAGNOSIS — J029 Acute pharyngitis, unspecified: Secondary | ICD-10-CM | POA: Insufficient documentation

## 2013-04-05 DIAGNOSIS — IMO0002 Reserved for concepts with insufficient information to code with codable children: Secondary | ICD-10-CM | POA: Insufficient documentation

## 2013-04-05 DIAGNOSIS — Z87442 Personal history of urinary calculi: Secondary | ICD-10-CM | POA: Insufficient documentation

## 2013-04-05 DIAGNOSIS — F329 Major depressive disorder, single episode, unspecified: Secondary | ICD-10-CM | POA: Insufficient documentation

## 2013-04-05 DIAGNOSIS — Z87448 Personal history of other diseases of urinary system: Secondary | ICD-10-CM | POA: Insufficient documentation

## 2013-04-05 DIAGNOSIS — H6091 Unspecified otitis externa, right ear: Secondary | ICD-10-CM

## 2013-04-05 DIAGNOSIS — E119 Type 2 diabetes mellitus without complications: Secondary | ICD-10-CM | POA: Insufficient documentation

## 2013-04-05 DIAGNOSIS — F3289 Other specified depressive episodes: Secondary | ICD-10-CM | POA: Insufficient documentation

## 2013-04-05 DIAGNOSIS — H60399 Other infective otitis externa, unspecified ear: Secondary | ICD-10-CM | POA: Insufficient documentation

## 2013-04-05 DIAGNOSIS — Z91040 Latex allergy status: Secondary | ICD-10-CM | POA: Insufficient documentation

## 2013-04-05 DIAGNOSIS — E669 Obesity, unspecified: Secondary | ICD-10-CM | POA: Insufficient documentation

## 2013-04-05 DIAGNOSIS — K219 Gastro-esophageal reflux disease without esophagitis: Secondary | ICD-10-CM | POA: Insufficient documentation

## 2013-04-05 DIAGNOSIS — Z8701 Personal history of pneumonia (recurrent): Secondary | ICD-10-CM | POA: Insufficient documentation

## 2013-04-05 DIAGNOSIS — Z79899 Other long term (current) drug therapy: Secondary | ICD-10-CM | POA: Insufficient documentation

## 2013-04-05 DIAGNOSIS — Z8719 Personal history of other diseases of the digestive system: Secondary | ICD-10-CM | POA: Insufficient documentation

## 2013-04-05 DIAGNOSIS — R197 Diarrhea, unspecified: Secondary | ICD-10-CM | POA: Insufficient documentation

## 2013-04-05 MED ORDER — AMOXICILLIN 500 MG PO CAPS: 500.0000 mg | ORAL_CAPSULE | Freq: Three times a day (TID) | ORAL | Status: DC

## 2013-04-05 MED ORDER — HYDROCODONE-ACETAMINOPHEN 5-325 MG PO TABS
1.0000 | ORAL_TABLET | Freq: Once | ORAL | Status: AC
  Administered 2013-04-05: 1 via ORAL
  Filled 2013-04-05: qty 1

## 2013-04-05 MED ORDER — ANTIPYRINE-BENZOCAINE 5.4-1.4 % OT SOLN
3.0000 [drp] | OTIC | Status: DC | PRN
  Administered 2013-04-05: 3 [drp] via OTIC
  Filled 2013-04-05: qty 10

## 2013-04-05 MED ORDER — NEOMYCIN-POLYMYXIN-HC 3.5-10000-1 OT SUSP: 4.0000 [drp] | Freq: Three times a day (TID) | OTIC | Status: DC

## 2013-04-05 MED ORDER — AZITHROMYCIN 250 MG PO TABS: 250.0000 mg | ORAL_TABLET | Freq: Every day | ORAL | Status: DC

## 2013-04-05 NOTE — ED Notes (Signed)
Sudden onset right ear pain, sharp. States has been hoarse the past 2 days. States "everone in my house is sick" and on abx.

## 2013-04-05 NOTE — ED Provider Notes (Signed)
History  This chart was scribed for Marlon Pel, MS, PA-C working with Derwood Kaplan, MD by Ardelia Mems, ED Scribe. This patient was seen in room TR06C/TR06C and the patient's care was started at 4:02 PM.   CSN: [id number]  Arrival date & time 04/05/13  1549     No chief complaint on file.    The history is provided by the patient. No language interpreter was used.    HPI Comments: [name] is a 52 y.o. female who presents to the Emergency Department complaining of sudden onset, gradually worsening, constant, moderate "sharp" right ear pain onset 1 hour ago. Pt also reports associated diarrhea times 1 episode today. Her whole house is sick and she is the last one to get the symptoms. She states that she has been hoarse for the past 2 days. Pt states that "everyone in my house is sick" and is on antibiotics. Pt states that she also has laryngitis. Pt denies swimming recently. Pt states that she got ear infections frequently as a kid. Pt denies sore throat, cough, eye pain, throat pain, fever, chills, nausea, vomiting or any other symptoms.     Past Medical History  Diagnosis Date  . Interstitial cystitis   . GERD (gastroesophageal reflux disease)   . Hypertension   . DDD (degenerative disc disease)     s/p back surgeries and steroid injxns  . Diabetes mellitus   . Hyperlipidemia   . Obesity   . Depression   . Anxiety   . Barrett esophagus     Dr. Juanda Chance in [facility]  . Pneumonia   . Kidney stones     Past Surgical History  Procedure Laterality Date  . Neck surgery  12/2007  . Back surgery  12/2007  . Appendectomy    . Total abdominal hysterectomy    . Cholecystectomy    . Colonoscopy  2011  . Upper gi endoscopy      Family History  Problem Relation Age of Onset  . Hypertension Father   . Heart disease Mother     ischemic  . Allergies Sister   . Asthma Sister   . Gallbladder disease Mother   . Colon polyps Father   . Other Mother     rectovaginal fistula     History  Substance Use Topics  . Smoking status: Former Smoker -- 0.30 packs/day for 17 years    Types: Cigarettes    Quit date: 11/18/2011  . Smokeless tobacco: Never Used  . Alcohol Use: Yes     Comment: rare    OB History   Grav Para Term Preterm Abortions TAB SAB Ect Mult Living                  Review of Systems  Constitutional: Negative for fever and chills.  HENT: Positive for ear pain (right-sided). Negative for sore throat.   Eyes: Negative for pain.  Respiratory: Negative for cough.   Gastrointestinal: Positive for diarrhea. Negative for nausea, vomiting and abdominal pain.   A complete 10 system review of systems was obtained and all systems are negative except as noted in the HPI and PMH.   Allergies  Latex; Penicillins; Erythromycin; Ofloxacin; and Sulfonamide derivatives  Home Medications   Current Outpatient Rx  Name  Route  Sig  Dispense  Refill  . ACCU-CHEK AVIVA PLUS test strip      TEST 3 TIMES DAILY   100 strip   5   . amLODipine (NORVASC) 5 MG  tablet   Oral   Take 1 tablet (5 mg total) by mouth daily. NEEDS APPT FOR REFILLS   90 tablet   0   . amoxicillin (AMOXIL) 500 MG capsule   Oral   Take 1 capsule (500 mg total) by mouth 3 (three) times daily.   21 capsule   0   . diazepam (VALIUM) 5 MG tablet   Oral   Take 1 tablet (5 mg total) by mouth 2 (two) times daily.   10 tablet   0   . dicyclomine (BENTYL) 20 MG tablet   Oral   Take 20 mg by mouth 2 (two) times daily as needed. For abdominal pain         . esomeprazole (NEXIUM) 40 MG capsule   Oral   Take 1 capsule (40 mg total) by mouth daily before breakfast.   90 capsule   1   . ezetimibe-simvastatin (VYTORIN) 10-40 MG per tablet   Oral   Take 1 tablet by mouth at bedtime. NEEDS APPT FOR REFILLS   90 tablet   0   . FLUoxetine (PROZAC) 40 MG capsule   Oral   Take 1 capsule (40 mg total) by mouth daily. NEEDS APPT FOR REFILLS   90 capsule   0   .  hydrochlorothiazide (HYDRODIURIL) 25 MG tablet   Oral   Take 1 tablet (25 mg total) by mouth daily. NEEDS APPT FOR REFILLS   90 tablet   0   . metFORMIN (GLUCOPHAGE-XR) 500 MG 24 hr tablet   Oral   Take 1,000 mg by mouth daily. NEEDS APPT FOR REFILLS         . neomycin-polymyxin-hydrocortisone (CORTISPORIN) 3.5-10000-1 otic suspension   Otic   Place 4 drops in ear(s) 3 (three) times daily.   10 mL   0   . traMADol (ULTRAM) 50 MG tablet   Oral   Take 50 mg by mouth every 6 (six) hours as needed for pain.           Triage Vitals: BP 173/88  Pulse 70  Temp(Src) 97.9 F (36.6 C) (Oral)  Resp 20  SpO2 98%  Physical Exam  Nursing note and vitals reviewed. Constitutional: She is oriented to person, place, and time. She appears well-developed and well-nourished. No distress.  HENT:  Head: Normocephalic and atraumatic.  Right Ear: There is swelling and tenderness. A middle ear effusion is present.  Left Ear: Tympanic membrane and ear canal normal.  Mouth/Throat: Posterior oropharyngeal erythema present. No oropharyngeal exudate or posterior oropharyngeal edema.  Eyes: EOM are normal.  Neck: Normal range of motion.  Cardiovascular: Normal rate, regular rhythm and normal heart sounds.   Pulmonary/Chest: Effort normal and breath sounds normal.  Abdominal: Soft. She exhibits no distension. There is no tenderness.  Musculoskeletal: Normal range of motion.  Neurological: She is alert and oriented to person, place, and time.  Skin: Skin is warm and dry.  Psychiatric: She has a normal mood and affect. Judgment normal.    ED Course  Procedures (including critical care time)  DIAGNOSTIC STUDIES: Oxygen Saturation is 98% on RA, normal by my interpretation.    COORDINATION OF CARE: 4:10 PM- Pt advised of plan for treatment and pt agrees.  Medications  antipyrine-benzocaine (AURALGAN) otic solution 3-4 drop (not administered)   neomycin-polymyxin-hydrocortisone (CORTISPORIN)  3.5-10000-1 otic suspension Place 4 drops in ear(s) 3 (three) times daily. 10 mL Dorthula Matas, PA-C  amoxicillin (AMOXIL) 500 MG capsule Take 1 capsule (500 mg  total) by mouth 3 (three) times daily. 21 capsule Dorthula Matas, PA-C  Oral antibiotic Ear drop antibiotic Ear drop pain medication Referral to ENT  Labs Reviewed - No data to display No results found.   1. Pharyngitis   2. Otitis externa of right ear       MDM   52 y.o.Tene Gato Babich's evaluation in the Emergency Department is complete. It has been determined that no acute conditions requiring further emergency intervention are present at this time. The patient/guardian have been advised of the diagnosis and plan. We have discussed signs and symptoms that warrant return to the ED, such as changes or worsening in symptoms.  Vital signs are stable at discharge. Filed Vitals:   04/05/13 1602  BP: 161/93  Pulse: 76  Temp: 97.4 F (36.3 C)  Resp:     Patient/guardian has voiced understanding and agreed to follow-up with the PCP or specialist.  I personally performed the services described in this documentation, which was scribed in my presence. The recorded information has been reviewed and is accurate.    Dorthula Matas, PA-C 04/05/13 1620

## 2013-04-10 NOTE — ED Provider Notes (Signed)
Medical screening examination/treatment/procedure(s) were performed by non-physician practitioner and as supervising physician I was immediately available for consultation/collaboration.  Derwood Kaplan, MD 04/10/13 [phone]

## 2013-07-02 ENCOUNTER — Other Ambulatory Visit: Payer: Self-pay | Admitting: Neurosurgery

## 2013-07-02 DIAGNOSIS — M5416 Radiculopathy, lumbar region: Secondary | ICD-10-CM

## 2013-07-08 ENCOUNTER — Ambulatory Visit
Admission: RE | Admit: 2013-07-08 | Discharge: 2013-07-08 | Disposition: A | Discharge status: Home / Self Care | Payer: Managed Care, Other (non HMO) | Source: Ambulatory Visit | Attending: Neurosurgery | Admitting: Neurosurgery

## 2013-07-08 VITALS — BP 136/74 | HR 64

## 2013-07-08 DIAGNOSIS — M5416 Radiculopathy, lumbar region: Secondary | ICD-10-CM

## 2013-07-08 DIAGNOSIS — IMO0002 Reserved for concepts with insufficient information to code with codable children: Secondary | ICD-10-CM

## 2013-07-08 MED ORDER — ONDANSETRON HCL 4 MG/2ML IJ SOLN
4.0000 mg | Freq: Once | INTRAMUSCULAR | Status: AC
  Administered 2013-07-08: 4 mg via INTRAMUSCULAR

## 2013-07-08 MED ORDER — DIAZEPAM 5 MG PO TABS
10.0000 mg | ORAL_TABLET | Freq: Once | ORAL | Status: AC
  Administered 2013-07-08: 10 mg via ORAL

## 2013-07-08 MED ORDER — HYDROMORPHONE HCL PF 2 MG/ML IJ SOLN
2.0000 mg | Freq: Once | INTRAMUSCULAR | Status: AC
  Administered 2013-07-08: 2 mg via INTRAMUSCULAR

## 2013-07-08 MED ORDER — IOHEXOL 180 MG/ML  SOLN
20.0000 mL | Freq: Once | INTRAMUSCULAR | Status: AC | PRN
  Administered 2013-07-08: 20 mL via INTRATHECAL

## 2013-07-08 NOTE — Progress Notes (Signed)
Pt has been off fluoxetine and tramadol for the past 2 days. Discharge instructions explained to pt.

## 2013-07-08 NOTE — Progress Notes (Signed)
Patient states she has been off Tramadol and Fluoxetine for at least the past two days.  jkl

## 2013-07-23 ENCOUNTER — Encounter (HOSPITAL_COMMUNITY): Payer: Self-pay

## 2013-07-23 ENCOUNTER — Emergency Department (HOSPITAL_COMMUNITY): Payer: Managed Care, Other (non HMO)

## 2013-07-23 ENCOUNTER — Emergency Department (HOSPITAL_COMMUNITY)
Admission: EM | Admit: 2013-07-23 | Discharge: 2013-07-23 | Disposition: A | Discharge status: Home / Self Care | Payer: Managed Care, Other (non HMO) | Attending: Emergency Medicine | Admitting: Emergency Medicine

## 2013-07-23 DIAGNOSIS — E119 Type 2 diabetes mellitus without complications: Secondary | ICD-10-CM | POA: Insufficient documentation

## 2013-07-23 DIAGNOSIS — E669 Obesity, unspecified: Secondary | ICD-10-CM | POA: Insufficient documentation

## 2013-07-23 DIAGNOSIS — Z862 Personal history of diseases of the blood and blood-forming organs and certain disorders involving the immune mechanism: Secondary | ICD-10-CM | POA: Insufficient documentation

## 2013-07-23 DIAGNOSIS — F3289 Other specified depressive episodes: Secondary | ICD-10-CM | POA: Insufficient documentation

## 2013-07-23 DIAGNOSIS — I10 Essential (primary) hypertension: Secondary | ICD-10-CM | POA: Insufficient documentation

## 2013-07-23 DIAGNOSIS — R110 Nausea: Secondary | ICD-10-CM | POA: Insufficient documentation

## 2013-07-23 DIAGNOSIS — Z9089 Acquired absence of other organs: Secondary | ICD-10-CM | POA: Insufficient documentation

## 2013-07-23 DIAGNOSIS — Z79899 Other long term (current) drug therapy: Secondary | ICD-10-CM | POA: Insufficient documentation

## 2013-07-23 DIAGNOSIS — Z87891 Personal history of nicotine dependence: Secondary | ICD-10-CM | POA: Insufficient documentation

## 2013-07-23 DIAGNOSIS — Z91040 Latex allergy status: Secondary | ICD-10-CM | POA: Insufficient documentation

## 2013-07-23 DIAGNOSIS — F411 Generalized anxiety disorder: Secondary | ICD-10-CM | POA: Insufficient documentation

## 2013-07-23 DIAGNOSIS — Z8639 Personal history of other endocrine, nutritional and metabolic disease: Secondary | ICD-10-CM | POA: Insufficient documentation

## 2013-07-23 DIAGNOSIS — K219 Gastro-esophageal reflux disease without esophagitis: Secondary | ICD-10-CM | POA: Insufficient documentation

## 2013-07-23 DIAGNOSIS — F329 Major depressive disorder, single episode, unspecified: Secondary | ICD-10-CM | POA: Insufficient documentation

## 2013-07-23 DIAGNOSIS — Z8739 Personal history of other diseases of the musculoskeletal system and connective tissue: Secondary | ICD-10-CM | POA: Insufficient documentation

## 2013-07-23 DIAGNOSIS — Z90710 Acquired absence of both cervix and uterus: Secondary | ICD-10-CM | POA: Insufficient documentation

## 2013-07-23 DIAGNOSIS — Z880 Allergy status to penicillin: Secondary | ICD-10-CM | POA: Insufficient documentation

## 2013-07-23 DIAGNOSIS — Z8701 Personal history of pneumonia (recurrent): Secondary | ICD-10-CM | POA: Insufficient documentation

## 2013-07-23 DIAGNOSIS — N200 Calculus of kidney: Secondary | ICD-10-CM

## 2013-07-23 LAB — URINALYSIS, ROUTINE W REFLEX MICROSCOPIC
Glucose, UA: >1000 mg/dL — AB
Ketones, ur: 15 mg/dL — AB
Protein, ur: NEGATIVE mg/dL
pH: 5 (ref 5.0–8.0)

## 2013-07-23 LAB — BASIC METABOLIC PANEL
Calcium: 9.7 mg/dL (ref 8.4–10.5)
GFR calc Af Amer: >90 mL/min (ref >90)
GFR calc non Af Amer: >90 mL/min (ref >90)
Glucose, Bld: 235 mg/dL — ABNORMAL HIGH (ref 70–99)
Sodium: 134 mEq/L — ABNORMAL LOW (ref 135–145)

## 2013-07-23 LAB — URINE MICROSCOPIC-ADD ON

## 2013-07-23 LAB — CBC WITH DIFFERENTIAL/PLATELET
Basophils Absolute: 0 10*3/uL (ref 0.0–0.1)
Basophils Relative: 1 % (ref 0–1)
Eosinophils Absolute: 0.4 10*3/uL (ref 0.0–0.7)
Eosinophils Relative: 5 % (ref 0–5)
Lymphs Abs: 3.1 10*3/uL (ref 0.7–4.0)
MCH: 31.7 pg (ref 26.0–34.0)
MCHC: 35.5 g/dL (ref 30.0–36.0)
MCV: 89.2 fL (ref 78.0–100.0)
Platelets: 327 10*3/uL (ref 150–400)
RDW: 12.4 % (ref 11.5–15.5)

## 2013-07-23 LAB — GLUCOSE, CAPILLARY: Glucose-Capillary: 290 mg/dL — ABNORMAL HIGH (ref 70–99)

## 2013-07-23 MED ORDER — TAMSULOSIN HCL 0.4 MG PO CAPS: 0.4000 mg | ORAL_CAPSULE | Freq: Every day | ORAL | Status: DC

## 2013-07-23 MED ORDER — ONDANSETRON 8 MG PO TBDP: 8.0000 mg | ORAL_TABLET | Freq: Three times a day (TID) | ORAL | Status: DC | PRN

## 2013-07-23 MED ORDER — HYDROCODONE-ACETAMINOPHEN 5-325 MG PO TABS: 1.0000 | ORAL_TABLET | Freq: Four times a day (QID) | ORAL | Status: DC | PRN

## 2013-07-23 MED ORDER — METOCLOPRAMIDE HCL 5 MG/ML IJ SOLN
10.0000 mg | Freq: Once | INTRAMUSCULAR | Status: AC
  Administered 2013-07-23: 10 mg via INTRAVENOUS
  Filled 2013-07-23: qty 2

## 2013-07-23 MED ORDER — HYDROCODONE-ACETAMINOPHEN 5-325 MG PO TABS
2.0000 | ORAL_TABLET | Freq: Once | ORAL | Status: AC
  Administered 2013-07-23: 2 via ORAL
  Filled 2013-07-23: qty 2

## 2013-07-23 MED ORDER — KETOROLAC TROMETHAMINE 30 MG/ML IJ SOLN
30.0000 mg | Freq: Once | INTRAMUSCULAR | Status: AC
  Administered 2013-07-23: 30 mg via INTRAVENOUS
  Filled 2013-07-23: qty 1

## 2013-07-23 NOTE — ED Notes (Signed)
Pt returned from ultrasound

## 2013-07-23 NOTE — ED Notes (Signed)
Upon entering room to round on patient, pt states she wishes to have her IV removed due to pain at sight, attempted to reposition IV and retape IV, pt states pain continued, no redness or swelling noted, pt continues to request IV to be removed, IV removed per request, MD aware.

## 2013-07-23 NOTE — ED Provider Notes (Signed)
CSN: [id number]     Arrival date & time 07/23/13  1330 History   First MD Initiated Contact with Patient 07/23/13 1610     Chief Complaint  Patient presents with  . Flank Pain   (Consider location/radiation/quality/duration/timing/severity/associated sxs/prior Treatment) HPI Comments: 52 y/o patient with hx of DN, s/p TAH BSO, appendectomy, cholecystectomy comes in with cc of right sided flank pain. Pt has hx of multiple stones, and states that her current pain feels just like her kidney stone. The pain is on the right side, flank and lower quadrant. + nausea. No UTI like sx, no vaginal discharge, bleeding.  Patient is a 52 y.o. female presenting with flank pain. The history is provided by the patient.  Flank Pain Pertinent negatives include no chest pain, no abdominal pain, no headaches and no shortness of breath.    Past Medical History  Diagnosis Date  . Interstitial cystitis   . GERD (gastroesophageal reflux disease)   . Hypertension   . DDD (degenerative disc disease)     s/p back surgeries and steroid injxns  . Diabetes mellitus   . Hyperlipidemia   . Obesity   . Depression   . Anxiety   . Barrett esophagus     Dr. Juanda Chance in GI  . Pneumonia   . Kidney stones    Past Surgical History  Procedure Laterality Date  . Neck surgery  12/2007  . Back surgery  12/2007  . Appendectomy    . Total abdominal hysterectomy    . Cholecystectomy    . Colonoscopy  2011  . Upper gi endoscopy     Family History  Problem Relation Age of Onset  . Hypertension Father   . Heart disease Mother     ischemic  . Allergies Sister   . Asthma Sister   . Gallbladder disease Mother   . Colon polyps Father   . Other Mother     rectovaginal fistula   History  Substance Use Topics  . Smoking status: Former Smoker -- 0.30 packs/day for 17 years    Types: Cigarettes    Quit date: 11/18/2011  . Smokeless tobacco: Never Used  . Alcohol Use: Yes     Comment: rare   OB History   Grav Para  Term Preterm Abortions TAB SAB Ect Mult Living                 Review of Systems  Constitutional: Negative for fever, chills and activity change.  HENT: Negative for neck pain.   Respiratory: Negative for shortness of breath.   Cardiovascular: Negative for chest pain.  Gastrointestinal: Positive for nausea. Negative for vomiting and abdominal pain.  Genitourinary: Positive for flank pain. Negative for dysuria.  Neurological: Negative for headaches.    Allergies  Latex; Penicillins; Erythromycin; Ofloxacin; and Sulfonamide derivatives  Home Medications   Current Outpatient Rx  Name  Route  Sig  Dispense  Refill  . ACCU-CHEK AVIVA PLUS test strip      TEST 3 TIMES DAILY   100 strip   5   . amLODipine (NORVASC) 5 MG tablet   Oral   Take 5 mg by mouth daily.         . diazepam (VALIUM) 5 MG tablet   Oral   Take 2.5 mg by mouth at bedtime as needed (for muscle spasms).         . dicyclomine (BENTYL) 20 MG tablet   Oral   Take 20 mg by mouth 2 (two)  times daily as needed (for abdominal pain).          Marland Kitchen esomeprazole (NEXIUM) 40 MG capsule   Oral   Take 1 capsule (40 mg total) by mouth daily before breakfast.   90 capsule   1   . ezetimibe-simvastatin (VYTORIN) 10-40 MG per tablet   Oral   Take 1 tablet by mouth daily.         . fluoruracil (CARAC) 0.5 % cream   Topical   Apply 1 application topically 2 (two) times daily.         Marland Kitchen FLUoxetine (PROZAC) 40 MG capsule   Oral   Take 1 capsule (40 mg total) by mouth daily. NEEDS APPT FOR REFILLS   90 capsule   0   . hydrochlorothiazide (HYDRODIURIL) 25 MG tablet   Oral   Take 1 tablet (25 mg total) by mouth daily. NEEDS APPT FOR REFILLS   90 tablet   0   . HYDROcodone-acetaminophen (NORCO) 10-325 MG per tablet   Oral   Take 1 tablet by mouth 2 (two) times daily as needed for pain.         . metFORMIN (GLUCOPHAGE-XR) 500 MG 24 hr tablet   Oral   Take 1,000 mg by mouth daily. NEEDS APPT FOR  REFILLS         . traMADol (ULTRAM) 50 MG tablet   Oral   Take 50 mg by mouth 2 (two) times daily as needed for pain.          Marland Kitchen HYDROcodone-acetaminophen (NORCO/VICODIN) 5-325 MG per tablet   Oral   Take 1 tablet by mouth every 6 (six) hours as needed for pain.   15 tablet   0   . ondansetron (ZOFRAN ODT) 8 MG disintegrating tablet   Oral   Take 1 tablet (8 mg total) by mouth every 8 (eight) hours as needed for nausea.   20 tablet   0   . tamsulosin (FLOMAX) 0.4 MG CAPS capsule   Oral   Take 1 capsule (0.4 mg total) by mouth daily.   10 capsule   0    BP 132/77  Pulse 60  Temp(Src) 98.1 F (36.7 C) (Oral)  Resp 18  Ht 5\' 6"  (1.676 m)  Wt 187 lb (84.823 kg)  BMI 30.2 kg/m2  SpO2 100% Physical Exam  Vitals reviewed. Constitutional: She is oriented to person, place, and time. She appears well-developed and well-nourished.  HENT:  Head: Normocephalic and atraumatic.  Eyes: EOM are normal. Pupils are equal, round, and reactive to light.  Neck: Neck supple.  Cardiovascular: Normal rate, regular rhythm and normal heart sounds.   No murmur heard. Pulmonary/Chest: Effort normal. No respiratory distress.  Abdominal: Soft. She exhibits no distension. There is no tenderness. There is no rebound and no guarding.  Neurological: She is alert and oriented to person, place, and time.  Skin: Skin is warm and dry.    ED Course  Procedures (including critical care time) Labs Review Labs Reviewed  URINALYSIS, ROUTINE W REFLEX MICROSCOPIC - Abnormal; Notable for the following:    APPearance CLOUDY (*)    Specific Gravity, Urine 1.042 (*)    Glucose, UA >1000 (*)    Hgb urine dipstick SMALL (*)    Bilirubin Urine SMALL (*)    Ketones, ur 15 (*)    All other components within normal limits  URINE MICROSCOPIC-ADD ON - Abnormal; Notable for the following:    Squamous Epithelial / LPF MANY (*)  Crystals CA OXALATE CRYSTALS (*)    All other components within normal limits   GLUCOSE, CAPILLARY - Abnormal; Notable for the following:    Glucose-Capillary 290 (*)    All other components within normal limits  BASIC METABOLIC PANEL - Abnormal; Notable for the following:    Sodium 134 (*)    Potassium 3.4 (*)    Chloride 95 (*)    Glucose, Bld 235 (*)    All other components within normal limits  CBC WITH DIFFERENTIAL   Imaging Review US Renal  07/23/2013   *RADIOLOGY REPORT*  Clinical Data: History of flank pain more on the right than the left.  History of hematuria.  History of nephrolithiasis, diabetes, hypertension, and interstitial cystitis.  RENAL/URINARY TRACT ULTRASOUND COMPLETE  Comparison:  None.  Findings:  Right Kidney:  Right renal length is 11.3 cm peri  Left Kidney:  .  Left renal length is 12.7 cm.  Examination of each kidney shows no evidence of hydronephrosis, solid or cystic mass, calculus, parenchymal loss, or parenchymal textural abnormality.  Bladder:  Urinary bladder was empty at time of study.  IMPRESSION: No renal abnormalities are identified.   Original Report Authenticated By: Onalee Hua Call    MDM   1. Renal stones    Pt comes in with cc of renal stones. Pt has hx of renal stones and the current pain is similar to her previous pains. She has not required a surgery yet for the renal stones.  Pt is s/p several abd surgery and pelvic surgeries, but there is no signs of SBO. Based on the surgical hx (no ovaries, uterus, appendix, gall bladder) - and no risk factors for diverticulitis - i am suspecting that this is indeed just renal stones. We have gotten [place] - which shows no hydro. Pt and i discussed return precautions, and she has been given urology f/u.  Derwood Kaplan, MD 07/23/13 1947

## 2013-07-23 NOTE — ED Notes (Signed)
Pt c/o Right flank pain radiating to her Right side groin area and N/V x10 days

## 2013-09-05 ENCOUNTER — Other Ambulatory Visit: Payer: Self-pay

## 2014-03-03 ENCOUNTER — Ambulatory Visit (INDEPENDENT_AMBULATORY_CARE_PROVIDER_SITE_OTHER): Payer: Managed Care, Other (non HMO) | Admitting: Surgery

## 2014-03-03 ENCOUNTER — Encounter (INDEPENDENT_AMBULATORY_CARE_PROVIDER_SITE_OTHER): Payer: Self-pay | Admitting: Surgery

## 2014-03-03 VITALS — BP 130/80 | HR 76 | Temp 97.5°F | Resp 16 | Ht 66.0 in | Wt 222.6 lb

## 2014-03-03 DIAGNOSIS — R1031 Right lower quadrant pain: Secondary | ICD-10-CM

## 2014-03-03 DIAGNOSIS — G8929 Other chronic pain: Secondary | ICD-10-CM | POA: Insufficient documentation

## 2014-03-03 NOTE — Progress Notes (Signed)
Patient ID: [name], female   DOB: 1961/02/18, 53 y.o.   MRN: [id number]  Chief Complaint  Patient presents with  . eval abd pain    HPI [name] is a 53 y.o. female.  Pt presents at the request of Dr Janese Banks for 2 week hx of sharp right lower quadrant abdominal pain.  Came on sharp.  Pain with intercourse.  Pain with lifting.  Otherwise constant background sharp pain. No change in bowel function.  Has hysterectomy and 2 c sections.  Most pain around the RIGHT lower quadrant incision.  Had appendectomy in the past.  Has IC.  Followed by Dr Amalia Hailey.   HPI  Past Medical History  Diagnosis Date  . Interstitial cystitis   . GERD (gastroesophageal reflux disease)   . Hypertension   . DDD (degenerative disc disease)     s/p back surgeries and steroid injxns  . Diabetes mellitus   . Hyperlipidemia   . Obesity   . Depression   . Anxiety   . Barrett esophagus     Dr. Olevia Perches in GI  . Pneumonia   . Kidney stones     Past Surgical History  Procedure Laterality Date  . Neck surgery  12/2007  . Back surgery  12/2007  . Appendectomy    . Total abdominal hysterectomy    . Cholecystectomy    . Colonoscopy  2011  . Upper gi endoscopy      Family History  Problem Relation Age of Onset  . Hypertension Father   . Heart disease Mother     ischemic  . Allergies Sister   . Asthma Sister   . Gallbladder disease Mother   . Colon polyps Father   . Other Mother     rectovaginal fistula    Social History History  Substance Use Topics  . Smoking status: Former Smoker -- 0.30 packs/day for 17 years    Types: Cigarettes    Quit date: 11/18/2011  . Smokeless tobacco: Never Used  . Alcohol Use: Yes     Comment: rare    Allergies  Allergen Reactions  . Latex Anaphylaxis  . Penicillins Anaphylaxis  . Erythromycin Hives and Itching  . Ofloxacin Hives and Itching  . Sulfonamide Derivatives Hives and Itching    Current Outpatient Prescriptions  Medication Sig Dispense  Refill  . ACCU-CHEK AVIVA PLUS test strip TEST 3 TIMES DAILY  100 strip  5  . amLODipine (NORVASC) 5 MG tablet Take 5 mg by mouth daily.      . diazepam (VALIUM) 5 MG tablet Take 2.5 mg by mouth at bedtime as needed (for muscle spasms).      . dicyclomine (BENTYL) 20 MG tablet Take 20 mg by mouth 2 (two) times daily as needed (for abdominal pain).       Marland Kitchen esomeprazole (NEXIUM) 40 MG capsule Take 1 capsule (40 mg total) by mouth daily before breakfast.  90 capsule  1  . ezetimibe-simvastatin (VYTORIN) 10-40 MG per tablet Take 1 tablet by mouth daily.      . fluoruracil (CARAC) 0.5 % cream Apply 1 application topically 2 (two) times daily.      Marland Kitchen FLUoxetine (PROZAC) 40 MG capsule Take 1 capsule (40 mg total) by mouth daily. NEEDS APPT FOR REFILLS  90 capsule  0  . glipiZIDE (GLUCOTROL) 10 MG tablet       . hydrochlorothiazide (HYDRODIURIL) 25 MG tablet Take 1 tablet (25 mg total) by mouth daily. NEEDS APPT FOR  REFILLS  90 tablet  0  . HYDROcodone-acetaminophen (NORCO) 10-325 MG per tablet Take 1 tablet by mouth 2 (two) times daily as needed for pain.      Marland Kitchen HYDROcodone-acetaminophen (NORCO/VICODIN) 5-325 MG per tablet Take 1 tablet by mouth every 6 (six) hours as needed for pain.  15 tablet  0  . LORazepam (ATIVAN) 1 MG tablet       . metFORMIN (GLUCOPHAGE-XR) 500 MG 24 hr tablet Take 1,000 mg by mouth daily. NEEDS APPT FOR REFILLS      . pravastatin (PRAVACHOL) 20 MG tablet       . traMADol (ULTRAM) 50 MG tablet Take 50 mg by mouth 2 (two) times daily as needed for pain.        No current facility-administered medications for this visit.    Review of Systems Review of Systems  Constitutional: Positive for activity change.  HENT: Negative.   Eyes: Negative.   Respiratory: Negative.   Cardiovascular: Negative.   Gastrointestinal: Positive for abdominal pain.  Genitourinary: Positive for dyspareunia.  Musculoskeletal: Negative.   Hematological: Negative.     Blood pressure 130/80, pulse  76, temperature 97.5 F (36.4 C), resp. rate 16, height 5\' 6"  (1.676 m), weight 222 lb 9.6 oz (100.971 kg).  Physical Exam Physical Exam  Constitutional: She is oriented to person, place, and time. She appears well-developed and well-nourished.  HENT:  Head: Normocephalic and atraumatic.  Eyes: Pupils are equal, round, and reactive to light. No scleral icterus.  Neck: Normal range of motion. Neck supple.  Cardiovascular: Normal rate and regular rhythm.   Pulmonary/Chest: Effort normal and breath sounds normal.  Abdominal: Soft. She exhibits no distension and no ascites. No hernia.    Musculoskeletal: Normal range of motion.  Neurological: She is alert and oriented to person, place, and time.  Skin: Skin is warm and dry.  Psychiatric: She has a normal mood and affect. Her behavior is normal. Judgment and thought content normal.    Data Reviewed none  Assessment    Right lower quadrant abdominal pain for two weeks    Plan    CT scan to further evaluate pain.  Pain along previous scar and will need to exclude hernia.  Adhesion don't ususally cause the type of pain described by patient. Had a long discussion with patient about  expectations from  surgery for adhesions and results are mixed and unpredictable with pain resolution in 50 % of patients and half of those recurring in 6 months.   Risk of injury to internal organs and the need for open surgery ,  Organ resection,  Possible ostomy and other related procedures discussed. Will await results and further recommendations to follow.        Rachelanne Whidby A. Sharde Gover 03/03/2014, 3:25 PM

## 2014-03-03 NOTE — Patient Instructions (Signed)
Will arrange for CT scan to evaluate pain.  Once done,  Can better determine next step.

## 2014-03-06 ENCOUNTER — Ambulatory Visit
Admission: RE | Admit: 2014-03-06 | Discharge: 2014-03-06 | Disposition: A | Discharge status: Home / Self Care | Payer: Managed Care, Other (non HMO) | Source: Ambulatory Visit | Attending: Surgery | Admitting: Surgery

## 2014-03-06 DIAGNOSIS — G8929 Other chronic pain: Secondary | ICD-10-CM

## 2014-03-06 DIAGNOSIS — R1031 Right lower quadrant pain: Principal | ICD-10-CM

## 2014-03-06 MED ORDER — IOHEXOL 300 MG/ML  SOLN
125.0000 mL | Freq: Once | INTRAMUSCULAR | Status: AC | PRN
  Administered 2014-03-06: 125 mL via INTRAVENOUS

## 2014-03-07 ENCOUNTER — Telehealth (INDEPENDENT_AMBULATORY_CARE_PROVIDER_SITE_OTHER): Payer: Self-pay

## 2014-03-07 NOTE — Telephone Encounter (Signed)
Message copied by Carlene Coria on Fri Mar 07, 2014  1:52 PM ------      Message from: Erroll Luna A      Created: Fri Mar 07, 2014  9:33 AM       CT normal.  Only option is laparoscopy to look around but not sure this will help.  Only about a 50 % chance this will help.  If she wants to proceed with this she can come in or we can schedule without visit. ------

## 2014-03-07 NOTE — Telephone Encounter (Signed)
Called pt with CT results. She would like to proceed with surgery. Does not wish to come back in. Will get orders next week and get her scheduled.

## 2014-03-10 ENCOUNTER — Ambulatory Visit (INDEPENDENT_AMBULATORY_CARE_PROVIDER_SITE_OTHER): Payer: Managed Care, Other (non HMO) | Admitting: Surgery

## 2014-03-10 ENCOUNTER — Other Ambulatory Visit (INDEPENDENT_AMBULATORY_CARE_PROVIDER_SITE_OTHER): Payer: Self-pay | Admitting: Surgery

## 2014-03-10 NOTE — Progress Notes (Signed)
Pt CT normal.  No hernia or other cause of pain.  Discussed laparoscopy as a possible next step but benefit less than 50 % at helping.  She would like to proceed knowing this.  The procedure has been discussed with the patient.  Alternative therapies have been discussed with the patient.  Operative risks include bleeding,  Infection,  Organ injury,  Nerve injury,  Blood vessel injury,  DVT,  Pulmonary embolism,  Death,  And possible reoperation.  Medical management risks include worsening of present situation.  The success of the procedure is 50  at treating patients symptoms.  The patient understands and agrees to proceed.

## 2014-03-28 ENCOUNTER — Encounter: Payer: Self-pay | Admitting: *Deleted

## 2014-03-28 ENCOUNTER — Encounter: Payer: Managed Care, Other (non HMO) | Attending: Family Medicine | Admitting: *Deleted

## 2014-03-28 VITALS — Ht 66.0 in | Wt 224.0 lb

## 2014-03-28 DIAGNOSIS — R110 Nausea: Secondary | ICD-10-CM | POA: Insufficient documentation

## 2014-03-28 DIAGNOSIS — E119 Type 2 diabetes mellitus without complications: Secondary | ICD-10-CM | POA: Insufficient documentation

## 2014-03-28 DIAGNOSIS — Z713 Dietary counseling and surveillance: Secondary | ICD-10-CM | POA: Insufficient documentation

## 2014-03-28 NOTE — Progress Notes (Signed)
Appt start time: 0930 end time:  1100.  Assessment:  Patient was seen on  03/28/14 for individual diabetes education. Patient states she has had diabetes for about 10 years. Has major back problems and stomach problems. She had problem with excessive nausea with metformin and more recently low BG with falling when taking Glipizide. 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 She SMBG with reported BG between 70 and 275 with wide variations.   Current HbA1c: 8.6%  Preferred Learning Style:   No preference indicated   Learning Readiness:   Contemplating  Ready  Change in progress  MEDICATIONS: see list, current diabetes medication is Glipizide  DIETARY INTAKE:  24-hr recall:  B ( AM): skips due to nausea OR could be whatever she feels she can keep down OR unsweet cereal OR yogurt OR  Wheat English Muffin OR egg with wheat toast and banana, milk or water occasionally fruit juice Snk ( AM): baked chips or yogurt  L ( PM): organic lean meat with fresh fruit and a vegetable, water or diet Mountain Dew Snk ( PM): popcorn D ( PM): skips often, she fixes for her husband lean meat, small starch, vegetables, fruit, but she usually doesn't eat supper Snk ( PM): infrequently, baked chips or yogurt Beverages: water, diet Mountain Dew  Usual physical activity: walks 3 miles about 5 days a week  Estimated energy needs: 1400 calories 158 g carbohydrates 105 g protein 39 g fat  Progress Towards Goal(s):  Some progress.   Nutritional Diagnosis:  NB-1.1 Food and nutrition-related knowledge deficit As related to diabetes control.  As evidenced by A1c of 8.6%.    Intervention:  Nutrition counseling provided.  Discussed diabetes disease process and treatment options.  Discussed physiology of diabetes and role of obesity on insulin resistance.  Encouraged moderate weight reduction to improve glucose levels.  Discussed  role of medications and diet in glucose control  Reviewed patient medications.  Discussed role of medication on blood glucose and possible side effects. Also explained insulin action of various insulins and their lack of side effects that she has been having with several diabetes medications already tried.  Plan to discuss at next visit education on macronutrients on glucose levels.  Plan to provide education on carb counting, importance of regularly scheduled meals/snacks, and meal planning. Eating habits are fairly appropriate already.  Discussed effects of physical activity on glucose levels and long-term glucose control.  Recommended she continue her current amount of physical activity/week.  Plan to discuss blood glucose monitoring and interpretation and recommended target ranges and individual ranges at next visit.    Plan to discuss short-term complications: hyper- and hypo-glycemia.  Discussed causes,symptoms, and treatment options.  Plan to discuss prevention, detection, and treatment of long-term complications.  Discussed the role of prolonged elevated glucose levels on body systems.  Plan to discuss role of stress on blood glucose levels and discussed strategies to manage psychosocial issues.  Plan to discuss recommendations for long-term diabetes self-care.  Established checklist for medical, dental, and emotional self-care.  Plan:  Continue eating carb containing foods in moderation Continue with walking 5 days a week as tolerated Continue SMBG including pre and post meals Consider talking with MD about insulin rather than the medications that you have been having side effects with.  Teaching Method Utilized: Visual and  Auditory  Handouts given during visit include: Living Well with Diabetes Carb Counting  and Food Label handouts Meal Plan Card Insulin Action Handout  Diabetes medication handout  Barriers to learning/adherence to lifestyle change: multiple side effects  from several diabetes medications   Diabetes self-care support plan:   [facility] support group  Demonstrated degree of understanding via:  Teach Back   Monitoring/Evaluation:  Dietary intake, exercise, SMBG, and body weight in 6 week(s).

## 2014-03-31 ENCOUNTER — Encounter: Payer: Self-pay | Admitting: Internal Medicine

## 2014-04-01 NOTE — Patient Instructions (Signed)
Plan:  Continue eating carb containing foods in moderation Continue with walking 5 days a week as tolerated Continue SMBG including pre and post meals Consider talking with MD about insulin rather than the medications that you have been having side effects with.

## 2014-04-07 ENCOUNTER — Encounter (HOSPITAL_COMMUNITY): Payer: Self-pay | Admitting: Pharmacy Technician

## 2014-04-07 NOTE — Pre-Procedure Instructions (Signed)
MERIDIAN SCHERGER  04/07/2014   Your procedure is scheduled on:  Tues, June 16 @ 7:30 AM  Report to [facility]  at 5:30 AM.  Call this number if you have problems the morning of surgery: [phone]   Remember:   Do not eat food or drink liquids after midnight.   Take these medicines the morning of surgery with A SIP OF WATER: Amlodipine(Norvasc),Bentyl(Dicyclomine),Nexium(Esomeprazole),Prozac(Fluoxetine),and Pain Pill(if needed)              No Goody's,BC's,Aleve,Aspirin,Ibuprofen,Fish Oil,or any Herbal Medications   Do not wear jewelry, make-up or nail polish.  Do not wear lotions, powders, or perfumes. You may wear deodorant.  Do not shave 48 hours prior to surgery.   Do not bring valuables to the hospital.  [facility] is not responsible                  for any belongings or valuables.               Contacts, dentures or bridgework may not be worn into surgery.  Leave suitcase in the car. After surgery it may be brought to your room.  For patients admitted to the hospital, discharge time is determined by your                treatment team.                   Special Instructions:  [facility] - Preparing for Surgery  Before surgery, you can play an important role.  Because skin is not sterile, your skin needs to be as free of germs as possible.  You can reduce the number of germs on you skin by washing with CHG (chlorahexidine gluconate) soap before surgery.  CHG is an antiseptic cleaner which kills germs and bonds with the skin to continue killing germs even after washing.  Please DO NOT use if you have an allergy to CHG or antibacterial soaps.  If your skin becomes reddened/irritated stop using the CHG and inform your nurse when you arrive at Short Stay.  Do not shave (including legs and underarms) for at least 48 hours prior to the first CHG shower.  You may shave your face.  Please follow these instructions carefully:   1.  Shower with CHG Soap the night before  surgery and the                                morning of Surgery.  2.  If you choose to wash your hair, wash your hair first as usual with your       normal shampoo.  3.  After you shampoo, rinse your hair and body thoroughly to remove the                      Shampoo.  4.  Use CHG as you would any other liquid soap.  You can apply chg directly       to the skin and wash gently with scrungie or a clean washcloth.  5.  Apply the CHG Soap to your body ONLY FROM THE NECK DOWN.        Do not use on open wounds or open sores.  Avoid contact with your eyes,       ears, mouth and genitals (private parts).  Wash genitals (private parts)       with  your normal soap.  6.  Wash thoroughly, paying special attention to the area where your surgery        will be performed.  7.  Thoroughly rinse your body with warm water from the neck down.  8.  DO NOT shower/wash with your normal soap after using and rinsing off       the CHG Soap.  9.  Pat yourself dry with a clean towel.            10.  Wear clean pajamas.            11.  Place clean sheets on your bed the night of your first shower and do not        sleep with pets.  Day of Surgery  Do not apply any lotions/deoderants the morning of surgery.  Please wear clean clothes to the hospital/surgery center.     Please read over the following fact sheets that you were given: Pain Booklet, Coughing and Deep Breathing and Surgical Site Infection Prevention

## 2014-04-08 ENCOUNTER — Encounter (HOSPITAL_COMMUNITY): Payer: Self-pay

## 2014-04-08 ENCOUNTER — Encounter (HOSPITAL_COMMUNITY)
Admission: RE | Admit: 2014-04-08 | Discharge: 2014-04-08 | Disposition: A | Discharge status: Home / Self Care | Payer: Managed Care, Other (non HMO) | Source: Ambulatory Visit | Attending: Surgery | Admitting: Surgery

## 2014-04-08 ENCOUNTER — Ambulatory Visit (HOSPITAL_COMMUNITY)
Admission: RE | Admit: 2014-04-08 | Discharge: 2014-04-08 | Disposition: A | Discharge status: Home / Self Care | Payer: Managed Care, Other (non HMO) | Source: Ambulatory Visit | Attending: Anesthesiology | Admitting: Anesthesiology

## 2014-04-08 DIAGNOSIS — I10 Essential (primary) hypertension: Secondary | ICD-10-CM | POA: Insufficient documentation

## 2014-04-08 DIAGNOSIS — Z01810 Encounter for preprocedural cardiovascular examination: Secondary | ICD-10-CM | POA: Insufficient documentation

## 2014-04-08 DIAGNOSIS — Z01812 Encounter for preprocedural laboratory examination: Secondary | ICD-10-CM | POA: Insufficient documentation

## 2014-04-08 DIAGNOSIS — E119 Type 2 diabetes mellitus without complications: Secondary | ICD-10-CM | POA: Insufficient documentation

## 2014-04-08 DIAGNOSIS — Z01818 Encounter for other preprocedural examination: Secondary | ICD-10-CM | POA: Insufficient documentation

## 2014-04-08 DIAGNOSIS — J9819 Other pulmonary collapse: Secondary | ICD-10-CM | POA: Insufficient documentation

## 2014-04-08 HISTORY — DX: Other muscle spasm: M62.838

## 2014-04-08 HISTORY — DX: Pain in unspecified joint: M25.50

## 2014-04-08 HISTORY — DX: Other reaction to spinal and lumbar puncture: G97.1

## 2014-04-08 HISTORY — DX: Dorsalgia, unspecified: M54.9

## 2014-04-08 HISTORY — DX: Other specified postprocedural states: Z98.890

## 2014-04-08 HISTORY — DX: Anesthesia of skin: R20.0

## 2014-04-08 HISTORY — DX: Frequency of micturition: R35.0

## 2014-04-08 HISTORY — DX: Other chronic pain: G89.29

## 2014-04-08 HISTORY — DX: Effusion, unspecified joint: M25.40

## 2014-04-08 HISTORY — DX: Nausea with vomiting, unspecified: R11.2

## 2014-04-08 HISTORY — DX: Personal history of urinary calculi: Z87.442

## 2014-04-08 LAB — CBC WITH DIFFERENTIAL/PLATELET
Basophils Absolute: 0.1 10*3/uL (ref 0.0–0.1)
Basophils Relative: 1 % (ref 0–1)
EOS ABS: 0.2 10*3/uL (ref 0.0–0.7)
Eosinophils Relative: 2 % (ref 0–5)
HCT: 41.4 % (ref 36.0–46.0)
Hemoglobin: 14.5 g/dL (ref 12.0–15.0)
Lymphocytes Relative: 30 % (ref 12–46)
Lymphs Abs: 2.9 10*3/uL (ref 0.7–4.0)
MCH: 31.7 pg (ref 26.0–34.0)
MCHC: 35 g/dL (ref 30.0–36.0)
MCV: 90.4 fL (ref 78.0–100.0)
Monocytes Absolute: 0.5 10*3/uL (ref 0.1–1.0)
Monocytes Relative: 5 % (ref 3–12)
NEUTROS ABS: 6 10*3/uL (ref 1.7–7.7)
Neutrophils Relative %: 62 % (ref 43–77)
Platelets: 350 10*3/uL (ref 150–400)
RBC: 4.58 MIL/uL (ref 3.87–5.11)
RDW: 12.5 % (ref 11.5–15.5)
WBC: 9.7 10*3/uL (ref 4.0–10.5)

## 2014-04-08 LAB — COMPREHENSIVE METABOLIC PANEL
ALT: 32 U/L (ref 0–35)
AST: 28 U/L (ref 0–37)
Albumin: 4.5 g/dL (ref 3.5–5.2)
Alkaline Phosphatase: 71 U/L (ref 39–117)
BILIRUBIN TOTAL: 0.5 mg/dL (ref 0.3–1.2)
BUN: 13 mg/dL (ref 6–23)
CHLORIDE: 95 meq/L — AB (ref 96–112)
CO2: 29 mEq/L (ref 19–32)
Calcium: 10.4 mg/dL (ref 8.4–10.5)
Creatinine, Ser: 0.75 mg/dL (ref 0.50–1.10)
GFR calc non Af Amer: >90 mL/min (ref >90)
GLUCOSE: 208 mg/dL — AB (ref 70–99)
POTASSIUM: 3.4 meq/L — AB (ref 3.7–5.3)
Sodium: 139 mEq/L (ref 137–147)
TOTAL PROTEIN: 7.6 g/dL (ref 6.0–8.3)

## 2014-04-08 NOTE — Progress Notes (Addendum)
Pt doesn't have a cardiologist  Pt states stress test and echo were done at [facility] around 2013 when heart cath was done but not seeing reports in epic     Heart cath in epic from 2013  Medical Md is Dr.Al Lenna Gilford   Denies EKG or CXR in past yr

## 2014-04-09 ENCOUNTER — Encounter (HOSPITAL_COMMUNITY): Payer: Self-pay

## 2014-04-09 NOTE — Progress Notes (Addendum)
Anesthesia Chart Review:  Patient is a 53 year old female scheduled for diagnostic laparoscopy on 04/15/14 by Dr. Brantley Stage. Chart was left for me to review on 04/09/14.  History includes former smoker, post-operative N/V, spinal headaches, interstitial cystitis, HLD, depression, anxiety, Barrett's esophagus, HTN, DM2, fainting reportedly due to new DM medications (hypoglycemic event?), GERD, nephrolithiasis, lumbar fusion '06, neck surgery '09, hysterectomy.  BMI is consistent with obesity. PCP is Dr. Franchot Erichsen. [place].  Medications: canagliflozin, fluoxetine, Norco, lorazepam, amlodipine, azithromycin, dicyclomine, esomeprazole, glipizide, HCTZ, pravastatin, tramadol.   EKG on 04/08/14 showed: SR with frequent and consecutive PVC and ventricular escape complexes, inferior infarct (age undetermined), anteroseptal infarct (age undetermined), prolonged QT (which was interpreted as a critical test result--long QTc; 579 ms) by cardiologist Dr. Golden Hurter. Prolonged QTc and ventricular ectopy was new since 02/02/12. Interestingly, she told her PAT RN that her last dose of azithromycin would be 04/08/14 (prescribed for sinus infection), so this may be the culprit of her prolonged QT. She is also mildly hypokalemic.   On 02/01/12, she had a cardiac cath (for evaluation of nitrate responsive chest pain in the setting of multiple CAD risk factors) that showed normal LVF and normal coronaries.   CXR on 04/08/14 showed:  1. Mild bibasilar subsegmental atelectasis.  2. No acute cardiopulmonary disease otherwise noted.  Preoperative labs noted.  She will get a fasting CBG on arrival.    Due to her critical EKG findings, I called and spoke with on-call cardiologist Dr. Sallyanne Kuster earlier today. He agreed that the azithromycin and hypokalemia could both be contributing to her abnormal EKG.  If her fainting spell did occur over the past few days (during the course of her antibiotics) then he would recommend urgent referral;  otherwise he felt the best course of action was to replace her potassium, stop the antibiotic, and repeat her EKG within the next few days (around 04/11/14) to ensure QT is normalizing.  I have left a voice message for patient to call me earlier today.  By 3:30 PM I had spoken with patient and [place] Malta at Dr. Lenna Gilford' office about patient's EKG and cardiology recommendations.  [facility] Malta will review with Dr. Milly Jakob or his physician assistant regarding calling her in a KCl supplement.  I also faxed my incomplete note along with her labs and last two EKGs.  Patient said her syncope/presyncope episode was < 1 month ago, but prior to being on the antibiotics. At that time her glucose dropped from 387 to 91 in 30 minutes, and she felt her typical hypoglycemic symptoms.  There was no associated chest pain or palpitations.  She has since had an adjustment in her DM regimen following an evaluation with Dr. Jerrye Bushy physician assistant.  Patient will come back in to PAT on 04/11/14 for a repeat EKG and ISTAT.  I asked Dr. Jerrye Bushy office to let patient know if they had any additional recommendations in the interim.    I'll plan to follow-up with patient and her EKG/lab results on 04/11/14.  George Hugh [facility] Short Stay Center/Anesthesiology Phone [phone] 04/09/2014 6:44 PM  Addendum: 04/11/2014 2:00 PM Patient called me this morning from Dr. Jerrye Bushy office.  Apparently, after they received the information that I faxed they wanted her to go to their office for evaluation and for repeat EKG and labs.  Her EKG this morning showed SR with frequent premature complexes (morphology seems most consistent with premature supraventricular complexes), prolonged QT. Her BMET results from his office is still pending.  I was told Dr. Jerrye Bushy office was closing at 67 noon today, but someone from their office would be notified of her BMET results later today and could fax [place] a report by 04/14/14.  I am out of the office all next  week, so anesthesiologist Dr. Linna Caprice is planning to follow-up with Dr. Milly Jakob or his physician assistant Vinnie Level [phone]) on 04/14/14 to determine if further evaluation is planned prior to her surgery scheduled for 04/15/14.

## 2014-04-11 ENCOUNTER — Inpatient Hospital Stay (HOSPITAL_COMMUNITY): Admission: RE | Admit: 2014-04-11 | Payer: Managed Care, Other (non HMO) | Source: Ambulatory Visit

## 2014-04-14 MED ORDER — CLINDAMYCIN PHOSPHATE 300 MG/50ML IV SOLN
300.0000 mg | Freq: Once | INTRAVENOUS | Status: AC
  Administered 2014-04-15: 300 mg via INTRAVENOUS
  Filled 2014-04-14: qty 50

## 2014-04-15 ENCOUNTER — Ambulatory Visit (HOSPITAL_COMMUNITY): Payer: Managed Care, Other (non HMO) | Admitting: Anesthesiology

## 2014-04-15 ENCOUNTER — Encounter (HOSPITAL_COMMUNITY): Payer: Self-pay | Admitting: *Deleted

## 2014-04-15 ENCOUNTER — Encounter (HOSPITAL_COMMUNITY)
Admission: RE | Disposition: A | Discharge status: Home / Self Care | Payer: Self-pay | Source: Ambulatory Visit | Attending: Surgery

## 2014-04-15 ENCOUNTER — Ambulatory Visit (HOSPITAL_COMMUNITY)
Admission: RE | Admit: 2014-04-15 | Discharge: 2014-04-15 | Disposition: A | Discharge status: Home / Self Care | Payer: Managed Care, Other (non HMO) | Source: Ambulatory Visit | Attending: Surgery | Admitting: Surgery

## 2014-04-15 ENCOUNTER — Encounter (HOSPITAL_COMMUNITY): Payer: Managed Care, Other (non HMO) | Admitting: Vascular Surgery

## 2014-04-15 DIAGNOSIS — K660 Peritoneal adhesions (postprocedural) (postinfection): Secondary | ICD-10-CM | POA: Insufficient documentation

## 2014-04-15 DIAGNOSIS — F3289 Other specified depressive episodes: Secondary | ICD-10-CM

## 2014-04-15 DIAGNOSIS — K2270 Barrett's esophagus without dysplasia: Secondary | ICD-10-CM

## 2014-04-15 DIAGNOSIS — N949 Unspecified condition associated with female genital organs and menstrual cycle: Secondary | ICD-10-CM | POA: Insufficient documentation

## 2014-04-15 DIAGNOSIS — E785 Hyperlipidemia, unspecified: Secondary | ICD-10-CM

## 2014-04-15 DIAGNOSIS — D72829 Elevated white blood cell count, unspecified: Secondary | ICD-10-CM

## 2014-04-15 DIAGNOSIS — R1031 Right lower quadrant pain: Secondary | ICD-10-CM

## 2014-04-15 DIAGNOSIS — F411 Generalized anxiety disorder: Secondary | ICD-10-CM | POA: Insufficient documentation

## 2014-04-15 DIAGNOSIS — K7689 Other specified diseases of liver: Secondary | ICD-10-CM

## 2014-04-15 DIAGNOSIS — F17200 Nicotine dependence, unspecified, uncomplicated: Secondary | ICD-10-CM

## 2014-04-15 DIAGNOSIS — IMO0002 Reserved for concepts with insufficient information to code with codable children: Secondary | ICD-10-CM

## 2014-04-15 DIAGNOSIS — E669 Obesity, unspecified: Secondary | ICD-10-CM

## 2014-04-15 DIAGNOSIS — K219 Gastro-esophageal reflux disease without esophagitis: Secondary | ICD-10-CM | POA: Insufficient documentation

## 2014-04-15 DIAGNOSIS — Z87891 Personal history of nicotine dependence: Secondary | ICD-10-CM | POA: Insufficient documentation

## 2014-04-15 DIAGNOSIS — G8929 Other chronic pain: Secondary | ICD-10-CM

## 2014-04-15 DIAGNOSIS — E119 Type 2 diabetes mellitus without complications: Secondary | ICD-10-CM

## 2014-04-15 DIAGNOSIS — F329 Major depressive disorder, single episode, unspecified: Secondary | ICD-10-CM | POA: Insufficient documentation

## 2014-04-15 DIAGNOSIS — N3010 Interstitial cystitis (chronic) without hematuria: Secondary | ICD-10-CM

## 2014-04-15 DIAGNOSIS — I10 Essential (primary) hypertension: Secondary | ICD-10-CM

## 2014-04-15 HISTORY — PX: LAPAROSCOPIC LYSIS OF ADHESIONS: SHX5905

## 2014-04-15 HISTORY — PX: LAPAROSCOPY: SHX197

## 2014-04-15 LAB — POCT I-STAT 4, (NA,K, GLUC, HGB,HCT)
GLUCOSE: 172 mg/dL — AB (ref 70–99)
HEMATOCRIT: 39 % (ref 36.0–46.0)
HEMOGLOBIN: 13.3 g/dL (ref 12.0–15.0)
POTASSIUM: 2.7 meq/L — AB (ref 3.7–5.3)
Sodium: 140 mEq/L (ref 137–147)

## 2014-04-15 LAB — GLUCOSE, CAPILLARY
GLUCOSE-CAPILLARY: 168 mg/dL — AB (ref 70–99)
GLUCOSE-CAPILLARY: 166 mg/dL — AB (ref 70–99)

## 2014-04-15 SURGERY — LAPAROSCOPY, DIAGNOSTIC
Anesthesia: General | Site: Abdomen

## 2014-04-15 MED ORDER — PROPOFOL 10 MG/ML IV BOLUS
INTRAVENOUS | Status: AC
  Filled 2014-04-15: qty 20

## 2014-04-15 MED ORDER — OXYCODONE HCL 5 MG PO TABS
5.0000 mg | ORAL_TABLET | Freq: Once | ORAL | Status: AC | PRN
  Administered 2014-04-15: 5 mg via ORAL

## 2014-04-15 MED ORDER — ROCURONIUM BROMIDE 50 MG/5ML IV SOLN
INTRAVENOUS | Status: AC
  Filled 2014-04-15: qty 1

## 2014-04-15 MED ORDER — MIDAZOLAM HCL 5 MG/5ML IJ SOLN
INTRAMUSCULAR | Status: DC | PRN
  Administered 2014-04-15: 2 mg via INTRAVENOUS

## 2014-04-15 MED ORDER — NEOSTIGMINE METHYLSULFATE 10 MG/10ML IV SOLN
INTRAVENOUS | Status: AC
Start: 2014-04-15 — End: 2014-04-15
  Filled 2014-04-15: qty 1

## 2014-04-15 MED ORDER — POTASSIUM CHLORIDE ER 10 MEQ PO TBCR: 10.0000 meq | EXTENDED_RELEASE_TABLET | Freq: Every day | ORAL | Status: DC

## 2014-04-15 MED ORDER — ONDANSETRON HCL 4 MG/2ML IJ SOLN
INTRAMUSCULAR | Status: DC | PRN
  Administered 2014-04-15: 4 mg via INTRAVENOUS

## 2014-04-15 MED ORDER — SUCCINYLCHOLINE CHLORIDE 20 MG/ML IJ SOLN
INTRAMUSCULAR | Status: AC
  Filled 2014-04-15: qty 1

## 2014-04-15 MED ORDER — OXYCODONE HCL 5 MG PO TABS
ORAL_TABLET | ORAL | Status: AC
  Filled 2014-04-15: qty 1

## 2014-04-15 MED ORDER — 0.9 % SODIUM CHLORIDE (POUR BTL) OPTIME
TOPICAL | Status: DC | PRN
  Administered 2014-04-15 (×2): 1000 mL

## 2014-04-15 MED ORDER — SODIUM CHLORIDE 0.9 % IJ SOLN
INTRAMUSCULAR | Status: AC
  Filled 2014-04-15: qty 10

## 2014-04-15 MED ORDER — FENTANYL CITRATE 0.05 MG/ML IJ SOLN
INTRAMUSCULAR | Status: AC
  Filled 2014-04-15: qty 5

## 2014-04-15 MED ORDER — HYDROMORPHONE HCL PF 1 MG/ML IJ SOLN: 0.2500 mg | INTRAMUSCULAR | Status: DC | PRN

## 2014-04-15 MED ORDER — LACTATED RINGERS IV SOLN
INTRAVENOUS | Status: DC | PRN
  Administered 2014-04-15: 07:00:00 via INTRAVENOUS

## 2014-04-15 MED ORDER — MIDAZOLAM HCL 2 MG/2ML IJ SOLN
INTRAMUSCULAR | Status: AC
  Filled 2014-04-15: qty 2

## 2014-04-15 MED ORDER — GLYCOPYRROLATE 0.2 MG/ML IJ SOLN
INTRAMUSCULAR | Status: AC
  Filled 2014-04-15: qty 4

## 2014-04-15 MED ORDER — EPHEDRINE SULFATE 50 MG/ML IJ SOLN
INTRAMUSCULAR | Status: DC | PRN
  Administered 2014-04-15: 5 mg via INTRAVENOUS
  Administered 2014-04-15 (×2): 10 mg via INTRAVENOUS

## 2014-04-15 MED ORDER — EPHEDRINE SULFATE 50 MG/ML IJ SOLN
INTRAMUSCULAR | Status: AC
  Filled 2014-04-15: qty 1

## 2014-04-15 MED ORDER — ROCURONIUM BROMIDE 100 MG/10ML IV SOLN
INTRAVENOUS | Status: DC | PRN
  Administered 2014-04-15: 40 mg via INTRAVENOUS

## 2014-04-15 MED ORDER — LIDOCAINE HCL (CARDIAC) 20 MG/ML IV SOLN
INTRAVENOUS | Status: DC | PRN
  Administered 2014-04-15: 50 mg via INTRAVENOUS

## 2014-04-15 MED ORDER — PROPOFOL 10 MG/ML IV BOLUS
INTRAVENOUS | Status: DC | PRN
  Administered 2014-04-15: 140 mg via INTRAVENOUS

## 2014-04-15 MED ORDER — BUPIVACAINE-EPINEPHRINE (PF) 0.25% -1:200000 IJ SOLN
INTRAMUSCULAR | Status: AC
  Filled 2014-04-15: qty 30

## 2014-04-15 MED ORDER — FENTANYL CITRATE 0.05 MG/ML IJ SOLN
INTRAMUSCULAR | Status: DC | PRN
  Administered 2014-04-15 (×3): 50 ug via INTRAVENOUS

## 2014-04-15 MED ORDER — ONDANSETRON HCL 4 MG/2ML IJ SOLN: 4.0000 mg | Freq: Once | INTRAMUSCULAR | Status: DC | PRN

## 2014-04-15 MED ORDER — BUPIVACAINE-EPINEPHRINE 0.25% -1:200000 IJ SOLN
INTRAMUSCULAR | Status: DC | PRN
  Administered 2014-04-15: 4 mL

## 2014-04-15 MED ORDER — OXYCODONE-ACETAMINOPHEN 5-325 MG PO TABS: 1.0000 | ORAL_TABLET | ORAL | Status: DC | PRN

## 2014-04-15 MED ORDER — NEOSTIGMINE METHYLSULFATE 10 MG/10ML IV SOLN
INTRAVENOUS | Status: DC | PRN
  Administered 2014-04-15: 4 mg via INTRAVENOUS

## 2014-04-15 MED ORDER — OXYCODONE HCL 5 MG/5ML PO SOLN: 5.0000 mg | Freq: Once | ORAL | Status: AC | PRN

## 2014-04-15 MED ORDER — LIDOCAINE HCL (CARDIAC) 20 MG/ML IV SOLN
INTRAVENOUS | Status: AC
  Filled 2014-04-15: qty 5

## 2014-04-15 MED ORDER — GLYCOPYRROLATE 0.2 MG/ML IJ SOLN
INTRAMUSCULAR | Status: DC | PRN
  Administered 2014-04-15: 0.6 mg via INTRAVENOUS
  Administered 2014-04-15: 0.2 mg via INTRAVENOUS

## 2014-04-15 SURGICAL SUPPLY — 46 items
ADH SKN CLS APL DERMABOND .7 (GAUZE/BANDAGES/DRESSINGS) ×1
APPLICATOR COTTON TIP 6IN STRL (MISCELLANEOUS) ×2 IMPLANT
BLADE SURG ROTATE 9660 (MISCELLANEOUS) IMPLANT
CANISTER SUCTION 2500CC (MISCELLANEOUS) IMPLANT
CHLORAPREP W/TINT 26ML (MISCELLANEOUS) ×3 IMPLANT
COVER SURGICAL LIGHT HANDLE (MISCELLANEOUS) ×3 IMPLANT
DECANTER SPIKE VIAL GLASS SM (MISCELLANEOUS) ×1 IMPLANT
DERMABOND ADVANCED (GAUZE/BANDAGES/DRESSINGS) ×2
DERMABOND ADVANCED .7 DNX12 (GAUZE/BANDAGES/DRESSINGS) ×1 IMPLANT
DRAPE UTILITY 15X26 W/TAPE STR (DRAPE) ×6 IMPLANT
DRAPE WARM FLUID 44X44 (DRAPE) ×3 IMPLANT
ELECT REM PT RETURN 9FT ADLT (ELECTROSURGICAL) ×3
ELECTRODE REM PT RTRN 9FT ADLT (ELECTROSURGICAL) ×1 IMPLANT
GLOVE BIO SURGEON STRL SZ8 (GLOVE) ×1 IMPLANT
GLOVE BIOGEL PI IND STRL 6.5 (GLOVE) IMPLANT
GLOVE BIOGEL PI IND STRL 7.0 (GLOVE) IMPLANT
GLOVE BIOGEL PI IND STRL 7.5 (GLOVE) IMPLANT
GLOVE BIOGEL PI IND STRL 8 (GLOVE) ×1 IMPLANT
GLOVE BIOGEL PI INDICATOR 6.5 (GLOVE) ×6
GLOVE BIOGEL PI INDICATOR 7.0 (GLOVE) ×2
GLOVE BIOGEL PI INDICATOR 7.5 (GLOVE) ×2
GLOVE BIOGEL PI INDICATOR 8 (GLOVE) ×2
GLOVE SURG SS PI 6.5 STRL IVOR (GLOVE) ×2 IMPLANT
GLOVE SURG SS PI 7.0 STRL IVOR (GLOVE) ×2 IMPLANT
GLOVE SURG SS PI 7.5 STRL IVOR (GLOVE) ×2 IMPLANT
GLOVE SURG SS PI 8.0 STRL IVOR (GLOVE) ×2 IMPLANT
GOWN STRL REUS W/ TWL LRG LVL3 (GOWN DISPOSABLE) ×3 IMPLANT
GOWN STRL REUS W/ TWL XL LVL3 (GOWN DISPOSABLE) ×1 IMPLANT
GOWN STRL REUS W/TWL LRG LVL3 (GOWN DISPOSABLE) ×12
GOWN STRL REUS W/TWL XL LVL3 (GOWN DISPOSABLE) ×3
KIT BASIN OR (CUSTOM PROCEDURE TRAY) ×3 IMPLANT
KIT ROOM TURNOVER OR (KITS) ×6 IMPLANT
NS IRRIG 1000ML POUR BTL (IV SOLUTION) ×6 IMPLANT
PAD ARMBOARD 7.5X6 YLW CONV (MISCELLANEOUS) ×3 IMPLANT
PAD DEFIB R2 (MISCELLANEOUS) ×2 IMPLANT
SCISSORS LAP 5X35 DISP (ENDOMECHANICALS) IMPLANT
SET IRRIG TUBING LAPAROSCOPIC (IRRIGATION / IRRIGATOR) IMPLANT
SLEEVE ENDOPATH XCEL 5M (ENDOMECHANICALS) ×5 IMPLANT
SUT MNCRL AB 4-0 PS2 18 (SUTURE) ×3 IMPLANT
TOWEL OR 17X24 6PK STRL BLUE (TOWEL DISPOSABLE) ×2 IMPLANT
TOWEL OR 17X26 10 PK STRL BLUE (TOWEL DISPOSABLE) ×3 IMPLANT
TOWEL OR NON WOVEN STRL DISP B (DISPOSABLE) ×4 IMPLANT
TRAY LAPAROSCOPIC (CUSTOM PROCEDURE TRAY) ×3 IMPLANT
TROCAR XCEL NON-BLD 11X100MML (ENDOMECHANICALS) IMPLANT
TROCAR XCEL NON-BLD 5MMX100MML (ENDOMECHANICALS) ×3 IMPLANT
WATER STERILE IRR 1000ML POUR (IV SOLUTION) IMPLANT

## 2014-04-15 NOTE — Anesthesia Postprocedure Evaluation (Signed)
  Anesthesia Post-op Note  Patient: [name]  Procedure(s) Performed: Procedure(s): LAPAROSCOPY DIAGNOSTIC (N/A) LAPAROSCOPIC LYSIS OF ADHESION (N/A)  Patient Location: PACU  Anesthesia Type:General  Level of Consciousness: awake, alert  and oriented  Airway and Oxygen Therapy: Patient Spontanous Breathing  Post-op Pain: mild  Post-op Assessment: Post-op Vital signs reviewed, Patient's Cardiovascular Status Stable, Respiratory Function Stable, Patent Airway and Pain level controlled  Post-op Vital Signs: stable  Last Vitals:  Filed Vitals:   04/15/14 0933  BP: 123/57  Pulse: 81  Temp: 36.5 C  Resp: 15    Complications: No apparent anesthesia complications

## 2014-04-15 NOTE — Transfer of Care (Signed)
Immediate Anesthesia Transfer of Care Note  Patient: [name]  Procedure(s) Performed: Procedure(s): LAPAROSCOPY DIAGNOSTIC (N/A) LAPAROSCOPIC LYSIS OF ADHESION (N/A)  Patient Location: PACU  Anesthesia Type:General  Level of Consciousness: awake, alert  and oriented  Airway & Oxygen Therapy: Patient Spontanous Breathing and Patient connected to nasal cannula oxygen  Post-op Assessment: Report given to PACU RN, Post -op Vital signs reviewed and stable and Patient moving all extremities X 4  Post vital signs: Reviewed and stable  Complications: No apparent anesthesia complications

## 2014-04-15 NOTE — Anesthesia Preprocedure Evaluation (Signed)
Anesthesia Evaluation  Patient identified by MRN, date of birth, ID band Patient awake    Reviewed: Allergy & Precautions, H&P , NPO status , Patient's Chart, lab work & pertinent test results  Airway Mallampati: II TM Distance: >3 FB Neck ROM: Full    Dental  (+) Teeth Intact, Dental Advisory Given   Pulmonary former smoker,  breath sounds clear to auscultation        Cardiovascular hypertension, Rhythm:Regular Rate:Normal     Neuro/Psych    GI/Hepatic   Endo/Other  diabetes  Renal/GU      Musculoskeletal   Abdominal (+) + obese,   Peds  Hematology   Anesthesia Other Findings   Reproductive/Obstetrics                           Anesthesia Physical Anesthesia Plan  ASA: III  Anesthesia Plan: General   Post-op Pain Management:    Induction: Intravenous  Airway Management Planned: Oral ETT  Additional Equipment:   Intra-op Plan:   Post-operative Plan:   Informed Consent: I have reviewed the patients History and Physical, chart, labs and discussed the procedure including the risks, benefits and alternatives for the proposed anesthesia with the patient or authorized representative who has indicated his/her understanding and acceptance.   Dental advisory given  Plan Discussed with: CRNA and Anesthesiologist  Anesthesia Plan Comments: (Chronic abdominal Pain with diarrhea Hypokalemia felt due to #1 and diuretics Type 2 DM glucose 173 QT prolongation on ECG present on previous ECGs (-) cardiac cath 01/2011 Hypertension Chronic low back pain, \interstitial cystitis  Plan GA with oral ETT, will place defibrillator pads )        Anesthesia Quick Evaluation

## 2014-04-15 NOTE — Op Note (Signed)
[name], [name]              ACCOUNT NO.:  [id number]  MEDICAL RECORD NO.:  [id number]  LOCATION:  [facility]                         FACILITY:  [facility]  PHYSICIAN:  Marcello Moores A. Cornett, M.D.DATE OF BIRTH:  [birth year]  DATE OF PROCEDURE:  04/15/2014 DATE OF DISCHARGE:                              OPERATIVE REPORT   PREOPERATIVE DIAGNOSES:  Pelvic and right lower quadrant pain with unclear etiology.  POSTOPERATIVE DIAGNOSES:  Pelvic and right lower quadrant pain with unclear etiology, minimal intra-abdominal adhesion.  PROCEDURE:  Diagnostic laparoscopy with laparoscopic lysis of adhesion.  SURGEON:  Marcello Moores A. Cornett, M.D.  ANESTHESIA:  General endotracheal anesthesia with 0.25% Sensorcaine local with epinephrine.  ESTIMATED BLOOD LOSS:  Minimal.  SPECIMENS:  None.  DRAINS:  None.  IV FLUIDS:  Approximately 400 mL crystalloid.  INDICATIONS FOR PROCEDURE:  The patient is a 53 year old female, whose has had a 2-1/2 months history of right lower quadrant pain and pelvic pain.  She had pain with intercourse and also has a history of interstitial cystitis.  She was evaluated for this chronic right lower quadrant abdominal pain and had this many years ago secondary to adhesions.  A CT scan was relatively unremarkable.  We discussed the utility of surgery in this circumstance which was unpredictable.  I discussed the risks of the procedure to include, but not exclusive of bleeding, infection, organ injury, bladder injury, death, DVT, the need for an open procedure, bowel injury, sepsis, possible bowel resection, possible ostomy, and the need for other potential procedures.  Success in relieving pain with surgery __________ which I told the patient and that her pain with prior recurrence 6 months even with surgical intervention.  We discussed the above, but she wished to try laparoscopy since she has had a successful pass with managing her pain.  DESCRIPTION OF PROCEDURE:  The  patient was met in the holding area and questions answered.  She was taken back to the operating room and placed supine on the OR table.  After induction of general anesthesia, Foley catheter was placed under sterile conditions.  A time-out was done after sterile prep and drape and she received 300 mg of clindamycin.  A left upper quadrant Optiview 5 mm port was placed with the scope passed to the abdominal layers into the abdominal cavity without injury. Insufflation was then created with 15 mmHg of pressure.  She was then placed with head down.  Two other additional left-sided 5 mm ports were placed.  Her liver was normal.  Her stomach was grossly normal.  We examined her ascending colon and found an appendiceal stump from previous appendectomy and this looks noninflamed.  Minimal appendix remained.  There was significant hard stool in the cecum.  Small intestine was run retrograde from that up to the mid jejunum and this was grossly normal without signs of Meckel's diverticulum.  There was a single adhesive band of the omentum down into the pelvis which I lysed. There was some scarring over the bladder and pubis, and I went ahead and pulled some of the preperitoneal fat down and saw no evidence of hernia from a previous transverse incision from hysterectomy.  Uterus and ovaries were surgically absent.  The left colon was fixed to the left pelvic sidewall, but this did not appear inflamed.  I saw some scarring which could have been some very old burned out endometriosis in the pelvis.  This was quite minimal.  Remarkably, she had very few adhesions in her pelvis.  Transverse colon was normal.  Descending colon grossly normal.  It appeared there was some scarring in the gallbladder fossa. Upon further inspection of cavity, no other etiology of her pain was identified except for the single pelvic adhesion which was lysed and the significant stool burden in her cecum.  At this point in  time, hemostasis was achieved.  We elected to allow the CO2 to escape after inspection of the abdominal cavity which revealed no evidence of bowel injury or other organ injury.  We removed our ports allowing the CO2 to escape at this point in time.  The skin closed with 4-0 Monocryl. Dermabond applied.  All final counts of sponge, needle, and instruments found to be correct at this portion of the case.  The patient was awoke, extubated, and taken to recovery in satisfactory condition.     Thomas A. Cornett, M.D.     TAC/MEDQ  D:  04/15/2014  T:  04/15/2014  Job:  [id number]

## 2014-04-15 NOTE — H&P (Signed)
[name]  03/03/2014 3:00 PM   Office Visit  MRN:  [id number]   Description: 53 year old female  Provider: Joyice Faster. Cornett, MD  Department: [facility]         Guarantor Account: [name] ([id number])      Relation to Patient: Account Type Service Area      Self Personal/Family [place] for This Account     Coverage ID Payor Plan Insurance ID      [id number] [employer] CIGNA MANAGED [id number]              Guarantor Account: [name] ([id number])      Relation to Patient: Account Type Service Area      Self Personal/Family [place]                 Guarantor Account: [name] ([id number])      Relation to Patient: Account Type Service Area      Self Personal/Family [facility]                 Guarantor Account: [name] ([id number])      Relation to Patient: Account Type Service Area      Self Personal/Family [place] for This Account     Coverage ID Payor Plan Insurance ID      [id number] [place] [id number]                Diagnoses      Abdominal pain, chronic, right lower quadrant    -  Primary      789.03,  338.29             Reason for Visit      eval abd pain          Reason For Visit History Recorded             Current Vitals Most recent update: 03/03/2014  2:55 PM by [name], CMA      BP Pulse Temp(Src) Resp Ht Wt      130/80 76 97.5 F (36.4 C) 16 5\' 6"  (1.676 m) 222 lb 9.6 oz (100.971 kg)      BMI              35.95 kg/m2                      Progress Notes      Wendy A. Cornett, MD at 03/03/2014  3:25 PM      Status: Signed            Patient ID: [name], female   DOB: 13-Apr-1961, 53 y.o.   MRN: [id number]    Chief Complaint   Patient presents with   .  eval abd pain        HPI [name] is a 53 y.o. female.  Pt  presents at the request of Dr [name] for 2 week hx of sharp right lower quadrant abdominal pain.  Came on sharp.  Pain with intercourse.  Pain with lifting.  Otherwise constant background sharp pain. No change in bowel function.  Has hysterectomy and 2 c sections.  Most pain around the RIGHT lower  quadrant incision.  Had appendectomy in the past.  Has IC.  Followed by Dr [name].   HPI    Past Medical History   Diagnosis  Date   .  Interstitial cystitis     .  GERD (gastroesophageal reflux disease)     .  Hypertension     .  DDD (degenerative disc disease)         s/p back surgeries and steroid injxns   .  Diabetes mellitus     .  Hyperlipidemia     .  Obesity     .  Depression     .  Anxiety     .  Barrett esophagus         Dr. Olevia Perches in GI   .  Pneumonia     .  Kidney stones           Past Surgical History   Procedure  Laterality  Date   .  Neck surgery    12/2007   .  Back surgery    12/2007   .  Appendectomy       .  Total abdominal hysterectomy       .  Cholecystectomy       .  Colonoscopy    2011   .  Upper gi endoscopy             Family History   Problem  Relation  Age of Onset   .  Hypertension  Father     .  Heart disease  Mother         ischemic   .  Allergies  Sister     .  Asthma  Sister     .  Gallbladder disease  Mother     .  Colon polyps  Father     .  Other  Mother         rectovaginal fistula        Social History History   Substance Use Topics   .  Smoking status:  Former Smoker -- 0.30 packs/day for 17 years       Types:  Cigarettes       Quit date:  11/18/2011   .  Smokeless tobacco:  Never Used   .  Alcohol Use:  Yes         Comment: rare         Allergies   Allergen  Reactions   .  Latex  Anaphylaxis   .  Penicillins  Anaphylaxis   .  Erythromycin  Hives and Itching   .  Ofloxacin  Hives and Itching   .  Sulfonamide Derivatives  Hives and Itching         Current Outpatient Prescriptions   Medication  Sig  Dispense   Refill   .  ACCU-CHEK AVIVA PLUS test strip  TEST 3 TIMES DAILY   100 strip   5   .  amLODipine (NORVASC) 5 MG tablet  Take 5 mg by mouth daily.         .  diazepam (VALIUM) 5 MG tablet  Take 2.5 mg by mouth at bedtime as needed (for muscle spasms).         .  dicyclomine (BENTYL) 20 MG tablet  Take 20 mg by mouth 2 (two) times daily as needed (for abdominal pain).          Marland Kitchen  esomeprazole (NEXIUM) 40 MG capsule  Take 1  capsule (40 mg total) by mouth daily before breakfast.   90 capsule   1   .  ezetimibe-simvastatin (VYTORIN) 10-40 MG per tablet  Take 1 tablet by mouth daily.         .  fluoruracil (CARAC) 0.5 % cream  Apply 1 application topically 2 (two) times daily.         Marland Kitchen  FLUoxetine (PROZAC) 40 MG capsule  Take 1 capsule (40 mg total) by mouth daily. NEEDS APPT FOR REFILLS   90 capsule   0   .  glipiZIDE (GLUCOTROL) 10 MG tablet           .  hydrochlorothiazide (HYDRODIURIL) 25 MG tablet  Take 1 tablet (25 mg total) by mouth daily. NEEDS APPT FOR REFILLS   90 tablet   0   .  HYDROcodone-acetaminophen (NORCO) 10-325 MG per tablet  Take 1 tablet by mouth 2 (two) times daily as needed for pain.         Marland Kitchen  HYDROcodone-acetaminophen (NORCO/VICODIN) 5-325 MG per tablet  Take 1 tablet by mouth every 6 (six) hours as needed for pain.   15 tablet   0   .  LORazepam (ATIVAN) 1 MG tablet           .  metFORMIN (GLUCOPHAGE-XR) 500 MG 24 hr tablet  Take 1,000 mg by mouth daily. NEEDS APPT FOR REFILLS         .  pravastatin (PRAVACHOL) 20 MG tablet           .  traMADol (ULTRAM) 50 MG tablet  Take 50 mg by mouth 2 (two) times daily as needed for pain.              No current facility-administered medications for this visit.        Review of Systems Review of Systems  Constitutional: Positive for activity change.  HENT: Negative.   Eyes: Negative.   Respiratory: Negative.   Cardiovascular: Negative.   Gastrointestinal: Positive for abdominal pain.  Genitourinary: Positive for dyspareunia.   Musculoskeletal: Negative.   Hematological: Negative.       Blood pressure 130/80, pulse 76, temperature 97.5 F (36.4 C), resp. rate 16, height 5\' 6"  (1.676 m), weight 222 lb 9.6 oz (100.971 kg).   Physical Exam Physical Exam  Constitutional: She is oriented to person, place, and time. She appears well-developed and well-nourished.  HENT:   Head: Normocephalic and atraumatic.  Eyes: Pupils are equal, round, and reactive to light. No scleral icterus.  Neck: Normal range of motion. Neck supple.  Cardiovascular: Normal rate and regular rhythm.   Pulmonary/Chest: Effort normal and breath sounds normal.  Abdominal: Soft. She exhibits no distension and no ascites. No hernia.    Musculoskeletal: Normal range of motion.  Neurological: She is alert and oriented to person, place, and time.  Skin: Skin is warm and dry.  Psychiatric: She has a normal mood and affect. Her behavior is normal. Judgment and thought content normal.      Data Reviewed none   Assessment    Right lower quadrant abdominal pain for two weeks     Plan    CT scan to further evaluate pain.  Pain along previous scar and will need to exclude hernia.  Adhesion don't ususally cause the type of pain described by patient. Had a long discussion with patient about  expectations from  surgery for adhesions and results are mixed and unpredictable with pain resolution in 50 % of  patients and half of those recurring in 6 months.   Risk of injury to internal organs and the need for open surgery ,  Organ resection,  Possible ostomy and other related procedures discussed.  The need for hernia repair and other procedures discussed. The risk of hernia repair include bleeding,  Infection,   Recurrence of the hernia,  Mesh use, chronic pain,  Organ injury,  Bowel injury,  Bladder injury,   nerve injury with numbness around the incision,  Death,  and worsening of preexisting  medical problems.  The alternatives to surgery have been  discussed as well..  Long term expectations of both operative and non operative treatments have been discussed.   The patient agrees to proceed.       [name]

## 2014-04-15 NOTE — Brief Op Note (Signed)
04/15/2014  8:37 AM  PATIENT:  [name]  53 y.o. female  PRE-OPERATIVE DIAGNOSIS:  ABDOMINAL PAIN  POST-OPERATIVE DIAGNOSIS:  ABDOMINAL PAIN  PROCEDURE:  Procedure(s): LAPAROSCOPY DIAGNOSTIC (N/A) LAPAROSCOPIC LYSIS OF ADHESION (N/A)  SURGEON:  Surgeon(s) and Role:    * Thomas A. Cornett, MD - Primary      ANESTHESIA:   general  EBL:   min  BLOOD ADMINISTERED:none  DRAINS: none   LOCAL MEDICATIONS USED:  BUPIVICAINE   SPECIMEN:  No Specimen  DISPOSITION OF SPECIMEN:  N/A  COUNTS:  YES  TOURNIQUET:  * No tourniquets in log *  DICTATION: .Other Dictation: Dictation Number [id number]  PLAN OF CARE: Discharge to home after PACU  PATIENT DISPOSITION:  PACU - hemodynamically stable.   Delay start of Pharmacological VTE agent (>24hrs) due to surgical blood loss or risk of bleeding: not applicable

## 2014-04-15 NOTE — Discharge Instructions (Signed)
LAPAROSCOPIC SURGERY: POST OP INSTRUCTIONS ° °1. DIET: Follow a light bland diet the first 24 hours after arrival home, such as soup, liquids, crackers, etc.  Be sure to include lots of fluids daily.  Avoid fast food or heavy meals as your are more likely to get nauseated.  Eat a low fat the next few days after surgery.   °2. Take your usually prescribed home medications unless otherwise directed. °3. PAIN CONTROL: °a. Pain is best controlled by a usual combination of three different methods TOGETHER: °i. Ice/Heat °ii. Over the counter pain medication °iii. Prescription pain medication °b. Most patients will experience some swelling and bruising around the incisions.  Ice packs or heating pads (30-60 minutes up to 6 times a day) will help. Use ice for the first few days to help decrease swelling and bruising, then switch to heat to help relax tight/sore spots and speed recovery.  Some people prefer to use ice alone, heat alone, alternating between ice & heat.  Experiment to what works for you.  Swelling and bruising can take several weeks to resolve.   °c. It is helpful to take an over-the-counter pain medication regularly for the first few weeks.  Choose one of the following that works best for you: °i. Naproxen (Aleve, etc)  Two 220mg tabs twice a day °ii. Ibuprofen (Advil, etc) Three 200mg tabs four times a day (every meal & bedtime) °iii. Acetaminophen (Tylenol, etc) 500-650mg four times a day (every meal & bedtime) °d. A  prescription for pain medication (such as oxycodone, hydrocodone, etc) should be given to you upon discharge.  Take your pain medication as prescribed.  °i. If you are having problems/concerns with the prescription medicine (does not control pain, nausea, vomiting, rash, itching, etc), please call us [phone] to see if we need to switch you to a different pain medicine that will work better for you and/or control your side effect better. °ii. If you need a refill on your pain medication,  please contact your pharmacy.  They will contact our office to request authorization. Prescriptions will not be filled after 5 pm or on week-ends. °4. Avoid getting constipated.  Between the surgery and the pain medications, it is common to experience some constipation.  Increasing fluid intake and taking a fiber supplement (such as Metamucil, Citrucel, FiberCon, MiraLax, etc) 1-2 times a day regularly will usually help prevent this problem from occurring.  A mild laxative (prune juice, Milk of Magnesia, MiraLax, etc) should be taken according to package directions if there are no bowel movements after 48 hours.   °5. Watch out for diarrhea.  If you have many loose bowel movements, simplify your diet to bland foods & liquids for a few days.  Stop any stool softeners and decrease your fiber supplement.  Switching to mild anti-diarrheal medications (Kayopectate, Pepto Bismol) can help.  If this worsens or does not improve, please call us. °6. Wash / shower every day.  You may shower over the dressings as they are waterproof.  Continue to shower over incision(s) after the dressing is off. °7. Remove your waterproof bandages 5 days after surgery.  You may leave the incision open to air.  You may replace a dressing/Band-Aid to cover the incision for comfort if you wish.  °8. ACTIVITIES as tolerated:   °a. You may resume regular (light) daily activities beginning the next day--such as daily self-care, walking, climbing stairs--gradually increasing activities as tolerated.  If you can walk 30 minutes without difficulty, it   is safe to try more intense activity such as jogging, treadmill, bicycling, low-impact aerobics, swimming, etc. b. Save the most intensive and strenuous activity for last such as sit-ups, heavy lifting, contact sports, etc  Refrain from any heavy lifting or straining until you are off narcotics for pain control.   c. DO NOT PUSH THROUGH PAIN.  Let pain be your guide: If it hurts to do something, don't  do it.  Pain is your body warning you to avoid that activity for another week until the pain goes down. d. You may drive when you are no longer taking prescription pain medication, you can comfortably wear a seatbelt, and you can safely maneuver your car and apply brakes. e. Dennis Bast may have sexual intercourse when it is comfortable.  9. FOLLOW UP in our office a. Please call [facility] at [phone] to set up an appointment to see your surgeon in the office for a follow-up appointment approximately 2-3 weeks after your surgery. b. Make sure that you call for this appointment the day you arrive home to insure a convenient appointment time. 10. IF YOU HAVE DISABILITY OR FAMILY LEAVE FORMS, BRING THEM TO THE OFFICE FOR PROCESSING.  DO NOT GIVE THEM TO YOUR DOCTOR.   WHEN TO CALL us [phone]: 1. Poor pain control 2. Reactions / problems with new medications (rash/itching, nausea, etc)  3. Fever over 101.5 F (38.5 C) 4. Inability to urinate 5. Nausea and/or vomiting 6. Worsening swelling or bruising 7. Continued bleeding from incision. 8. Increased pain, redness, or drainage from the incision   The clinic staff is available to answer your questions during regular business hours (8:30am-5pm).  Please dont hesitate to call and ask to speak to one of our nurses for clinical concerns.   If you have a medical emergency, go to the nearest emergency room or call 911.  A surgeon from [facility] is always on call at the [facility], [address], [address], [place], [place]  [address] ? MAIN: [phone] ? TOLL FREE: [phone] ?  FAX [phone] [email or web address]    Your potassium is low so take supplement for the next 5 days daily.   What to eat:  For your first meals, you should eat lightly; only small meals initially.  If you do not have nausea, you may eat larger meals.  Avoid spicy, greasy and heavy food.    General  Anesthesia, Adult, Care After  Refer to this sheet in the next few weeks. These instructions provide you with information on caring for yourself after your procedure. Your health care provider may also give you more specific instructions. Your treatment has been planned according to current medical practices, but problems sometimes occur. Call your health care provider if you have any problems or questions after your procedure.  WHAT TO EXPECT AFTER THE PROCEDURE  After the procedure, it is typical to experience:  Sleepiness.  Nausea and vomiting. HOME CARE INSTRUCTIONS  For the first 24 hours after general anesthesia:  Have a responsible person with you.  Do not drive a car. If you are alone, do not take public transportation.  Do not drink alcohol.  Do not take medicine that has not been prescribed by your health care provider.  Do not sign important papers or make important decisions.  You may resume a normal diet and activities as directed by your health care provider.  Change bandages (dressings) as directed.  If you  have questions or problems that seem related to general anesthesia, call the hospital and ask for the anesthetist or anesthesiologist on call. SEEK MEDICAL CARE IF:  You have nausea and vomiting that continue the day after anesthesia.  You develop a rash. SEEK IMMEDIATE MEDICAL CARE IF:  You have difficulty breathing.  You have chest pain.  You have any allergic problems. Document Released: 01/23/2001 Document Revised: 06/19/2013 Document Reviewed: 05/02/2013  [facility] Patient Information 2014 [place], [place].

## 2014-04-15 NOTE — Anesthesia Procedure Notes (Signed)
Procedure Name: Intubation Date/Time: 04/15/2014 7:40 AM Performed by: Trixie Deis A Pre-anesthesia Checklist: Patient identified, Timeout performed, Emergency Drugs available, Suction available and Patient being monitored Patient Re-evaluated:Patient Re-evaluated prior to inductionOxygen Delivery Method: Circle system utilized Preoxygenation: Pre-oxygenation with 100% oxygen Intubation Type: IV induction and Cricoid Pressure applied Ventilation: Mask ventilation without difficulty Laryngoscope Size: Mac and 3 Grade View: Grade I Tube type: Oral Tube size: 7.0 mm Number of attempts: 1 Airway Equipment and Method: Stylet Placement Confirmation: ETT inserted through vocal cords under direct vision,  breath sounds checked- equal and bilateral and positive ETCO2 Secured at: 21 cm Tube secured with: Tape Dental Injury: Teeth and Oropharynx as per pre-operative assessment

## 2014-04-15 NOTE — Addendum Note (Signed)
Addendum created 04/15/14 1040 by Leonor Liv, CRNA   Modules edited: Anesthesia Flowsheet

## 2014-04-16 ENCOUNTER — Encounter (HOSPITAL_COMMUNITY): Payer: Self-pay | Admitting: Surgery

## 2014-04-17 ENCOUNTER — Encounter (INDEPENDENT_AMBULATORY_CARE_PROVIDER_SITE_OTHER): Payer: Self-pay | Admitting: Surgery

## 2014-04-17 ENCOUNTER — Ambulatory Visit (INDEPENDENT_AMBULATORY_CARE_PROVIDER_SITE_OTHER): Payer: Managed Care, Other (non HMO) | Admitting: Surgery

## 2014-04-17 VITALS — BP 122/80 | Temp 97.9°F | Ht 66.0 in | Wt <= 1120 oz

## 2014-04-17 DIAGNOSIS — R1031 Right lower quadrant pain: Secondary | ICD-10-CM

## 2014-04-17 DIAGNOSIS — G8929 Other chronic pain: Secondary | ICD-10-CM

## 2014-04-17 LAB — POCT I-STAT 4, (NA,K, GLUC, HGB,HCT)
Glucose, Bld: 173 mg/dL — ABNORMAL HIGH (ref 70–99)
HCT: 39 % (ref 36.0–46.0)
Hemoglobin: 13.3 g/dL (ref 12.0–15.0)
POTASSIUM: 2.8 meq/L — AB (ref 3.7–5.3)
SODIUM: 141 meq/L (ref 137–147)

## 2014-04-17 NOTE — Progress Notes (Signed)
Urgent office This is a patient of Dr. Josetta Huddle. She is today status post diagnostic laparoscopy. The patient has latex allergy and some skin sensitivity. She is concerned about her port sites, especially the most superior port site on the left. There is some erythema around the incision. She can also visualize some whitish fluid underneath the Dermabond.  Filed Vitals:   04/17/14 1458  BP: 122/80  Temp: 97.9 F (36.6 C)     She has 3 left-sided 5 mm port sites that are each covered with Dermabond. There is bruising and mild erythema around each one. The most superior seems to have a small amount of fibrinous exudate under the Dermabond. I opened up the Dermabond over the wound. I cannot really express any purulence. She will treat this area with Neosporin. She will followup with Dr. Brantley Stage as scheduled.  Imogene Burn. Georgette Dover, MD, [facility]  General/ Trauma Surgery  04/17/2014 3:21 PM

## 2014-05-05 ENCOUNTER — Encounter (INDEPENDENT_AMBULATORY_CARE_PROVIDER_SITE_OTHER): Payer: Managed Care, Other (non HMO) | Admitting: Surgery

## 2014-05-12 ENCOUNTER — Encounter (INDEPENDENT_AMBULATORY_CARE_PROVIDER_SITE_OTHER): Payer: Managed Care, Other (non HMO) | Admitting: Surgery

## 2014-05-14 ENCOUNTER — Ambulatory Visit: Payer: Managed Care, Other (non HMO) | Admitting: *Deleted

## 2014-05-22 ENCOUNTER — Encounter (INDEPENDENT_AMBULATORY_CARE_PROVIDER_SITE_OTHER): Payer: Managed Care, Other (non HMO) | Admitting: Surgery

## 2014-07-29 ENCOUNTER — Encounter: Payer: Self-pay | Admitting: Internal Medicine

## 2014-09-14 ENCOUNTER — Emergency Department (HOSPITAL_COMMUNITY)
Admission: EM | Admit: 2014-09-14 | Discharge: 2014-09-14 | Disposition: A | Discharge status: Home / Self Care | Payer: Managed Care, Other (non HMO) | Attending: Emergency Medicine | Admitting: Emergency Medicine

## 2014-09-14 ENCOUNTER — Emergency Department (HOSPITAL_COMMUNITY): Payer: Managed Care, Other (non HMO)

## 2014-09-14 ENCOUNTER — Encounter (HOSPITAL_COMMUNITY): Payer: Self-pay

## 2014-09-14 DIAGNOSIS — E785 Hyperlipidemia, unspecified: Secondary | ICD-10-CM | POA: Insufficient documentation

## 2014-09-14 DIAGNOSIS — G8929 Other chronic pain: Secondary | ICD-10-CM | POA: Diagnosis not present

## 2014-09-14 DIAGNOSIS — Z87442 Personal history of urinary calculi: Secondary | ICD-10-CM | POA: Insufficient documentation

## 2014-09-14 DIAGNOSIS — Z8709 Personal history of other diseases of the respiratory system: Secondary | ICD-10-CM | POA: Insufficient documentation

## 2014-09-14 DIAGNOSIS — F329 Major depressive disorder, single episode, unspecified: Secondary | ICD-10-CM | POA: Diagnosis not present

## 2014-09-14 DIAGNOSIS — M79604 Pain in right leg: Secondary | ICD-10-CM | POA: Diagnosis not present

## 2014-09-14 DIAGNOSIS — K219 Gastro-esophageal reflux disease without esophagitis: Secondary | ICD-10-CM | POA: Insufficient documentation

## 2014-09-14 DIAGNOSIS — E669 Obesity, unspecified: Secondary | ICD-10-CM | POA: Insufficient documentation

## 2014-09-14 DIAGNOSIS — R072 Precordial pain: Secondary | ICD-10-CM | POA: Insufficient documentation

## 2014-09-14 DIAGNOSIS — E119 Type 2 diabetes mellitus without complications: Secondary | ICD-10-CM | POA: Insufficient documentation

## 2014-09-14 DIAGNOSIS — R609 Edema, unspecified: Secondary | ICD-10-CM | POA: Diagnosis not present

## 2014-09-14 DIAGNOSIS — R0602 Shortness of breath: Secondary | ICD-10-CM | POA: Diagnosis present

## 2014-09-14 DIAGNOSIS — F419 Anxiety disorder, unspecified: Secondary | ICD-10-CM | POA: Insufficient documentation

## 2014-09-14 DIAGNOSIS — I10 Essential (primary) hypertension: Secondary | ICD-10-CM | POA: Diagnosis not present

## 2014-09-14 DIAGNOSIS — Z87438 Personal history of other diseases of male genital organs: Secondary | ICD-10-CM | POA: Diagnosis not present

## 2014-09-14 DIAGNOSIS — Z87891 Personal history of nicotine dependence: Secondary | ICD-10-CM | POA: Insufficient documentation

## 2014-09-14 DIAGNOSIS — Z8701 Personal history of pneumonia (recurrent): Secondary | ICD-10-CM | POA: Insufficient documentation

## 2014-09-14 LAB — BASIC METABOLIC PANEL
ANION GAP: 17 — AB (ref 5–15)
BUN: 14 mg/dL (ref 6–23)
CO2: 26 meq/L (ref 19–32)
Calcium: 9.7 mg/dL (ref 8.4–10.5)
Chloride: 96 mEq/L (ref 96–112)
Creatinine, Ser: 0.59 mg/dL (ref 0.50–1.10)
GFR calc Af Amer: >90 mL/min (ref >90)
GFR calc non Af Amer: >90 mL/min (ref >90)
GLUCOSE: 203 mg/dL — AB (ref 70–99)
POTASSIUM: 3.3 meq/L — AB (ref 3.7–5.3)
Sodium: 139 mEq/L (ref 137–147)

## 2014-09-14 LAB — PRO B NATRIURETIC PEPTIDE: Pro B Natriuretic peptide (BNP): 9.1 pg/mL (ref 0–125)

## 2014-09-14 LAB — HEPATIC FUNCTION PANEL
ALT: 25 U/L (ref 0–35)
AST: 19 U/L (ref 0–37)
Albumin: 4 g/dL (ref 3.5–5.2)
Alkaline Phosphatase: 76 U/L (ref 39–117)
Total Bilirubin: 0.3 mg/dL (ref 0.3–1.2)
Total Protein: 7.2 g/dL (ref 6.0–8.3)

## 2014-09-14 LAB — CBC
HCT: 41.1 % (ref 36.0–46.0)
HEMOGLOBIN: 14.1 g/dL (ref 12.0–15.0)
MCH: 31.5 pg (ref 26.0–34.0)
MCHC: 34.3 g/dL (ref 30.0–36.0)
MCV: 91.9 fL (ref 78.0–100.0)
PLATELETS: 374 10*3/uL (ref 150–400)
RBC: 4.47 MIL/uL (ref 3.87–5.11)
RDW: 12.2 % (ref 11.5–15.5)
WBC: 7.9 10*3/uL (ref 4.0–10.5)

## 2014-09-14 LAB — PROTIME-INR
INR: 0.97 (ref 0.00–1.49)
PROTHROMBIN TIME: 13 s (ref 11.6–15.2)

## 2014-09-14 LAB — D-DIMER, QUANTITATIVE (NOT AT ARMC): D-Dimer, Quant: <0.27 ug/mL-FEU (ref 0.00–0.48)

## 2014-09-14 LAB — I-STAT TROPONIN, ED
TROPONIN I, POC: 0.01 ng/mL (ref 0.00–0.08)
TROPONIN I, POC: 0.01 ng/mL (ref 0.00–0.08)

## 2014-09-14 MED ORDER — HYDROCODONE-ACETAMINOPHEN 5-325 MG PO TABS
1.0000 | ORAL_TABLET | Freq: Once | ORAL | Status: AC
  Administered 2014-09-14: 1 via ORAL
  Filled 2014-09-14: qty 1

## 2014-09-14 MED ORDER — SODIUM CHLORIDE 0.9 % IV BOLUS (SEPSIS)
1000.0000 mL | Freq: Once | INTRAVENOUS | Status: AC
  Administered 2014-09-14: 1000 mL via INTRAVENOUS

## 2014-09-14 MED ORDER — ASPIRIN 81 MG PO CHEW
324.0000 mg | CHEWABLE_TABLET | Freq: Once | ORAL | Status: AC
  Administered 2014-09-14: 324 mg via ORAL
  Filled 2014-09-14: qty 4

## 2014-09-14 MED ORDER — IOHEXOL 350 MG/ML SOLN
80.0000 mL | Freq: Once | INTRAVENOUS | Status: AC | PRN
  Administered 2014-09-14: 80 mL via INTRAVENOUS

## 2014-09-14 NOTE — Discharge Instructions (Signed)
1. Follow up with your primary care doctor and your orthopedist 2. Come back if worsening symptoms Chest Pain (Nonspecific) It is often hard to give a specific diagnosis for the cause of chest pain. There is always a chance that your pain could be related to something serious, such as a heart attack or a blood clot in the lungs. You need to follow up with your health care provider for further evaluation. CAUSES   Heartburn.  Pneumonia or bronchitis.  Anxiety or stress.  Inflammation around your heart (pericarditis) or lung (pleuritis or pleurisy).  A blood clot in the lung.  A collapsed lung (pneumothorax). It can develop suddenly on its own (spontaneous pneumothorax) or from trauma to the chest.  Shingles infection (herpes zoster virus). The chest wall is composed of bones, muscles, and cartilage. Any of these can be the source of the pain.  The bones can be bruised by injury.  The muscles or cartilage can be strained by coughing or overwork.  The cartilage can be affected by inflammation and become sore (costochondritis). DIAGNOSIS  Lab tests or other studies may be needed to find the cause of your pain. Your health care provider may have you take a test called an ambulatory electrocardiogram (ECG). An ECG records your heartbeat patterns over a 24-hour period. You may also have other tests, such as:  Transthoracic echocardiogram (TTE). During echocardiography, sound waves are used to evaluate how blood flows through your heart.  Transesophageal echocardiogram (TEE).  Cardiac monitoring. This allows your health care provider to monitor your heart rate and rhythm in real time.  Holter monitor. This is a portable device that records your heartbeat and can help diagnose heart arrhythmias. It allows your health care provider to track your heart activity for several days, if needed.  Stress tests by exercise or by giving medicine that makes the heart beat faster. TREATMENT    Treatment depends on what may be causing your chest pain. Treatment may include:  Acid blockers for heartburn.  Anti-inflammatory medicine.  Pain medicine for inflammatory conditions.  Antibiotics if an infection is present.  You may be advised to change lifestyle habits. This includes stopping smoking and avoiding alcohol, caffeine, and chocolate.  You may be advised to keep your head raised (elevated) when sleeping. This reduces the chance of acid going backward from your stomach into your esophagus. Most of the time, nonspecific chest pain will improve within 2-3 days with rest and mild pain medicine.  HOME CARE INSTRUCTIONS   If antibiotics were prescribed, take them as directed. Finish them even if you start to feel better.  For the next few days, avoid physical activities that bring on chest pain. Continue physical activities as directed.  Do not use any tobacco products, including cigarettes, chewing tobacco, or electronic cigarettes.  Avoid drinking alcohol.  Only take medicine as directed by your health care provider.  Follow your health care provider's suggestions for further testing if your chest pain does not go away.  Keep any follow-up appointments you made. If you do not go to an appointment, you could develop lasting (chronic) problems with pain. If there is any problem keeping an appointment, call to reschedule. SEEK MEDICAL CARE IF:   Your chest pain does not go away, even after treatment.  You have a rash with blisters on your chest.  You have a fever. SEEK IMMEDIATE MEDICAL CARE IF:   You have increased chest pain or pain that spreads to your arm, neck, jaw, back,  or abdomen.  You have shortness of breath.  You have an increasing cough, or you cough up blood.  You have severe back or abdominal pain.  You feel nauseous or vomit.  You have severe weakness.  You faint.  You have chills. This is an emergency. Do not wait to see if the pain will  go away. Get medical help at once. Call your local emergency services (911 in [place] Do not drive yourself to the hospital. MAKE SURE YOU:   Understand these instructions.  Will watch your condition.  Will get help right away if you are not doing well or get worse. Document Released: 07/27/2005 Document Revised: 10/22/2013 Document Reviewed: 05/22/2008 [facility] Patient Information 2015 [place], [place]. This information is not intended to replace advice given to you by your health care provider. Make sure you discuss any questions you have with your health care provider.

## 2014-09-14 NOTE — Progress Notes (Signed)
VASCULAR LAB PRELIMINARY  PRELIMINARY  PRELIMINARY  PRELIMINARY  Right lower extremity venous Doppler completed.    Preliminary report:  There is no DVT or SVT noted in the right lower extremity.  There is fluid noted in the posterior distal thigh, popliteal fossa, and proximal calf, not consistent with a Baker's cyst.    Camielle Sizer, RVT 09/14/2014, 2:27 PM

## 2014-09-14 NOTE — ED Provider Notes (Signed)
CSN: [id number]     Arrival date & time 09/14/14  1033 History   First MD Initiated Contact with Patient 09/14/14 1129     Chief Complaint  Patient presents with  . Dizziness  . Leg Swelling  . Shortness of Breath     (Consider location/radiation/quality/duration/timing/severity/associated sxs/prior Treatment) Patient is a 53 y.o. female presenting with shortness of breath. The history is provided by the patient. No language interpreter was used.  Shortness of Breath Severity:  Moderate Onset quality:  Gradual Duration:  3 days Timing:  Constant Progression:  Worsening Chronicity:  New Context: not URI   Context comment:  Recent back surgery Relieved by:  Rest Worsened by:  Nothing tried Ineffective treatments:  None tried Associated symptoms: chest pain   Associated symptoms: no abdominal pain, no cough, no diaphoresis, no fever, no headaches, no PND, no rash, no sore throat, no sputum production, no syncope and no vomiting   Risk factors: recent surgery   Risk factors: no family hx of DVT, no hx of PE/DVT, no oral contraceptive use and no prolonged immobilization     Past Medical History  Diagnosis Date  . Interstitial cystitis   . Hyperlipidemia     takes Pravastatin daily  . Obesity   . Depression   . Anxiety   . Barrett esophagus     Dr. Olevia Perches in GI  . Hypertension     takes Amlodipine and HCTZ daily  . GERD (gastroesophageal reflux disease)     takes Nexium daily  . Muscle spasm     takes Ativan nightly as needed  . PONV (postoperative nausea and vomiting)   . Sinus infection     recently treated with ZPAK  . Pneumonia     fall 2014  . Spinal headache     related to neck   . Fainting     related to new diabetes meds;Dr.Hawks knows  . Numbness     weakness in right leg  . DDD (degenerative disc disease)     s/p back surgeries and steroid injxns  . Joint pain   . Joint swelling   . Chronic back pain     stenosis and DDD  . Diarrhea     r/t  diabetes meds  . Urinary frequency   . History of kidney stones   . Diabetes mellitus     takes Invokana and Glipizide daily   Past Surgical History  Procedure Laterality Date  . Neck surgery  12/2007  . Appendectomy    . Total abdominal hysterectomy    . Cholecystectomy    . Colonoscopy  2011  . Upper gi endoscopy    . Cesarean section  1988/1989  . Oophorectomy    . Adhesions removed from abdomen    . Back surgery  12/2007    x 6-fusion  . Knee arthroscopy Bilateral   . Epidural injections    . Laparoscopy N/A 04/15/2014    Procedure: LAPAROSCOPY DIAGNOSTIC;  Surgeon: Joyice Faster. Cornett, MD;  Location: [facility];  Service: General;  Laterality: N/A;  . Laparoscopic lysis of adhesions N/A 04/15/2014    Procedure: LAPAROSCOPIC LYSIS OF ADHESION;  Surgeon: Joyice Faster. Cornett, MD;  Location: [facility];  Service: General;  Laterality: N/A;   Family History  Problem Relation Age of Onset  . Hypertension Father   . Heart disease Mother     ischemic  . Allergies Sister   . Asthma Sister   . Gallbladder disease Mother   .  Colon polyps Father   . Other Mother     rectovaginal fistula   History  Substance Use Topics  . Smoking status: Former Smoker -- 0.30 packs/day for 17 years    Types: Cigarettes  . Smokeless tobacco: Never Used     Comment: quit smoking a year and a half ago  . Alcohol Use: Yes     Comment: rarely   OB History    No data available     Review of Systems  Constitutional: Negative for fever and diaphoresis.  HENT: Negative for congestion, rhinorrhea and sore throat.   Respiratory: Positive for shortness of breath. Negative for cough and sputum production.   Cardiovascular: Positive for chest pain. Negative for syncope and PND.  Gastrointestinal: Negative for nausea, vomiting, abdominal pain and diarrhea.  Genitourinary: Negative for dysuria and hematuria.  Skin: Negative for rash.  Neurological: Negative for syncope, light-headedness and headaches.  All other  systems reviewed and are negative.     Allergies  Latex; Penicillins; Erythromycin; Ofloxacin; and Sulfonamide derivatives  Home Medications   Prior to Admission medications   Medication Sig Start Date End Date Taking? Authorizing Provider  ACCU-CHEK AVIVA PLUS test strip TEST 3 TIMES DAILY 03/06/12   Tanda Rockers, MD  amLODipine (NORVASC) 5 MG tablet Take 5 mg by mouth daily.    Historical Provider, MD  Canagliflozin (INVOKANA) 100 MG TABS Take 100 mg by mouth daily.    Historical Provider, MD  dicyclomine (BENTYL) 20 MG tablet Take 20 mg by mouth 2 (two) times daily as needed (for abdominal pain).  09/12/11   Tammy S Parrett, NP  esomeprazole (NEXIUM) 40 MG capsule Take 1 capsule (40 mg total) by mouth daily before breakfast. 07/09/12   Lafayette Dragon, MD  FLUoxetine (PROZAC) 40 MG capsule Take 1 capsule (40 mg total) by mouth daily. NEEDS APPT FOR REFILLS 07/10/12   Tanda Rockers, MD  glipiZIDE (GLUCOTROL) 10 MG tablet Take 10 mg by mouth daily before breakfast.  02/11/14   Historical Provider, MD  hydrochlorothiazide (HYDRODIURIL) 25 MG tablet Take 1 tablet (25 mg total) by mouth daily. NEEDS APPT FOR REFILLS 07/10/12   Tanda Rockers, MD  HYDROcodone-acetaminophen [facility]) 10-325 MG per tablet Take 1 tablet by mouth 2 (two) times daily as needed for pain.    Historical Provider, MD  LORazepam (ATIVAN) 1 MG tablet Take 0.5 mg by mouth at bedtime as needed (muscle spasms).    Historical Provider, MD  oxyCODONE-acetaminophen (ROXICET) 5-325 MG per tablet Take 1 tablet by mouth every 4 (four) hours as needed. 04/15/14   Erroll Luna, MD  potassium chloride (K-DUR) 10 MEQ tablet Take 1 tablet (10 mEq total) by mouth daily. 04/15/14   Erroll Luna, MD  pravastatin (PRAVACHOL) 20 MG tablet Take 20 mg by mouth daily.    Historical Provider, MD  traMADol (ULTRAM) 50 MG tablet Take 50 mg by mouth 2 (two) times daily as needed for pain.     Historical Provider, MD   BP 126/98 mmHg  Pulse 78   Temp(Src) 97.8 F (36.6 C) (Oral)  Resp 20  Ht 5\' 6"  (1.676 m)  Wt 195 lb (88.451 kg)  BMI 31.49 kg/m2  SpO2 99% Physical Exam  Constitutional: She is oriented to person, place, and time. She appears well-developed and well-nourished.  HENT:  Head: Normocephalic and atraumatic.  Right Ear: External ear normal.  Left Ear: External ear normal.  Eyes: EOM are normal.  Neck: Normal range of  motion. Neck supple.  Cardiovascular: Normal rate, regular rhythm and intact distal pulses.  Exam reveals no gallop and no friction rub.   No murmur heard. Pulmonary/Chest: Effort normal and breath sounds normal. No respiratory distress. She has no wheezes. She has no rales. She exhibits no tenderness.  Abdominal: Soft. Bowel sounds are normal. She exhibits no distension. There is no tenderness. There is no rebound.  Musculoskeletal: Normal range of motion. She exhibits edema (right leg) and tenderness (right leg).  Lymphadenopathy:    She has no cervical adenopathy.  Neurological: She is alert and oriented to person, place, and time.  Skin: Skin is warm. No rash noted.  Psychiatric: She has a normal mood and affect. Her behavior is normal.  Nursing note and vitals reviewed.   ED Course  Procedures (including critical care time) Labs Review Labs Reviewed  BASIC METABOLIC PANEL - Abnormal; Notable for the following:    Potassium 3.3 (*)    Glucose, Bld 203 (*)    Anion gap 17 (*)    All other components within normal limits  CBC  PRO B NATRIURETIC PEPTIDE  PROTIME-INR  HEPATIC FUNCTION PANEL  D-DIMER, QUANTITATIVE  I-STAT TROPOININ, ED  Randolm Idol, ED    Imaging Review Dg Chest 2 View  09/14/2014   CLINICAL DATA:  Right-sided chest pain. Shortness of breath for 2 days.  EXAM: CHEST  2 VIEW  COMPARISON:  Chest radiograph 04/08/2014  FINDINGS: The heart size and mediastinal contours are within normal limits. Both lungs are clear. The visualized skeletal structures are unremarkable.   IMPRESSION: No active cardiopulmonary disease.   Electronically Signed   By: Curlene Dolphin M.D.   On: 09/14/2014 12:18   Ct Angio Chest Pe W/cm &/or Wo Cm  09/14/2014   CLINICAL DATA:  Shortness of breath for 2 days. Dizziness since yesterday. Right lower leg swelling this morning. Possible pulmonary embolism.  EXAM: CT ANGIOGRAPHY CHEST WITH CONTRAST  TECHNIQUE: Multidetector CT imaging of the chest was performed using the standard protocol during bolus administration of intravenous contrast. Multiplanar CT image reconstructions and MIPs were obtained to evaluate the vascular anatomy.  CONTRAST:  105mL OMNIPAQUE IOHEXOL 350 MG/ML SOLN  COMPARISON:  Chest radiographs earlier today and CT 01/17/2005.  FINDINGS: Pulmonary arterial opacification is adequate without filling defects seen to indicate emboli. Mild to moderate LAD coronary artery calcification is noted. Heart is within normal limits in size. No enlarged axillary, mediastinal, or hilar lymph nodes are identified.  There is no pleural or pericardial effusion. Major airways are patent. Minimal dependent atelectasis is present in the lower lobes. No airspace consolidation or lung mass is seen.  Visualized portion of the upper abdomen is unremarkable. Mild thoracic spondylosis is noted.  Review of the MIP images confirms the above findings.  IMPRESSION: No evidence of pulmonary emboli or other acute abnormality in the chest.   Electronically Signed   By: Logan Bores   On: 09/14/2014 14:59     EKG Interpretation None      MDM   Final diagnoses:  Shortness of breath  Right leg pain  Precordial pain    11:31 AM Pt is a 53 y.o. female with pertinent PMHX of HLD, DM, HTN who presents to the ED with shortness of breath, right leg swelling and pain. Recent procedure on back. Endorses gradually worsening since Friday. Recent operation on back. Endorses sharp chest pains on the left intermittent without worsening or relieving factors. Non pleuritic.  Endorses right leg swelling  since Friday, noticed a large knot on the back of her knee yesterday evening. Also endorsing associated shortness of breath. No fevers or recent illness. Endorses nausea, but no vomiting or diarrhea. No recent immobilization. Unable to [facility] given age. Patient's concern for possible DVT/PE  HEART score 3, atypical chest pain. Concern for possible DVT/PE given recent surgery and symptoms. currently no hypoxia or tachycardia. Plan for CBC, CMP, delta istat troponin, BNP, EKG, CXR. No infectious symptoms   EKG personally reviewed by myself showed NSR,, low voltage, non specific t wave changes Rate of 87, PR 1105ms, QRS 23ms QT/QTC 378/458ms, normal axis, without evidence of new ischemia. Comparison showed similar, indication: chest pain and shortness of breath  CXR PA/LAT for shortness of breath and chest pain per my read showed no focal consolidation no cardiomegaly  Review of labs: CBC: no leukocytosis, H&H 14.1/41.1 BMP: hypokalemia,  istat troponin: 0.01 LFTs: no elevated LFTs BNP: 9.1 D-dimer: <0.27 Delta istat troponin:  RLE DVT study no evidence of DVT no baker's cyst. Incidental finding of nonspecific swelling of the popliteal fossa.  CTA PE study  Plan for discharge with close follow up with orthopedics for further imaging of the right knee. No evidence of DVT/PE  CTA PE study negative for PE. Delta troponin negative. Plan for discharge home with close follow up with PCP. Patient to follow up with orthopedics for further imaging of non-specific swelling of the right knee  3:19 PM:  I have discussed the diagnosis/risks/treatment options with the patient and believe the pt to be eligible for discharge home to follow-up with PCP and orthopedics. We also discussed returning to the ED immediately if new or worsening sx occur. We discussed the sx which are most concerning (e.g., worsening symptoms) that necessitate immediate return. Any new prescriptions provided to  the patient are listed below.   New Prescriptions   No medications on file    The patient appears reasonably screened and/or stabilized for discharge and I doubt any other medical condition or other [facility] requiring further screening, evaluation or treatment in the ED at this time prior to discharge . Pt in agreement with discharge plan. Return precautions given. Pt discharged VSS   Labs, EKG and imaging reviewed by myself and considered in medical decision making if ordered.  Imaging interpreted by radiology. Pt was discussed with my attending, Dr. Linna Hoff, MD 09/14/14 [place], MD 09/15/14 1054

## 2014-09-14 NOTE — ED Notes (Signed)
Spoke w/Retta Pitcher,[facility]

## 2014-09-14 NOTE — ED Notes (Signed)
Pt states she had a procedure for back pain done last Friday. Since has had some SOB, and yesterday started having some dizziness. This morning woke up with swelling to right lower leg and a knot behind right knee. No recent travel.

## 2014-09-14 NOTE — ED Notes (Signed)
Patient transported to Ultrasound 

## 2014-09-16 ENCOUNTER — Ambulatory Visit
Admission: RE | Admit: 2014-09-16 | Discharge: 2014-09-16 | Disposition: A | Discharge status: Home / Self Care | Payer: Managed Care, Other (non HMO) | Source: Ambulatory Visit | Attending: Orthopedic Surgery | Admitting: Orthopedic Surgery

## 2014-09-16 ENCOUNTER — Other Ambulatory Visit: Payer: Self-pay | Admitting: Orthopedic Surgery

## 2014-09-16 DIAGNOSIS — M25561 Pain in right knee: Secondary | ICD-10-CM

## 2014-09-16 MED ORDER — GADOBENATE DIMEGLUMINE 529 MG/ML IV SOLN
16.0000 mL | Freq: Once | INTRAVENOUS | Status: AC | PRN
  Administered 2014-09-16: 16 mL via INTRAVENOUS

## 2014-10-09 ENCOUNTER — Encounter (HOSPITAL_COMMUNITY): Payer: Self-pay | Admitting: Cardiovascular Disease

## 2015-02-26 ENCOUNTER — Other Ambulatory Visit: Payer: Self-pay | Admitting: Neurosurgery

## 2015-03-16 ENCOUNTER — Encounter (HOSPITAL_COMMUNITY)
Admission: RE | Admit: 2015-03-16 | Discharge: 2015-03-16 | Disposition: A | Discharge status: Home / Self Care | Payer: Managed Care, Other (non HMO) | Source: Ambulatory Visit | Attending: Neurosurgery | Admitting: Neurosurgery

## 2015-03-16 ENCOUNTER — Encounter (HOSPITAL_COMMUNITY): Payer: Self-pay

## 2015-03-16 DIAGNOSIS — M461 Sacroiliitis, not elsewhere classified: Secondary | ICD-10-CM | POA: Insufficient documentation

## 2015-03-16 DIAGNOSIS — Z01812 Encounter for preprocedural laboratory examination: Secondary | ICD-10-CM | POA: Insufficient documentation

## 2015-03-16 LAB — BASIC METABOLIC PANEL
Anion gap: 10 (ref 5–15)
BUN: 14 mg/dL (ref 6–20)
CALCIUM: 9.6 mg/dL (ref 8.9–10.3)
CO2: 29 mmol/L (ref 22–32)
Chloride: 99 mmol/L — ABNORMAL LOW (ref 101–111)
Creatinine, Ser: 0.74 mg/dL (ref 0.44–1.00)
GLUCOSE: 140 mg/dL — AB (ref 65–99)
POTASSIUM: 3.3 mmol/L — AB (ref 3.5–5.1)
SODIUM: 138 mmol/L (ref 135–145)

## 2015-03-16 LAB — CBC
HCT: 41 % (ref 36.0–46.0)
HEMOGLOBIN: 14.2 g/dL (ref 12.0–15.0)
MCH: 31.3 pg (ref 26.0–34.0)
MCHC: 34.6 g/dL (ref 30.0–36.0)
MCV: 90.5 fL (ref 78.0–100.0)
Platelets: 348 10*3/uL (ref 150–400)
RBC: 4.53 MIL/uL (ref 3.87–5.11)
RDW: 12.5 % (ref 11.5–15.5)
WBC: 13.4 10*3/uL — AB (ref 4.0–10.5)

## 2015-03-16 LAB — SURGICAL PCR SCREEN
MRSA, PCR: NEGATIVE
Staphylococcus aureus: NEGATIVE

## 2015-03-16 LAB — GLUCOSE, CAPILLARY: GLUCOSE-CAPILLARY: 143 mg/dL — AB (ref 65–99)

## 2015-03-16 NOTE — Progress Notes (Signed)
Average fasting blood sugar runs 120-140

## 2015-03-16 NOTE — Progress Notes (Addendum)
Medical Md is Dr.Aldene Hawks  EKG and CXR in epic from 09-14-14  Heart cath in epic from 2013  Stress test unsure but thinks in 2013  Echo thinks but not sure  Pt doesn't have a Cardiologist but did see Dr.C while in hospital in June 2015

## 2015-03-16 NOTE — Pre-Procedure Instructions (Signed)
FRANCESS MULLEN  03/16/2015   Your procedure is scheduled on:  Tues, May 24 @ 11:40 AM  Report to [facility]  at 9:40 AM.  Call this number if you have problems the morning of surgery: [phone]   Remember:   Do not eat food or drink liquids after midnight.   Take these medicines the morning of surgery with A SIP OF WATER: Amlodipine(Norvasc),Bentyl(Dicyclomine),Nexium(Esomeprazole),Prozac(Fluoxetine),and Pain Pill(if needed)               No Goody's,BC's,Aleve,Aspirin,Ibuprofen,Fish Oil,or any Herbal Medications.    Do not wear jewelry, make-up or nail polish.  Do not wear lotions, powders, or perfumes. You may wear deodorant.  Do not shave 48 hours prior to surgery.   Do not bring valuables to the hospital.  [facility] is not responsible                  for any belongings or valuables.               Contacts, dentures or bridgework may not be worn into surgery.  Leave suitcase in the car. After surgery it may be brought to your room.  For patients admitted to the hospital, discharge time is determined by your                treatment team.                   Special Instructions:  [place] - Preparing for Surgery  Before surgery, you can play an important role.  Because skin is not sterile, your skin needs to be as free of germs as possible.  You can reduce the number of germs on you skin by washing with CHG (chlorahexidine gluconate) soap before surgery.  CHG is an antiseptic cleaner which kills germs and bonds with the skin to continue killing germs even after washing.  Please DO NOT use if you have an allergy to CHG or antibacterial soaps.  If your skin becomes reddened/irritated stop using the CHG and inform your nurse when you arrive at Short Stay.  Do not shave (including legs and underarms) for at least 48 hours prior to the first CHG shower.  You may shave your face.  Please follow these instructions carefully:   1.  Shower with CHG Soap the night  before surgery and the                                morning of Surgery.  2.  If you choose to wash your hair, wash your hair first as usual with your       normal shampoo.  3.  After you shampoo, rinse your hair and body thoroughly to remove the                      Shampoo.  4.  Use CHG as you would any other liquid soap.  You can apply chg directly       to the skin and wash gently with scrungie or a clean washcloth.  5.  Apply the CHG Soap to your body ONLY FROM THE NECK DOWN.        Do not use on open wounds or open sores.  Avoid contact with your eyes,       ears, mouth and genitals (private parts).  Wash genitals (private parts)  with your normal soap.  6.  Wash thoroughly, paying special attention to the area where your surgery        will be performed.  7.  Thoroughly rinse your body with warm water from the neck down.  8.  DO NOT shower/wash with your normal soap after using and rinsing off       the CHG Soap.  9.  Pat yourself dry with a clean towel.            10.  Wear clean pajamas.            11.  Place clean sheets on your bed the night of your first shower and do not        sleep with pets.  Day of Surgery  Do not apply any lotions/deoderants the morning of surgery.  Please wear clean clothes to the hospital/surgery center.     Please read over the following fact sheets that you were given: Pain Booklet, Coughing and Deep Breathing, MRSA Information and Surgical Site Infection Prevention

## 2015-03-19 NOTE — Progress Notes (Addendum)
Spoke with [facility] and they will fax over pts most recent [facility] from March 2016.  Spoke with Gardiner Rhyme NP and she stated that her labs were not that bad at PAT visit and [facility] from March 14th was 6.6

## 2015-03-24 ENCOUNTER — Inpatient Hospital Stay (HOSPITAL_COMMUNITY): Payer: Managed Care, Other (non HMO) | Admitting: Anesthesiology

## 2015-03-24 ENCOUNTER — Inpatient Hospital Stay (HOSPITAL_COMMUNITY)
Admission: RE | Admit: 2015-03-24 | Discharge: 2015-03-25 | DRG: 460 | Disposition: A | Discharge status: Home / Self Care | Payer: Managed Care, Other (non HMO) | Source: Ambulatory Visit | Attending: Neurosurgery | Admitting: Neurosurgery

## 2015-03-24 ENCOUNTER — Inpatient Hospital Stay (HOSPITAL_COMMUNITY): Payer: Managed Care, Other (non HMO)

## 2015-03-24 ENCOUNTER — Inpatient Hospital Stay (HOSPITAL_COMMUNITY): Payer: Managed Care, Other (non HMO) | Admitting: Emergency Medicine

## 2015-03-24 ENCOUNTER — Encounter (HOSPITAL_COMMUNITY)
Admission: RE | Disposition: A | Discharge status: Home / Self Care | Payer: Self-pay | Source: Ambulatory Visit | Attending: Neurosurgery

## 2015-03-24 DIAGNOSIS — Z87891 Personal history of nicotine dependence: Secondary | ICD-10-CM

## 2015-03-24 DIAGNOSIS — K219 Gastro-esophageal reflux disease without esophagitis: Secondary | ICD-10-CM | POA: Diagnosis present

## 2015-03-24 DIAGNOSIS — F329 Major depressive disorder, single episode, unspecified: Secondary | ICD-10-CM | POA: Diagnosis present

## 2015-03-24 DIAGNOSIS — M2460 Ankylosis, unspecified joint: Secondary | ICD-10-CM

## 2015-03-24 DIAGNOSIS — E785 Hyperlipidemia, unspecified: Secondary | ICD-10-CM | POA: Diagnosis present

## 2015-03-24 DIAGNOSIS — E119 Type 2 diabetes mellitus without complications: Secondary | ICD-10-CM | POA: Diagnosis present

## 2015-03-24 DIAGNOSIS — M533 Sacrococcygeal disorders, not elsewhere classified: Secondary | ICD-10-CM | POA: Diagnosis present

## 2015-03-24 DIAGNOSIS — I10 Essential (primary) hypertension: Secondary | ICD-10-CM | POA: Diagnosis present

## 2015-03-24 DIAGNOSIS — Z79899 Other long term (current) drug therapy: Secondary | ICD-10-CM | POA: Diagnosis not present

## 2015-03-24 DIAGNOSIS — F419 Anxiety disorder, unspecified: Secondary | ICD-10-CM | POA: Diagnosis present

## 2015-03-24 DIAGNOSIS — Z981 Arthrodesis status: Secondary | ICD-10-CM | POA: Diagnosis not present

## 2015-03-24 HISTORY — PX: LUMBAR FUSION: SHX111

## 2015-03-24 LAB — GLUCOSE, CAPILLARY
Glucose-Capillary: 139 mg/dL — ABNORMAL HIGH (ref 65–99)
Glucose-Capillary: 143 mg/dL — ABNORMAL HIGH (ref 65–99)
Glucose-Capillary: 277 mg/dL — ABNORMAL HIGH (ref 65–99)
Glucose-Capillary: 395 mg/dL — ABNORMAL HIGH (ref 65–99)

## 2015-03-24 SURGERY — LUMBAR FACET FUSION
Anesthesia: General | Site: Flank | Laterality: Right

## 2015-03-24 MED ORDER — ROCURONIUM BROMIDE 50 MG/5ML IV SOLN
INTRAVENOUS | Status: AC
  Filled 2015-03-24: qty 1

## 2015-03-24 MED ORDER — BISACODYL 5 MG PO TBEC
5.0000 mg | DELAYED_RELEASE_TABLET | Freq: Every day | ORAL | Status: DC | PRN
  Filled 2015-03-24: qty 1

## 2015-03-24 MED ORDER — ROCURONIUM BROMIDE 100 MG/10ML IV SOLN
INTRAVENOUS | Status: DC | PRN
  Administered 2015-03-24: 40 mg via INTRAVENOUS

## 2015-03-24 MED ORDER — NEOSTIGMINE METHYLSULFATE 10 MG/10ML IV SOLN
INTRAVENOUS | Status: DC | PRN
  Administered 2015-03-24: 4 mg via INTRAVENOUS

## 2015-03-24 MED ORDER — FENTANYL CITRATE (PF) 100 MCG/2ML IJ SOLN
INTRAMUSCULAR | Status: AC
  Filled 2015-03-24: qty 2

## 2015-03-24 MED ORDER — VANCOMYCIN HCL IN DEXTROSE 1-5 GM/200ML-% IV SOLN
1000.0000 mg | INTRAVENOUS | Status: AC
  Administered 2015-03-24: 1000 mg via INTRAVENOUS

## 2015-03-24 MED ORDER — 0.9 % SODIUM CHLORIDE (POUR BTL) OPTIME
TOPICAL | Status: DC | PRN
  Administered 2015-03-24: 1000 mL

## 2015-03-24 MED ORDER — OXYCODONE-ACETAMINOPHEN 5-325 MG PO TABS
1.0000 | ORAL_TABLET | ORAL | Status: DC | PRN
  Administered 2015-03-24: 2 via ORAL
  Administered 2015-03-24: 1 via ORAL
  Administered 2015-03-25 (×3): 2 via ORAL
  Filled 2015-03-24 (×3): qty 2
  Filled 2015-03-24: qty 1
  Filled 2015-03-24: qty 2

## 2015-03-24 MED ORDER — ACETAMINOPHEN 650 MG RE SUPP: 650.0000 mg | RECTAL | Status: DC | PRN

## 2015-03-24 MED ORDER — NEOSTIGMINE METHYLSULFATE 10 MG/10ML IV SOLN
INTRAVENOUS | Status: AC
  Filled 2015-03-24: qty 1

## 2015-03-24 MED ORDER — SODIUM CHLORIDE 0.9 % IJ SOLN
3.0000 mL | Freq: Two times a day (BID) | INTRAMUSCULAR | Status: DC
  Administered 2015-03-24 (×2): 3 mL via INTRAVENOUS

## 2015-03-24 MED ORDER — ONDANSETRON HCL 4 MG/2ML IJ SOLN: 4.0000 mg | Freq: Once | INTRAMUSCULAR | Status: DC | PRN

## 2015-03-24 MED ORDER — HYDROMORPHONE HCL 1 MG/ML IJ SOLN: 1.0000 mg | INTRAMUSCULAR | Status: DC | PRN

## 2015-03-24 MED ORDER — FLUOXETINE HCL 20 MG PO CAPS
40.0000 mg | ORAL_CAPSULE | Freq: Every day | ORAL | Status: DC
  Administered 2015-03-25: 40 mg via ORAL
  Filled 2015-03-24: qty 2

## 2015-03-24 MED ORDER — ARTIFICIAL TEARS OP OINT
TOPICAL_OINTMENT | OPHTHALMIC | Status: AC
  Filled 2015-03-24: qty 3.5

## 2015-03-24 MED ORDER — METHOCARBAMOL 1000 MG/10ML IJ SOLN: 500.0000 mg | Freq: Four times a day (QID) | INTRAVENOUS | Status: DC | PRN

## 2015-03-24 MED ORDER — SCOPOLAMINE 1 MG/3DAYS TD PT72
MEDICATED_PATCH | TRANSDERMAL | Status: AC
  Filled 2015-03-24: qty 1

## 2015-03-24 MED ORDER — ONDANSETRON HCL 4 MG/2ML IJ SOLN
INTRAMUSCULAR | Status: DC | PRN
  Administered 2015-03-24: 4 mg via INTRAVENOUS

## 2015-03-24 MED ORDER — ARTIFICIAL TEARS OP OINT
TOPICAL_OINTMENT | OPHTHALMIC | Status: DC | PRN
  Administered 2015-03-24: 1 via OPHTHALMIC

## 2015-03-24 MED ORDER — ONDANSETRON HCL 4 MG/2ML IJ SOLN: 4.0000 mg | INTRAMUSCULAR | Status: DC | PRN

## 2015-03-24 MED ORDER — INSULIN ASPART 100 UNIT/ML ~~LOC~~ SOLN
4.0000 [IU] | Freq: Three times a day (TID) | SUBCUTANEOUS | Status: DC
  Administered 2015-03-25: 4 [IU] via SUBCUTANEOUS

## 2015-03-24 MED ORDER — SODIUM CHLORIDE 0.9 % IJ SOLN: 3.0000 mL | INTRAMUSCULAR | Status: DC | PRN

## 2015-03-24 MED ORDER — PHENOL 1.4 % MT LIQD: 1.0000 | OROMUCOSAL | Status: DC | PRN

## 2015-03-24 MED ORDER — POTASSIUM CHLORIDE IN NACL 20-0.45 MEQ/L-% IV SOLN
INTRAVENOUS | Status: DC
  Filled 2015-03-24 (×2): qty 1000

## 2015-03-24 MED ORDER — MIDAZOLAM HCL 2 MG/2ML IJ SOLN
INTRAMUSCULAR | Status: AC
  Filled 2015-03-24: qty 2

## 2015-03-24 MED ORDER — METHOCARBAMOL 500 MG PO TABS
500.0000 mg | ORAL_TABLET | Freq: Four times a day (QID) | ORAL | Status: DC | PRN
  Administered 2015-03-24 – 2015-03-25 (×3): 500 mg via ORAL
  Filled 2015-03-24 (×3): qty 1

## 2015-03-24 MED ORDER — VANCOMYCIN HCL IN DEXTROSE 1-5 GM/200ML-% IV SOLN
INTRAVENOUS | Status: AC
  Filled 2015-03-24: qty 200

## 2015-03-24 MED ORDER — DEXAMETHASONE SODIUM PHOSPHATE 10 MG/ML IJ SOLN: 10.0000 mg | INTRAMUSCULAR | Status: DC

## 2015-03-24 MED ORDER — LIRAGLUTIDE 18 MG/3ML ~~LOC~~ SOPN: 1.8000 mg | PEN_INJECTOR | Freq: Every day | SUBCUTANEOUS | Status: DC

## 2015-03-24 MED ORDER — ONDANSETRON HCL 4 MG/2ML IJ SOLN
INTRAMUSCULAR | Status: AC
  Filled 2015-03-24: qty 2

## 2015-03-24 MED ORDER — GLYCOPYRROLATE 0.2 MG/ML IJ SOLN
INTRAMUSCULAR | Status: AC
  Filled 2015-03-24: qty 4

## 2015-03-24 MED ORDER — LIDOCAINE HCL (CARDIAC) 20 MG/ML IV SOLN
INTRAVENOUS | Status: AC
  Filled 2015-03-24: qty 5

## 2015-03-24 MED ORDER — DEXAMETHASONE SODIUM PHOSPHATE 10 MG/ML IJ SOLN
INTRAMUSCULAR | Status: AC
  Administered 2015-03-24: 10 mg via INTRAVENOUS
  Filled 2015-03-24: qty 1

## 2015-03-24 MED ORDER — HEMOSTATIC AGENTS (NO CHARGE) OPTIME
TOPICAL | Status: DC | PRN
  Administered 2015-03-24: 1 via TOPICAL

## 2015-03-24 MED ORDER — EPHEDRINE SULFATE 50 MG/ML IJ SOLN
INTRAMUSCULAR | Status: AC
Start: 2015-03-24 — End: 2015-03-24
  Filled 2015-03-24: qty 1

## 2015-03-24 MED ORDER — PHENYLEPHRINE HCL 10 MG/ML IJ SOLN
10.0000 mg | INTRAVENOUS | Status: DC | PRN
  Administered 2015-03-24: 30 ug/min via INTRAVENOUS

## 2015-03-24 MED ORDER — LIDOCAINE HCL (CARDIAC) 20 MG/ML IV SOLN
INTRAVENOUS | Status: DC | PRN
  Administered 2015-03-24: 80 mg via INTRAVENOUS

## 2015-03-24 MED ORDER — FENTANYL CITRATE (PF) 100 MCG/2ML IJ SOLN
25.0000 ug | INTRAMUSCULAR | Status: DC | PRN
  Administered 2015-03-24 (×2): 25 ug via INTRAVENOUS

## 2015-03-24 MED ORDER — PANTOPRAZOLE SODIUM 40 MG PO TBEC
40.0000 mg | DELAYED_RELEASE_TABLET | Freq: Every day | ORAL | Status: DC
  Administered 2015-03-24: 40 mg via ORAL
  Filled 2015-03-24: qty 1

## 2015-03-24 MED ORDER — GLYCOPYRROLATE 0.2 MG/ML IJ SOLN
INTRAMUSCULAR | Status: DC | PRN
  Administered 2015-03-24: .8 mg via INTRAVENOUS

## 2015-03-24 MED ORDER — FENTANYL CITRATE (PF) 250 MCG/5ML IJ SOLN
INTRAMUSCULAR | Status: AC
  Filled 2015-03-24: qty 5

## 2015-03-24 MED ORDER — FENTANYL CITRATE (PF) 100 MCG/2ML IJ SOLN
INTRAMUSCULAR | Status: DC | PRN
  Administered 2015-03-24: 50 ug via INTRAVENOUS

## 2015-03-24 MED ORDER — PROPOFOL 10 MG/ML IV BOLUS
INTRAVENOUS | Status: AC
  Filled 2015-03-24: qty 20

## 2015-03-24 MED ORDER — MENTHOL 3 MG MT LOZG: 1.0000 | LOZENGE | OROMUCOSAL | Status: DC | PRN

## 2015-03-24 MED ORDER — PHENYLEPHRINE HCL 10 MG/ML IJ SOLN
INTRAMUSCULAR | Status: AC
  Filled 2015-03-24: qty 1

## 2015-03-24 MED ORDER — SODIUM CHLORIDE 0.9 % IJ SOLN
INTRAMUSCULAR | Status: AC
  Filled 2015-03-24: qty 10

## 2015-03-24 MED ORDER — INSULIN ASPART 100 UNIT/ML ~~LOC~~ SOLN
0.0000 [IU] | Freq: Every day | SUBCUTANEOUS | Status: DC
  Administered 2015-03-24: 5 [IU] via SUBCUTANEOUS

## 2015-03-24 MED ORDER — PROPOFOL 10 MG/ML IV BOLUS
INTRAVENOUS | Status: DC | PRN
  Administered 2015-03-24: 150 mg via INTRAVENOUS

## 2015-03-24 MED ORDER — PANTOPRAZOLE SODIUM 40 MG IV SOLR
40.0000 mg | Freq: Every day | INTRAVENOUS | Status: DC
  Filled 2015-03-24: qty 40

## 2015-03-24 MED ORDER — HYDROCHLOROTHIAZIDE 25 MG PO TABS
25.0000 mg | ORAL_TABLET | Freq: Every day | ORAL | Status: DC
  Administered 2015-03-24 – 2015-03-25 (×2): 25 mg via ORAL
  Filled 2015-03-24 (×2): qty 1

## 2015-03-24 MED ORDER — ACETAMINOPHEN 325 MG PO TABS: 650.0000 mg | ORAL_TABLET | ORAL | Status: DC | PRN

## 2015-03-24 MED ORDER — DICYCLOMINE HCL 20 MG PO TABS
20.0000 mg | ORAL_TABLET | Freq: Two times a day (BID) | ORAL | Status: DC | PRN
  Filled 2015-03-24: qty 1

## 2015-03-24 MED ORDER — SODIUM CHLORIDE 0.9 % IV SOLN
1250.0000 mg | Freq: Once | INTRAVENOUS | Status: AC
  Administered 2015-03-24: 1250 mg via INTRAVENOUS
  Filled 2015-03-24: qty 1250

## 2015-03-24 MED ORDER — BUPIVACAINE HCL (PF) 0.25 % IJ SOLN
INTRAMUSCULAR | Status: DC | PRN
  Administered 2015-03-24: 10 mL

## 2015-03-24 MED ORDER — LORAZEPAM 0.5 MG PO TABS: 0.5000 mg | ORAL_TABLET | Freq: Every evening | ORAL | Status: DC | PRN

## 2015-03-24 MED ORDER — MIDAZOLAM HCL 5 MG/5ML IJ SOLN
INTRAMUSCULAR | Status: DC | PRN
  Administered 2015-03-24: 1 mg via INTRAVENOUS

## 2015-03-24 MED ORDER — FLUOXETINE HCL 40 MG PO CAPS: 40.0000 mg | ORAL_CAPSULE | Freq: Every day | ORAL | Status: DC

## 2015-03-24 MED ORDER — AMLODIPINE BESYLATE 5 MG PO TABS
5.0000 mg | ORAL_TABLET | Freq: Every day | ORAL | Status: DC
  Administered 2015-03-25: 5 mg via ORAL
  Filled 2015-03-24: qty 1

## 2015-03-24 MED ORDER — INSULIN ASPART 100 UNIT/ML ~~LOC~~ SOLN
0.0000 [IU] | Freq: Three times a day (TID) | SUBCUTANEOUS | Status: DC
  Administered 2015-03-24: 8 [IU] via SUBCUTANEOUS
  Administered 2015-03-25: 5 [IU] via SUBCUTANEOUS

## 2015-03-24 MED ORDER — SODIUM CHLORIDE 0.9 % IR SOLN
Status: DC | PRN
  Administered 2015-03-24: 500 mL

## 2015-03-24 MED ORDER — THROMBIN 5000 UNITS EX SOLR
CUTANEOUS | Status: DC | PRN
  Administered 2015-03-24 (×2): 5000 [IU] via TOPICAL

## 2015-03-24 MED ORDER — SODIUM CHLORIDE 0.9 % IV SOLN: 250.0000 mL | INTRAVENOUS | Status: DC

## 2015-03-24 MED ORDER — DOCUSATE SODIUM 100 MG PO CAPS
100.0000 mg | ORAL_CAPSULE | Freq: Two times a day (BID) | ORAL | Status: DC
  Administered 2015-03-24 – 2015-03-25 (×2): 100 mg via ORAL
  Filled 2015-03-24: qty 1

## 2015-03-24 MED ORDER — PHENYLEPHRINE HCL 10 MG/ML IJ SOLN
INTRAMUSCULAR | Status: DC | PRN
  Administered 2015-03-24: 40 ug via INTRAVENOUS
  Administered 2015-03-24: 80 ug via INTRAVENOUS
  Administered 2015-03-24: 40 ug via INTRAVENOUS

## 2015-03-24 MED ORDER — LACTATED RINGERS IV SOLN
INTRAVENOUS | Status: DC | PRN
  Administered 2015-03-24: 11:00:00 via INTRAVENOUS

## 2015-03-24 SURGICAL SUPPLY — 57 items
APL SKNCLS STERI-STRIP NONHPOA (GAUZE/BANDAGES/DRESSINGS) ×1
BAG DECANTER FOR FLEXI CONT (MISCELLANEOUS) ×3 IMPLANT
BENZOIN TINCTURE PRP APPL 2/3 (GAUZE/BANDAGES/DRESSINGS) ×4 IMPLANT
BLADE CLIPPER SURG (BLADE) IMPLANT
BONE EQUIVA 10CC (Bone Implant) ×2 IMPLANT
BRUSH SCRUB EZ PLAIN DRY (MISCELLANEOUS) ×3 IMPLANT
CLOSURE WOUND 1/2 X4 (GAUZE/BANDAGES/DRESSINGS) ×1
CONT SPEC 4OZ CLIKSEAL STRL BL (MISCELLANEOUS) IMPLANT
COVER BACK TABLE 24X17X13 BIG (DRAPES) IMPLANT
COVER BACK TABLE 60X90IN (DRAPES) ×3 IMPLANT
DRAPE C-ARM 42X72 X-RAY (DRAPES) ×3 IMPLANT
DRAPE C-ARMOR (DRAPES) ×3 IMPLANT
DRAPE LAPAROTOMY 100X72X124 (DRAPES) ×3 IMPLANT
DRAPE SURG 17X23 STRL (DRAPES) ×6 IMPLANT
DRSG OPSITE POSTOP 4X6 (GAUZE/BANDAGES/DRESSINGS) ×2 IMPLANT
DRSG TELFA 3X8 NADH (GAUZE/BANDAGES/DRESSINGS) ×3 IMPLANT
ELECT BLADE 4.0 EZ CLEAN MEGAD (MISCELLANEOUS) ×3
ELECT REM PT RETURN 9FT ADLT (ELECTROSURGICAL) ×3
ELECTRODE BLDE 4.0 EZ CLN MEGD (MISCELLANEOUS) ×1 IMPLANT
ELECTRODE REM PT RTRN 9FT ADLT (ELECTROSURGICAL) ×1 IMPLANT
GAUZE SPONGE 4X4 12PLY STRL (GAUZE/BANDAGES/DRESSINGS) ×3 IMPLANT
GAUZE SPONGE 4X4 16PLY XRAY LF (GAUZE/BANDAGES/DRESSINGS) IMPLANT
GLOVE BIOGEL PI IND STRL 7.0 (GLOVE) IMPLANT
GLOVE BIOGEL PI INDICATOR 7.0 (GLOVE) ×2
GLOVE ECLIPSE 8.0 STRL XLNG CF (GLOVE) ×3 IMPLANT
GLOVE EXAM NITRILE LRG STRL (GLOVE) IMPLANT
GLOVE EXAM NITRILE XL STR (GLOVE) IMPLANT
GLOVE EXAM NITRILE XS STR PU (GLOVE) IMPLANT
GLOVE SS N UNI LF 7.0 STRL (GLOVE) ×4 IMPLANT
GOWN STRL REUS W/ TWL LRG LVL3 (GOWN DISPOSABLE) ×1 IMPLANT
GOWN STRL REUS W/ TWL XL LVL3 (GOWN DISPOSABLE) IMPLANT
GOWN STRL REUS W/TWL 2XL LVL3 (GOWN DISPOSABLE) ×3 IMPLANT
GOWN STRL REUS W/TWL LRG LVL3 (GOWN DISPOSABLE) ×3
GOWN STRL REUS W/TWL XL LVL3 (GOWN DISPOSABLE)
KIT BASIN OR (CUSTOM PROCEDURE TRAY) ×3 IMPLANT
KIT ROOM TURNOVER OR (KITS) ×3 IMPLANT
LIQUID BAND (GAUZE/BANDAGES/DRESSINGS) ×3 IMPLANT
NEEDLE HYPO 22GX1.5 SAFETY (NEEDLE) ×3 IMPLANT
NS IRRIG 1000ML POUR BTL (IV SOLUTION) ×3 IMPLANT
PACK LAMINECTOMY NEURO (CUSTOM PROCEDURE TRAY) ×3 IMPLANT
PAD DRESSING TELFA 3X8 NADH (GAUZE/BANDAGES/DRESSINGS) ×1 IMPLANT
PIN EXCHANGE 500MM (PIN) ×2 IMPLANT
PIN STEINMAN BLUNT 2.4X300MM (PIN) ×2 IMPLANT
PIN STEINMAN TROCAR 300MM (PIN) ×6 IMPLANT
SCREW CANN FUS SI JT 12.5X50 (Screw) ×4 IMPLANT
SCREW CANN SI JT FUS 7X45 (Screw) ×2 IMPLANT
SPONGE LAP 4X18 X RAY DECT (DISPOSABLE) IMPLANT
SPONGE SURGIFOAM ABS GEL SZ50 (HEMOSTASIS) IMPLANT
STRIP CLOSURE SKIN 1/2X4 (GAUZE/BANDAGES/DRESSINGS) ×2 IMPLANT
SUT VIC AB 2-0 OS6 18 (SUTURE) ×15 IMPLANT
SUT VIC AB 3-0 CP2 18 (SUTURE) ×7 IMPLANT
SYR 20ML ECCENTRIC (SYRINGE) ×3 IMPLANT
TAPE STRIPS DRAPE STRL (GAUZE/BANDAGES/DRESSINGS) ×2 IMPLANT
TOWEL OR 17X24 6PK STRL BLUE (TOWEL DISPOSABLE) ×3 IMPLANT
TOWEL OR 17X26 10 PK STRL BLUE (TOWEL DISPOSABLE) ×3 IMPLANT
TRAY FOLEY CATH 14FRSI W/METER (CATHETERS) IMPLANT
WATER STERILE IRR 1000ML POUR (IV SOLUTION) ×3 IMPLANT

## 2015-03-24 NOTE — Anesthesia Procedure Notes (Signed)
Procedure Name: Intubation Date/Time: 03/24/2015 11:28 AM Performed by: Sampson Si E Pre-anesthesia Checklist: Patient identified, Emergency Drugs available, Suction available, Patient being monitored and Timeout performed Patient Re-evaluated:Patient Re-evaluated prior to inductionOxygen Delivery Method: Circle system utilized Preoxygenation: Pre-oxygenation with 100% oxygen Intubation Type: IV induction Ventilation: Mask ventilation without difficulty Laryngoscope Size: Mac and 3 Grade View: Grade I Tube type: Oral Tube size: 7.0 mm Number of attempts: 1 Airway Equipment and Method: Stylet Placement Confirmation: ETT inserted through vocal cords under direct vision,  positive ETCO2 and breath sounds checked- equal and bilateral Secured at: 21 cm Tube secured with: Tape Dental Injury: Teeth and Oropharynx as per pre-operative assessment

## 2015-03-24 NOTE — H&P (Signed)
[name] is an 54 y.o. female.   Chief Complaint: Right SI joint dysfunction HPI: The patient is a 54 year old female who is had numerous low back fusions. She also had a spinal stimulator placed a number of years ago which worked temporarily was eventually removed. She is now been seeing pain management for pain in her SI joint particular on the right side. She is had numerous injections which did give her temporary relief but nothing sustained. She also had radiofrequency ablation which did help somewhat on the left but not on the right. The patient was evaluated and discussion of SI joint fusion was carried out. The patient wanted to proceed in that direction and now comes for a right SI joint fusion. I've had a long discussion with her regarding the risks and benefits of surgical intervention. The risks discussed include but are not limited to bleeding and infection weakness and numbness. She's had the opportunity to ask numerous questions and appears to understand. With this information in hand she has requested we proceed with surgery she is admitted this time for her procedure.  Past Medical History  Diagnosis Date  . Interstitial cystitis   . Hyperlipidemia     hasn't been on Pravastatin in 6-7wks   . Anxiety   . Barrett esophagus     Dr. Olevia Perches in GI  . GERD (gastroesophageal reflux disease)     takes Nexium daily  . Muscle spasm     takes Ativan nightly as needed  . PONV (postoperative nausea and vomiting)   . Pneumonia     fall 2014  . Numbness     weakness in right leg  . DDD (degenerative disc disease)     s/p back surgeries and steroid injxns  . Joint pain   . Joint swelling   . Chronic back pain     stenosis and DDD  . Urinary frequency   . History of kidney stones   . Hypertension     takes Amlodipine and HCTZ daily  . Depression     takes Fluoxetine daily  . Spinal headache     blood patch required   . Diabetes mellitus     Victoza daily    Past  Surgical History  Procedure Laterality Date  . Neck surgery  12/2007  . Appendectomy    . Total abdominal hysterectomy    . Cholecystectomy    . Colonoscopy  2011  . Upper gi endoscopy    . Cesarean section  1988/1989  . Oophorectomy    . Adhesions removed from abdomen    . Knee arthroscopy Bilateral   . Epidural injections    . Laparoscopy N/A 04/15/2014    Procedure: LAPAROSCOPY DIAGNOSTIC;  Surgeon: Joyice Faster. Cornett, MD;  Location: [place];  Service: General;  Laterality: N/A;  . Laparoscopic lysis of adhesions N/A 04/15/2014    Procedure: LAPAROSCOPIC LYSIS OF ADHESION;  Surgeon: Joyice Faster. Cornett, MD;  Location: [place];  Service: General;  Laterality: N/A;  . Left heart catheterization with coronary angiogram N/A 02/03/2012    Procedure: LEFT HEART CATHETERIZATION WITH CORONARY ANGIOGRAM;  Surgeon: Troy Sine, MD;  Location: [facility] CATH LAB;  Service: Cardiovascular;  Laterality: N/A;  . Back surgery  12/2007    x 6-fusion x 2    Family History  Problem Relation Age of Onset  . Hypertension Father   . Heart disease Mother     ischemic  . Allergies Sister   . Asthma Sister   .  Gallbladder disease Mother   . Colon polyps Father   . Other Mother     rectovaginal fistula   Social History:  reports that she has quit smoking. Her smoking use included Cigarettes. She has a 5.1 pack-year smoking history. She has never used smokeless tobacco. She reports that she drinks alcohol. She reports that she does not use illicit drugs.  Allergies:  Allergies  Allergen Reactions  . Latex Anaphylaxis  . Penicillins Anaphylaxis  . Erythromycin Hives and Itching  . Ofloxacin Hives and Itching  . Sulfonamide Derivatives Hives and Itching  . Azithromycin     "makes my heart rate go crazy"    Medications Prior to Admission  Medication Sig Dispense Refill  . ACCU-CHEK AVIVA PLUS test strip TEST 3 TIMES DAILY 100 strip 5  . amLODipine (NORVASC) 5 MG tablet Take 5 mg by mouth daily.    Marland Kitchen  dicyclomine (BENTYL) 20 MG tablet Take 20 mg by mouth 2 (two) times daily as needed (for abdominal pain).     Marland Kitchen esomeprazole (NEXIUM) 40 MG capsule Take 1 capsule (40 mg total) by mouth daily before breakfast. 90 capsule 1  . FLUoxetine (PROZAC) 40 MG capsule Take 1 capsule (40 mg total) by mouth daily. NEEDS APPT FOR REFILLS 90 capsule 0  . hydrochlorothiazide (HYDRODIURIL) 25 MG tablet Take 1 tablet (25 mg total) by mouth daily. NEEDS APPT FOR REFILLS 90 tablet 0  . HYDROcodone-acetaminophen (NORCO) 10-325 MG per tablet Take 1 tablet by mouth 2 (two) times daily as needed for pain.    . Liraglutide (VICTOZA) 18 MG/3ML SOPN Inject 1.8 mg into the skin daily.    Marland Kitchen LORazepam (ATIVAN) 1 MG tablet Take 0.5 mg by mouth at bedtime as needed (muscle spasms).    . pravastatin (PRAVACHOL) 20 MG tablet Take 20 mg by mouth daily.    . traMADol (ULTRAM) 50 MG tablet Take 50 mg by mouth 2 (two) times daily as needed for pain.     Marland Kitchen oxyCODONE-acetaminophen (ROXICET) 5-325 MG per tablet Take 1 tablet by mouth every 4 (four) hours as needed. (Patient not taking: Reported on 09/14/2014) 30 tablet 0  . potassium chloride (K-DUR) 10 MEQ tablet Take 1 tablet (10 mEq total) by mouth daily. (Patient not taking: Reported on 09/14/2014) 6 tablet 0    Results for orders placed or performed during the hospital encounter of 03/24/15 (from the past 48 hour(s))  Glucose, capillary     Status: Abnormal   Collection Time: 03/24/15  9:59 AM  Result Value Ref Range   Glucose-Capillary 139 (H) 65 - 99 mg/dL   No results found.  Back pain leg pain SI joint pain cystitis  Blood pressure 140/80, pulse 78, temperature 98.1 F (36.7 C), temperature source Oral, resp. rate 20, weight 96.163 kg (212 lb), SpO2 97 %.  The patient is awake alert and oriented. She is no facial asymmetry. Her gait is mildly antalgic. She is markedly positive provocative testing for the right SI joint and marked tenderness in that area as well.  Reflexes are decreased but equal her strength is intact  Assessment/Plan Impression is that of right SI joint dysfunction and the patient with previous low back fusion. She has a consistent physical exam as well as good temporary response to injections. The plan is for a right SI joint fusion at this time.  Faythe Ghee, MD 03/24/2015, 10:55 AM

## 2015-03-24 NOTE — Transfer of Care (Signed)
Immediate Anesthesia Transfer of Care Note  Patient: [name]  Procedure(s) Performed: Procedure(s) with comments: SI joint fusion - right  (Right) - SI joint fusion - right   Patient Location: PACU  Anesthesia Type:General  Level of Consciousness: lethargic and responds to stimulation  Airway & Oxygen Therapy: Patient Spontanous Breathing and Patient connected to nasal cannula oxygen  Post-op Assessment: Report given to RN  Post vital signs: Reviewed and stable  Last Vitals:  Filed Vitals:   03/24/15 0953  BP: 140/80  Pulse: 78  Temp: 36.7 C  Resp: 20    Complications: No apparent anesthesia complications

## 2015-03-24 NOTE — Anesthesia Postprocedure Evaluation (Signed)
  Anesthesia Post-op Note  Patient: [name]  Procedure(s) Performed: Procedure(s) (LRB): SI joint fusion - right  (Right)  Patient Location: PACU  Anesthesia Type: General  Level of Consciousness: awake and alert   Airway and Oxygen Therapy: Patient Spontanous Breathing  Post-op Pain: mild  Post-op Assessment: Post-op Vital signs reviewed, Patient's Cardiovascular Status Stable, Respiratory Function Stable, Patent Airway and No signs of Nausea or vomiting  Last Vitals:  Filed Vitals:   03/24/15 1445  BP:   Pulse: 79  Temp: 36.2 C  Resp: 16    Post-op Vital Signs: stable   Complications: No apparent anesthesia complications

## 2015-03-24 NOTE — Anesthesia Preprocedure Evaluation (Addendum)
Anesthesia Evaluation  Patient identified by MRN, date of birth, ID band Patient awake    Reviewed: Allergy & Precautions, NPO status , Patient's Chart, lab work & pertinent test results  History of Anesthesia Complications (+) PONV, POST - OP SPINAL HEADACHE and history of anesthetic complications  Airway Mallampati: II  TM Distance: >3 FB Neck ROM: Full    Dental no notable dental hx. (+) Dental Advisory Given, Teeth Intact   Pulmonary pneumonia -, resolved, former smoker,  breath sounds clear to auscultation  Pulmonary exam normal       Cardiovascular hypertension, Pt. on medications Normal cardiovascular examRhythm:Regular Rate:Normal     Neuro/Psych  Headaches, PSYCHIATRIC DISORDERS Anxiety Depression    GI/Hepatic Neg liver ROS, GERD-  Medicated and Controlled,  Endo/Other  diabetes, Well Controlled, Type 2, Oral Hypoglycemic Agents  Renal/GU negative Renal ROS  negative genitourinary   Musculoskeletal  (+) Arthritis -, Osteoarthritis,    Abdominal   Peds negative pediatric ROS (+)  Hematology negative hematology ROS (+)   Anesthesia Other Findings   Reproductive/Obstetrics negative OB ROS                           Anesthesia Physical Anesthesia Plan  ASA: II  Anesthesia Plan: General   Post-op Pain Management:    Induction: Intravenous  Airway Management Planned: Oral ETT  Additional Equipment:   Intra-op Plan:   Post-operative Plan: Extubation in OR  Informed Consent: I have reviewed the patients History and Physical, chart, labs and discussed the procedure including the risks, benefits and alternatives for the proposed anesthesia with the patient or authorized representative who has indicated his/her understanding and acceptance.   Dental advisory given  Plan Discussed with: CRNA  Anesthesia Plan Comments:         Anesthesia Quick Evaluation

## 2015-03-24 NOTE — Op Note (Signed)
Preop diagnosis: Right sacroiliac joint dysfunction Postop diagnosis: Same Procedure: Right sacroiliac joint fusion with Zimmer TriCor system Surgeon: Karriem Muench Asst.: Nundkumar  After being placed in the prone position the patient's right lateral buttock was prepped and draped in the usual sterile fashion. Using fluoroscopy the posterior sacral line and the A1 right identified. A linear incision was made 1 cm below the annular line 1 cm posterior to the alar line. We passed the most cranial guidewire first and followed it into excellent position. We then did sequential dilation and then removed the 2 dilators and left the largest one in place. We drilled over the Steinmann pin and tapped his well being careful not to advance the pin. We did exchange out for a blunt tipped pin. We placed a 50 mm bolts at this level. It was filled with a mixture of autologous bone and morselized allograft. We then used our guide to obtain a new 0.20 mm from the first and placed our Steinmann pin in that position. We once again did drilling and tapping and placed a similar length bolt. The third smaller bolt was placed in standard fashion the we only dilated up with the 2 smaller dilators. We used a 40 mm bolt in this case. This was also followed in excellent position. Final fluoroscopy in all 3 planes looked excellent and we irrigated the wound copiously and closed it with 20 and 30 Vicryls. Dermabond and Steri-Strips were placed on the skin. A sterile dressing was then applied and the patient was extubated and taken to recovery room in stable condition.

## 2015-03-24 NOTE — Progress Notes (Signed)
Orthopedic Tech Progress Note Patient Details:  [name] 1960/12/28 [id number]  Ortho Devices Type of Ortho Device: Crutches Ortho Device/Splint Interventions: Application Viewed order from doctor's order list  Hildred Priest 03/24/2015, 2:59 PM

## 2015-03-25 LAB — GLUCOSE, CAPILLARY: GLUCOSE-CAPILLARY: 230 mg/dL — AB (ref 65–99)

## 2015-03-25 NOTE — Progress Notes (Signed)
Inpatient Diabetes Program Recommendations  AACE/ADA: New Consensus Statement on Inpatient Glycemic Control (2013)  Target Ranges:  Prepandial:   less than 140 mg/dL      Peak postprandial:   less than 180 mg/dL (1-2 hours)      Critically ill patients:  140 - 180 mg/dL  Results for [name], [name] (MRN [id number]) as of 03/25/2015 09:36  Ref. Range 03/24/2015 09:59 03/24/2015 13:14 03/24/2015 17:29 03/24/2015 21:25 03/25/2015 07:27  Glucose-Capillary Latest Ref Range: 65-99 mg/dL 139 (H) 143 (H) 277 (H) 395 (H) 230 (H)   Reason for Visit: Right SI joint fusion   Diabetes history: DM 2 Outpatient Diabetes medications: Vicotza 1.8mg  Daily Current orders for Inpatient glycemic control: Novolog 0-15 units TID, Novolog 0-5 units QHS, Novolog 4 units TID, Victoza 1.8mg  daily   Inpatient Diabetes Program Recommendations Patient received 10 mg Decadron Insulin - Basal: Glucose in the 200's, while inpatient please consider ordering Lantus 8 units Q24hrs to get glucose levels down to inpatient goal. As steroids wear off will need to decrease basal.  Thanks,  Tama Headings RN, MSN, [facility] Inpatient Diabetes Coordinator Team Pager [phone]

## 2015-03-25 NOTE — Progress Notes (Signed)
Discharge instructions given. Pt verbalized understanding and all questions were answered.  

## 2015-03-25 NOTE — Discharge Summary (Signed)
  Physician Discharge Summary  Patient ID: [name] MRN: [id number] DOB/AGE: [birth year] 54 y.o.  Admit date: 03/24/2015 Discharge date: 03/25/2015  Admission Diagnoses:  Discharge Diagnoses:  Active Problems:   Sacroiliac dysfunction   Discharged Condition: good  Hospital Course: Surgery yesterday for right SI joint fusion. Did great with marked improvement in her pain. Wound clean and dry. Using crutches well. Home pod 1, specific instructions given.  Consults: None  Significant Diagnostic Studies: none  Treatments: surgery: right SI joint fusion  Discharge Exam: Blood pressure 122/60, pulse 65, temperature 98.9 F (37.2 C), temperature source Oral, resp. rate 18, weight 96.163 kg (212 lb), SpO2 99 %. Incision/Wound:clean and dry;  Disposition: 01-Home or Self Care     Medication List    ASK your doctor about these medications        ACCU-CHEK AVIVA PLUS test strip  Generic drug:  glucose blood  TEST 3 TIMES DAILY     amLODipine 5 MG tablet  Commonly known as:  NORVASC  Take 5 mg by mouth daily.     dicyclomine 20 MG tablet  Commonly known as:  BENTYL  Take 20 mg by mouth 2 (two) times daily as needed (for abdominal pain).     esomeprazole 40 MG capsule  Commonly known as:  NEXIUM  Take 1 capsule (40 mg total) by mouth daily before breakfast.     FLUoxetine 40 MG capsule  Commonly known as:  PROZAC  Take 1 capsule (40 mg total) by mouth daily. NEEDS APPT FOR REFILLS     hydrochlorothiazide 25 MG tablet  Commonly known as:  HYDRODIURIL  Take 1 tablet (25 mg total) by mouth daily. NEEDS APPT FOR REFILLS     HYDROcodone-acetaminophen 10-325 MG per tablet  Commonly known as:  NORCO  Take 1 tablet by mouth 2 (two) times daily as needed for pain.     LORazepam 1 MG tablet  Commonly known as:  ATIVAN  Take 0.5 mg by mouth at bedtime as needed (muscle spasms).     oxyCODONE-acetaminophen 5-325 MG per tablet  Commonly known as:  ROXICET  Take 1  tablet by mouth every 4 (four) hours as needed.     potassium chloride 10 MEQ tablet  Commonly known as:  K-DUR  Take 1 tablet (10 mEq total) by mouth daily.     pravastatin 20 MG tablet  Commonly known as:  PRAVACHOL  Take 20 mg by mouth daily.     traMADol 50 MG tablet  Commonly known as:  ULTRAM  Take 50 mg by mouth 2 (two) times daily as needed for pain.     VICTOZA 18 MG/3ML Sopn  Generic drug:  Liraglutide  Inject 1.8 mg into the skin daily.         At home rest most of the time. Get up 9 or 10 times each day and take a 15 or 20 minute walk. No riding in the car and to your first postoperative appointment. If you have neck surgery you may shower from the chest down starting on the third postoperative day. If you had back surgery he may start showering on the third postoperative day with saran wrap wrapped around your incisional area 3 times. After the shower remove the saran wrap. Take pain medicine as needed and other medications as instructed. Call my office for an appointment.  SignedFaythe Ghee, MD 03/25/2015, 11:57 AM

## 2015-03-31 ENCOUNTER — Encounter (HOSPITAL_COMMUNITY): Payer: Self-pay | Admitting: Neurosurgery

## 2015-04-02 ENCOUNTER — Encounter (HOSPITAL_COMMUNITY): Payer: Self-pay | Admitting: Neurosurgery

## 2015-04-03 ENCOUNTER — Encounter (HOSPITAL_COMMUNITY): Payer: Self-pay | Admitting: Neurosurgery

## 2015-04-07 ENCOUNTER — Encounter (HOSPITAL_COMMUNITY): Payer: Self-pay | Admitting: Neurosurgery

## 2015-04-08 ENCOUNTER — Encounter (HOSPITAL_COMMUNITY): Payer: Self-pay | Admitting: Neurosurgery

## 2015-04-22 ENCOUNTER — Encounter: Payer: Self-pay | Admitting: Internal Medicine

## 2015-04-27 ENCOUNTER — Other Ambulatory Visit: Payer: Self-pay

## 2015-06-15 ENCOUNTER — Other Ambulatory Visit: Payer: Self-pay | Admitting: Gynecology

## 2015-06-16 LAB — CYTOLOGY - PAP

## 2015-10-16 ENCOUNTER — Other Ambulatory Visit: Payer: Self-pay | Admitting: Neurosurgery

## 2015-10-16 DIAGNOSIS — M5417 Radiculopathy, lumbosacral region: Secondary | ICD-10-CM

## 2015-10-16 DIAGNOSIS — M461 Sacroiliitis, not elsewhere classified: Secondary | ICD-10-CM

## 2015-11-03 ENCOUNTER — Ambulatory Visit
Admission: RE | Admit: 2015-11-03 | Discharge: 2015-11-03 | Disposition: A | Discharge status: Home / Self Care | Payer: Managed Care, Other (non HMO) | Source: Ambulatory Visit | Attending: Neurosurgery | Admitting: Neurosurgery

## 2015-11-03 DIAGNOSIS — M5417 Radiculopathy, lumbosacral region: Secondary | ICD-10-CM

## 2015-11-03 DIAGNOSIS — M461 Sacroiliitis, not elsewhere classified: Secondary | ICD-10-CM

## 2015-11-03 MED ORDER — GADOBENATE DIMEGLUMINE 529 MG/ML IV SOLN
17.0000 mL | Freq: Once | INTRAVENOUS | Status: AC | PRN
  Administered 2015-11-03: 17 mL via INTRAVENOUS

## 2016-01-28 ENCOUNTER — Other Ambulatory Visit: Payer: Self-pay | Admitting: Neurosurgery

## 2016-01-28 DIAGNOSIS — M461 Sacroiliitis, not elsewhere classified: Secondary | ICD-10-CM

## 2016-02-03 ENCOUNTER — Ambulatory Visit
Admission: RE | Admit: 2016-02-03 | Discharge: 2016-02-03 | Disposition: A | Discharge status: Home / Self Care | Payer: Managed Care, Other (non HMO) | Source: Ambulatory Visit | Attending: Neurosurgery | Admitting: Neurosurgery

## 2016-02-03 DIAGNOSIS — M461 Sacroiliitis, not elsewhere classified: Secondary | ICD-10-CM

## 2016-03-17 ENCOUNTER — Emergency Department (HOSPITAL_COMMUNITY): Payer: Managed Care, Other (non HMO)

## 2016-03-17 ENCOUNTER — Encounter (HOSPITAL_COMMUNITY): Payer: Self-pay

## 2016-03-17 ENCOUNTER — Emergency Department (HOSPITAL_COMMUNITY)
Admission: EM | Admit: 2016-03-17 | Discharge: 2016-03-17 | Disposition: A | Discharge status: Home / Self Care | Payer: Managed Care, Other (non HMO) | Attending: Emergency Medicine | Admitting: Emergency Medicine

## 2016-03-17 DIAGNOSIS — N12 Tubulo-interstitial nephritis, not specified as acute or chronic: Secondary | ICD-10-CM | POA: Diagnosis not present

## 2016-03-17 DIAGNOSIS — Z9889 Other specified postprocedural states: Secondary | ICD-10-CM | POA: Insufficient documentation

## 2016-03-17 DIAGNOSIS — Z91040 Latex allergy status: Secondary | ICD-10-CM | POA: Diagnosis not present

## 2016-03-17 DIAGNOSIS — F419 Anxiety disorder, unspecified: Secondary | ICD-10-CM | POA: Insufficient documentation

## 2016-03-17 DIAGNOSIS — Z79899 Other long term (current) drug therapy: Secondary | ICD-10-CM | POA: Diagnosis not present

## 2016-03-17 DIAGNOSIS — Z8701 Personal history of pneumonia (recurrent): Secondary | ICD-10-CM | POA: Diagnosis not present

## 2016-03-17 DIAGNOSIS — Z9049 Acquired absence of other specified parts of digestive tract: Secondary | ICD-10-CM | POA: Diagnosis not present

## 2016-03-17 DIAGNOSIS — Z87891 Personal history of nicotine dependence: Secondary | ICD-10-CM | POA: Insufficient documentation

## 2016-03-17 DIAGNOSIS — F329 Major depressive disorder, single episode, unspecified: Secondary | ICD-10-CM | POA: Insufficient documentation

## 2016-03-17 DIAGNOSIS — G8929 Other chronic pain: Secondary | ICD-10-CM | POA: Insufficient documentation

## 2016-03-17 DIAGNOSIS — E119 Type 2 diabetes mellitus without complications: Secondary | ICD-10-CM | POA: Diagnosis not present

## 2016-03-17 DIAGNOSIS — Z880 Allergy status to penicillin: Secondary | ICD-10-CM | POA: Diagnosis not present

## 2016-03-17 DIAGNOSIS — Z90710 Acquired absence of both cervix and uterus: Secondary | ICD-10-CM | POA: Diagnosis not present

## 2016-03-17 DIAGNOSIS — K219 Gastro-esophageal reflux disease without esophagitis: Secondary | ICD-10-CM | POA: Diagnosis not present

## 2016-03-17 DIAGNOSIS — R1031 Right lower quadrant pain: Secondary | ICD-10-CM | POA: Diagnosis present

## 2016-03-17 DIAGNOSIS — I10 Essential (primary) hypertension: Secondary | ICD-10-CM | POA: Diagnosis not present

## 2016-03-17 DIAGNOSIS — Z794 Long term (current) use of insulin: Secondary | ICD-10-CM | POA: Diagnosis not present

## 2016-03-17 DIAGNOSIS — E785 Hyperlipidemia, unspecified: Secondary | ICD-10-CM | POA: Diagnosis not present

## 2016-03-17 DIAGNOSIS — Z87442 Personal history of urinary calculi: Secondary | ICD-10-CM | POA: Insufficient documentation

## 2016-03-17 LAB — URINALYSIS, ROUTINE W REFLEX MICROSCOPIC
GLUCOSE, UA: 100 mg/dL — AB
Ketones, ur: 15 mg/dL — AB
NITRITE: NEGATIVE
PH: 5 (ref 5.0–8.0)
Protein, ur: 100 mg/dL — AB
SPECIFIC GRAVITY, URINE: 1.036 — AB (ref 1.005–1.030)

## 2016-03-17 LAB — CBC WITH DIFFERENTIAL/PLATELET
Basophils Absolute: 0 10*3/uL (ref 0.0–0.1)
Basophils Relative: 0 %
Eosinophils Absolute: 0.1 10*3/uL (ref 0.0–0.7)
Eosinophils Relative: 1 %
HCT: 42.2 % (ref 36.0–46.0)
HEMOGLOBIN: 14.5 g/dL (ref 12.0–15.0)
LYMPHS ABS: 2.4 10*3/uL (ref 0.7–4.0)
LYMPHS PCT: 22 %
MCH: 31.7 pg (ref 26.0–34.0)
MCHC: 34.4 g/dL (ref 30.0–36.0)
MCV: 92.1 fL (ref 78.0–100.0)
MONOS PCT: 7 %
Monocytes Absolute: 0.8 10*3/uL (ref 0.1–1.0)
NEUTROS PCT: 70 %
Neutro Abs: 7.9 10*3/uL — ABNORMAL HIGH (ref 1.7–7.7)
Platelets: 346 10*3/uL (ref 150–400)
RBC: 4.58 MIL/uL (ref 3.87–5.11)
RDW: 12.5 % (ref 11.5–15.5)
WBC: 11.2 10*3/uL — AB (ref 4.0–10.5)

## 2016-03-17 LAB — URINE MICROSCOPIC-ADD ON

## 2016-03-17 LAB — COMPREHENSIVE METABOLIC PANEL
ALK PHOS: 91 U/L (ref 38–126)
ALT: 48 U/L (ref 14–54)
ANION GAP: 12 (ref 5–15)
AST: 23 U/L (ref 15–41)
Albumin: 3.7 g/dL (ref 3.5–5.0)
BILIRUBIN TOTAL: 0.6 mg/dL (ref 0.3–1.2)
BUN: 15 mg/dL (ref 6–20)
CALCIUM: 9.2 mg/dL (ref 8.9–10.3)
CO2: 28 mmol/L (ref 22–32)
Chloride: 100 mmol/L — ABNORMAL LOW (ref 101–111)
Creatinine, Ser: 0.74 mg/dL (ref 0.44–1.00)
GFR calc non Af Amer: >60 mL/min (ref >60)
Glucose, Bld: 124 mg/dL — ABNORMAL HIGH (ref 65–99)
POTASSIUM: 3.3 mmol/L — AB (ref 3.5–5.1)
SODIUM: 140 mmol/L (ref 135–145)
TOTAL PROTEIN: 6.4 g/dL — AB (ref 6.5–8.1)

## 2016-03-17 LAB — LIPASE, BLOOD: Lipase: 25 U/L (ref 11–51)

## 2016-03-17 MED ORDER — SODIUM CHLORIDE 0.9 % IV BOLUS (SEPSIS)
1000.0000 mL | Freq: Once | INTRAVENOUS | Status: AC
  Administered 2016-03-17: 1000 mL via INTRAVENOUS

## 2016-03-17 MED ORDER — KETOROLAC TROMETHAMINE 30 MG/ML IJ SOLN
30.0000 mg | Freq: Once | INTRAMUSCULAR | Status: AC
  Administered 2016-03-17: 30 mg via INTRAVENOUS
  Filled 2016-03-17: qty 1

## 2016-03-17 MED ORDER — ONDANSETRON HCL 4 MG PO TABS: 4.0000 mg | ORAL_TABLET | Freq: Four times a day (QID) | ORAL | Status: DC

## 2016-03-17 MED ORDER — ONDANSETRON HCL 4 MG/2ML IJ SOLN
4.0000 mg | Freq: Once | INTRAMUSCULAR | Status: AC
  Administered 2016-03-17: 4 mg via INTRAVENOUS
  Filled 2016-03-17: qty 2

## 2016-03-17 MED ORDER — CEFPODOXIME PROXETIL 200 MG PO TABS: 200.0000 mg | ORAL_TABLET | Freq: Two times a day (BID) | ORAL | Status: DC

## 2016-03-17 MED ORDER — HYDROMORPHONE HCL 1 MG/ML IJ SOLN
0.5000 mg | Freq: Once | INTRAMUSCULAR | Status: DC
  Filled 2016-03-17: qty 1

## 2016-03-17 MED ORDER — ONDANSETRON HCL 4 MG PO TABS: 4.0000 mg | ORAL_TABLET | Freq: Three times a day (TID) | ORAL | Status: DC | PRN

## 2016-03-17 MED ORDER — DEXTROSE 5 % IV SOLN
1.0000 g | Freq: Once | INTRAVENOUS | Status: AC
  Administered 2016-03-17: 1 g via INTRAVENOUS
  Filled 2016-03-17: qty 10

## 2016-03-17 NOTE — Discharge Instructions (Signed)
Please return without fail for worsening symptoms, including escalating pain, fever despite antibiotics, vomiting despite nausea medications, or any other symptoms concerning to you.  Pyelonephritis, Adult Pyelonephritis is a kidney infection. The kidneys are the organs that filter a person's blood and move waste out of the bloodstream and into the urine. Urine passes from the kidneys, through the ureters, and into the bladder. There are two main types of pyelonephritis:  Infections that come on quickly without any warning (acute pyelonephritis).  Infections that last for a long period of time (chronic pyelonephritis). In most cases, the infection clears up with treatment and does not cause further problems. More severe infections or chronic infections can sometimes spread to the bloodstream or lead to other problems with the kidneys. CAUSES This condition is usually caused by:  Bacteria traveling from the bladder to the kidney through infected urine. The urine in the bladder can become infected with bacteria from:  Bladder infection (cystitis).  Inflammation of the prostate gland (prostatitis).  Sexual intercourse, in females.  Bacteria traveling from the bloodstream to the kidney. RISK FACTORS This condition is more likely to develop in:  Pregnant women.  Older people.  People who have diabetes.  People who have kidney stones or bladder stones.  People who have other abnormalities of the kidney or ureter.  People who have a catheter placed in the bladder.  People who have cancer.  People who are sexually active.  Women who use spermicides.  People who have had a prior urinary tract infection. SYMPTOMS Symptoms of this condition include:  Frequent urination.  Strong or persistent urge to urinate.  Burning or stinging when urinating.  Abdominal pain.  Back pain.  Pain in the side or flank area.  Fever.  Chills.  Blood in the urine, or dark  urine.  Nausea.  Vomiting. DIAGNOSIS This condition may be diagnosed based on:  Medical history and physical exam.  Urine tests.  Blood tests. You may also have imaging tests of the kidneys, such as an ultrasound or CT scan. TREATMENT Treatment for this condition may depend on the severity of the infection.  If the infection is mild and is found early, you may be treated with antibiotic medicines taken by mouth. You will need to drink fluids to remain hydrated.  If the infection is more severe, you may need to stay in the hospital and receive antibiotics given directly into a vein through an IV tube. You may also need to receive fluids through an IV tube if you are not able to remain hydrated. After your hospital stay, you may need to take oral antibiotics for a period of time. Other treatments may be required, depending on the cause of the infection. HOME CARE INSTRUCTIONS Medicines  Take over-the-counter and prescription medicines only as told by your health care provider.  If you were prescribed an antibiotic medicine, take it as told by your health care provider. Do not stop taking the antibiotic even if you start to feel better. General Instructions  Drink enough fluid to keep your urine clear or pale yellow.  Avoid caffeine, tea, and carbonated beverages. They tend to irritate the bladder.  Urinate often. Avoid holding in urine for long periods of time.  Urinate before and after sex.  After a bowel movement, women should cleanse from front to back. Use each tissue only once.  Keep all follow-up visits as told by your health care provider. This is important. SEEK MEDICAL CARE IF:  Your symptoms do  not get better after 2 days of treatment.  Your symptoms get worse.  You have a fever. SEEK IMMEDIATE MEDICAL CARE IF:  You are unable to take your antibiotics or fluids.  You have shaking chills.  You vomit.  You have severe flank or back pain.  You have  extreme weakness or fainting.   This information is not intended to replace advice given to you by your health care provider. Make sure you discuss any questions you have with your health care provider.   Document Released: 10/17/2005 Document Revised: 07/08/2015 Document Reviewed: 02/09/2015 Elsevier Interactive Patient Education [employer].

## 2016-03-17 NOTE — ED Notes (Signed)
Currently does not want dilaudid still would like to wait.

## 2016-03-17 NOTE — ED Notes (Signed)
Patient here with right flank pain radiating to groin since early am. Reports hematuria and bladder spasms with same. Had recent kidney stone 2 weeks ago.

## 2016-03-17 NOTE — ED Provider Notes (Signed)
CSN: [id number]     Arrival date & time 03/17/16  0857 History   First MD Initiated Contact with Patient 03/17/16 [phone]     Chief Complaint  Patient presents with  . Flank Pain     (Consider location/radiation/quality/duration/timing/severity/associated sxs/prior Treatment) HPI 55 year old female who presents with right flank pain and right lower quadrant abdominal pain. She has a history of nephrolithiasis, interstitial cystitis, hypertension and hyperlipidemia. States that about 1-1/2 week ago she developed sudden onset of right-sided flank pain and right lower quadrant abdominal pain, and shortly after noticed that she passed a piece sized kidney stone. States that she continued to have some difficulty urinating, urinary hesitancy, and urinary frequency with hematuria but the pain was resolved aside from suprapubic pressure. This morning while showering developed sudden onset of severe right flank pain, right lower quadrant abdominal pain with nausea and vomiting. Has had some loose stools over the course of the past day as well. This is not consistent with prior history of kidney stones so brought her to the ED for evaluation. No fevers, chills, abnormal vaginal bleeding or discharge, melena, hematochezia, hematemesis, or upper respiratory symptoms. Past Medical History  Diagnosis Date  . Interstitial cystitis   . Hyperlipidemia     hasn't been on Pravastatin in 6-7wks   . Anxiety   . Barrett esophagus     Dr. Olevia Perches in GI  . GERD (gastroesophageal reflux disease)     takes Nexium daily  . Muscle spasm     takes Ativan nightly as needed  . PONV (postoperative nausea and vomiting)   . Pneumonia     fall 2014  . Numbness     weakness in right leg  . DDD (degenerative disc disease)     s/p back surgeries and steroid injxns  . Joint pain   . Joint swelling   . Chronic back pain     stenosis and DDD  . Urinary frequency   . History of kidney stones   . Hypertension     takes  Amlodipine and HCTZ daily  . Depression     takes Fluoxetine daily  . Spinal headache     blood patch required   . Diabetes mellitus     Victoza daily   Past Surgical History  Procedure Laterality Date  . Neck surgery  12/2007  . Appendectomy    . Total abdominal hysterectomy    . Cholecystectomy    . Colonoscopy  2011  . Upper gi endoscopy    . Cesarean section  1988/1989  . Oophorectomy    . Adhesions removed from abdomen    . Knee arthroscopy Bilateral   . Epidural injections    . Laparoscopy N/A 04/15/2014    Procedure: LAPAROSCOPY DIAGNOSTIC;  Surgeon: Joyice Faster. Cornett, MD;  Location: [facility];  Service: General;  Laterality: N/A;  . Laparoscopic lysis of adhesions N/A 04/15/2014    Procedure: LAPAROSCOPIC LYSIS OF ADHESION;  Surgeon: Joyice Faster. Cornett, MD;  Location: [facility];  Service: General;  Laterality: N/A;  . Left heart catheterization with coronary angiogram N/A 02/03/2012    Procedure: LEFT HEART CATHETERIZATION WITH CORONARY ANGIOGRAM;  Surgeon: Troy Sine, MD;  Location: [facility] CATH LAB;  Service: Cardiovascular;  Laterality: N/A;  . Back surgery  12/2007    x 6-fusion x 2  . Lumbar fusion Right 03/24/2015    Procedure: SI joint fusion - right ;  Surgeon: Karie Chimera, MD;  Location: [facility]URO [facility];  Service: Neurosurgery;  Laterality: Right;  SI joint fusion - right    Family History  Problem Relation Age of Onset  . Hypertension Father   . Heart disease Mother     ischemic  . Allergies Sister   . Asthma Sister   . Gallbladder disease Mother   . Colon polyps Father   . Other Mother     rectovaginal fistula   Social History  Substance Use Topics  . Smoking status: Former Smoker -- 0.30 packs/day for 17 years    Types: Cigarettes  . Smokeless tobacco: Never Used     Comment: quit smoking 2 1/2 yrs ago  . Alcohol Use: Yes     Comment: rarely   OB History    No data available     Review of Systems 10/14 systems reviewed and are negative other than those  stated in the HPI    Allergies  Latex; Penicillins; Erythromycin; Ofloxacin; Sulfonamide derivatives; and Azithromycin  Home Medications   Prior to Admission medications   Medication Sig Start Date End Date Taking? Authorizing Provider  amLODipine (NORVASC) 5 MG tablet Take 5 mg by mouth daily.   Yes Historical Provider, MD  Dulaglutide (TRULICITY) A999333 [id number] SOPN Inject 0.75 mg into the skin every 7 (seven) days.   Yes Historical Provider, MD  esomeprazole (NEXIUM) 40 MG capsule Take 1 capsule (40 mg total) by mouth daily before breakfast. 07/09/12  Yes Lafayette Dragon, MD  Evolocumab (REPATHA) 140 MG/ML SOSY Inject 140 mg into the skin every 14 (fourteen) days.   Yes Historical Provider, MD  FLUoxetine (PROZAC) 40 MG capsule Take 1 capsule (40 mg total) by mouth daily. NEEDS APPT FOR REFILLS 07/10/12  Yes Tanda Rockers, MD  hydrochlorothiazide (HYDRODIURIL) 25 MG tablet Take 1 tablet (25 mg total) by mouth daily. NEEDS APPT FOR REFILLS 07/10/12  Yes Tanda Rockers, MD  HYDROcodone-acetaminophen [facility]) 10-325 MG per tablet Take 1 tablet by mouth 2 (two) times daily as needed for pain.   Yes Historical Provider, MD  insulin detemir (LEVEMIR) 100 UNIT/ML injection Inject 30-35 Units into the skin 2 (two) times daily. 35 units in the morning 30 units in the evening   Yes Historical Provider, MD  LORazepam (ATIVAN) 1 MG tablet Take 0.5 mg by mouth at bedtime as needed (muscle spasms).   Yes Historical Provider, MD  traMADol (ULTRAM) 50 MG tablet Take 50 mg by mouth 2 (two) times daily as needed for pain.    Yes Historical Provider, MD  ACCU-CHEK AVIVA PLUS test strip TEST 3 TIMES DAILY Patient not taking: Reported on 03/17/2016 03/06/12   Tanda Rockers, MD  cefpodoxime (VANTIN) 200 MG tablet Take 1 tablet (200 mg total) by mouth 2 (two) times daily. 03/17/16   Forde Dandy, MD  dicyclomine (BENTYL) 20 MG tablet Take 20 mg by mouth 2 (two) times daily as needed (for abdominal pain).  09/12/11    Tammy S Parrett, NP  Liraglutide (VICTOZA) 18 MG/3ML SOPN Inject 1.8 mg into the skin daily.    Historical Provider, MD  ondansetron (ZOFRAN) 4 MG tablet Take 1 tablet (4 mg total) by mouth every 8 (eight) hours as needed for nausea or vomiting. 03/17/16   Forde Dandy, MD  oxyCODONE-acetaminophen (ROXICET) 5-325 MG per tablet Take 1 tablet by mouth every 4 (four) hours as needed. Patient not taking: Reported on 09/14/2014 04/15/14   Erroll Luna, MD  potassium chloride (K-DUR) 10 MEQ tablet Take 1 tablet (10 mEq total) by  mouth daily. Patient not taking: Reported on 09/14/2014 04/15/14   Erroll Luna, MD  pravastatin (PRAVACHOL) 20 MG tablet Take 20 mg by mouth daily.    Historical Provider, MD   BP 153/90 mmHg  Pulse 74  Temp(Src) 97.9 F (36.6 C) (Oral)  Resp 16  SpO2 97% Physical Exam Physical Exam  Nursing note and vitals reviewed. Constitutional: Well developed, well nourished, non-toxic, and in no acute distress Head: Normocephalic and atraumatic.  Mouth/Throat: Oropharynx is clear and moist.  Neck: Normal range of motion. Neck supple.  Cardiovascular: Normal rate and regular rhythm.   Pulmonary/Chest: Effort normal and breath sounds normal.  Abdominal: Soft. There is RLQ and suprapubic tenderness. There is no rebound and no guarding. Mild right CVA tenderness. Musculoskeletal: Normal range of motion.  Neurological: Alert, no facial droop, fluent speech, moves all extremities symmetrically Skin: Skin is warm and dry.  Psychiatric: Cooperative  ED Course  Procedures (including critical care time) Labs Review Labs Reviewed  URINALYSIS, ROUTINE W REFLEX MICROSCOPIC (NOT AT [facility]) - Abnormal; Notable for the following:    Color, Urine AMBER (*)    APPearance TURBID (*)    Specific Gravity, Urine 1.036 (*)    Glucose, UA 100 (*)    Hgb urine dipstick LARGE (*)    Bilirubin Urine SMALL (*)    Ketones, ur 15 (*)    Protein, ur 100 (*)    Leukocytes, UA MODERATE (*)    All  other components within normal limits  CBC WITH DIFFERENTIAL/PLATELET - Abnormal; Notable for the following:    WBC 11.2 (*)    Neutro Abs 7.9 (*)    All other components within normal limits  COMPREHENSIVE METABOLIC PANEL - Abnormal; Notable for the following:    Potassium 3.3 (*)    Chloride 100 (*)    Glucose, Bld 124 (*)    Total Protein 6.4 (*)    All other components within normal limits  URINE MICROSCOPIC-ADD ON - Abnormal; Notable for the following:    Squamous Epithelial / LPF 6-30 (*)    Bacteria, UA MANY (*)    Crystals CA OXALATE CRYSTALS (*)    All other components within normal limits  URINE CULTURE  LIPASE, BLOOD    Imaging Review Ct Renal Stone Study  03/17/2016  CLINICAL DATA:  55 year old female with right flank pain radiating to growing. Given history reports prior kidney stone EXAM: CT ABDOMEN AND PELVIS WITHOUT CONTRAST TECHNIQUE: Multidetector CT imaging of the abdomen and pelvis was performed following the standard protocol without IV contrast. COMPARISON:  Pelvic CT 02/03/2016, 11/03/2015, abdominal CT 03/06/2014 FINDINGS: Lower chest: Unremarkable appearance of the soft tissues of the chest wall. Heart size within normal limits.  No pericardial fluid/thickening. No lower mediastinal adenopathy. Unremarkable appearance of the distal esophagus. No hiatal hernia. No confluent airspace disease, pleural fluid, or pneumothorax within visualized lung. Abdomen/pelvis: Unremarkable appearance of liver and spleen. Unremarkable appearance of bilateral adrenal glands. Cholecystectomy. Unremarkable pancreas. No intrahepatic or extrahepatic biliary ductal dilatation. No intra-peritoneal free air or significant free-fluid. No abnormally dilated small bowel or colon. No transition point. No inflammatory changes of the mesenteries. Partial fecalization of distal ileum. No evidence of obstruction. Surgical changes of appendectomy. Colonic diverticula without associated inflammatory  changes. Right Kidney/Ureter: No hydronephrosis. No nephrolithiasis. No perinephric stranding. Unremarkable course of the right ureter. Left Kidney/Ureter: No hydronephrosis. No nephrolithiasis. No perinephric stranding. Unremarkable course of the left ureter. Unremarkable appearance of the urinary bladder. No significant vascular calcification. No  aneurysm or periaortic fluid identified. Musculoskeletal: No acute fracture. Surgical changes of prior posterior lumbar interbody fusion with laminectomy spanning L3-L5 with bilateral pedicle screw and rod fixation. Surgical changes unchanged from the prior. Sacroiliac fusion on the right again noted. IMPRESSION: No acute finding to account for the patient's symptoms of right lower abdominal pain. No nephrolithiasis. Incidental note made of partial fecalization of the distal ileum, indicating slow transit of bowel contents. Recommend correlation with history of constipation. Signed, Dulcy Fanny. Earleen Newport, DO [facility] Electronically Signed   By: Corrie Mckusick D.O.   On: 03/17/2016 11:41   I have personally reviewed and evaluated these images and lab results as part of my medical decision-making.   EKG Interpretation None      MDM   Final diagnoses:  Pyelonephritis   With persistent pain, vomiting, and urinary complaints after recent passage of kidney stone. Is afebrile, hemodynamically stable, well-appearing and in no acute distress. She has a soft nonsurgical abdomen with some right lower quadrant, suprapubic, and right CVA tenderness. Urinalysis with significant number of WBCs, leukocytes, bacteria, and rbc's. This is suggestive of pyelonephritis. However given history of recent kidney stones, concerned about constant intermittent ureteral stone that would require stenting in the setting of her infection. CT renal stone protocol was performed. Visualized and shows no kidney stone. No other  intra-abdominal processes noted. Patients with pyelonephritis, and in urine is sent for culture. She is given 1 g of ceftriaxone. Mild leukocytosis of 11, normal renal function. No other systemic signs of illness. Appropriate for outpatient management as vomiting controlled as well. Many antibiotic allergies, but was given cefpodoxime for treatment. Strict return and follow-up instructions are reviewed. She expressed understanding of all discharge instructions, and felt comfortable with the plan of care.   Forde Dandy, MD 03/17/16 1249

## 2016-03-17 NOTE — ED Notes (Signed)
Patient remembered the name of pain medication Norco "low dose"

## 2016-03-17 NOTE — ED Notes (Signed)
Doctor at bedside.

## 2016-03-19 LAB — URINE CULTURE

## 2016-03-21 ENCOUNTER — Telehealth (HOSPITAL_BASED_OUTPATIENT_CLINIC_OR_DEPARTMENT_OTHER): Payer: Self-pay | Admitting: Emergency Medicine

## 2016-03-21 NOTE — Telephone Encounter (Signed)
Post ED Visit - Positive Culture Follow-up  Culture report reviewed by antimicrobial stewardship pharmacist:  []  Elenor Quinones, Pharm.D. []  Heide Guile, Pharm.D., BCPS []  Parks Neptune, Pharm.D. []  Alycia Rossetti, Pharm.D., BCPS []  [place], Pharm.D., BCPS, AAHIVP []  Legrand Como, Pharm.D., BCPS, AAHIVP [x]  Milus Glazier, Pharm.D. []  Stephens November, Pharm.D.  Positive urine  culture Treated with cefpodoxime, organism sensitive to the same and no further patient follow-up is required at this time.  Hazle Nordmann 03/21/2016, 10:58 AM

## 2016-08-01 ENCOUNTER — Encounter: Payer: Self-pay | Admitting: Student

## 2016-11-17 ENCOUNTER — Ambulatory Visit (INDEPENDENT_AMBULATORY_CARE_PROVIDER_SITE_OTHER): Payer: Self-pay | Admitting: Orthopedic Surgery

## 2016-11-24 ENCOUNTER — Ambulatory Visit (INDEPENDENT_AMBULATORY_CARE_PROVIDER_SITE_OTHER): Payer: Managed Care, Other (non HMO)

## 2016-11-24 ENCOUNTER — Ambulatory Visit (INDEPENDENT_AMBULATORY_CARE_PROVIDER_SITE_OTHER): Payer: Managed Care, Other (non HMO) | Admitting: Family

## 2016-11-24 DIAGNOSIS — M25461 Effusion, right knee: Secondary | ICD-10-CM | POA: Diagnosis not present

## 2016-11-24 DIAGNOSIS — G8929 Other chronic pain: Secondary | ICD-10-CM

## 2016-11-24 DIAGNOSIS — M25561 Pain in right knee: Principal | ICD-10-CM

## 2016-11-24 NOTE — Progress Notes (Signed)
Office Visit Note   Patient: [name]           Date of Birth: Feb 02, 1961           MRN: [id number] Visit Date: 11/24/2016              Requested by: Maylon Peppers, MD [place], [place] [id number] PCP: Maylon Peppers, MD  Chief Complaint  Patient presents with  . Right Knee - Pain    HPI: Patient complains of right knee pain. She states that she has had several lateral releases with Dr. Marlou Sa over the years and he has seen her for this in the past. She states that with increased activity the swelling gets worse and that she is now having left knee pain and slight swelling which she thinks is due to overcompensation for the right side. Pamella Pert, RMA  The patient is a 56 year old woman who is seen today for evaluation of right him knee pain and swelling. She is patient of Dr. Marlou Sa. She has had to the lateral releases with Dr. Marlou Sa in the past. States that she has been increasing her activity lately having to care for family members. The swelling has worsened and has not been getting any better. Feels she might be overdoing it, overcompensating on her right lower extremity. has had no relief of pain or swelling with nonsteroidals or rest.    Assessment & Plan: Visit Diagnoses:  1. Chronic pain of right knee   2. Effusion, right knee     Plan: Ace wrap applied for compression.  she will follow up in the office as needed.  Follow-Up Instructions: No Follow-up on file.   Right Knee Exam   Tenderness  The patient is experiencing tenderness in the lateral joint line.  Muscle Strength   The patient has normal right knee strength.  Tests  Varus: negative Valgus: negative  Other  Erythema: absent Swelling: moderate Other tests: effusion present       Imaging: No results found.  Orders:  Orders Placed This Encounter  Procedures  . Large Joint Injection/Arthrocentesis  . XR Knee 1-2 Views Right   No orders of  the defined types were placed in this encounter.    Procedures: Large Joint Inj Date/Time: 11/28/2016 11:58 AM Performed by: Meredith Pel Authorized by: Dondra Prader R   Consent Given by:  Patient Site marked: the procedure site was marked   Timeout: prior to procedure the correct patient, procedure, and site was verified   Indications:  Pain, diagnostic evaluation and joint swelling Location:  Knee Site:  R knee Needle Size:  18 G Needle Length:  1.5 inches Approach:  Superolateral Ultrasound Guidance: No   Fluoroscopic Guidance: No   Arthrogram: No   Medications:  5 mL lidocaine 1 %; 40 mg methylPREDNISolone acetate 40 MG/ML Aspiration Attempted: No   Aspirate amount (mL):  40 Aspirate:  Clear and yellow Patient tolerance:  Patient tolerated the procedure well with no immediate complications     Clinical Data: No additional findings.  Subjective: Review of Systems  Constitutional: Negative for chills and fever.  Musculoskeletal: Positive for arthralgias and joint swelling.    Objective: Vital Signs: There were no vitals taken for this visit.  Specialty Comments:  No specialty comments available.  PMFS History: Patient Active Problem List   Diagnosis Date Noted  . 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 Abdominal pain, chronic, right lower quadrant 03/03/2014  .  Chest pain 02/02/2012  . Leukocytosis 02/02/2012  . Barrett's esophagus 04/28/2011  . FATTY LIVER DISEASE 05/24/2010  . SMOKER 09/08/2009  . DEPRESSION 04/02/2008  . DIABETES MELLITUS 12/25/2007  . HYPERLIPIDEMIA 12/25/2007  . OBESITY 12/25/2007  . HYPERTENSION 12/25/2007  . INTERSTITIAL CYSTITIS 12/25/2007  . [place] DISEASE 12/25/2007   Past Medical History:  Diagnosis Date  . Anxiety   . Barrett esophagus    Dr. Olevia Perches in GI  . Chronic back pain    stenosis and DDD  . DDD (degenerative disc disease)    s/p back surgeries and steroid injxns  . Depression    takes  Fluoxetine daily  . Diabetes mellitus    Victoza daily  . GERD (gastroesophageal reflux disease)    takes Nexium daily  . History of kidney stones   . Hyperlipidemia    hasn't been on Pravastatin in 6-7wks   . Hypertension    takes Amlodipine and HCTZ daily  . Interstitial cystitis   . Joint pain   . Joint swelling   . Muscle spasm    takes Ativan nightly as needed  . Numbness    weakness in right leg  . Pneumonia    fall 2014  . PONV (postoperative nausea and vomiting)   . Spinal headache    blood patch required   . Urinary frequency     Family History  Problem Relation Age of Onset  . Hypertension Father   . Colon polyps Father   . Heart disease Mother     ischemic  . Gallbladder disease Mother   . Other Mother     rectovaginal fistula  . Allergies Sister   . Asthma Sister     Past Surgical History:  Procedure Laterality Date  . adhesions removed from abdomen    . APPENDECTOMY    . BACK SURGERY  12/2007   x 6-fusion x 2  . CESAREAN SECTION  1988/1989  . CHOLECYSTECTOMY    . COLONOSCOPY  2011  . epidural injections    . KNEE ARTHROSCOPY Bilateral   . LAPAROSCOPIC LYSIS OF ADHESIONS N/A 04/15/2014   Procedure: LAPAROSCOPIC LYSIS OF ADHESION;  Surgeon: Joyice Faster. Cornett, MD;  Location: [place];  Service: General;  Laterality: N/A;  . LAPAROSCOPY N/A 04/15/2014   Procedure: LAPAROSCOPY DIAGNOSTIC;  Surgeon: Joyice Faster. Cornett, MD;  Location: [facility];  Service: General;  Laterality: N/A;  . LEFT HEART CATHETERIZATION WITH CORONARY ANGIOGRAM N/A 02/03/2012   Procedure: LEFT HEART CATHETERIZATION WITH CORONARY ANGIOGRAM;  Surgeon: Troy Sine, MD;  Location: [facility] CATH LAB;  Service: Cardiovascular;  Laterality: N/A;  . LUMBAR FUSION Right 03/24/2015   Procedure: SI joint fusion - right ;  Surgeon: Karie Chimera, MD;  Location: [place] [facility]UR[facility]S;  Service: Neurosurgery;  Laterality: Right;  SI joint fusion - right   . NECK SURGERY  12/2007  . OOPHORECTOMY    . TOTAL ABDOMINAL  HYSTERECTOMY    . upper gi endoscopy     Social History   Occupational History  . on disability since March 2009 d/t back pain   . 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 Smoking status: Former Smoker    Packs/day: 0.30    Years: 17.00    Types: Cigarettes  . Smokeless tobacco: Never Used     Comment: quit smoking 2 1/2 yrs ago  . Alcohol use Yes     Comment: rarely  .  Drug use: No  . Sexual activity: Yes

## 2016-11-28 DIAGNOSIS — M25461 Effusion, right knee: Secondary | ICD-10-CM | POA: Diagnosis not present

## 2016-11-28 MED ORDER — LIDOCAINE HCL 1 % IJ SOLN
5.0000 mL | INTRAMUSCULAR | Status: AC | PRN
  Administered 2016-11-28: 5 mL

## 2016-11-28 MED ORDER — METHYLPREDNISOLONE ACETATE 40 MG/ML IJ SUSP
40.0000 mg | INTRAMUSCULAR | Status: AC | PRN
  Administered 2016-11-28: 40 mg via INTRA_ARTICULAR

## 2016-11-29 ENCOUNTER — Encounter: Payer: Self-pay | Admitting: Gastroenterology

## 2016-11-29 ENCOUNTER — Ambulatory Visit (INDEPENDENT_AMBULATORY_CARE_PROVIDER_SITE_OTHER): Payer: Managed Care, Other (non HMO) | Admitting: Gastroenterology

## 2016-11-29 VITALS — BP 118/84 | Ht 66.0 in | Wt 208.5 lb

## 2016-11-29 DIAGNOSIS — K5901 Slow transit constipation: Secondary | ICD-10-CM | POA: Diagnosis not present

## 2016-11-29 DIAGNOSIS — R1030 Lower abdominal pain, unspecified: Secondary | ICD-10-CM

## 2016-11-29 NOTE — Patient Instructions (Signed)
If you are age 55 or older, your body mass index should be between 23-30. Your Body mass index is 33.65 kg/m. If this is out of the aforementioned range listed, please consider follow up with your Primary Care Provider.  If you are age 23 or younger, your body mass index should be between 19-25. Your Body mass index is 33.65 kg/m. If this is out of the aformentioned range listed, please consider follow up with your Primary Care Provider.   Thank you for choosing [facility]  Dr Wilfrid Lund III

## 2016-11-29 NOTE — Progress Notes (Signed)
[facility] Consult Note:  History: [name] 11/29/2016  Referring physician: Maylon Peppers, MD  Reason for consult/chief complaint: barretts esophagus (here to discuss endoscopy; pt also c/o abdominal pain alternating with constipation and diarrhea x 6 months intermittently )   Subjective  HPI:  This is a 56 year old woman last seen in the clinic by Dr. Olevia Perches in August 2013. At that point, she carried a diagnosis of Barrett's esophagus which seems to have been the endoscopic impression, but biopsies appear to be of gastric mucosa with intestinal metaplasia. This was the case in 2011 as well. She was known to be H. pylori negative and small bowel biopsies were negative for celiac sprue. There was also some chronic abdominal pain and constipation. Last colonoscopy in August 2011 showed just hyperplastic polyps.  On this occasion, she has bothered by ongoing constipation, but also some intermittent acute episodes that have made her very concerned. She will often have small passage of pellet-like stools, and then when she finally has a large bowel movement after few days, she will have severe crampy abdominal pain as well as feel lightheaded and sweaty as if she may pass out during a bowel movement. This will last for several minutes and then she will feel back to normal. She only has these episodes occur intermittently during bowel movements. Her blood sugar control has been fair, and I reviewed her endocrinologist most recent note. Changes were made her diabetic medication a few weeks ago, and she has noticed significant improvement in glucose. At time she was running levels over 400. She also started a probiotic about 2 weeks ago.  In the past she tried Linzess for constipation but it was no help. She would occasionally take a glycerin suppository. Aarini denies rectal bleeding, change in appetite or weight loss. In addition, she admits to significant stress over the last  year when her mother had been sick and eventually died several months ago. Her husband has also had some ongoing cardiac issues, and Rocquel needs to have part of a lip removed in the near future due to skin cancer.  ROS:  Review of Systems  Constitutional: Negative for appetite change and unexpected weight change.  HENT: Negative for mouth sores and voice change.   Eyes: Negative for pain and redness.  Respiratory: Negative for cough and shortness of breath.   Cardiovascular: Negative for chest pain and palpitations.  Genitourinary: Negative for dysuria and hematuria.  Musculoskeletal: Negative for arthralgias and myalgias.  Skin: Negative for pallor and rash.  Neurological: Negative for weakness and headaches.  Hematological: Negative for adenopathy.  Psychiatric/Behavioral: The patient is nervous/anxious.      Past Medical History: Past Medical History:  Diagnosis Date  . Anxiety   . Barrett esophagus    Dr. Olevia Perches in GI  . Chronic back pain    stenosis and DDD  . DDD (degenerative disc disease)    s/p back surgeries and steroid injxns  . Depression    takes Fluoxetine daily  . Diabetes mellitus    Victoza daily  . GERD (gastroesophageal reflux disease)    takes Nexium daily  . History of kidney stones   . Hyperlipidemia    hasn't been on Pravastatin in 6-7wks   . Hypertension    takes Amlodipine and HCTZ daily  . Interstitial cystitis   . Joint pain   . Joint swelling   . Muscle spasm    takes Ativan nightly as needed  . Numbness    weakness  in right leg  . Pneumonia    fall 2014  . PONV (postoperative nausea and vomiting)   . Spinal headache    blood patch required   . Urinary frequency      Past Surgical History: Past Surgical History:  Procedure Laterality Date  . adhesions removed from abdomen    . APPENDECTOMY    . BACK SURGERY  12/2007   x 6-fusion x 2  . CESAREAN SECTION  1988/1989  . CHOLECYSTECTOMY    . COLONOSCOPY  2011  . epidural  injections    . KNEE ARTHROSCOPY Bilateral   . LAPAROSCOPIC LYSIS OF ADHESIONS N/A 04/15/2014   Procedure: LAPAROSCOPIC LYSIS OF ADHESION;  Surgeon: Joyice Faster. Cornett, MD;  Location: [facility];  Service: General;  Laterality: N/A;  . LAPAROSCOPY N/A 04/15/2014   Procedure: LAPAROSCOPY DIAGNOSTIC;  Surgeon: Joyice Faster. Cornett, MD;  Location: [facility];  Service: General;  Laterality: N/A;  . LEFT HEART CATHETERIZATION WITH CORONARY ANGIOGRAM N/A 02/03/2012   Procedure: LEFT HEART CATHETERIZATION WITH CORONARY ANGIOGRAM;  Surgeon: Troy Sine, MD;  Location: [facility] CATH LAB;  Service: Cardiovascular;  Laterality: N/A;  . LUMBAR FUSION Right 03/24/2015   Procedure: SI joint fusion - right ;  Surgeon: Karie Chimera, MD;  Location: [facility]URO [facility];  Service: Neurosurgery;  Laterality: Right;  SI joint fusion - right   . NECK SURGERY  12/2007  . OOPHORECTOMY    . TOTAL ABDOMINAL HYSTERECTOMY    . upper gi endoscopy       Family History: Family History  Problem Relation Age of Onset  . Hypertension Father   . Colon polyps Father   . Heart disease Mother     ischemic  . Gallbladder disease Mother   . Other Mother     rectovaginal fistula  . Allergies Sister   . Asthma Sister     Social History: Social History   Social History  . Marital status: Married    Spouse name: N/A  . Number of children: 2  . Years of education: N/A   Occupational History  . on disability since March 2009 d/t back pain   . 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 Smoking status: Former Smoker    Packs/day: 0.30    Years: 17.00    Types: Cigarettes  . Smokeless tobacco: Never Used     Comment: quit smoking 2 1/2 yrs ago  . Alcohol use Yes     Comment: rarely  . Drug use: No  . Sexual activity: Yes   Other Topics Concern  . None   Social History Narrative   She lives at home with her husband and daughter. She is still active and ambulate on her own. She is currently smoking up  to a pack per day but interested in stopping.     Allergies: Allergies  Allergen Reactions  . Latex Anaphylaxis  . Penicillins Anaphylaxis    Has patient had a PCN reaction causing immediate rash, facial/tongue/throat swelling, SOB or lightheadedness with hypotension: No Has patient had a PCN reaction causing severe rash involving mucus membranes or skin necrosis: No Has patient had a PCN reaction that required hospitalization No Has patient had a PCN reaction occurring within the last 10 years: No If all of the above answers are "NO", then may proceed with Cephalosporin use.  . Erythromycin Hives and Itching  . Ofloxacin Hives and Itching  . Sulfonamide Derivatives  Hives and Itching  . Azithromycin     "makes my heart rate go crazy"    Outpatient Meds: Current Outpatient Prescriptions  Medication Sig Dispense Refill  . ACCU-CHEK AVIVA PLUS test strip TEST 3 TIMES DAILY 100 strip 5  . amLODipine (NORVASC) 5 MG tablet Take 5 mg by mouth daily.    . cefpodoxime (VANTIN) 200 MG tablet Take 1 tablet (200 mg total) by mouth 2 (two) times daily. 20 tablet 0  . Dulaglutide (TRULICITY) A999333 0000000 SOPN Inject 0.75 mg into the skin every 7 (seven) days.    Marland Kitchen esomeprazole (NEXIUM) 40 MG capsule Take 1 capsule (40 mg total) by mouth daily before breakfast. 90 capsule 1  . Evolocumab (REPATHA) 140 MG/ML SOSY Inject 140 mg into the skin every 14 (fourteen) days.    Marland Kitchen FLUoxetine (PROZAC) 40 MG capsule Take 1 capsule (40 mg total) by mouth daily. NEEDS APPT FOR REFILLS 90 capsule 0  . hydrochlorothiazide (HYDRODIURIL) 25 MG tablet Take 1 tablet (25 mg total) by mouth daily. NEEDS APPT FOR REFILLS 90 tablet 0  . HYDROcodone-acetaminophen (NORCO) 10-325 MG per tablet Take 1 tablet by mouth 2 (two) times daily as needed for pain.    Marland Kitchen insulin detemir (LEVEMIR) 100 UNIT/ML injection Inject 30-35 Units into the skin 2 (two) times daily. 35 units in the morning 30 units in the evening    . Liraglutide  (VICTOZA) 18 MG/3ML SOPN Inject 1.8 mg into the skin daily.    Marland Kitchen LORazepam (ATIVAN) 1 MG tablet Take 0.5 mg by mouth at bedtime as needed (muscle spasms).    . potassium chloride (K-DUR) 10 MEQ tablet Take 1 tablet (10 mEq total) by mouth daily. 6 tablet 0   No current facility-administered medications for this visit.       ___________________________________________________________________ Objective   Exam:  BP 118/84   Ht 5\' 6"  (1.676 m)   Wt 208 lb 8 oz (94.6 kg)   BMI 33.65 kg/m    General: this is a(n) Well-appearing middle-aged woman, good muscle mass   Eyes: sclera anicteric, no redness  ENT: oral mucosa moist without lesions, no cervical or supraclavicular lymphadenopathy, good dentition  CV: RRR without murmur, S1/S2, no JVD, no peripheral edema  Resp: clear to auscultation bilaterally, normal RR and effort noted  GI: soft, no tenderness, with active bowel sounds. No guarding or palpable organomegaly noted.  Skin; warm and dry, no rash or jaundice noted  Neuro: awake, alert and oriented x 3. Normal gross motor function and fluent speech  Labs:  Last CBC normal in May 2017  EGD and colonoscopy biopsy results reviewed as noted above   Assessment: Encounter Diagnoses  Name Primary?  . Slow transit constipation Yes  . 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 I think her underlying satiety may be causing her some of the abdominal pain, but I suspect most of her bowel habit irregularity is poorly controlled diabetes. It seems that it is lately been better with improved glucose control.  Plan:  I have no further tests planned at this point. I think with some more time a better glucose control her bowel habits with improved some. Of course, she was advised to get regular exercise and eat a diet with  plenty of fiber. She was asked to see me  as needed.  Thank you for the courtesy of this consult.  Please call me with any questions or concerns.  Nelida Meuse III  CC: Maylon Peppers, MD

## 2016-12-02 ENCOUNTER — Ambulatory Visit (INDEPENDENT_AMBULATORY_CARE_PROVIDER_SITE_OTHER): Payer: Self-pay | Admitting: Orthopedic Surgery

## 2016-12-08 ENCOUNTER — Encounter (INDEPENDENT_AMBULATORY_CARE_PROVIDER_SITE_OTHER): Payer: Self-pay | Admitting: Orthopedic Surgery

## 2016-12-08 ENCOUNTER — Ambulatory Visit (INDEPENDENT_AMBULATORY_CARE_PROVIDER_SITE_OTHER): Payer: Managed Care, Other (non HMO) | Admitting: Orthopedic Surgery

## 2016-12-08 DIAGNOSIS — M25561 Pain in right knee: Secondary | ICD-10-CM | POA: Diagnosis not present

## 2016-12-08 DIAGNOSIS — G8929 Other chronic pain: Secondary | ICD-10-CM

## 2016-12-08 NOTE — Progress Notes (Signed)
Office Visit Note   Patient: [name]           Date of Birth: 09-21-1961           MRN: [id number] Visit Date: 12/08/2016 Requested by: Maylon Peppers, MD [place], [place] [id number] PCP: Maylon Peppers, MD  Subjective: Chief Complaint  Patient presents with  . Right Knee - Follow-up, Pain    HPI [name] is a 56 year old patient with right knee pain and swelling.  She saw a nurse practitioner 11/24/2016 aspiration injection performed at that time.  Injection helped some but she reports recurrent swelling and pain which is also been recurrent.  Pain radiates down the leg to the back of her calf.  No family history of DVT or pulmonary embolism.  She states the knee is giving way.  She had a lateral release on that right knee about 5 years ago.  Denies any fevers or chills.              Review of Systems All systems reviewed are negative as they relate to the chief complaint within the history of present illness.  Patient denies  fevers or chills.    Assessment & Plan: Visit Diagnoses: No diagnosis found.  Plan: Impression is right knee pain and swelling status post lateral release 5 years ago with unexplained recurrent effusions.  Patient is not really increased her activity level significantly over the past several weeks.  She's not had any injury.  I aspirated the knee today got about 40 mL out.  Did not appear infected.  Clear yellow fluid present. MRI scan to evaluate source of this chronic recurring effusion.  I'll see her back after that study.  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  Measuring the size of about a walnut and it is not palpable.  Follow-Up Instructions: Return for after MRI.   Orders:  No orders of the defined types were placed in this encounter.  No orders of the defined types were placed in this  encounter.     Procedures: Large Joint Inj Date/Time: 12/09/2016 3:56 PM Performed by: Meredith Pel Authorized by: Meredith Pel   Consent Given by:  Patient Site marked: the procedure site was marked   Timeout: prior to procedure the correct patient, procedure, and site was verified   Indications:  Pain, joint swelling and diagnostic evaluation Location:  Knee Site:  R knee Prep: patient was prepped and draped in usual sterile fashion   Needle Size:  18 G Needle Length:  1.5 inches Approach:  Superolateral Ultrasound Guidance: No   Fluoroscopic Guidance: No   Arthrogram: No   Medications:  5 mL lidocaine 1 % Aspiration Attempted: Yes   Aspirate amount (mL):  40 Aspirate:  Serous Patient tolerance:  Patient tolerated the procedure well with no immediate complications     Clinical Data: No additional findings.  Objective: Vital Signs: There were no vitals taken for this visit.  Physical Exam   Constitutional: Patient appears well-developed HEENT:  Head: Normocephalic Eyes:EOM are normal Neck: Normal range of motion Cardiovascular: Normal rate Pulmonary/chest: Effort normal Neurologic: Patient is alert Skin: Skin is warm Psychiatric: Patient has normal mood and affect    Ortho Exam orthopedic exam demonstrates moderate effusion in the right knee with no proximal lymphadenopathy range of motion  full collateral crucial ligaments stable mild medial lateral joint line tenderness is present pedal pulses palpable posterior knee nontender since her mechanism is intact Specialty Comments:  No specialty comments available.  Imaging: No results found.   PMFS History: Patient Active Problem List   Diagnosis Date Noted  . 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 Chest pain 02/02/2012  . Leukocytosis 02/02/2012  . Barrett's esophagus 04/28/2011  . FATTY LIVER DISEASE 05/24/2010  . SMOKER 09/08/2009  . DEPRESSION 04/02/2008  . DIABETES MELLITUS 12/25/2007   . HYPERLIPIDEMIA 12/25/2007  . OBESITY 12/25/2007  . HYPERTENSION 12/25/2007  . INTERSTITIAL CYSTITIS 12/25/2007  . [place] DISEASE 12/25/2007   Past Medical History:  Diagnosis Date  . Anxiety   . Barrett esophagus    Dr. Olevia Perches in GI  . Chronic back pain    stenosis and DDD  . DDD (degenerative disc disease)    s/p back surgeries and steroid injxns  . Depression    takes Fluoxetine daily  . Diabetes mellitus    Victoza daily  . GERD (gastroesophageal reflux disease)    takes Nexium daily  . History of kidney stones   . Hyperlipidemia    hasn't been on Pravastatin in 6-7wks   . Hypertension    takes Amlodipine and HCTZ daily  . Interstitial cystitis   . Joint pain   . Joint swelling   . Muscle spasm    takes Ativan nightly as needed  . Numbness    weakness in right leg  . Pneumonia    fall 2014  . PONV (postoperative nausea and vomiting)   . Spinal headache    blood patch required   . Urinary frequency     Family History  Problem Relation Age of Onset  . Hypertension Father   . Colon polyps Father   . Heart disease Mother     ischemic  . Gallbladder disease Mother   . Other Mother     rectovaginal fistula  . Allergies Sister   . Asthma Sister     Past Surgical History:  Procedure Laterality Date  . adhesions removed from abdomen    . APPENDECTOMY    . BACK SURGERY  12/2007   x 6-fusion x 2  . CESAREAN SECTION  1988/1989  . CHOLECYSTECTOMY    . COLONOSCOPY  2011  . epidural injections    . KNEE ARTHROSCOPY Bilateral   . LAPAROSCOPIC LYSIS OF ADHESIONS N/A 04/15/2014   Procedure: LAPAROSCOPIC LYSIS OF ADHESION;  Surgeon: Joyice Faster. Cornett, MD;  Location: [facility];  Service: General;  Laterality: N/A;  . LAPAROSCOPY N/A 04/15/2014   Procedure: LAPAROSCOPY DIAGNOSTIC;  Surgeon: Joyice Faster. Cornett, MD;  Location: [facility];  Service: General;  Laterality: N/A;  . LEFT HEART CATHETERIZATION WITH CORONARY ANGIOGRAM N/A 02/03/2012   Procedure: LEFT HEART  CATHETERIZATION WITH CORONARY ANGIOGRAM;  Surgeon: Troy Sine, MD;  Location: [facility] CATH LAB;  Service: Cardiovascular;  Laterality: N/A;  . LUMBAR FUSION Right 03/24/2015   Procedure: SI joint fusion - right ;  Surgeon: Karie Chimera, MD;  Location: [facility];  Service: Neurosurgery;  Laterality: Right;  SI joint fusion - right   . NECK SURGERY  12/2007  . OOPHORECTOMY    . TOTAL ABDOMINAL HYSTERECTOMY    . upper gi endoscopy     Social History   Occupational History  . on disability since March 2009 d/t back pain   . 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 Smoking status: Former Smoker    Packs/day: 0.30    Years: 17.00    Types: Cigarettes  . Smokeless tobacco: Never Used     Comment: quit smoking 2 1/2 yrs ago  . Alcohol use Yes     Comment: rarely  . Drug use: No  . Sexual activity: Yes

## 2016-12-09 DIAGNOSIS — G8929 Other chronic pain: Secondary | ICD-10-CM

## 2016-12-09 DIAGNOSIS — M25561 Pain in right knee: Secondary | ICD-10-CM

## 2016-12-09 MED ORDER — LIDOCAINE HCL 1 % IJ SOLN
5.0000 mL | INTRAMUSCULAR | Status: AC | PRN
  Administered 2016-12-09: 5 mL

## 2016-12-13 ENCOUNTER — Telehealth (INDEPENDENT_AMBULATORY_CARE_PROVIDER_SITE_OTHER): Payer: Self-pay | Admitting: *Deleted

## 2016-12-13 NOTE — Telephone Encounter (Signed)
Pt called left message on vm wanting to know status of MRI, I called pt back and advised her that we are waiting on clarification from Dr. And once he gives the information I will change order if need to and then imaging will call to scheldule appt with her, pt informed me that she will be out of town on Fri- Sun and then then will be having surgery on the 21st and was hoping to get the imaging done before then.

## 2016-12-14 ENCOUNTER — Other Ambulatory Visit (INDEPENDENT_AMBULATORY_CARE_PROVIDER_SITE_OTHER): Payer: Self-pay | Admitting: *Deleted

## 2016-12-14 DIAGNOSIS — M25561 Pain in right knee: Principal | ICD-10-CM

## 2016-12-14 DIAGNOSIS — G8929 Other chronic pain: Secondary | ICD-10-CM

## 2016-12-14 NOTE — Telephone Encounter (Signed)
Order changed and [facility] imaging has been contacted to schedule pt

## 2016-12-19 ENCOUNTER — Ambulatory Visit
Admission: RE | Admit: 2016-12-19 | Discharge: 2016-12-19 | Disposition: A | Discharge status: Home / Self Care | Payer: Managed Care, Other (non HMO) | Source: Ambulatory Visit | Attending: Orthopedic Surgery | Admitting: Orthopedic Surgery

## 2016-12-19 ENCOUNTER — Encounter (INDEPENDENT_AMBULATORY_CARE_PROVIDER_SITE_OTHER): Payer: Self-pay | Admitting: Orthopedic Surgery

## 2016-12-19 ENCOUNTER — Ambulatory Visit (INDEPENDENT_AMBULATORY_CARE_PROVIDER_SITE_OTHER): Payer: Managed Care, Other (non HMO) | Admitting: Orthopedic Surgery

## 2016-12-19 DIAGNOSIS — M25561 Pain in right knee: Principal | ICD-10-CM

## 2016-12-19 DIAGNOSIS — G8929 Other chronic pain: Secondary | ICD-10-CM

## 2016-12-19 NOTE — Progress Notes (Signed)
Office Visit Note   Patient: [name]           Date of Birth: May 29, 1961           MRN: [id number] Visit Date: 12/19/2016 Requested by: Maylon Peppers, MD [place],  [address] PCP: Maylon Peppers, MD  Subjective: Chief Complaint  Patient presents with  . Right Knee - Pain    HPI patient is a 56 year old female with right knee pain and swelling.  Since of [facility] she's had an MRI scan which does show an effusion and synovitis but no definite meniscal ligamentous pathology.  Chondral surfaces also looked reasonably intact.  She has had a lateral release which is visualized on the scan.  She is requesting an aspiration today.  She states that her entire leg hurts.  She does have an extensive back surgery history.  She did have some chills last night.  She states that it's "bad pain".              Review of Systems all systems reviewed are negative as they relate to the history of present illness   Assessment & Plan: Visit Diagnoses:  1. Right knee pain, unspecified chronicity     Plan: Impression is persistent right knee pain and swelling.  Aspiration performed today and I am going to send that off for Gram stain and cell count aerobic and anaerobic culture.  I'm also going to draw a CBC differential sedimentation rate C-reactive protein.  We'll see what that shows.  Structurally there is nothing really arthroscopically to do about her knee.  Follow-Up Instructions: No Follow-up on file.   Orders:  No orders of the defined types were placed in this encounter.  No orders of the defined types were placed in this encounter.     Procedures: Large Joint Inj Date/Time: 12/21/2016 9:11 AM Performed by: Meredith Pel Authorized by: Meredith Pel   Consent Given by:  Patient Site marked: the procedure site was marked   Timeout: prior to procedure the correct patient, procedure, and site was verified   Indications:   Pain, joint swelling and diagnostic evaluation Location:  Knee Site:  R knee Prep: patient was prepped and draped in usual sterile fashion   Needle Size:  18 G Needle Length:  1.5 inches Approach:  Superolateral Ultrasound Guidance: No   Fluoroscopic Guidance: No   Arthrogram: No   Medications:  5 mL lidocaine 1 % Aspiration Attempted: Yes   Aspirate amount (mL):  40 Aspirate:  Yellow Lab: fluid sent for laboratory analysis   Patient tolerance:  Patient tolerated the procedure well with no immediate complications     Clinical Data: No additional findings.  Objective: Vital Signs: There were no vitals taken for this visit.  Physical Exam   Constitutional: Patient appears well-developed HEENT:  Head: Normocephalic Eyes:EOM are normal Neck: Normal range of motion Cardiovascular: Normal rate Pulmonary/chest: Effort normal Neurologic: Patient is alert Skin: Skin is warm Psychiatric: Patient has normal mood and affect    Ortho Exam orthopedic examination of the right knee demonstrates recurrent effusion increased patellar mobility consistent with lateral release intact extensor mechanism no proximal lymphadenopathy stable collateral crucial ligaments palpable pedal pulses no posterior knee pain or swelling noted.  No focal joint line tenderness present.  Specialty Comments:  No specialty comments available.  Imaging: Mr Knee Right W/o Contrast  Result Date: 12/19/2016 CLINICAL DATA:  Right knee pain and swelling since December,  2017. No known injury. History of surgery for lateral meniscal tear in 2011. EXAM: MRI OF THE RIGHT KNEE WITHOUT CONTRAST TECHNIQUE: Multiplanar, multisequence MR imaging of the knee was performed. No intravenous contrast was administered. COMPARISON:  MRI right knee 09/16/2014. FINDINGS: MENISCI Medial meniscus: There is some degenerative signal in the posterior horn but no tear is identified. Lateral meniscus:  Intact. LIGAMENTS Cruciates:  Intact.   There is mild mucoid degeneration of the PCL Collaterals:  Intact. CARTILAGE Patellofemoral:  Minimally degenerated, unchanged. Medial: Mildly thinned along the weight-bearing surface of the medial femoral condyle, unchanged. Lateral: Thinning along the weight-bearing surface of the lateral femoral condyle is seen, unchanged. Joint:  Moderate to moderately large joint effusion is present. Popliteal Fossa: Baker's cyst measures 2.8 cm transverse x 0.9 cm AP x 4.5 cm craniocaudal, decreased from 7.2 x 4.0 x 2.4 cm on the comparison MRI. Extensor Mechanism:  Status post lateral release.  No tear. Bones: Intraosseous ganglia in the tibia centered at the PCL attachment are noted. No fracture or worrisome lesion. Small osteophytes are seen about the knee. Other: None. IMPRESSION: Negative for meniscal or ligament tear. Moderate to moderately large joint effusion. Baker's cyst, decreased in size since the prior MRI. No change in mild to moderate osteoarthritis. Electronically Signed   By: Inge Rise M.D.   On: 12/19/2016 14:18     PMFS History: Patient Active Problem List   Diagnosis Date Noted  . 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 Chest pain 02/02/2012  . Leukocytosis 02/02/2012  . Barrett's esophagus 04/28/2011  . FATTY LIVER DISEASE 05/24/2010  . SMOKER 09/08/2009  . DEPRESSION 04/02/2008  . DIABETES MELLITUS 12/25/2007  . HYPERLIPIDEMIA 12/25/2007  . OBESITY 12/25/2007  . HYPERTENSION 12/25/2007  . INTERSTITIAL CYSTITIS 12/25/2007  . [place] DISEASE 12/25/2007   Past Medical History:  Diagnosis Date  . Anxiety   . Barrett esophagus    Dr. Olevia Perches in [place]  . Chronic back pain    stenosis and DDD  . DDD (degenerative disc disease)    s/p back surgeries and steroid injxns  . Depression    takes Fluoxetine daily  . Diabetes mellitus    Victoza daily  . GERD (gastroesophageal reflux disease)    takes Nexium daily  . History of kidney stones   . Hyperlipidemia    hasn't  been on Pravastatin in 6-7wks   . Hypertension    takes Amlodipine and HCTZ daily  . Interstitial cystitis   . Joint pain   . Joint swelling   . Muscle spasm    takes Ativan nightly as needed  . Numbness    weakness in right leg  . Pneumonia    fall 2014  . PONV (postoperative nausea and vomiting)   . Spinal headache    blood patch required   . Urinary frequency     Family History  Problem Relation Age of Onset  . Hypertension Father   . Colon polyps Father   . Heart disease Mother     ischemic  . Gallbladder disease Mother   . Other Mother     rectovaginal fistula  . Allergies Sister   . Asthma Sister     Past Surgical History:  Procedure Laterality Date  . adhesions removed from abdomen    . APPENDECTOMY    . BACK SURGERY  12/2007   x 6-fusion x 2  . CESAREAN SECTION  1988/1989  . CHOLECYSTECTOMY    . COLONOSCOPY  2011  . epidural  injections    . KNEE ARTHROSCOPY Bilateral   . LAPAROSCOPIC LYSIS OF ADHESIONS N/A 04/15/2014   Procedure: LAPAROSCOPIC LYSIS OF ADHESION;  Surgeon: Joyice Faster. Cornett, MD;  Location: [place];  Service: General;  Laterality: N/A;  . LAPAROSCOPY N/A 04/15/2014   Procedure: LAPAROSCOPY DIAGNOSTIC;  Surgeon: Joyice Faster. Cornett, MD;  Location: [place];  Service: General;  Laterality: N/A;  . LEFT HEART CATHETERIZATION WITH CORONARY ANGIOGRAM N/A 02/03/2012   Procedure: LEFT HEART CATHETERIZATION WITH CORONARY ANGIOGRAM;  Surgeon: Troy Sine, MD;  Location: [facility] CATH LAB;  Service: Cardiovascular;  Laterality: N/A;  . LUMBAR FUSION Right 03/24/2015   Procedure: SI joint fusion - right ;  Surgeon: Karie Chimera, MD;  Location: [place] NEURO ORS;  Service: Neurosurgery;  Laterality: Right;  SI joint fusion - right   . NECK SURGERY  12/2007  . OOPHORECTOMY    . TOTAL ABDOMINAL HYSTERECTOMY    . upper gi endoscopy     Social History   Occupational History  . on disability since March 2009 d/t back pain   . 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 Smoking status: Former Smoker    Packs/day: 0.30    Years: 17.00    Types: Cigarettes  . Smokeless tobacco: Never Used     Comment: quit smoking 2 1/2 yrs ago  . Alcohol use Yes     Comment: rarely  . Drug use: No  . Sexual activity: Yes

## 2016-12-20 ENCOUNTER — Telehealth (INDEPENDENT_AMBULATORY_CARE_PROVIDER_SITE_OTHER): Payer: Self-pay | Admitting: Orthopedic Surgery

## 2016-12-20 LAB — SYNOVIAL CELL COUNT + DIFF, W/ CRYSTALS
BASOPHILS, %: 0 %
Eosinophils-Synovial: 0 % (ref 0–2)
LYMPHOCYTES-SYNOVIAL FLD: 56 % (ref 0–74)
MONOCYTE/MACROPHAGE: 40 % (ref 0–69)
Neutrophil, Synovial: 1 % (ref 0–24)
SYNOVIOCYTES, %: 3 % (ref 0–15)
WBC, Synovial: 215 cells/uL — ABNORMAL HIGH (ref <150)

## 2016-12-20 LAB — CBC WITH DIFFERENTIAL/PLATELET
BASOS PCT: 0 %
Basophils Absolute: 0 cells/uL (ref 0–200)
EOS ABS: 214 {cells}/uL (ref 15–500)
Eosinophils Relative: 2 %
HEMATOCRIT: 40.2 % (ref 35.0–45.0)
HEMOGLOBIN: 13.4 g/dL (ref 11.7–15.5)
LYMPHS ABS: 3745 {cells}/uL (ref 850–3900)
LYMPHS PCT: 35 %
MCH: 30.7 pg (ref 27.0–33.0)
MCHC: 33.3 g/dL (ref 32.0–36.0)
MCV: 92 fL (ref 80.0–100.0)
MONO ABS: 642 {cells}/uL (ref 200–950)
MPV: 9.9 fL (ref 7.5–12.5)
Monocytes Relative: 6 %
NEUTROS PCT: 57 %
Neutro Abs: 6099 cells/uL (ref 1500–7800)
Platelets: 395 10*3/uL (ref 140–400)
RBC: 4.37 MIL/uL (ref 3.80–5.10)
RDW: 13.3 % (ref 11.0–15.0)
WBC: 10.7 10*3/uL (ref 3.8–10.8)

## 2016-12-20 LAB — GRAM STAIN: GRAM STAIN: NONE SEEN

## 2016-12-20 LAB — SEDIMENTATION RATE: SED RATE: 4 mm/h (ref 0–30)

## 2016-12-20 LAB — C-REACTIVE PROTEIN: CRP: 5.4 mg/L (ref <8.0)

## 2016-12-20 NOTE — Telephone Encounter (Signed)
Juliann Pulse called needing to verify a hand written culture. CB # [id number] Option 2 Ref # [id number]

## 2016-12-20 NOTE — Telephone Encounter (Signed)
IC and verified we want aerobic and anaerobic cultures done on fluid. S/w [name].

## 2016-12-21 DIAGNOSIS — M25561 Pain in right knee: Secondary | ICD-10-CM | POA: Diagnosis not present

## 2016-12-21 MED ORDER — LIDOCAINE HCL 1 % IJ SOLN
5.0000 mL | INTRAMUSCULAR | Status: AC | PRN
  Administered 2016-12-21: 5 mL

## 2016-12-23 ENCOUNTER — Ambulatory Visit (INDEPENDENT_AMBULATORY_CARE_PROVIDER_SITE_OTHER): Payer: Managed Care, Other (non HMO) | Admitting: Orthopedic Surgery

## 2016-12-24 LAB — BODY FLUID CULTURE
GRAM STAIN: NONE SEEN
Gram Stain: NONE SEEN
Organism ID, Bacteria: NO GROWTH

## 2016-12-25 LAB — ANAEROBIC CULTURE
Gram Stain: NONE SEEN
Gram Stain: NONE SEEN

## 2017-01-24 ENCOUNTER — Encounter (INDEPENDENT_AMBULATORY_CARE_PROVIDER_SITE_OTHER): Payer: Self-pay | Admitting: Orthopedic Surgery

## 2017-01-24 ENCOUNTER — Ambulatory Visit (INDEPENDENT_AMBULATORY_CARE_PROVIDER_SITE_OTHER): Payer: Managed Care, Other (non HMO) | Admitting: Orthopedic Surgery

## 2017-01-24 DIAGNOSIS — M25561 Pain in right knee: Secondary | ICD-10-CM

## 2017-01-24 MED ORDER — METHYLPREDNISOLONE ACETATE 40 MG/ML IJ SUSP
40.0000 mg | INTRAMUSCULAR | Status: AC | PRN
  Administered 2017-01-24: 40 mg via INTRA_ARTICULAR

## 2017-01-24 MED ORDER — LIDOCAINE HCL 1 % IJ SOLN
5.0000 mL | INTRAMUSCULAR | Status: AC | PRN
  Administered 2017-01-24: 5 mL

## 2017-01-24 MED ORDER — BUPIVACAINE HCL 0.25 % IJ SOLN
4.0000 mL | INTRAMUSCULAR | Status: AC | PRN
  Administered 2017-01-24: 4 mL via INTRA_ARTICULAR

## 2017-01-24 NOTE — Progress Notes (Signed)
Office Visit Note   Patient: [name]           Date of Birth: [birth year]           MRN: [id number] Visit Date: 01/24/2017 Requested by: Maylon Peppers, MD [place], [place] [id number] PCP: Maylon Peppers, MD  Subjective: Chief Complaint  Patient presents with  . Right Knee - Pain    HPI: [name] is a 56 year old patient with right knee pain.  14 continuous right knee pain and swelling.  Had an MRI scan which showed synovitis but no structural damage to the menisci ligaments and only minimal changes to the articular surface.  She describes throbbing pain.  She has seen Dr. Janna Arch was told this is not coming from her back.  Workup on the fluid was negative for infection including lab work as well as culture.  Describes some occasional swelling into the calf.  States that the pain really and her weekend.              ROS: All systems reviewed are negative as they relate to the chief complaint within the history of present illness.  Patient denies  fevers or chills.   Assessment & Plan: Visit Diagnoses:  1. Right knee pain, unspecified chronicity     Plan: Impression is right knee effusion synovitis unclear etiology but does not appear to be infectious or structural.  Aspirated and injected the knee today.  We'll try her with an open patella knee sleeve.  In general I think her next decision point will be whether not she wants to consider arthroscopic evaluation of the joint surface with possible arthroscopic synovectomy to decrease the swelling.  That may or may not help her.  I don't think that she is in the total knee replacement category yet.  We'll see if this injection helps.  I'll see her back as needed.  Follow-Up Instructions: No Follow-up on file.   Orders:  No orders of the defined types were placed in this encounter.  No orders of the defined types were placed in this encounter.     Procedures: Large Joint Inj Date/Time:  01/24/2017 11:14 AM Performed by: Meredith Pel Authorized by: Meredith Pel   Consent Given by:  Patient Site marked: the procedure site was marked   Timeout: prior to procedure the correct patient, procedure, and site was verified   Indications:  Pain, joint swelling and diagnostic evaluation Location:  Knee Site:  R knee Prep: patient was prepped and draped in usual sterile fashion   Needle Size:  18 G Needle Length:  1.5 inches Approach:  Superolateral Ultrasound Guidance: No   Fluoroscopic Guidance: No   Arthrogram: No   Medications:  5 mL lidocaine 1 %; 4 mL bupivacaine 0.25 %; 40 mg methylPREDNISolone acetate 40 MG/ML Aspiration Attempted: Yes   Aspirate amount (mL):  35 Aspirate:  Yellow and clear Patient tolerance:  Patient tolerated the procedure well with no immediate complications     Clinical Data: No additional findings.  Objective: Vital Signs: There were no vitals taken for this visit.  Physical Exam:   Constitutional: Patient appears well-developed HEENT:  Head: Normocephalic Eyes:EOM are normal Neck: Normal range of motion Cardiovascular: Normal rate Pulmonary/chest: Effort normal Neurologic: Patient is alert Skin: Skin is warm Psychiatric: Patient has normal mood and affect    Ortho Exam : Orthopedic exam demonstrates normal gait and alignment slightly antalgic to the right effusion is present  extensor mechanism is intact collateral crucial ligament are stable prior lateral release noted no groin pain with internal rotation leg no other masses lymph or skin changes in the right knee region   Specialty Comments:  No specialty comments available.  Imaging: No results found.   PMFS History: Patient Active Problem List   Diagnosis Date Noted  . 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 Chest pain 02/02/2012  . Leukocytosis 02/02/2012  . Barrett's esophagus 04/28/2011  . FATTY LIVER DISEASE 05/24/2010  . SMOKER 09/08/2009  .  DEPRESSION 04/02/2008  . DIABETES MELLITUS 12/25/2007  . HYPERLIPIDEMIA 12/25/2007  . OBESITY 12/25/2007  . HYPERTENSION 12/25/2007  . INTERSTITIAL CYSTITIS 12/25/2007  . [place] DISEASE 12/25/2007   Past Medical History:  Diagnosis Date  . Anxiety   . Barrett esophagus    Dr. Olevia Perches in [facility]  . Chronic back pain    stenosis and DDD  . DDD (degenerative disc disease)    s/p back surgeries and steroid injxns  . Depression    takes Fluoxetine daily  . Diabetes mellitus    Victoza daily  . GERD (gastroesophageal reflux disease)    takes Nexium daily  . History of kidney stones   . Hyperlipidemia    hasn't been on Pravastatin in 6-7wks   . Hypertension    takes Amlodipine and HCTZ daily  . Interstitial cystitis   . Joint pain   . Joint swelling   . Muscle spasm    takes Ativan nightly as needed  . Numbness    weakness in right leg  . Pneumonia    fall 2014  . PONV (postoperative nausea and vomiting)   . Spinal headache    blood patch required   . Urinary frequency     Family History  Problem Relation Age of Onset  . Hypertension Father   . Colon polyps Father   . Heart disease Mother     ischemic  . Gallbladder disease Mother   . Other Mother     rectovaginal fistula  . Allergies Sister   . Asthma Sister     Past Surgical History:  Procedure Laterality Date  . adhesions removed from abdomen    . APPENDECTOMY    . BACK SURGERY  12/2007   x 6-fusion x 2  . CESAREAN SECTION  1988/1989  . CHOLECYSTECTOMY    . COLONOSCOPY  2011  . epidural injections    . KNEE ARTHROSCOPY Bilateral   . LAPAROSCOPIC LYSIS OF ADHESIONS N/A 04/15/2014   Procedure: LAPAROSCOPIC LYSIS OF ADHESION;  Surgeon: Joyice Faster. Cornett, MD;  Location: [place];  Service: General;  Laterality: N/A;  . LAPAROSCOPY N/A 04/15/2014   Procedure: LAPAROSCOPY DIAGNOSTIC;  Surgeon: Joyice Faster. Cornett, MD;  Location: [place];  Service: General;  Laterality: N/A;  . LEFT HEART CATHETERIZATION WITH  CORONARY ANGIOGRAM N/A 02/03/2012   Procedure: LEFT HEART CATHETERIZATION WITH CORONARY ANGIOGRAM;  Surgeon: Troy Sine, MD;  Location: [facility] CATH LAB;  Service: Cardiovascular;  Laterality: N/A;  . LUMBAR FUSION Right 03/24/2015   Procedure: SI joint fusion - right ;  Surgeon: Karie Chimera, MD;  Location: [place] NEURO ORS;  Service: Neurosurgery;  Laterality: Right;  SI joint fusion - right   . NECK SURGERY  12/2007  . OOPHORECTOMY    . TOTAL ABDOMINAL HYSTERECTOMY    . upper gi endoscopy     Social History   Occupational History  . on disability since March 2009 d/t back pain   . 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 Smoking status: Former Smoker    Packs/day: 0.30    Years: 17.00    Types: Cigarettes  . Smokeless tobacco: Never Used     Comment: quit smoking 2 1/2 yrs ago  . Alcohol use Yes     Comment: rarely  . Drug use: No  . Sexual activity: Yes

## 2017-01-25 ENCOUNTER — Ambulatory Visit (INDEPENDENT_AMBULATORY_CARE_PROVIDER_SITE_OTHER): Payer: Managed Care, Other (non HMO) | Admitting: Family

## 2017-02-02 ENCOUNTER — Ambulatory Visit (INDEPENDENT_AMBULATORY_CARE_PROVIDER_SITE_OTHER): Payer: Managed Care, Other (non HMO) | Admitting: Orthopedic Surgery

## 2017-12-20 ENCOUNTER — Ambulatory Visit (INDEPENDENT_AMBULATORY_CARE_PROVIDER_SITE_OTHER): Payer: Managed Care, Other (non HMO) | Admitting: Orthopedic Surgery

## 2017-12-20 ENCOUNTER — Encounter (INDEPENDENT_AMBULATORY_CARE_PROVIDER_SITE_OTHER): Payer: Self-pay | Admitting: Orthopedic Surgery

## 2017-12-20 DIAGNOSIS — M25561 Pain in right knee: Secondary | ICD-10-CM | POA: Diagnosis not present

## 2017-12-20 NOTE — Progress Notes (Signed)
Office Visit Note   Patient: [name]           Date of Birth: 29-Aug-1961           MRN: [id number] Visit Date: 12/20/2017 Requested by: Maylon Peppers, MD [address] [place], [place] [address] PCP: Maylon Peppers, MD  Subjective: Chief Complaint  Patient presents with  . Right Knee - Follow-up, Pain    HPI: [name] is a patient with right knee pain.  She has had previous injection and aspirations last year.  Had an MRI scan performed in 2018.  That showed effusion.  Has had previous arthroscopy and retinacular release.  She describes having a patellar dislocation episode.  She relocated herself 3 weeks ago.  Husband recently had heart surgery.              ROS: All systems reviewed are negative as they relate to the chief complaint within the history of present illness.  Patient denies  fevers or chills.   Assessment & Plan: Visit Diagnoses:  1. Right knee pain, unspecified chronicity     Plan: Impression is right knee pain with possible patellar dislocation.  By her description it sounds like the patella did dislocate laterally.  She has had a previous lateral release.  No effusion today.  Collateral and cruciates are stable.  Need MRI scan to determine if there is an operative problem in the knee.  It does not look like she has much arthritis in the knee.  Follow-Up Instructions: Return for after MRI.   Orders:  Orders Placed This Encounter  Procedures  . MR Knee Right w/o contrast   No orders of the defined types were placed in this encounter.     Procedures: No procedures performed   Clinical Data: No additional findings.  Objective: Vital Signs: There were no vitals taken for this visit.  Physical Exam:   Constitutional: Patient appears well-developed HEENT:  Head: Normocephalic Eyes:EOM are normal Neck: Normal range of motion Cardiovascular: Normal rate Pulmonary/chest: Effort normal Neurologic: Patient is alert Skin: Skin is  warm Psychiatric: Patient has normal mood and affect    Ortho Exam: Orthopedic exam demonstrates increased patellar mobility laterally and medially she has more medial apprehension and lateral apprehension.  No effusion.  Range of motion is full.  Extensor mechanism is intact.  Collateral and cruciate ligaments are stable in that right knee.  Not much in the way of quad atrophy.  Specialty Comments:  No specialty comments available.  Imaging: No results found.   PMFS History: Patient Active Problem List   Diagnosis Date Noted  . 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 Chest pain 02/02/2012  . Leukocytosis 02/02/2012  . Barrett's esophagus 04/28/2011  . FATTY LIVER DISEASE 05/24/2010  . SMOKER 09/08/2009  . DEPRESSION 04/02/2008  . DIABETES MELLITUS 12/25/2007  . HYPERLIPIDEMIA 12/25/2007  . OBESITY 12/25/2007  . HYPERTENSION 12/25/2007  . INTERSTITIAL CYSTITIS 12/25/2007  . [place] DISEASE 12/25/2007   Past Medical History:  Diagnosis Date  . Anxiety   . Barrett esophagus    Dr. Olevia Perches in GI  . Chronic back pain    stenosis and DDD  . DDD (degenerative disc disease)    s/p back surgeries and steroid injxns  . Depression    takes Fluoxetine daily  . Diabetes mellitus    Victoza daily  . GERD (gastroesophageal reflux disease)    takes Nexium daily  . History of kidney stones   .  Hyperlipidemia    hasn't been on Pravastatin in 6-7wks   . Hypertension    takes Amlodipine and HCTZ daily  . Interstitial cystitis   . Joint pain   . Joint swelling   . Muscle spasm    takes Ativan nightly as needed  . Numbness    weakness in right leg  . Pneumonia    fall 2014  . PONV (postoperative nausea and vomiting)   . Spinal headache    blood patch required   . Urinary frequency     Family History  Problem Relation Age of Onset  . Hypertension Father   . Colon polyps Father   . Heart disease Mother        ischemic  . Gallbladder disease Mother   . Other  Mother        rectovaginal fistula  . Allergies Sister   . Asthma Sister     Past Surgical History:  Procedure Laterality Date  . adhesions removed from abdomen    . APPENDECTOMY    . BACK SURGERY  12/2007   x 6-fusion x 2  . CESAREAN SECTION  1988/1989  . CHOLECYSTECTOMY    . COLONOSCOPY  2011  . epidural injections    . KNEE ARTHROSCOPY Bilateral   . LAPAROSCOPIC LYSIS OF ADHESIONS N/A 04/15/2014   Procedure: LAPAROSCOPIC LYSIS OF ADHESION;  Surgeon: Joyice Faster. Cornett, MD;  Location: [place];  Service: General;  Laterality: N/A;  . LAPAROSCOPY N/A 04/15/2014   Procedure: LAPAROSCOPY DIAGNOSTIC;  Surgeon: Joyice Faster. Cornett, MD;  Location: [place];  Service: General;  Laterality: N/A;  . LEFT HEART CATHETERIZATION WITH CORONARY ANGIOGRAM N/A 02/03/2012   Procedure: LEFT HEART CATHETERIZATION WITH CORONARY ANGIOGRAM;  Surgeon: Troy Sine, MD;  Location[facility] C[facility] LAB;  Service: Cardiovascular;  Laterality: N/A;  . LUMBAR FUSION Right 03/24/2015   Procedure: SI joint fusion - right ;  Surgeon: Karie Chimera, MD;  Location: [place] NEURO ORS;  Service: Neurosurgery;  Laterality: Right;  SI joint fusion - right   . NECK SURGERY  12/2007  . OOPHORECTOMY    . TOTAL ABDOMINAL HYSTERECTOMY    . upper gi endoscopy     Social History   Occupational History  . Occupation: on disability since March 2009 d/t back pain  . 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 Smoking status: Former Smoker    Packs/day: 0.30    Years: 17.00    Pack years: 5.10    Types: Cigarettes  . Smokeless tobacco: Never Used  . Tobacco comment: quit smoking 2 1/2 yrs ago  Substance and Sexual Activity  . Alcohol use: Yes    Comment: rarely  . Drug use: No  . Sexual activity: Yes

## 2017-12-28 ENCOUNTER — Ambulatory Visit
Admission: RE | Admit: 2017-12-28 | Discharge: 2017-12-28 | Disposition: A | Discharge status: Home / Self Care | Payer: Managed Care, Other (non HMO) | Source: Ambulatory Visit | Attending: Orthopedic Surgery | Admitting: Orthopedic Surgery

## 2017-12-28 DIAGNOSIS — M25561 Pain in right knee: Secondary | ICD-10-CM

## 2018-01-04 ENCOUNTER — Encounter (INDEPENDENT_AMBULATORY_CARE_PROVIDER_SITE_OTHER): Payer: Self-pay | Admitting: Orthopedic Surgery

## 2018-01-04 ENCOUNTER — Ambulatory Visit (INDEPENDENT_AMBULATORY_CARE_PROVIDER_SITE_OTHER): Payer: Managed Care, Other (non HMO) | Admitting: Orthopedic Surgery

## 2018-01-04 DIAGNOSIS — M25561 Pain in right knee: Secondary | ICD-10-CM | POA: Diagnosis not present

## 2018-01-04 NOTE — Progress Notes (Signed)
Office Visit Note   Patient: [name]           Date of Birth: 09-Jul-1961           MRN: [id number] Visit Date: 01/04/2018 Requested by: Maylon Peppers, MD [address] [place], [place] [address] PCP: Maylon Peppers, MD  Subjective: Chief Complaint  Patient presents with  . Right Knee - Follow-up    HPI: [name] presents for follow-up of right knee MRI scan.  She states that she had a patellar dislocation 3 weeks ago.  Scan shows mild effusion with degenerative changes but intact NPFL and intact menisci.  No definite bone bruising at this time.  She states that she has been doing reasonably well but are decision point today was for or against any type of surgical intervention.              ROS: All systems reviewed are negative as they relate to the chief complaint within the history of present illness.  Patient denies  fevers or chills.   Assessment & Plan: Visit Diagnoses:  1. Right knee pain, unspecified chronicity     Plan: Impression is right knee pain following some type of instability/dislocation event 3 weeks ago.  NPFL is intact and there is no bone bruising on that lateral femoral condyle or medial aspect of the patella.  Ligaments and menisci are intact.  Small effusion is present.  Unclear exactly what is going on in that right knee.  She has had a previous lateral retinacular release so instability could be a problem but there is no evidence of bone bruising.  If this knee was unstable then I would say it was more of a subluxation as opposed to a dislocation episode.  Would not favor any surgical intervention at this time.  Follow-up with me as needed  Follow-Up Instructions: Return if symptoms worsen or fail to improve.   Orders:  No orders of the defined types were placed in this encounter.  No orders of the defined types were placed in this encounter.     Procedures: No procedures performed   Clinical Data: No additional  findings.  Objective: Vital Signs: There were no vitals taken for this visit.  Physical Exam:   Constitutional: Patient appears well-developed HEENT:  Head: Normocephalic Eyes:EOM are normal Neck: Normal range of motion Cardiovascular: Normal rate Pulmonary/chest: Effort normal Neurologic: Patient is alert Skin: Skin is warm Psychiatric: Patient has normal mood and affect    Ortho Exam: Orthopedic exam is essentially unchanged.  She has improved gait and alignment today with good quad strength.  Specialty Comments:  No specialty comments available.  Imaging: No results found.   PMFS History: Patient Active Problem List   Diagnosis Date Noted  . 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 Chest pain 02/02/2012  . Leukocytosis 02/02/2012  . Barrett's esophagus 04/28/2011  . FATTY LIVER DISEASE 05/24/2010  . SMOKER 09/08/2009  . DEPRESSION 04/02/2008  . DIABETES MELLITUS 12/25/2007  . HYPERLIPIDEMIA 12/25/2007  . OBESITY 12/25/2007  . HYPERTENSION 12/25/2007  . INTERSTITIAL CYSTITIS 12/25/2007  . [place] DISEASE 12/25/2007   Past Medical History:  Diagnosis Date  . Anxiety   . Barrett esophagus    Dr. Olevia Perches in GI  . Chronic back pain    stenosis and DDD  . DDD (degenerative disc disease)    s/p back surgeries and steroid injxns  . Depression    takes Fluoxetine daily  . Diabetes mellitus  Victoza daily  . GERD (gastroesophageal reflux disease)    takes Nexium daily  . History of kidney stones   . Hyperlipidemia    hasn't been on Pravastatin in 6-7wks   . Hypertension    takes Amlodipine and HCTZ daily  . Interstitial cystitis   . Joint pain   . Joint swelling   . Muscle spasm    takes Ativan nightly as needed  . Numbness    weakness in right leg  . Pneumonia    fall 2014  . PONV (postoperative nausea and vomiting)   . Spinal headache    blood patch required   . Urinary frequency     Family History  Problem Relation Age of Onset  .  Hypertension Father   . Colon polyps Father   . Heart disease Mother        ischemic  . Gallbladder disease Mother   . Other Mother        rectovaginal fistula  . Allergies Sister   . Asthma Sister     Past Surgical History:  Procedure Laterality Date  . adhesions removed from abdomen    . APPENDECTOMY    . BACK SURGERY  12/2007   x 6-fusion x 2  . CESAREAN SECTION  1988/1989  . CHOLECYSTECTOMY    . COLONOSCOPY  2011  . epidural injections    . KNEE ARTHROSCOPY Bilateral   . LAPAROSCOPIC LYSIS OF ADHESIONS N/A 04/15/2014   Procedure: LAPAROSCOPIC LYSIS OF ADHESION;  Surgeon: Joyice Faster. Cornett, MD;  Location: [facility];  Service: General;  Laterality: N/A;  . LAPAROSCOPY N/A 04/15/2014   Procedure: LAPAROSCOPY DIAGNOSTIC;  Surgeon: Joyice Faster. Cornett, MD;  Location: [facility];  Service: General;  Laterality: N/A;  . LEFT HEART CATHETERIZATION WITH CORONARY ANGIOGRAM N/A 02/03/2012   Procedure: LEFT HEART CATHETERIZATION WITH CORONARY ANGIOGRAM;  Surgeon: Troy Sine, MD;  Location: [facility] CATH LAB;  Service: Cardiovascular;  Laterality: N/A;  . LUMBAR FUSION Right 03/24/2015   Procedure: SI joint fusion - right ;  Surgeon: Karie Chimera, MD;  Location: [facility];  Service: Neurosurgery;  Laterality: Right;  SI joint fusion - right   . NECK SURGERY  12/2007  . OOPHORECTOMY    . TOTAL ABDOMINAL HYSTERECTOMY    . upper gi endoscopy     Social History   Occupational History  . Occupation: on disability since March 2009 d/t back pain  . 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 Smoking status: Former Smoker    Packs/day: 0.30    Years: 17.00    Pack years: 5.10    Types: Cigarettes  . Smokeless tobacco: Never Used  . Tobacco comment: quit smoking 2 1/2 yrs ago  Substance and Sexual Activity  . Alcohol use: Yes    Comment: rarely  . Drug use: No  . Sexual activity: Yes

## 2018-04-05 ENCOUNTER — Emergency Department (HOSPITAL_COMMUNITY)
Admission: EM | Admit: 2018-04-05 | Discharge: 2018-04-05 | Disposition: A | Discharge status: Home / Self Care | Payer: Medicare Other | Attending: Emergency Medicine | Admitting: Emergency Medicine

## 2018-04-05 ENCOUNTER — Other Ambulatory Visit: Payer: Self-pay

## 2018-04-05 ENCOUNTER — Encounter (HOSPITAL_COMMUNITY): Payer: Self-pay | Admitting: Emergency Medicine

## 2018-04-05 ENCOUNTER — Emergency Department (HOSPITAL_COMMUNITY): Payer: Medicare Other

## 2018-04-05 DIAGNOSIS — Z79899 Other long term (current) drug therapy: Secondary | ICD-10-CM | POA: Insufficient documentation

## 2018-04-05 DIAGNOSIS — E876 Hypokalemia: Secondary | ICD-10-CM | POA: Diagnosis not present

## 2018-04-05 DIAGNOSIS — Z87891 Personal history of nicotine dependence: Secondary | ICD-10-CM | POA: Diagnosis not present

## 2018-04-05 DIAGNOSIS — Z794 Long term (current) use of insulin: Secondary | ICD-10-CM | POA: Diagnosis not present

## 2018-04-05 DIAGNOSIS — R1084 Generalized abdominal pain: Secondary | ICD-10-CM | POA: Insufficient documentation

## 2018-04-05 DIAGNOSIS — R109 Unspecified abdominal pain: Secondary | ICD-10-CM

## 2018-04-05 DIAGNOSIS — E119 Type 2 diabetes mellitus without complications: Secondary | ICD-10-CM | POA: Insufficient documentation

## 2018-04-05 DIAGNOSIS — I10 Essential (primary) hypertension: Secondary | ICD-10-CM | POA: Diagnosis not present

## 2018-04-05 LAB — BASIC METABOLIC PANEL
Anion gap: 9 (ref 5–15)
BUN: 11 mg/dL (ref 6–20)
CALCIUM: 9.4 mg/dL (ref 8.9–10.3)
CHLORIDE: 100 mmol/L — AB (ref 101–111)
CO2: 31 mmol/L (ref 22–32)
CREATININE: 0.77 mg/dL (ref 0.44–1.00)
GFR calc non Af Amer: >60 mL/min (ref >60)
GLUCOSE: 151 mg/dL — AB (ref 65–99)
Potassium: 3.1 mmol/L — ABNORMAL LOW (ref 3.5–5.1)
Sodium: 140 mmol/L (ref 135–145)

## 2018-04-05 LAB — URINALYSIS, ROUTINE W REFLEX MICROSCOPIC
BILIRUBIN URINE: NEGATIVE
Ketones, ur: NEGATIVE mg/dL
LEUKOCYTES UA: NEGATIVE
NITRITE: NEGATIVE
PROTEIN: NEGATIVE mg/dL
Specific Gravity, Urine: 1.039 — ABNORMAL HIGH (ref 1.005–1.030)
pH: 5 (ref 5.0–8.0)

## 2018-04-05 LAB — CBC
HEMATOCRIT: 44.1 % (ref 36.0–46.0)
Hemoglobin: 14.9 g/dL (ref 12.0–15.0)
MCH: 29.6 pg (ref 26.0–34.0)
MCHC: 33.8 g/dL (ref 30.0–36.0)
MCV: 87.5 fL (ref 78.0–100.0)
Platelets: 390 10*3/uL (ref 150–400)
RBC: 5.04 MIL/uL (ref 3.87–5.11)
RDW: 12.3 % (ref 11.5–15.5)
WBC: 9.2 10*3/uL (ref 4.0–10.5)

## 2018-04-05 MED ORDER — POTASSIUM CHLORIDE ER 10 MEQ PO TBCR: 20.0000 meq | EXTENDED_RELEASE_TABLET | Freq: Every day | ORAL | 0 refills | Status: DC

## 2018-04-05 MED ORDER — OXYCODONE-ACETAMINOPHEN 5-325 MG PO TABS
1.0000 | ORAL_TABLET | Freq: Once | ORAL | Status: AC
  Administered 2018-04-05: 1 via ORAL
  Filled 2018-04-05: qty 1

## 2018-04-05 MED ORDER — ONDANSETRON 4 MG PO TBDP
4.0000 mg | ORAL_TABLET | Freq: Once | ORAL | Status: AC
  Administered 2018-04-05: 4 mg via ORAL
  Filled 2018-04-05: qty 1

## 2018-04-05 NOTE — ED Notes (Signed)
Pt verbalizes understanding of d/c instructions. Pt received prescriptions. Pt ambulatory at d/c with all belongings.  

## 2018-04-05 NOTE — ED Provider Notes (Signed)
[facility] EMERGENCY DEPARTMENT Provider Note   CSN: [id number] Arrival date & time: 04/05/18  1238     History   Chief Complaint Chief Complaint  Patient presents with  . Flank Pain    HPI [name] is a 57 y.o. female.  HPI   57 year old female presents today with complaints of right-sided flank pain.  Patient notes due to history of pain.  She reports this feels sharp in nature.  Reports subjective fever at home with episodes of nonbloody emesis.  She notes this feels similar to previous episodes of kidney stones but has not had an episode in over a year.  Patient denies any change in bowel movements, notes some pressure with urination and slightly weaker stream.     Past Medical History:  Diagnosis Date  . Anxiety   . Barrett esophagus    Dr. Olevia Perches in GI  . Chronic back pain    stenosis and DDD  . DDD (degenerative disc disease)    s/p back surgeries and steroid injxns  . Depression    takes Fluoxetine daily  . Diabetes mellitus    Victoza daily  . GERD (gastroesophageal reflux disease)    takes Nexium daily  . History of kidney stones   . Hyperlipidemia    hasn't been on Pravastatin in 6-7wks   . Hypertension    takes Amlodipine and HCTZ daily  . Interstitial cystitis   . Joint pain   . Joint swelling   . Muscle spasm    takes Ativan nightly as needed  . Numbness    weakness in right leg  . Pneumonia    fall 2014  . PONV (postoperative nausea and vomiting)   . Spinal headache    blood patch required   . Urinary frequency     Patient Active Problem List   Diagnosis Date Noted  . 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 Chest pain 02/02/2012  . Leukocytosis 02/02/2012  . Barrett's esophagus 04/28/2011  . FATTY LIVER DISEASE 05/24/2010  . SMOKER 09/08/2009  . DEPRESSION 04/02/2008  . DIABETES MELLITUS 12/25/2007  . HYPERLIPIDEMIA 12/25/2007  . OBESITY 12/25/2007  . HYPERTENSION 12/25/2007  . INTERSTITIAL CYSTITIS  12/25/2007  . DEGENERATIVE DISC DISEASE 12/25/2007    Past Surgical History:  Procedure Laterality Date  . adhesions removed from abdomen    . APPENDECTOMY    . BACK SURGERY  12/2007   x 6-fusion x 2  . CESAREAN SECTION  1988/1989  . CHOLECYSTECTOMY    . COLONOSCOPY  2011  . epidural injections    . KNEE ARTHROSCOPY Bilateral   . LAPAROSCOPIC LYSIS OF ADHESIONS N/A 04/15/2014   Procedure: LAPAROSCOPIC LYSIS OF ADHESION;  Surgeon: Joyice Faster. Cornett, MD;  Location: [facility];  Service: General;  Laterality: N/A;  . LAPAROSCOPY N/A 04/15/2014   Procedure: LAPAROSCOPY DIAGNOSTIC;  Surgeon: Joyice Faster. Cornett, MD;  Location: [facility];  Service: General;  Laterality: N/A;  . LEFT HEART CATHETERIZATION WITH CORONARY ANGIOGRAM N/A 02/03/2012   Procedure: LEFT HEART CATHETERIZATION WITH CORONARY ANGIOGRAM;  Surgeon: Troy Sine, MD;  Location: [facility] CATH LAB;  Service: Cardiovascular;  Laterality: N/A;  . LUMBAR FUSION Right 03/24/2015   Procedure: SI joint fusion - right ;  Surgeon: Karie Chimera, MD;  Location: [facility];  Service: Neurosurgery;  Laterality: Right;  SI joint fusion - right   . NECK SURGERY  12/2007  . OOPHORECTOMY    . TOTAL ABDOMINAL HYSTERECTOMY    . upper  gi endoscopy       OB History   None      Home Medications    Prior to Admission medications   Medication Sig Start Date End Date Taking? Authorizing Provider  ACCU-CHEK AVIVA PLUS test strip TEST 3 TIMES DAILY 03/06/12   Tanda Rockers, MD  amLODipine (NORVASC) 5 MG tablet Take 5 mg by mouth daily.    [provider]  cefpodoxime (VANTIN) 200 MG tablet Take 1 tablet (200 mg total) by mouth 2 (two) times daily. 03/17/16   Forde Dandy, MD  Dulaglutide (TRULICITY) 0.27 XA/1.2IN SOPN Inject 0.75 mg into the skin every 7 (seven) days.    [provider]  esomeprazole (NEXIUM) 40 MG capsule Take 1 capsule (40 mg total) by mouth daily before breakfast. 07/09/12   Lafayette Dragon, MD  Evolocumab (REPATHA) 140  MG/ML SOSY Inject 140 mg into the skin every 14 (fourteen) days.    [provider]  FLUoxetine (PROZAC) 40 MG capsule Take 1 capsule (40 mg total) by mouth daily. NEEDS APPT FOR REFILLS 07/10/12   Tanda Rockers, MD  hydrochlorothiazide (HYDRODIURIL) 25 MG tablet Take 1 tablet (25 mg total) by mouth daily. NEEDS APPT FOR REFILLS 07/10/12   Tanda Rockers, MD  HYDROcodone-acetaminophen [facility]) 10-325 MG per tablet Take 1 tablet by mouth 2 (two) times daily as needed for pain.    [provider]  insulin detemir (LEVEMIR) 100 UNIT/ML injection Inject 30-35 Units into the skin 2 (two) times daily. 35 units in the morning 30 units in the evening    [provider]  Liraglutide (VICTOZA) 18 MG/3ML SOPN Inject 1.8 mg into the skin daily.    [provider]  LORazepam (ATIVAN) 1 MG tablet Take 0.5 mg by mouth at bedtime as needed (muscle spasms).    [provider]  potassium chloride (K-DUR) 10 MEQ tablet Take 2 tablets (20 mEq total) by mouth daily. 04/05/18   Okey Regal, PA-C    Family History Family History  Problem Relation Age of Onset  . Hypertension Father   . Colon polyps Father   . Heart disease Mother        ischemic  . Gallbladder disease Mother   . Other Mother        rectovaginal fistula  . Allergies Sister   . Asthma Sister     Social History Social History   Tobacco Use  . Smoking status: Former Smoker    Packs/day: 0.30    Years: 17.00    Pack years: 5.10    Types: Cigarettes  . Smokeless tobacco: Never Used  . Tobacco comment: quit smoking 2 1/2 yrs ago  Substance Use Topics  . Alcohol use: Yes    Comment: rarely  . Drug use: No     Allergies   Latex; Penicillins; Erythromycin; Ofloxacin; Sulfonamide derivatives; and Azithromycin   Review of Systems Review of Systems  All other systems reviewed and are negative.    Physical Exam Updated Vital Signs BP 130/88   Pulse 82   Temp 99.2 F (37.3 C) (Oral)  Comment: Simultaneous filing. User may not have seen previous data. Comment (Src): Simultaneous filing. User may not have seen previous data.  Resp 16   SpO2 99%   Physical Exam  Constitutional: She is oriented to person, place, and time. She appears well-developed and well-nourished.  HENT:  Head: Normocephalic and atraumatic.  Eyes: Pupils are equal, round, and reactive to light. Conjunctivae  are normal. Right eye exhibits no discharge. Left eye exhibits no discharge. No scleral icterus.  Neck: Normal range of motion. No JVD present. No tracheal deviation present.  Pulmonary/Chest: Effort normal. No stridor.  TTP of right mid abdomen-remainder of exam nontender  Musculoskeletal:  Exquisite tenderness palpation of right lateral lumbar musculature  Neurological: She is alert and oriented to person, place, and time. Coordination normal.  Psychiatric: She has a normal mood and affect. Her behavior is normal. Judgment and thought content normal.  Nursing note and vitals reviewed.    ED Treatments / Results  Labs (all labs ordered are listed, but only abnormal results are displayed) Labs Reviewed  BASIC METABOLIC PANEL - Abnormal; Notable for the following components:      Result Value   Potassium 3.1 (*)    Chloride 100 (*)    Glucose, Bld 151 (*)    All other components within normal limits  URINALYSIS, ROUTINE W REFLEX MICROSCOPIC - Abnormal; Notable for the following components:   APPearance HAZY (*)    Specific Gravity, Urine 1.039 (*)    Glucose, UA >=500 (*)    Hgb urine dipstick SMALL (*)    Bacteria, UA RARE (*)    All other components within normal limits  CBC    EKG None  Radiology Ct Renal Stone Study  Result Date: 04/05/2018 CLINICAL DATA:  C/o sharp right flank pain that started a few days ago HX kidney stones, HTN, back sx EXAM: CT ABDOMEN AND PELVIS WITHOUT CONTRAST TECHNIQUE: Multidetector CT imaging of the abdomen and pelvis was performed following the  standard protocol without IV contrast. COMPARISON:  03/17/2016 FINDINGS: Lower chest: No pulmonary nodules, pleural effusions, or infiltrates. Heart size is normal. No pericardial effusion or significant coronary artery calcifications. Hepatobiliary: The liver is diffusely low attenuation. Status post cholecystectomy. Pancreas: Unremarkable. No pancreatic ductal dilatation or surrounding inflammatory changes. Spleen: Normal in size without focal abnormality. Adrenals/Urinary Tract: Adrenal glands are unremarkable. Kidneys are normal, without renal calculi, focal lesion, or hydronephrosis. Bladder is unremarkable. Stomach/Bowel: The stomach and small bowel loops are normal in appearance. Loops of colon are normal in appearance. There is a moderate stool burden. Previous appendectomy. Vascular/Lymphatic: There is minimal atherosclerotic calcification of the abdominal aorta without associated aneurysm. No retroperitoneal or mesenteric adenopathy. Reproductive: Hysterectomy.  No adnexal mass. Other: No free pelvic fluid. Anterior abdominal wall is unremarkable. Musculoskeletal: No acute or significant osseous findings. Status post transpedicular fusion L3-L5 and RIGHT SI joint fusion. IMPRESSION: 1. Hepatic steatosis. 2. Appendectomy, hysterectomy, cholecystectomy. 3. Postoperative changes in the lumbosacral spine. Electronically Signed   By: Nolon Nations M.D.   On: 04/05/2018 14:46    Procedures Procedures (including critical care time)  Medications Ordered in ED Medications  oxyCODONE-acetaminophen (PERCOCET/ROXICET) 5-325 MG per tablet 1 tablet (1 tablet Oral Given 04/05/18 1337)  ondansetron (ZOFRAN-ODT) disintegrating tablet 4 mg (4 mg Oral Given 04/05/18 1553)     Initial Impression / Assessment and Plan / ED Course  I have reviewed the triage vital signs and the nursing notes.  Pertinent labs & imaging results that were available during my care of the patient were reviewed by me and considered in  my medical decision making (see chart for details).     Labs: Urinalysis, BMP, CBC  Imaging: CT renal  Consults:  Therapeutics:  Discharge Meds: Potassium  Assessment/Plan: 57 year old female presents today with complaints of flank pain.  CT reassuring with no acute abnormalities.  Labs reassuring with the exception of  potassium.  She notes she has been hypokalemic in the past, also taking HCTZ. Pt discharged with outpatient follow-up for reassessment of hyperkalemia, potassium, strict return precautions.  Patient has no signs of significant intra-abdominal infection.  Strict return precautions given.  She verbalized understanding and agreement to today's plan had no further questions or concerns at time discharge.   Final Clinical Impressions(s) / ED Diagnoses   Final diagnoses:  Right flank pain  Hypokalemia    ED Discharge Orders        Ordered    potassium chloride (K-DUR) 10 MEQ tablet  Daily     04/05/18 1636       Okey Regal, PA-C 04/05/18 2025    Charlesetta Shanks, MD 04/06/18 1400

## 2018-04-05 NOTE — Discharge Instructions (Addendum)
Please read attached information. If you experience any new or worsening signs or symptoms please return to the emergency room for evaluation. Please follow-up with your primary care provider or specialist as discussed. Please use medication prescribed only as directed and discontinue taking if you have any concerning signs or symptoms.   °

## 2018-04-05 NOTE — ED Triage Notes (Signed)
Patient complains of sharp right flank pain that started a few days ago. History of kidney stones, most recent earlier this year and states this feels exactly the same. Reports weaker urine stream and pressure in groin when urinating. Patient alert, oriented, and in no apparent distress at this time.

## 2018-04-05 NOTE — ED Provider Notes (Signed)
Patient placed in Quick Look pathway, seen and evaluated   Chief Complaint: Right flank pain  HPI:   Pt with right flank pain that started about 3 days ago and has been progressively worsening, pain becomes sharp intermittently and radiates to groin, pt reports dysuria and sensation of decreased urinary stream and pressure, has not noted hematuria, took ibuprofen and leftover Vicodin without improvement. Sx similar to previous kidney stones, hasn't had one in > 1 year. Reports fever of 101 at home, fever episodes of non-b;oody emesis, no other abdominal pain. No CP or SOB  ROS: + right flank pain, N/V, fevers, - CP or SOB, dysuria  Physical Exam:   Gen: No distress  Neuro: Awake and Alert  Skin: Warm    Focused Exam: + CVA tenderness on right, mild right abdominal tenderness w/o guarding, abdomen non-distended, BS present throughout.   Initiation of care has begun. The patient has been counseled on the process, plan, and necessity for staying for the completion/evaluation, and the remainder of the medical screening examination    [name] 04/05/18 1403    Noemi Chapel, MD 04/06/18 [phone]

## 2018-06-06 ENCOUNTER — Ambulatory Visit (INDEPENDENT_AMBULATORY_CARE_PROVIDER_SITE_OTHER): Payer: Medicare Other | Admitting: Physician Assistant

## 2018-06-06 ENCOUNTER — Ambulatory Visit (INDEPENDENT_AMBULATORY_CARE_PROVIDER_SITE_OTHER): Payer: Medicare Other

## 2018-06-06 ENCOUNTER — Encounter (INDEPENDENT_AMBULATORY_CARE_PROVIDER_SITE_OTHER): Payer: Self-pay | Admitting: Physician Assistant

## 2018-06-06 DIAGNOSIS — M79671 Pain in right foot: Secondary | ICD-10-CM | POA: Diagnosis not present

## 2018-06-06 DIAGNOSIS — M25561 Pain in right knee: Secondary | ICD-10-CM

## 2018-06-06 DIAGNOSIS — G8929 Other chronic pain: Secondary | ICD-10-CM | POA: Insufficient documentation

## 2018-06-06 MED ORDER — DICLOFENAC SODIUM 1 % TD GEL: 2.0000 g | Freq: Four times a day (QID) | TRANSDERMAL | 0 refills | Status: DC

## 2018-06-06 NOTE — Progress Notes (Signed)
Office Visit Note   Patient: [name]           Date of Birth: Oct 06, 1961           MRN: [id number] Visit Date: 06/06/2018              Requested by: Maylon Peppers, MD [address] [place], [place] [address] PCP: Maylon Peppers, MD   Assessment & Plan: Visit Diagnoses:  1. Pain of right heel     Plan: Impression is right Achilles tendinitis.  I have reinforced to the patient to wear a rigid shoe for the next several weeks.  She is to rest, ice and elevate as much as possible.  We will also send her to physical therapy to work on Achilles stretching.  She will follow-up as needed.  Call with concerns or questions.  Follow-Up Instructions: No follow-ups on file.   Orders:  Orders Placed This Encounter  Procedures  . XR Os Calcis Right   Meds ordered this encounter  Medications  . diclofenac sodium (VOLTAREN) 1 % GEL    Sig: Apply 2 g topically 4 (four) times daily.    Dispense:  1 Tube    Refill:  0      Procedures: No procedures performed   Clinical Data: No additional findings.   Subjective: Chief Complaint  Patient presents with  . Right Knee - Pain  . Right Foot - Pain    HEEL PAIN     HPI patient is a pleasant 57 year old female who presents to our clinic today with right heel pain.  This began a few weeks ago but has significantly worsened over the past week.  No known injury, but she does note that she has been at the beach for the past week walking on sand and wearing flip-flops.  All the pain she has is retrocalcaneal.  Worse when she first gets up from a seated position as well as with walking and with dorsiflexion of the ankle.  Pain is relieved with rest.  No numbness, tingling or burning.  Review of Systems as detailed in HPI.  All others reviewed and are negative.   Objective: Vital Signs: There were no vitals taken for this visit.  Physical Exam well-developed well-nourished female in no acute distress.  Alert and  oriented x3.  Ortho Exam examination of the right heel reveals marked tenderness to the distal Achilles.  Tenderness to the Achilles bursa.  No tenderness to the plantar fascial attachment of the heel.  Increased pain with dorsiflexion.  Specialty Comments:  No specialty comments available.  Imaging: Xr Os Calcis Right  Result Date: 06/06/2018 Marked retrocalcaneal and plantar fascial insertion spurs.    PMFS History: Patient Active Problem List   Diagnosis Date Noted  . Chronic pain of right knee 06/06/2018  . Pain of right heel 06/06/2018  . 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 Chest pain 02/02/2012  . Leukocytosis 02/02/2012  . Barrett's esophagus 04/28/2011  . FATTY LIVER DISEASE 05/24/2010  . SMOKER 09/08/2009  . DEPRESSION 04/02/2008  . DIABETES MELLITUS 12/25/2007  . HYPERLIPIDEMIA 12/25/2007  . OBESITY 12/25/2007  . HYPERTENSION 12/25/2007  . INTERSTITIAL CYSTITIS 12/25/2007  . [place] DISEASE 12/25/2007   Past Medical History:  Diagnosis Date  . Anxiety   . Barrett esophagus    Dr. Olevia Perches in GI  . Chronic back pain    stenosis and DDD  . DDD (degenerative disc disease)    s/p back  surgeries and steroid injxns  . Depression    takes Fluoxetine daily  . Diabetes mellitus    Victoza daily  . GERD (gastroesophageal reflux disease)    takes Nexium daily  . History of kidney stones   . Hyperlipidemia    hasn't been on Pravastatin in 6-7wks   . Hypertension    takes Amlodipine and HCTZ daily  . Interstitial cystitis   . Joint pain   . Joint swelling   . Muscle spasm    takes Ativan nightly as needed  . Numbness    weakness in right leg  . Pneumonia    fall 2014  . PONV (postoperative nausea and vomiting)   . Spinal headache    blood patch required   . Urinary frequency     Family History  Problem Relation Age of Onset  . Hypertension Father   . Colon polyps Father   . Heart disease Mother        ischemic  . Gallbladder disease Mother    . Other Mother        rectovaginal fistula  . Allergies Sister   . Asthma Sister     Past Surgical History:  Procedure Laterality Date  . adhesions removed from abdomen    . APPENDECTOMY    . BACK SURGERY  12/2007   x 6-fusion x 2  . CESAREAN SECTION  1988/1989  . CHOLECYSTECTOMY    . COLONOSCOPY  2011  . epidural injections    . KNEE ARTHROSCOPY Bilateral   . LAPAROSCOPIC LYSIS OF ADHESIONS N/A 04/15/2014   Procedure: LAPAROSCOPIC LYSIS OF ADHESION;  Surgeon: Joyice Faster. Cornett, MD;  Location: [facility];  Service: General;  Laterality: N/A;  . LAPAROSCOPY N/A 04/15/2014   Procedure: LAPAROSCOPY DIAGNOSTIC;  Surgeon: Joyice Faster. Cornett, MD;  Location: [facility];  Service: General;  Laterality: N/A;  . LEFT HEART CATHETERIZATION WITH CORONARY ANGIOGRAM N/A 02/03/2012   Procedure: LEFT HEART CATHETERIZATION WITH CORONARY ANGIOGRAM;  Surgeon: Troy Sine, MD;  Location: [facility] CATH LAB;  Service: Cardiovascular;  Laterality: N/A;  . LUMBAR FUSION Right 03/24/2015   Procedure: SI joint fusion - right ;  Surgeon: Karie Chimera, MD;  Location: [place] NEURO [facility]S;  Service: Neurosurgery;  Laterality: Right;  SI joint fusion - right   . NECK SURGERY  12/2007  . OOPHORECTOMY    . TOTAL ABDOMINAL HYSTERECTOMY    . upper gi endoscopy     Social History   Occupational History  . Occupation: on disability since March 2009 d/t back pain  . 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 Smoking status: Former Smoker    Packs/day: 0.30    Years: 17.00    Pack years: 5.10    Types: Cigarettes  . Smokeless tobacco: Never Used  . Tobacco comment: quit smoking 2 1/2 yrs ago  Substance and Sexual Activity  . Alcohol use: Yes    Comment: rarely  . Drug use: No  . Sexual activity: Yes

## 2018-09-20 ENCOUNTER — Telehealth (INDEPENDENT_AMBULATORY_CARE_PROVIDER_SITE_OTHER): Payer: Self-pay

## 2018-09-20 NOTE — Telephone Encounter (Signed)
Patient would like to know if she can be worked in to see Dr. Marlou Sa before her appointment on Wednesday, 09/26/2018.  Stated that her right knee is swollen and painful and needs to be drained.  Cb# is [phone].  Please advise.  Thank you.

## 2018-09-20 NOTE — Telephone Encounter (Signed)
Unable to work patient in with Dr Marlou Sa due to high patient volume in Friday morning clinic and Monday afternoon clinic-I did put her on schedule to see Dr Junius Roads on Friday a.m.

## 2018-09-21 ENCOUNTER — Ambulatory Visit (INDEPENDENT_AMBULATORY_CARE_PROVIDER_SITE_OTHER): Payer: Medicare Other | Admitting: Family Medicine

## 2018-09-21 ENCOUNTER — Encounter (INDEPENDENT_AMBULATORY_CARE_PROVIDER_SITE_OTHER): Payer: Self-pay | Admitting: Family Medicine

## 2018-09-21 DIAGNOSIS — M25561 Pain in right knee: Secondary | ICD-10-CM

## 2018-09-21 DIAGNOSIS — G8929 Other chronic pain: Secondary | ICD-10-CM | POA: Diagnosis not present

## 2018-09-21 MED ORDER — METHYLPREDNISOLONE ACETATE 40 MG/ML IJ SUSP
40.0000 mg | INTRAMUSCULAR | Status: AC | PRN
  Administered 2018-09-21: 40 mg via INTRA_ARTICULAR

## 2018-09-21 MED ORDER — LIDOCAINE HCL 1 % IJ SOLN
5.0000 mL | INTRAMUSCULAR | Status: AC | PRN
  Administered 2018-09-21: 5 mL

## 2018-09-21 NOTE — Progress Notes (Signed)
Office Visit Note   Patient: [name]           Date of Birth: [birth year]           MRN: [id number] Visit Date: 09/21/2018 Requested by: Maylon Peppers, MD [address] [place], [place] [address] PCP: Maylon Peppers, MD  Subjective: Chief Complaint  Patient presents with  . Right Knee - Pain, Edema    Knee gives way, hurts to fully extend knee    HPI: She is a 57 year old with right knee pain.  History of patellar instability.  She is currently in the middle of a move and he has aggravated her knee quite a bit.  Her knee has popped out a few times.  She would like to have it aspirated and injected.              ROS: Noncontributory  Objective: Vital Signs: There were no vitals taken for this visit.  Physical Exam:  Right knee: She has what feels like a small ganglion cyst at the superolateral aspect of her knee.  It hurts, but it is not the source of her pain.  She has pain with patella apprehension and tenderness on the lateral patellofemoral joint.  1+ effusion with no warmth or erythema.  Imaging: None today.  Assessment & Plan: 1.  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  PSO brace for support.  Follow-up as needed.   Follow-Up Instructions: No follow-ups on file.      Procedures: Large Joint Inj on 09/21/2018 11:02 AM Indications: joint swelling Details: 18 G 1.5 in needle, lateral approach  Arthrogram: No  Medications: 5 mL lidocaine 1 %; 40 mg methylPREDNISolone acetate 40 MG/ML Aspirate: clear  Aspirated 20 cc clear yellow fluid, then injected 40 mg methylprednisolone.     No notes on file    PMFS History: Patient Active Problem List   Diagnosis Date Noted  . Chronic pain of right knee 06/06/2018  . Pain of right heel 06/06/2018  . 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 Chest pain 02/02/2012  . Leukocytosis 02/02/2012  . Barrett's esophagus  04/28/2011  . FATTY LIVER DISEASE 05/24/2010  . SMOKER 09/08/2009  . DEPRESSION 04/02/2008  . DIABETES MELLITUS 12/25/2007  . HYPERLIPIDEMIA 12/25/2007  . OBESITY 12/25/2007  . HYPERTENSION 12/25/2007  . INTERSTITIAL CYSTITIS 12/25/2007  . [place] DISEASE 12/25/2007   Past Medical History:  Diagnosis Date  . Anxiety   . Barrett esophagus    Dr. Olevia Perches in GI  . Chronic back pain    stenosis and DDD  . DDD (degenerative disc disease)    s/p back surgeries and steroid injxns  . Depression    takes Fluoxetine daily  . Diabetes mellitus    Victoza daily  . GERD (gastroesophageal reflux disease)    takes Nexium daily  . History of kidney stones   . Hyperlipidemia    hasn't been on Pravastatin in 6-7wks   . Hypertension    takes Amlodipine and HCTZ daily  . Interstitial cystitis   . Joint pain   . Joint swelling   . Muscle spasm    takes Ativan nightly as needed  . Numbness    weakness in right leg  . Pneumonia    fall 2014  . PONV (postoperative nausea and vomiting)   . Spinal headache    blood patch required   . Urinary frequency  Family History  Problem Relation Age of Onset  . Hypertension Father   . Colon polyps Father   . Heart disease Mother        ischemic  . Gallbladder disease Mother   . Other Mother        rectovaginal fistula  . Allergies Sister   . Asthma Sister     Past Surgical History:  Procedure Laterality Date  . adhesions removed from abdomen    . APPENDECTOMY    . BACK SURGERY  12/2007   x 6-fusion x 2  . CESAREAN SECTION  1988/1989  . CHOLECYSTECTOMY    . COLONOSCOPY  2011  . epidural injections    . KNEE ARTHROSCOPY Bilateral   . LAPAROSCOPIC LYSIS OF ADHESIONS N/A 04/15/2014   Procedure: LAPAROSCOPIC LYSIS OF ADHESION;  Surgeon: Joyice Faster. Cornett, MD;  Location: [facility];  Service: General;  Laterality: N/A;  . LAPAROSCOPY N/A 04/15/2014   Procedure: LAPAROSCOPY DIAGNOSTIC;  Surgeon: Joyice Faster. Cornett, MD;  Location: [facility];   Service: General;  Laterality: N/A;  . LEFT HEART CATHETERIZATION WITH CORONARY ANGIOGRAM N/A 02/03/2012   Procedure: LEFT HEART CATHETERIZATION WITH CORONARY ANGIOGRAM;  Surgeon: Troy Sine, MD;  Location: [facility] CATH LAB;  Service: Cardiovascular;  Laterality: N/A;  . LUMBAR FUSION Right 03/24/2015   Procedure: SI joint fusion - right ;  Surgeon: Karie Chimera, MD;  Location: [place] NEURO ORS;  Service: Neurosurgery;  Laterality: Right;  SI joint fusion - right   . NECK SURGERY  12/2007  . OOPHORECTOMY    . TOTAL ABDOMINAL HYSTERECTOMY    . upper gi endoscopy     Social History   Occupational History  . Occupation: on disability since March 2009 d/t back pain  . 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 Smoking status: Former Smoker    Packs/day: 0.30    Years: 17.00    Pack years: 5.10    Types: Cigarettes  . Smokeless tobacco: Never Used  . Tobacco comment: quit smoking 2 1/2 yrs ago  Substance and Sexual Activity  . Alcohol use: Yes    Comment: rarely  . Drug use: No  . Sexual activity: Yes

## 2018-09-26 ENCOUNTER — Ambulatory Visit (INDEPENDENT_AMBULATORY_CARE_PROVIDER_SITE_OTHER): Payer: Medicare Other | Admitting: Orthopedic Surgery

## 2018-12-05 ENCOUNTER — Ambulatory Visit (INDEPENDENT_AMBULATORY_CARE_PROVIDER_SITE_OTHER): Payer: Self-pay

## 2018-12-05 ENCOUNTER — Ambulatory Visit (INDEPENDENT_AMBULATORY_CARE_PROVIDER_SITE_OTHER): Payer: Medicare Other | Admitting: Orthopedic Surgery

## 2018-12-05 DIAGNOSIS — M1711 Unilateral primary osteoarthritis, right knee: Secondary | ICD-10-CM | POA: Diagnosis not present

## 2018-12-05 DIAGNOSIS — M79672 Pain in left foot: Secondary | ICD-10-CM | POA: Diagnosis not present

## 2018-12-05 DIAGNOSIS — M79671 Pain in right foot: Secondary | ICD-10-CM

## 2018-12-06 ENCOUNTER — Encounter (INDEPENDENT_AMBULATORY_CARE_PROVIDER_SITE_OTHER): Payer: Self-pay | Admitting: Orthopedic Surgery

## 2018-12-06 NOTE — Progress Notes (Signed)
Office Visit Note   Patient: [name]           Date of Birth: 04-03-1961           MRN: [id number] Visit Date: 12/05/2018 Requested by: Maylon Peppers, MD [address] [place], [place] [address] PCP: Maylon Peppers, MD  Subjective: Chief Complaint  Patient presents with  . Right Knee - Pain  . Right Ankle - Pain    HPI: [name] is a patient with 3 orthopedic complaints today.  She reports ongoing right knee pain.  The right knee pain is severe and is keeping her up at night.  She has failed cortisone injections as well as gel injections.  She had MRI scan last year which shows primarily patellofemoral arthritis as well as some medial compartment arthritis.  Difficult for her to go up and down stairs.  Her pain is affecting her on a daily basis.  She is diabetic and has hemoglobin A1c of 6.3.  She has a history of back fusions but has seen her spine surgeon and he assures her that this is not a radiating pain pattern coming to the knee from the back.  Patient also reports retro-Achilles type pain on the right-hand side.  Localizes the pain discretely to the Achilles tendon attachment site on the calcaneus.  Denies any plantar pain.  She is using Advil and Tylenol and topicals with mild relief.  Patient also reports left great toe pain.  She was standing up on her toes and felt an injury on the plantar aspect of that MTP joint #1.  That is gradually getting a little bit better.  She does have a lot of vacation plans on going this summer.  She had the right knee aspirated and injected 09/21/2018 is also using a brace without much relief.              ROS: All systems reviewed are negative as they relate to the chief complaint within the history of present illness.  Patient denies  fevers or chills.   Assessment & Plan: Visit Diagnoses:  1. Pain in left foot   2. Pain in right foot   3. Unilateral primary osteoarthritis, right knee     Plan: Impression is significant  right knee arthritis with failure of conservative measures.  Discussed with her at length today about nonoperative and operative treatment options.  At some point she is going to be getting a knee replacement.  She would like to do that sooner rather than later.  She has moderate medial compartment arthritis and fairly severe patellofemoral arthritis worse in the proximal aspect of the patella.  She has had prior lateral release and arthroscopy in the past.  The risk and benefits of knee replacement discussed including not limited to infection nerve vessel damage incomplete pain relief knee stiffness as well as the prolonged rehabilitative process required.  Patient understands and wishes to proceed.  All questions answered.  In regards to the right heel I think heel cord stretching and eccentric strengthening is indicated.  Not much in the way of significant osteophyte formation within the muscle tendon unit of the Achilles itself.  In regards to that left toe pain that she may have a small avulsion off the plantar aspect of the proximal phalanx.  This is improving.  And her extensor and flexor function of the toes intact.  I will see her 2 weeks after her knee surgery.  Follow-Up Instructions: No  follow-ups on file.   Orders:  Orders Placed This Encounter  Procedures  . XR Foot Complete Left  . XR Foot Complete Right   No orders of the defined types were placed in this encounter.     Procedures: No procedures performed   Clinical Data: No additional findings.  Objective: Vital Signs: There were no vitals taken for this visit.  Physical Exam:   Constitutional: Patient appears well-developed HEENT:  Head: Normocephalic Eyes:EOM are normal Neck: Normal range of motion Cardiovascular: Normal rate Pulmonary/chest: Effort normal Neurologic: Patient is alert Skin: Skin is warm Psychiatric: Patient has normal mood and affect    Ortho Exam: Ortho exam demonstrates slightly  increased body mass index but normal gait alignment.  She has no knee effusion on the right left-hand side today but fairly significant pain with passive range of motion of the right knee.  Collateral and cruciate ligaments are stable.  Extensor mechanism is intact.  No masses lymphadenopathy or skin changes noted in that right knee region.  There are no nerve root tension signs and no groin pain with internal X rotation of the leg.  Lacks about 5 degrees of full extension on the right but has flexion easily to about 1 10-1 15.  On examination of that right foot she has tenderness at the Achilles attachment site but no swelling or gaps in her foot plantarflexion Achilles tendons.  Pedal pulses are palpable and sensation is intact in the foot.  On the left-hand side she also has very good ankle dorsiflexion plantarflexion strength and range of motion consistent with her right sided good range of motion.  A little bit of tenderness at the plantar aspect of the MTP joint but her toe flexion extension is intact and there is no bruising or ecchymosis noted.  This is improving symptomatically.  Specialty Comments:  No specialty comments available.  Imaging: No results found.   PMFS History: Patient Active Problem List   Diagnosis Date Noted  . Chronic pain of right knee 06/06/2018  . Pain of right heel 06/06/2018  . 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 Chest pain 02/02/2012  . Leukocytosis 02/02/2012  . Barrett's esophagus 04/28/2011  . FATTY LIVER DISEASE 05/24/2010  . SMOKER 09/08/2009  . DEPRESSION 04/02/2008  . DIABETES MELLITUS 12/25/2007  . HYPERLIPIDEMIA 12/25/2007  . OBESITY 12/25/2007  . HYPERTENSION 12/25/2007  . INTERSTITIAL CYSTITIS 12/25/2007  . [place] DISEASE 12/25/2007   Past Medical History:  Diagnosis Date  . Anxiety   . Barrett esophagus    Dr. Olevia Perches in GI  . Chronic back pain    stenosis and DDD  . DDD (degenerative disc disease)    s/p back surgeries and  steroid injxns  . Depression    takes Fluoxetine daily  . Diabetes mellitus    Victoza daily  . GERD (gastroesophageal reflux disease)    takes Nexium daily  . History of kidney stones   . Hyperlipidemia    hasn't been on Pravastatin in 6-7wks   . Hypertension    takes Amlodipine and HCTZ daily  . Interstitial cystitis   . Joint pain   . Joint swelling   . Muscle spasm    takes Ativan nightly as needed  . Numbness    weakness in right leg  . Pneumonia    fall 2014  . PONV (postoperative nausea and vomiting)   . Spinal headache    blood patch required   . Urinary frequency     Family History  Problem Relation Age of Onset  . Hypertension Father   . Colon polyps Father   . Heart disease Mother        ischemic  . Gallbladder disease Mother   . Other Mother        rectovaginal fistula  . Allergies Sister   . Asthma Sister     Past Surgical History:  Procedure Laterality Date  . adhesions removed from abdomen    . APPENDECTOMY    . BACK SURGERY  12/2007   x 6-fusion x 2  . CESAREAN SECTION  1988/1989  . CHOLECYSTECTOMY    . COLONOSCOPY  2011  . epidural injections    . KNEE ARTHROSCOPY Bilateral   . LAPAROSCOPIC LYSIS OF ADHESIONS N/A 04/15/2014   Procedure: LAPAROSCOPIC LYSIS OF ADHESION;  Surgeon: Joyice Faster. Cornett, MD;  Location: [facility];  Service: General;  Laterality: N/A;  . LAPAROSCOPY N/A 04/15/2014   Procedure: LAPAROSCOPY DIAGNOSTIC;  Surgeon: Joyice Faster. Cornett, MD;  Location: [facility];  Service: General;  Laterality: N/A;  . LEFT HEART CATHETERIZATION WITH CORONARY ANGIOGRAM N/A 02/03/2012   Procedure: LEFT HEART CATHETERIZATION WITH CORONARY ANGIOGRAM;  Surgeon: Troy Sine, MD;  Location: [facility] CATH LAB;  Service: Cardiovascular;  Laterality: N/A;  . LUMBAR FUSION Right 03/24/2015   Procedure: SI joint fusion - right ;  Surgeon: Karie Chimera, MD;  Location: [place] NEURO ORS;  Service: Neurosurgery;  Laterality: Right;  SI joint fusion - right   . NECK SURGERY   12/2007  . OOPHORECTOMY    . TOTAL ABDOMINAL HYSTERECTOMY    . upper gi endoscopy     Social History   Occupational History  . Occupation: on disability since March 2009 d/t back pain  . 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 Smoking status: Former Smoker    Packs/day: 0.30    Years: 17.00    Pack years: 5.10    Types: Cigarettes  . Smokeless tobacco: Never Used  . Tobacco comment: quit smoking 2 1/2 yrs ago  Substance and Sexual Activity  . Alcohol use: Yes    Comment: rarely  . Drug use: No  . Sexual activity: Yes

## 2018-12-12 ENCOUNTER — Other Ambulatory Visit (INDEPENDENT_AMBULATORY_CARE_PROVIDER_SITE_OTHER): Payer: Self-pay | Admitting: Orthopedic Surgery

## 2018-12-12 DIAGNOSIS — M1711 Unilateral primary osteoarthritis, right knee: Secondary | ICD-10-CM

## 2018-12-13 NOTE — Pre-Procedure Instructions (Signed)
JOURDYN HASLER  12/13/2018      [facility] [address] [place] [address] [place] [place] [address] Phone: [phone] Fax: [phone]    Your procedure is scheduled on Tues., Feb. 25, 2020 from 10:37AM-1:33PM  Report to [facility] Entrance "A" at 8:35AM  Call this number if you have problems the morning of surgery:  [phone]   Remember:  Do not eat or drink after midnight on Feb. 24th    Take these medicines the morning of surgery with A SIP OF WATER:  AmLODipine (NORVASC), Esomeprazole (NEXIUM), and FLUoxetine (PROZAC)  If needed: HYDROcodone-acetaminophen (NORCO) and LORazepam (ATIVAN)   7 days before surgery (12/18/18), stop taking all Other Aspirin Products, Vitamins, Fish oils, and Herbal medications. Also stop all NSAIDS i.e. Advil, Ibuprofen, Motrin, Aleve, Anaprox, Naproxen, BC, Goody Powders, and all Supplements.    . Do not take JARDIANCE the night before, or the morning of surgery.  . Do not take Insulin Lispro (HUMALOG) the night before surgery.      . THE MORNING OF SURGERY, take ___25__________ units of __TRESIBA ________insulin.  . The day of surgery, do not take other diabetes injectable Semaglutide,0.25 or 0.5MG /DOS, ([place])   How to Manage Your Diabetes Before and After Surgery  Why is it important to control my blood sugar before and after surgery? . Improving blood sugar levels before and after surgery helps healing and can limit problems. . A way of improving blood sugar control is eating a healthy diet by: o  Eating less sugar and carbohydrates o  Increasing activity/exercise o  Talking with your doctor about reaching your blood sugar goals . High blood sugars (greater than 180 mg/dL) can raise your risk of infections and slow your recovery, so you will need to focus on controlling your diabetes during the weeks before surgery. . Make sure that the doctor who takes care of your  diabetes knows about your planned surgery including the date and location.  How do I manage my blood sugar before surgery? . Check your blood sugar at least 4 times a day, starting 2 days before surgery, to make sure that the level is not too high or low. o Check your blood sugar the morning of your surgery when you wake up and every 2 hours until you get to the Short Stay unit. . If your blood sugar is less than 70 mg/dL, you will need to treat for low blood sugar: o Do not take insulin. o Treat a low blood sugar (less than 70 mg/dL) with  cup of clear juice (cranberry or apple), 4 glucose tablets, OR glucose gel. Recheck blood sugar in 15 minutes after treatment (to make sure it is greater than 70 mg/dL). If your blood sugar is not greater than 70 mg/dL on recheck, call [phone] o  for further instructions. .  If your CBG is greater than 220 mg/dL, you may take  of your sliding scale (correction) dose of insulin.  . If you are admitted to the hospital after surgery: o Your blood sugar will be checked by the staff and you will probably be given insulin after surgery (instead of oral diabetes medicines) to make sure you have good blood sugar levels. o The goal for blood sugar control after surgery is 80-180 mg/dL.   Reviewed and Endorsed by [facility] Patient Education Committee, August 2015    Do not wear jewelry, make-up or nail polish.  Do not wear lotions,  powders, or perfumes, or deodorant.  Do not shave 48 hours prior to surgery.   Do not bring valuables to the hospital.  [facility] is not responsible for any belongings or valuables.  Contacts, dentures or bridgework may not be worn into surgery.  Leave your suitcase in the car.  After surgery it may be brought to your room.  For patients admitted to the hospital, discharge time will be determined by your treatment team.  Patients discharged the day of surgery will not be allowed to drive home.   Special  instructions: [place]- Preparing For Surgery  Before surgery, you can play an important role. Because skin is not sterile, your skin needs to be as free of germs as possible. You can reduce the number of germs on your skin by washing with CHG (chlorahexidine gluconate) Soap before surgery.  CHG is an antiseptic cleaner which kills germs and bonds with the skin to continue killing germs even after washing.    Oral Hygiene is also important to reduce your risk of infection.  Remember - BRUSH YOUR TEETH THE MORNING OF SURGERY WITH YOUR REGULAR TOOTHPASTE  Please do not use if you have an allergy to CHG or antibacterial soaps. If your skin becomes reddened/irritated stop using the CHG.  Do not shave (including legs and underarms) for at least 48 hours prior to first CHG shower. It is OK to shave your face.  Please follow these instructions carefully.   1. Shower the NIGHT BEFORE SURGERY and the MORNING OF SURGERY with CHG.   2. If you chose to wash your hair, wash your hair first as usual with your normal shampoo.  3. After you shampoo, rinse your hair and body thoroughly to remove the shampoo.  4. Use CHG as you would any other liquid soap. You can apply CHG directly to the skin and wash gently with a scrungie or a clean washcloth.   5. Apply the CHG Soap to your body ONLY FROM THE NECK DOWN.  Do not use on open wounds or open sores. Avoid contact with your eyes, ears, mouth and genitals (private parts). Wash Face and genitals (private parts)  with your normal soap.  6. Wash thoroughly, paying special attention to the area where your surgery will be performed.  7. Thoroughly rinse your body with warm water from the neck down.  8. DO NOT shower/wash with your normal soap after using and rinsing off the CHG Soap.  9. Pat yourself dry with a CLEAN TOWEL.  10. Wear CLEAN PAJAMAS to bed the night before surgery, wear comfortable clothes the morning of surgery  11. Place CLEAN SHEETS on  your bed the night of your first shower and DO NOT SLEEP WITH PETS.  Day of Surgery:  Do not apply any deodorants/lotions.  Please wear clean clothes to the hospital/surgery center.   Remember to brush your teeth WITH YOUR REGULAR TOOTHPASTE.  Please read over the following fact sheets that you were given. Pain Booklet, Coughing and Deep Breathing, MRSA Information and Surgical Site Infection Prevention

## 2018-12-14 ENCOUNTER — Encounter (HOSPITAL_COMMUNITY)
Admission: RE | Admit: 2018-12-14 | Discharge: 2018-12-14 | Disposition: A | Discharge status: Home / Self Care | Payer: Medicare Other | Source: Ambulatory Visit | Attending: Orthopedic Surgery | Admitting: Orthopedic Surgery

## 2018-12-14 ENCOUNTER — Encounter (HOSPITAL_COMMUNITY): Payer: Self-pay

## 2018-12-14 ENCOUNTER — Other Ambulatory Visit: Payer: Self-pay

## 2018-12-14 DIAGNOSIS — N3946 Mixed incontinence: Secondary | ICD-10-CM | POA: Insufficient documentation

## 2018-12-14 DIAGNOSIS — E119 Type 2 diabetes mellitus without complications: Secondary | ICD-10-CM | POA: Insufficient documentation

## 2018-12-14 DIAGNOSIS — Z01818 Encounter for other preprocedural examination: Secondary | ICD-10-CM | POA: Diagnosis present

## 2018-12-14 HISTORY — DX: Malignant (primary) neoplasm, unspecified: C80.1

## 2018-12-14 LAB — BASIC METABOLIC PANEL
ANION GAP: 14 (ref 5–15)
BUN: 12 mg/dL (ref 6–20)
CALCIUM: 9.6 mg/dL (ref 8.9–10.3)
CO2: 27 mmol/L (ref 22–32)
CREATININE: 0.78 mg/dL (ref 0.44–1.00)
Chloride: 100 mmol/L (ref 98–111)
Glucose, Bld: 124 mg/dL — ABNORMAL HIGH (ref 70–99)
Potassium: 3.6 mmol/L (ref 3.5–5.1)
Sodium: 141 mmol/L (ref 135–145)

## 2018-12-14 LAB — CBC
HCT: 45.5 % (ref 36.0–46.0)
Hemoglobin: 15.3 g/dL — ABNORMAL HIGH (ref 12.0–15.0)
MCH: 30.7 pg (ref 26.0–34.0)
MCHC: 33.6 g/dL (ref 30.0–36.0)
MCV: 91.4 fL (ref 80.0–100.0)
NRBC: 0 % (ref 0.0–0.2)
PLATELETS: 344 10*3/uL (ref 150–400)
RBC: 4.98 MIL/uL (ref 3.87–5.11)
RDW: 12.7 % (ref 11.5–15.5)
WBC: 8.5 10*3/uL (ref 4.0–10.5)

## 2018-12-14 LAB — GLUCOSE, CAPILLARY: Glucose-Capillary: 157 mg/dL — ABNORMAL HIGH (ref 70–99)

## 2018-12-14 LAB — SURGICAL PCR SCREEN
MRSA, PCR: NEGATIVE
STAPHYLOCOCCUS AUREUS: NEGATIVE

## 2018-12-14 NOTE — Progress Notes (Signed)
PCP - Ardith Dark Cardiologist - denies  Chest x-ray - N/A EKG - 12-14-18 Pt recently put on heart monitor for (2) weeks for palpitations, by PCP.  Findings in [place], under [facility].  Pt stated low Potassium was the issue, and was not referred to a cardiologist.  Notified Jeneen Rinks.  Per Jeneen Rinks need to have EKG tracing done today at Pre-Op Appointment.  Chart sent to Anesthesia for review.  Cardiac Cath - 2013  SA - denies  DM - Type 2 Fasting Blood Sugar - 120-200   Anesthesia review: yes  Patient denies shortness of breath, fever, cough and chest pain at PAT appointment   Patient verbalized understanding of instructions that were given to them at the PAT appointment. Patient was also instructed that they will need to review over the PAT instructions again at home before surgery.

## 2018-12-15 LAB — URINE CULTURE: Culture: <10000 — AB

## 2018-12-17 NOTE — Progress Notes (Addendum)
Anesthesia Chart Review:  Case:  [id number] Date/Time:  12/25/18 1022   Procedure:  RIGHT TOTAL KNEE ARTHROPLASTY (Right )   Anesthesia type:  General   Pre-op diagnosis:  right knee osteoarthritis   Location:  [facility] OR ROOM 06 / [facility]   Surgeon:  Meredith Pel, MD      DISCUSSION: Patient is a 58 year old female scheduled for the above procedure.  History includes former smoker, post-operative N/V, spinal headache (s/p blood patch; occurred prior to 03/2014), HLD, GERD, HTN, DM2, skin cancer, C5-6 ACDF 02/06/09, back surgery (L5-S1 fusion 07/02/04 with reexploration and removal of L5-S1 instrumentation and L3-4/L4-5 PLIF/L3-5 posterolateral arthrodesis 12/24/07), laparoscopy/LOA 04/15/14, right SI joint fusion 03/24/15. Normal coronaries in 2013. BMI is consistent with obesity.   - She had critical prolonged QT (QT/QTc 508/579 ms) with frequent PVCs on 04/08/14 EKG which was done at her PAT visit before 04/15/14 laparoscopy. EKG findings were in the setting of hypokalemia and treatment for sinusitis with azithromycin. Azithromycin was felt to be a contributing factor as it has a serious side effect include prolonged QT. (See my previous 2015 PAT note.) Her PCP supplemented hypokalemia and repeated EKG which showed improving prolonged QT, so she but did not feel patient needed referral to cardiology. EKG was again repeated on 04/15/14 prior to surgery and showed QT/QTc 438/482 ms. Her anesthesiologist chose to place defibrillator pads for that surgery, but does not appear that these were felt necessary for 2016 procedure. QT/QTc overall improved in follow-up tracing on 09/14/14 (QT/QTc 378/454 ms) and 12/14/18 (QT/QTc 446/452 ms).  - She was seen at her PCP office on 09/26/18 for palpitations. Thyroid labs were WNL. A two week event monitor was ordered and showed underlying SR with rare PVCs and PACs and one 7 beat run of atrial tachycardia. Symptoms of fluttering noted with brief run of AT, but sensation of  fluttering also reported when no arrhythmia noted. Per notes, patient will continue to monitor her symptoms and let her PCP know if become more bothersome and wants referral.   Patient denied SOB, cough, fever, and chest pain at PAT RN visit. EKG showed NSR, QT/QTc 446/452 ms and recent event monitor showed only 7 beat run of atrial tachycardia and only rare PVCs and PACs. Labs acceptable for OR. If no acute changes then it is anticipated that she can proceed as planned. Chart reviewed with anesthesiologist Oddono, EJ, MD.   VS: BP 134/78   Pulse 79   Temp 37 C   Resp 20   Ht 5\' 6"  (1.676 m)   Wt 93.9 kg   SpO2 99%   BMI 33.43 kg/m   PROVIDERS: Maylon Peppers, MD is PCP. She also often sees Hedgecock, Chiropodist, PA-C (see [place]).  - Endocrinologist is through [facility]. Last visit 11/21/18 with Peri Jefferson, PA-C.   LABS: Labs reviewed: Acceptable for surgery. PAT labs are marked as reviewed by Dr. Marlou Sa. A1c 6.3 on 11/21/18 ([place]). Reported fasting CBGs ~ 120-200. (all labs ordered are listed, but only abnormal results are displayed)  Labs Reviewed  URINE CULTURE - Abnormal; Notable for the following components:      Result Value   Culture   (*)    Value: <10,000 COLONIES/mL INSIGNIFICANT GROWTH Performed at [facility], [address], [place], [place] [address]    All other components within normal limits  GLUCOSE, CAPILLARY - Abnormal; Notable for the following components:   Glucose-Capillary 157 (*)  All other components within normal limits  BASIC METABOLIC PANEL - Abnormal; Notable for the following components:   Glucose, Bld 124 (*)    All other components within normal limits  CBC - Abnormal; Notable for the following components:   Hemoglobin 15.3 (*)    All other components within normal limits  SURGICAL PCR SCREEN    EKG: 12/14/18: Normal sinus rhythm Low voltage QRS QT/QTc 446/452 ms Borderline ECG Confirmed by Thompson Grayer ([id number]) on 12/15/2018 8:49:17 AM   CV: 2 week Event monitor 12/03/18 ([place]): Indication: palpitations Baseline Rhythm: sinus rhythm Rhythm Findings: 1. Ventricular: rare single PVCs 2. Supraventricular: rare single PACs and one atrial tachycardia run of 7 beats. 3. Bradyarrhythmias and Pauses: no, the average heart was 79 bpm. Symptoms reported: chest pains and heart flutter CONCLUSIONS: 1. slightly abnormal event monitor.  2. arrhythmia:present as described 3. Symptoms were reported  4. Symptoms were correlated with arrhythmias in rare instances with arrhythmia. The vast majority were not associated with any arrhythmias. The atrial run was felt as a flutter. However there were multiple other episodes of fluttering which were not associated with arrhythmia. Electronically signed by: Alphonzo Lemmings, MD 12/06/2018 10:01 AM, MD [facility] 12/06/18   Cardiac cath 02/03/12 (for evaluation of nitrate responsive chest pain in the setting of multiple CAD risk factors; done at [facility] by Shelva Majestic, MD): Normal coronaries. Normal LV function.  No renal artery stenosis.   Past Medical History:  Diagnosis Date  . Anxiety   . Barrett esophagus    Dr. Olevia Perches in GI  . Cancer ([place])    Skin Cancer  . Chronic back pain    stenosis and DDD  . DDD (degenerative disc disease)    s/p back surgeries and steroid injxns  . Depression    takes Fluoxetine daily  . Diabetes mellitus    Victoza daily  . GERD (gastroesophageal reflux disease)    takes Nexium daily  . History of kidney stones   . Hyperlipidemia    hasn't been on Pravastatin in 6-7wks   . Hypertension    takes Amlodipine and HCTZ daily  . Interstitial cystitis   . Joint pain   . Joint swelling   . Muscle spasm    takes Ativan nightly as needed  . Numbness    weakness in right leg  . Pneumonia    fall 2014  . PONV (postoperative nausea and vomiting)   . Spinal headache    blood patch required    . Urinary frequency     Past Surgical History:  Procedure Laterality Date  . adhesions removed from abdomen    . APPENDECTOMY    . BACK SURGERY  12/2007   x 6-fusion x 2  . CESAREAN SECTION  1988/1989  . CHOLECYSTECTOMY    . COLONOSCOPY  2011  . epidural injections    . KNEE ARTHROSCOPY Bilateral   . LAPAROSCOPIC LYSIS OF ADHESIONS N/A 04/15/2014   Procedure: LAPAROSCOPIC LYSIS OF ADHESION;  Surgeon: Joyice Faster. Cornett, MD;  Location: [place];  Service: General;  Laterality: N/A;  . LAPAROSCOPY N/A 04/15/2014   Procedure: LAPAROSCOPY DIAGNOSTIC;  Surgeon: Joyice Faster. Cornett, MD;  Location: [place];  Service: General;  Laterality: N/A;  . LEFT HEART CATHETERIZATION WITH CORONARY ANGIOGRAM N/A 02/03/2012   Procedure: LEFT HEART CATHETERIZATION WITH CORONARY ANGIOGRAM;  Surgeon: Troy Sine, MD;  Location: [facility] CATH LAB;  Service: Cardiovascular;  Laterality: N/A;  . LUMBAR FUSION Right 03/24/2015  Procedure: SI joint fusion - right ;  Surgeon: Karie Chimera, MD;  Location: [place] NEURO ORS;  Service: Neurosurgery;  Laterality: Right;  SI joint fusion - right   . NECK SURGERY  12/2007  . OOPHORECTOMY    . TOTAL ABDOMINAL HYSTERECTOMY    . upper gi endoscopy      MEDICATIONS: . ACCU-CHEK AVIVA PLUS test strip  . amLODipine (NORVASC) 5 MG tablet  . esomeprazole (NEXIUM) 40 MG capsule  . Evolocumab (REPATHA) 140 MG/ML SOSY  . FLUoxetine (PROZAC) 40 MG capsule  . hydrochlorothiazide (HYDRODIURIL) 25 MG tablet  . HYDROcodone-acetaminophen (NORCO) 10-325 MG per tablet  . insulin lispro (HUMALOG) 100 UNIT/ML injection  . JARDIANCE 25 MG TABS tablet  . LORazepam (ATIVAN) 1 MG tablet  . potassium chloride (K-DUR) 10 MEQ tablet  . Semaglutide,0.25 or 0.5MG /DOS, (OZEMPIC, 0.25 OR 0.5 MG/DOSE,) 2 MG/1.5ML SOPN  . TRESIBA FLEXTOUCH 100 UNIT/ML SOPN FlexTouch Pen   No current facility-administered medications for this encounter.     Myra Gianotti, PA-C Surgical Short Stay/Anesthesiology [facility] Phone  [phone] [facility] Phone [phone] 12/18/2018 12:02 PM

## 2018-12-18 NOTE — Anesthesia Preprocedure Evaluation (Addendum)
Anesthesia Evaluation  Patient identified by MRN, date of birth, ID band Patient awake    Reviewed: Allergy & Precautions, NPO status , Patient's Chart, lab work & pertinent test results  History of Anesthesia Complications (+) PONV and POST - OP SPINAL HEADACHE  Airway Mallampati: II  TM Distance: >3 FB Neck ROM: Full    Dental no notable dental hx.    Pulmonary former smoker,    Pulmonary exam normal breath sounds clear to auscultation       Cardiovascular hypertension, Pt. on medications Normal cardiovascular exam Rhythm:Regular Rate:Normal  ECG: NSR, rate 62   Neuro/Psych  Headaches, PSYCHIATRIC DISORDERS Anxiety Depression    GI/Hepatic Neg liver ROS, GERD  Medicated and Controlled,  Endo/Other  diabetes, Insulin Dependent  Renal/GU negative Renal ROS     Musculoskeletal  (+) Arthritis , Chronic back pain   Abdominal   Peds  Hematology HLD   Anesthesia Other Findings Right knee osteoarthritis  Reproductive/Obstetrics                           Anesthesia Physical Anesthesia Plan  ASA: II  Anesthesia Plan: Regional and General   Post-op Pain Management:  Regional for Post-op pain   Induction:   PONV Risk Score and Plan: 3 and Ondansetron, Dexamethasone, Midazolam, Treatment may vary due to age or medical condition and Scopolamine patch - Pre-op  Airway Management Planned: LMA  Additional Equipment:   Intra-op Plan:   Post-operative Plan: Extubation in OR  Informed Consent: I have reviewed the patients History and Physical, chart, labs and discussed the procedure including the risks, benefits and alternatives for the proposed anesthesia with the patient or authorized representative who has indicated his/her understanding and acceptance.     Dental advisory given  Plan Discussed with: CRNA  Anesthesia Plan Comments: (Reviewed PAT note written by Myra Gianotti, PA-C. )      Anesthesia Quick Evaluation

## 2018-12-25 ENCOUNTER — Inpatient Hospital Stay (HOSPITAL_COMMUNITY)
Admission: AD | Admit: 2018-12-25 | Discharge: 2018-12-27 | DRG: 470 | Disposition: A | Discharge status: Home Health | Payer: Medicare Other | Attending: Orthopedic Surgery | Admitting: Orthopedic Surgery

## 2018-12-25 ENCOUNTER — Encounter (HOSPITAL_COMMUNITY): Payer: Self-pay | Admitting: Orthopedic Surgery

## 2018-12-25 ENCOUNTER — Other Ambulatory Visit: Payer: Self-pay

## 2018-12-25 ENCOUNTER — Ambulatory Visit (HOSPITAL_COMMUNITY): Payer: Medicare Other | Admitting: Physician Assistant

## 2018-12-25 ENCOUNTER — Encounter (HOSPITAL_COMMUNITY)
Admission: AD | Disposition: A | Discharge status: Home Health | Payer: Self-pay | Source: Home / Self Care | Attending: Orthopedic Surgery

## 2018-12-25 ENCOUNTER — Ambulatory Visit (HOSPITAL_COMMUNITY): Payer: Medicare Other | Admitting: Anesthesiology

## 2018-12-25 DIAGNOSIS — F419 Anxiety disorder, unspecified: Secondary | ICD-10-CM | POA: Diagnosis present

## 2018-12-25 DIAGNOSIS — Z8249 Family history of ischemic heart disease and other diseases of the circulatory system: Secondary | ICD-10-CM

## 2018-12-25 DIAGNOSIS — M1711 Unilateral primary osteoarthritis, right knee: Principal | ICD-10-CM | POA: Diagnosis present

## 2018-12-25 DIAGNOSIS — I10 Essential (primary) hypertension: Secondary | ICD-10-CM | POA: Diagnosis present

## 2018-12-25 DIAGNOSIS — Z825 Family history of asthma and other chronic lower respiratory diseases: Secondary | ICD-10-CM | POA: Diagnosis not present

## 2018-12-25 DIAGNOSIS — Z8371 Family history of colonic polyps: Secondary | ICD-10-CM

## 2018-12-25 DIAGNOSIS — K2270 Barrett's esophagus without dysplasia: Secondary | ICD-10-CM | POA: Diagnosis present

## 2018-12-25 DIAGNOSIS — Z79899 Other long term (current) drug therapy: Secondary | ICD-10-CM | POA: Diagnosis not present

## 2018-12-25 DIAGNOSIS — M1710 Unilateral primary osteoarthritis, unspecified knee: Secondary | ICD-10-CM | POA: Diagnosis present

## 2018-12-25 DIAGNOSIS — F329 Major depressive disorder, single episode, unspecified: Secondary | ICD-10-CM | POA: Diagnosis present

## 2018-12-25 DIAGNOSIS — K219 Gastro-esophageal reflux disease without esophagitis: Secondary | ICD-10-CM | POA: Diagnosis present

## 2018-12-25 DIAGNOSIS — E785 Hyperlipidemia, unspecified: Secondary | ICD-10-CM | POA: Diagnosis present

## 2018-12-25 DIAGNOSIS — Z85828 Personal history of other malignant neoplasm of skin: Secondary | ICD-10-CM | POA: Diagnosis not present

## 2018-12-25 DIAGNOSIS — E119 Type 2 diabetes mellitus without complications: Secondary | ICD-10-CM | POA: Diagnosis present

## 2018-12-25 DIAGNOSIS — M62838 Other muscle spasm: Secondary | ICD-10-CM | POA: Diagnosis present

## 2018-12-25 DIAGNOSIS — Z79891 Long term (current) use of opiate analgesic: Secondary | ICD-10-CM

## 2018-12-25 DIAGNOSIS — G8929 Other chronic pain: Secondary | ICD-10-CM | POA: Diagnosis present

## 2018-12-25 DIAGNOSIS — Z87891 Personal history of nicotine dependence: Secondary | ICD-10-CM

## 2018-12-25 DIAGNOSIS — Z87442 Personal history of urinary calculi: Secondary | ICD-10-CM

## 2018-12-25 DIAGNOSIS — Z794 Long term (current) use of insulin: Secondary | ICD-10-CM

## 2018-12-25 DIAGNOSIS — M549 Dorsalgia, unspecified: Secondary | ICD-10-CM | POA: Diagnosis present

## 2018-12-25 HISTORY — PX: TOTAL KNEE ARTHROPLASTY: SHX125

## 2018-12-25 LAB — GLUCOSE, CAPILLARY
Glucose-Capillary: 102 mg/dL — ABNORMAL HIGH (ref 70–99)
Glucose-Capillary: 124 mg/dL — ABNORMAL HIGH (ref 70–99)
Glucose-Capillary: 146 mg/dL — ABNORMAL HIGH (ref 70–99)
Glucose-Capillary: 200 mg/dL — ABNORMAL HIGH (ref 70–99)

## 2018-12-25 SURGERY — ARTHROPLASTY, KNEE, TOTAL
Anesthesia: Regional | Site: Knee | Laterality: Right

## 2018-12-25 MED ORDER — ONDANSETRON HCL 4 MG PO TABS: 4.0000 mg | ORAL_TABLET | Freq: Four times a day (QID) | ORAL | Status: DC | PRN

## 2018-12-25 MED ORDER — ONDANSETRON HCL 4 MG/2ML IJ SOLN
4.0000 mg | Freq: Four times a day (QID) | INTRAMUSCULAR | Status: DC | PRN
  Administered 2018-12-25 – 2018-12-27 (×4): 4 mg via INTRAVENOUS
  Filled 2018-12-25 (×4): qty 2

## 2018-12-25 MED ORDER — FENTANYL CITRATE (PF) 250 MCG/5ML IJ SOLN
INTRAMUSCULAR | Status: AC
  Filled 2018-12-25: qty 5

## 2018-12-25 MED ORDER — FENTANYL CITRATE (PF) 100 MCG/2ML IJ SOLN
INTRAMUSCULAR | Status: AC
  Filled 2018-12-25: qty 2

## 2018-12-25 MED ORDER — CHLORHEXIDINE GLUCONATE 4 % EX LIQD: 60.0000 mL | Freq: Once | CUTANEOUS | Status: DC

## 2018-12-25 MED ORDER — ROPIVACAINE HCL 5 MG/ML IJ SOLN
INTRAMUSCULAR | Status: DC | PRN
  Administered 2018-12-25: 30 mL via PERINEURAL

## 2018-12-25 MED ORDER — OXYCODONE HCL 5 MG/5ML PO SOLN: 5.0000 mg | Freq: Once | ORAL | Status: DC | PRN

## 2018-12-25 MED ORDER — CLONIDINE HCL (ANALGESIA) 100 MCG/ML EP SOLN
150.0000 ug | Freq: Once | EPIDURAL | Status: DC
  Filled 2018-12-25: qty 10

## 2018-12-25 MED ORDER — CLONIDINE HCL (ANALGESIA) 100 MCG/ML EP SOLN
EPIDURAL | Status: DC | PRN
  Administered 2018-12-25: 1 mL

## 2018-12-25 MED ORDER — FAMOTIDINE 20 MG PO TABS
ORAL_TABLET | ORAL | Status: AC
  Filled 2018-12-25: qty 1

## 2018-12-25 MED ORDER — MIDAZOLAM HCL 2 MG/2ML IJ SOLN
INTRAMUSCULAR | Status: AC
  Filled 2018-12-25: qty 2

## 2018-12-25 MED ORDER — METOCLOPRAMIDE HCL 5 MG/ML IJ SOLN: 5.0000 mg | Freq: Three times a day (TID) | INTRAMUSCULAR | Status: DC | PRN

## 2018-12-25 MED ORDER — ASPIRIN 81 MG PO CHEW
81.0000 mg | CHEWABLE_TABLET | Freq: Two times a day (BID) | ORAL | Status: DC
  Administered 2018-12-25 – 2018-12-27 (×4): 81 mg via ORAL
  Filled 2018-12-25 (×4): qty 1

## 2018-12-25 MED ORDER — FENTANYL CITRATE (PF) 250 MCG/5ML IJ SOLN
INTRAMUSCULAR | Status: DC | PRN
  Administered 2018-12-25 (×3): 25 ug via INTRAVENOUS
  Administered 2018-12-25: 50 ug via INTRAVENOUS
  Administered 2018-12-25: 25 ug via INTRAVENOUS

## 2018-12-25 MED ORDER — FAMOTIDINE 20 MG PO TABS
20.0000 mg | ORAL_TABLET | Freq: Once | ORAL | Status: AC
  Administered 2018-12-25: 20 mg via ORAL

## 2018-12-25 MED ORDER — HYDROMORPHONE HCL 1 MG/ML IJ SOLN
0.2500 mg | INTRAMUSCULAR | Status: DC | PRN
  Administered 2018-12-25 (×2): 0.5 mg via INTRAVENOUS

## 2018-12-25 MED ORDER — LACTATED RINGERS IV SOLN
INTRAVENOUS | Status: DC
  Administered 2018-12-25 (×2): via INTRAVENOUS

## 2018-12-25 MED ORDER — BUPIVACAINE HCL 0.25 % IJ SOLN
INTRAMUSCULAR | Status: DC | PRN
  Administered 2018-12-25: 30 mL

## 2018-12-25 MED ORDER — METHOCARBAMOL 500 MG PO TABS
500.0000 mg | ORAL_TABLET | Freq: Four times a day (QID) | ORAL | Status: DC | PRN
  Administered 2018-12-25 – 2018-12-26 (×4): 500 mg via ORAL
  Filled 2018-12-25 (×4): qty 1

## 2018-12-25 MED ORDER — EPHEDRINE SULFATE-NACL 50-0.9 MG/10ML-% IV SOSY
PREFILLED_SYRINGE | INTRAVENOUS | Status: DC | PRN
  Administered 2018-12-25: 10 mg via INTRAVENOUS
  Administered 2018-12-25: 15 mg via INTRAVENOUS
  Administered 2018-12-25: 10 mg via INTRAVENOUS

## 2018-12-25 MED ORDER — BUPIVACAINE LIPOSOME 1.3 % IJ SUSP
20.0000 mL | INTRAMUSCULAR | Status: AC
  Administered 2018-12-25: 20 mL
  Filled 2018-12-25: qty 20

## 2018-12-25 MED ORDER — MORPHINE SULFATE (PF) 4 MG/ML IV SOLN
INTRAVENOUS | Status: DC | PRN
  Administered 2018-12-25: 8 mg

## 2018-12-25 MED ORDER — PROMETHAZINE HCL 25 MG/ML IJ SOLN: 6.2500 mg | INTRAMUSCULAR | Status: DC | PRN

## 2018-12-25 MED ORDER — HYDROMORPHONE HCL 1 MG/ML IJ SOLN
INTRAMUSCULAR | Status: AC
  Filled 2018-12-25: qty 1

## 2018-12-25 MED ORDER — TRANEXAMIC ACID-NACL 1000-0.7 MG/100ML-% IV SOLN
INTRAVENOUS | Status: DC | PRN
  Administered 2018-12-25: 1000 mg via INTRAVENOUS

## 2018-12-25 MED ORDER — SODIUM CHLORIDE 0.9% FLUSH
INTRAVENOUS | Status: DC | PRN
  Administered 2018-12-25: 20 mL via INTRAVENOUS

## 2018-12-25 MED ORDER — TRAMADOL HCL 50 MG PO TABS
50.0000 mg | ORAL_TABLET | Freq: Four times a day (QID) | ORAL | Status: DC
  Administered 2018-12-25 – 2018-12-27 (×6): 50 mg via ORAL
  Filled 2018-12-25 (×6): qty 1

## 2018-12-25 MED ORDER — SODIUM CHLORIDE 0.9 % IR SOLN
Status: DC | PRN
  Administered 2018-12-25: 3000 mL

## 2018-12-25 MED ORDER — MENTHOL 3 MG MT LOZG: 1.0000 | LOZENGE | OROMUCOSAL | Status: DC | PRN

## 2018-12-25 MED ORDER — VANCOMYCIN HCL IN DEXTROSE 1-5 GM/200ML-% IV SOLN
1000.0000 mg | Freq: Two times a day (BID) | INTRAVENOUS | Status: AC
  Administered 2018-12-25: 1000 mg via INTRAVENOUS
  Filled 2018-12-25 (×2): qty 200

## 2018-12-25 MED ORDER — METOCLOPRAMIDE HCL 5 MG PO TABS: 5.0000 mg | ORAL_TABLET | Freq: Three times a day (TID) | ORAL | Status: DC | PRN

## 2018-12-25 MED ORDER — ONDANSETRON HCL 4 MG/2ML IJ SOLN
INTRAMUSCULAR | Status: DC | PRN
  Administered 2018-12-25: 4 mg via INTRAVENOUS

## 2018-12-25 MED ORDER — BUPIVACAINE HCL (PF) 0.25 % IJ SOLN
INTRAMUSCULAR | Status: AC
  Filled 2018-12-25: qty 10

## 2018-12-25 MED ORDER — CLONIDINE HCL (ANALGESIA) 100 MCG/ML EP SOLN
EPIDURAL | Status: DC | PRN
  Administered 2018-12-25: 100 ug

## 2018-12-25 MED ORDER — 0.9 % SODIUM CHLORIDE (POUR BTL) OPTIME
TOPICAL | Status: DC | PRN
  Administered 2018-12-25 (×3): 1000 mL

## 2018-12-25 MED ORDER — LIDOCAINE 2% (20 MG/ML) 5 ML SYRINGE
INTRAMUSCULAR | Status: DC | PRN
  Administered 2018-12-25: 20 mg via INTRAVENOUS

## 2018-12-25 MED ORDER — ACETAMINOPHEN 500 MG PO TABS
1000.0000 mg | ORAL_TABLET | Freq: Once | ORAL | Status: AC
  Administered 2018-12-25: 1000 mg via ORAL
  Filled 2018-12-25: qty 2

## 2018-12-25 MED ORDER — PHENOL 1.4 % MT LIQD: 1.0000 | OROMUCOSAL | Status: DC | PRN

## 2018-12-25 MED ORDER — PROPOFOL 10 MG/ML IV BOLUS
INTRAVENOUS | Status: AC
  Filled 2018-12-25: qty 20

## 2018-12-25 MED ORDER — TRANEXAMIC ACID 1000 MG/10ML IV SOLN
2000.0000 mg | INTRAVENOUS | Status: AC
  Administered 2018-12-25: 2000 mg via TOPICAL
  Filled 2018-12-25: qty 20

## 2018-12-25 MED ORDER — BUPIVACAINE HCL (PF) 0.25 % IJ SOLN
INTRAMUSCULAR | Status: AC
  Filled 2018-12-25: qty 30

## 2018-12-25 MED ORDER — MIDAZOLAM HCL 2 MG/2ML IJ SOLN
INTRAMUSCULAR | Status: AC
  Administered 2018-12-25: 2 mg via INTRAVENOUS
  Filled 2018-12-25: qty 2

## 2018-12-25 MED ORDER — MIDAZOLAM HCL 2 MG/2ML IJ SOLN
2.0000 mg | Freq: Once | INTRAMUSCULAR | Status: AC
  Administered 2018-12-25: 2 mg via INTRAVENOUS

## 2018-12-25 MED ORDER — HYDROMORPHONE HCL 1 MG/ML IJ SOLN
0.5000 mg | INTRAMUSCULAR | Status: DC | PRN
  Administered 2018-12-26 – 2018-12-27 (×5): 0.5 mg via INTRAVENOUS
  Filled 2018-12-25 (×6): qty 1

## 2018-12-25 MED ORDER — SCOPOLAMINE 1 MG/3DAYS TD PT72
1.0000 | MEDICATED_PATCH | TRANSDERMAL | Status: DC
  Administered 2018-12-25: 1.5 mg via TRANSDERMAL

## 2018-12-25 MED ORDER — LACTATED RINGERS IV SOLN
INTRAVENOUS | Status: AC
  Administered 2018-12-26: 01:00:00 via INTRAVENOUS

## 2018-12-25 MED ORDER — ACETAMINOPHEN 325 MG PO TABS: 325.0000 mg | ORAL_TABLET | Freq: Four times a day (QID) | ORAL | Status: DC | PRN

## 2018-12-25 MED ORDER — OXYCODONE HCL 5 MG PO TABS: 5.0000 mg | ORAL_TABLET | Freq: Once | ORAL | Status: DC | PRN

## 2018-12-25 MED ORDER — TRANEXAMIC ACID-NACL 1000-0.7 MG/100ML-% IV SOLN
INTRAVENOUS | Status: AC
  Filled 2018-12-25: qty 100

## 2018-12-25 MED ORDER — PROPOFOL 10 MG/ML IV BOLUS
INTRAVENOUS | Status: DC | PRN
  Administered 2018-12-25: 200 mg via INTRAVENOUS

## 2018-12-25 MED ORDER — OXYCODONE HCL 5 MG PO TABS
5.0000 mg | ORAL_TABLET | ORAL | Status: DC | PRN
  Administered 2018-12-25: 5 mg via ORAL
  Administered 2018-12-26 – 2018-12-27 (×6): 10 mg via ORAL
  Filled 2018-12-25 (×4): qty 2
  Filled 2018-12-25: qty 1
  Filled 2018-12-25 (×2): qty 2

## 2018-12-25 MED ORDER — KETOROLAC TROMETHAMINE 30 MG/ML IJ SOLN: 30.0000 mg | Freq: Once | INTRAMUSCULAR | Status: DC | PRN

## 2018-12-25 MED ORDER — MORPHINE SULFATE (PF) 4 MG/ML IV SOLN
INTRAVENOUS | Status: AC
  Filled 2018-12-25: qty 2

## 2018-12-25 MED ORDER — SCOPOLAMINE 1 MG/3DAYS TD PT72
MEDICATED_PATCH | TRANSDERMAL | Status: AC
  Filled 2018-12-25: qty 1

## 2018-12-25 MED ORDER — METHOCARBAMOL 1000 MG/10ML IJ SOLN
500.0000 mg | Freq: Four times a day (QID) | INTRAVENOUS | Status: DC | PRN
  Filled 2018-12-25: qty 5

## 2018-12-25 MED ORDER — DEXAMETHASONE SODIUM PHOSPHATE 10 MG/ML IJ SOLN
INTRAMUSCULAR | Status: DC | PRN
  Administered 2018-12-25: 4 mg via INTRAVENOUS

## 2018-12-25 MED ORDER — VANCOMYCIN HCL IN DEXTROSE 1-5 GM/200ML-% IV SOLN
1000.0000 mg | INTRAVENOUS | Status: AC
  Administered 2018-12-25: 1000 mg via INTRAVENOUS
  Filled 2018-12-25: qty 200

## 2018-12-25 MED ORDER — DOCUSATE SODIUM 100 MG PO CAPS
100.0000 mg | ORAL_CAPSULE | Freq: Two times a day (BID) | ORAL | Status: DC
  Administered 2018-12-26 – 2018-12-27 (×3): 100 mg via ORAL
  Filled 2018-12-25 (×4): qty 1

## 2018-12-25 SURGICAL SUPPLY — 85 items
BAG DECANTER FOR FLEXI CONT (MISCELLANEOUS) ×3 IMPLANT
BANDAGE ESMARK 6X9 LF (GAUZE/BANDAGES/DRESSINGS) ×1 IMPLANT
BLADE SAG 18X100X1.27 (BLADE) ×3 IMPLANT
BLADE SAW SGTL 13.0X1.19X90.0M (BLADE) IMPLANT
BNDG CMPR 9X6 STRL LF SNTH (GAUZE/BANDAGES/DRESSINGS) ×1
BNDG CMPR MED 15X6 ELC VLCR LF (GAUZE/BANDAGES/DRESSINGS) ×1
BNDG COHESIVE 6X5 TAN STRL LF (GAUZE/BANDAGES/DRESSINGS) ×3 IMPLANT
BNDG ELASTIC 6X15 VLCR STRL LF (GAUZE/BANDAGES/DRESSINGS) ×3 IMPLANT
BNDG ESMARK 6X9 LF (GAUZE/BANDAGES/DRESSINGS) ×3
BOWL SMART MIX CTS (DISPOSABLE) IMPLANT
BSPLAT TIB 5 KN TRITANIUM (Knees) ×1 IMPLANT
CLOSURE WOUND 1/2 X4 (GAUZE/BANDAGES/DRESSINGS) ×2
COMPONENT TRI CR RETAIN SZ6 RT (Miscellaneous) IMPLANT
CONT SPECI 4OZ STER CLIK (MISCELLANEOUS) ×3 IMPLANT
COVER SURGICAL LIGHT HANDLE (MISCELLANEOUS) ×3 IMPLANT
COVER WAND RF STERILE (DRAPES) ×1 IMPLANT
CUFF TOURNIQUET SINGLE 34IN LL (TOURNIQUET CUFF) ×3 IMPLANT
CUFF TOURNIQUET SINGLE 44IN (TOURNIQUET CUFF) IMPLANT
DECANTER SPIKE VIAL GLASS SM (MISCELLANEOUS) ×3 IMPLANT
DRAPE INCISE IOBAN 66X45 STRL (DRAPES) ×2 IMPLANT
DRAPE ORTHO SPLIT 77X108 STRL (DRAPES) ×9
DRAPE SURG ORHT 6 SPLT 77X108 (DRAPES) ×3 IMPLANT
DRAPE U-SHAPE 47X51 STRL (DRAPES) ×3 IMPLANT
DRSG AQUACEL AG ADV 3.5X10 (GAUZE/BANDAGES/DRESSINGS) ×2 IMPLANT
DRSG AQUACEL AG ADV 3.5X14 (GAUZE/BANDAGES/DRESSINGS) IMPLANT
DURAPREP 26ML APPLICATOR (WOUND CARE) ×6 IMPLANT
ELECT CAUTERY BLADE 6.4 (BLADE) ×3 IMPLANT
ELECT REM PT RETURN 9FT ADLT (ELECTROSURGICAL) ×3
ELECTRODE REM PT RTRN 9FT ADLT (ELECTROSURGICAL) ×1 IMPLANT
GAUZE SPONGE 4X4 12PLY STRL (GAUZE/BANDAGES/DRESSINGS) ×3 IMPLANT
GLOVE BIOGEL PI IND STRL 6.5 (GLOVE) IMPLANT
GLOVE BIOGEL PI IND STRL 7.5 (GLOVE) ×1 IMPLANT
GLOVE BIOGEL PI IND STRL 8 (GLOVE) ×1 IMPLANT
GLOVE BIOGEL PI INDICATOR 6.5 (GLOVE) ×6
GLOVE BIOGEL PI INDICATOR 7.5 (GLOVE) ×2
GLOVE BIOGEL PI INDICATOR 8 (GLOVE) ×2
GLOVE SURG SS PI 7.0 STRL IVOR (GLOVE) ×8 IMPLANT
GLOVE SURG SS PI 8.0 STRL IVOR (GLOVE) ×2 IMPLANT
GOWN STRL REUS W/ TWL LRG LVL3 (GOWN DISPOSABLE) ×3 IMPLANT
GOWN STRL REUS W/TWL LRG LVL3 (GOWN DISPOSABLE) ×9
HANDPIECE INTERPULSE COAX TIP (DISPOSABLE) ×3
HOOD PEEL AWAY FLYTE STAYCOOL (MISCELLANEOUS) ×13 IMPLANT
IMMOBILIZER KNEE 20 (SOFTGOODS) IMPLANT
IMMOBILIZER KNEE 22 (SOFTGOODS) ×2 IMPLANT
IMMOBILIZER KNEE 22 UNIV (SOFTGOODS) ×2 IMPLANT
IMMOBILIZER KNEE 24 THIGH 36 (MISCELLANEOUS) IMPLANT
IMMOBILIZER KNEE 24 UNIV (MISCELLANEOUS)
INSERT TIB 5 9XCNDRL STAB BRNG (Insert) IMPLANT
KIT BASIN OR (CUSTOM PROCEDURE TRAY) ×3 IMPLANT
KIT TURNOVER KIT B (KITS) ×3 IMPLANT
KNEE INSERT TIB BRG (Insert) ×3 IMPLANT
KNEE PATELLA ASYMMETRIC 10X32 (Knees) ×2 IMPLANT
KNEE TIBIAL COMPONENT SZ5 (Knees) ×2 IMPLANT
MANIFOLD NEPTUNE II (INSTRUMENTS) ×3 IMPLANT
NDL 1/2 CIR CATGUT .05X1.09 (NEEDLE) IMPLANT
NDL SAFETY ECLIPSE 18X1.5 (NEEDLE) ×1 IMPLANT
NEEDLE 1/2 CIR CATGUT .05X1.09 (NEEDLE) ×3 IMPLANT
NEEDLE 22X1 1/2 (OR ONLY) (NEEDLE) ×6 IMPLANT
NEEDLE HYPO 18GX1.5 SHARP (NEEDLE) ×3
NS IRRIG 1000ML POUR BTL (IV SOLUTION) ×6 IMPLANT
PACK TOTAL JOINT (CUSTOM PROCEDURE TRAY) ×3 IMPLANT
PAD ARMBOARD 7.5X6 YLW CONV (MISCELLANEOUS) ×6 IMPLANT
PAD CAST 4YDX4 CTTN HI CHSV (CAST SUPPLIES) ×1 IMPLANT
PADDING CAST COTTON 4X4 STRL (CAST SUPPLIES) ×3
PADDING CAST COTTON 6X4 STRL (CAST SUPPLIES) ×3 IMPLANT
SET HNDPC FAN SPRY TIP SCT (DISPOSABLE) ×1 IMPLANT
STRIP CLOSURE SKIN 1/2X4 (GAUZE/BANDAGES/DRESSINGS) ×4 IMPLANT
SUCTION FRAZIER HANDLE 10FR (MISCELLANEOUS) ×2
SUCTION TUBE FRAZIER 10FR DISP (MISCELLANEOUS) ×1 IMPLANT
SUT MNCRL AB 3-0 PS2 18 (SUTURE) ×3 IMPLANT
SUT MON AB 4-0 PS1 27 (SUTURE) ×2 IMPLANT
SUT VIC AB 0 CT1 27 (SUTURE) ×12
SUT VIC AB 0 CT1 27XBRD ANBCTR (SUTURE) ×3 IMPLANT
SUT VIC AB 1 CT1 27 (SUTURE) ×12
SUT VIC AB 1 CT1 27XBRD ANBCTR (SUTURE) ×5 IMPLANT
SUT VIC AB 2-0 CT1 27 (SUTURE) ×9
SUT VIC AB 2-0 CT1 TAPERPNT 27 (SUTURE) ×4 IMPLANT
SUT VICRYL 0 UR6 27IN ABS (SUTURE) ×4 IMPLANT
SYR 30ML LL (SYRINGE) ×9 IMPLANT
SYR TB 1ML LUER SLIP (SYRINGE) ×3 IMPLANT
TOWEL OR 17X24 6PK STRL BLUE (TOWEL DISPOSABLE) ×6 IMPLANT
TOWEL OR 17X26 10 PK STRL BLUE (TOWEL DISPOSABLE) ×6 IMPLANT
TRAY CATH 16FR W/PLASTIC CATH (SET/KITS/TRAYS/PACK) IMPLANT
TRIATHLON CRUCIATE RETAIN SZ6 (Miscellaneous) ×3 IMPLANT
WATER STERILE IRR 1000ML POUR (IV SOLUTION) IMPLANT

## 2018-12-25 NOTE — Plan of Care (Signed)

## 2018-12-25 NOTE — Anesthesia Postprocedure Evaluation (Signed)
Anesthesia Post Note  Patient: TERRENCE PIZANA  Procedure(s) Performed: RIGHT TOTAL KNEE ARTHROPLASTY (Right Knee)     Patient location during evaluation: PACU Anesthesia Type: Regional and General Level of consciousness: awake and alert Pain management: pain level controlled Vital Signs Assessment: post-procedure vital signs reviewed and stable Respiratory status: spontaneous breathing, nonlabored ventilation, respiratory function stable and patient connected to nasal cannula oxygen Cardiovascular status: blood pressure returned to baseline and stable Postop Assessment: no apparent nausea or vomiting Anesthetic complications: no    Last Vitals:  Vitals:   12/25/18 1511 12/25/18 1518  BP: 124/73 125/72  Pulse: 76 81  Resp: 14 15  Temp:  36.7 C  SpO2: 93% 95%    Last Pain:  Vitals:   12/25/18 1518  TempSrc:   PainSc: Asleep                 Ryan P Ellender

## 2018-12-25 NOTE — Progress Notes (Signed)
Physical Therapy Evaluation Patient Details Name: [name] MRN: [id number] DOB: [birth year] Today's Date: 12/25/2018   History of Present Illness  Patient is 58 y/o female s/p R TKA. PMH includes HTN, HLD, anxiety, lumbar fusion, DDD, DM, and skin cancer.   Clinical Impression  Patient admitted to hospital secondary to problems above and with deficits below. Patient required min guard to stand and ambulate short distance to bathroom with use of RW. Patient feeling nauseous during functional mobility. Requesting return to bed therefore further mobility deferred. Reviewed supine HEP and knee precautions with patient. Patient will benefit from acute physical therapy to maximize independence and safety with functional mobility.     Follow Up Recommendations Follow surgeon's recommendation for DC plan and follow-up therapies    Equipment Recommendations  None recommended by PT    Recommendations for Other Services       Precautions / Restrictions Precautions Precautions: Fall;Knee Precaution Booklet Issued: Yes (comment) Precaution Comments: reviewed knee precautions with patient Required Braces or Orthoses: Knee Immobilizer - Right Knee Immobilizer - Right: Other (comment)(until discontinued) Restrictions Weight Bearing Restrictions: Yes RLE Weight Bearing: Weight bearing as tolerated      Mobility  Bed Mobility Overal bed mobility: Needs Assistance Bed Mobility: Supine to Sit;Sit to Supine     Supine to sit: Supervision Sit to supine: Supervision   General bed mobility comments: Patient required supervision for all bed mobility for safety  Transfers Overall transfer level: Needs assistance Equipment used: Rolling walker (2 wheeled) Transfers: Sit to/from Stand Sit to Stand: Min guard         General transfer comment: Patient required min guard to stand with RW x2. Verbal cues for hand placement prior to standing when using RW.    Ambulation/Gait Ambulation/Gait assistance: Min guard Gait Distance (Feet): 15 Feet Assistive device: Rolling walker (2 wheeled) Gait Pattern/deviations: Step-through pattern;Decreased step length - left;Decreased stance time - right;Decreased weight shift to right;Antalgic;Decreased stride length Gait velocity: decreased Gait velocity interpretation: <1.8 ft/sec, indicate of risk for recurrent falls General Gait Details: Patient ambulated short distance to bathroom with min guard and use of RW for safety. Patient feeling nauseous during functional mobility. Requesting return to bed and further mobility deferred.   Stairs            Wheelchair Mobility    Modified Rankin (Stroke Patients Only)       Balance Overall balance assessment: Needs assistance Sitting-balance support: No upper extremity supported;Feet supported Sitting balance-Leahy Scale: Good     Standing balance support: Bilateral upper extremity supported Standing balance-Leahy Scale: Poor Standing balance comment: reliant on BUE support to maintain standing balance                             Pertinent Vitals/Pain Pain Assessment: 0-10 Pain Score: 8  Pain Location: R knee Pain Descriptors / Indicators: Aching;Operative site guarding;Discomfort Pain Intervention(s): Limited activity within patient's tolerance;Monitored during session;Premedicated before session    Home Living Family/patient expects to be discharged to:: Private residence Living Arrangements: Spouse/significant other Available Help at Discharge: Family;Available 24 hours/day Type of Home: House Home Access: Stairs to enter Entrance Stairs-Rails: None Entrance Stairs-Number of Steps: 1 threshold Home Layout: One level Home Equipment: Walker - 2 wheels;Bedside commode;Shower seat - built in      Prior Function Level of Independence: Independent               Journalist, newspaper  Extremity/Trunk Assessment    Upper Extremity Assessment Upper Extremity Assessment: Overall WFL for tasks assessed    Lower Extremity Assessment Lower Extremity Assessment: RLE deficits/detail RLE Deficits / Details: RLE deficits consistent with post op pain and weakness    Cervical / Trunk Assessment Cervical / Trunk Assessment: Normal  Communication   Communication: No difficulties  Cognition Arousal/Alertness: Awake/alert Behavior During Therapy: WFL for tasks assessed/performed Overall Cognitive Status: Within Functional Limits for tasks assessed                                        General Comments General comments (skin integrity, edema, etc.): Patient boyfriend in room during session.     Exercises Total Joint Exercises Ankle Circles/Pumps: AROM;Both;20 reps;Supine Quad Sets: AROM;Right;10 reps;Supine Heel Slides: AROM;Right;10 reps;Supine   Assessment/Plan    PT Assessment Patient needs continued PT services  PT Problem List Decreased strength;Decreased range of motion;Decreased activity tolerance;Decreased balance;Decreased mobility;Decreased knowledge of use of DME;Decreased knowledge of precautions;Pain       PT Treatment Interventions DME instruction;Gait training;Stair training;Functional mobility training;Therapeutic activities;Therapeutic exercise;Balance training;Patient/family education    PT Goals (Current goals can be found in the Care Plan section)  Acute Rehab PT Goals Patient Stated Goal: go home PT Goal Formulation: With patient Time For Goal Achievement: 01/08/19 Potential to Achieve Goals: Good    Frequency 7X/week   Barriers to discharge        Co-evaluation               AM-PAC PT "6 Clicks" Mobility  Outcome Measure Help needed turning from your back to your side while in a flat bed without using bedrails?: None Help needed moving from lying on your back to sitting on the side of a flat bed without using bedrails?: None Help needed moving  to and from a bed to a chair (including a wheelchair)?: A Little Help needed standing up from a chair using your arms (e.g., wheelchair or bedside chair)?: A Little Help needed to walk in hospital room?: A Little Help needed climbing 3-5 steps with a railing? : A Lot 6 Click Score: 19    End of Session Equipment Utilized During Treatment: Gait belt Activity Tolerance: Patient limited by pain;Treatment limited secondary to medical complications (Comment)(nausea) Patient left: in bed;with call bell/phone within reach;with family/visitor present Nurse Communication: Mobility status PT Visit Diagnosis: Difficulty in walking, not elsewhere classified (R26.2);Muscle weakness (generalized) (M62.81);Pain Pain - Right/Left: Right Pain - part of body: Knee    Time: 7564-3329 PT Time Calculation (min) (ACUTE ONLY): 28 min   Charges:   PT Evaluation $PT Eval Low Complexity: 1 Low PT Treatments $Therapeutic Activity: 8-22 mins       Erick Blinks, SPT  Erick Blinks 12/25/2018, 6:15 PM

## 2018-12-25 NOTE — Progress Notes (Signed)
Orthopedic Tech Progress Note Patient Details:  [name] [birth year] [id number]  Ortho Devices Type of Ortho Device: Knee Immobilizer, Bone foam zero knee Ortho Device/Splint Interventions: Ordered, Application, Adjustment   Post Interventions Patient Tolerated: Well Instructions Provided: Adjustment of device, Care of device   [name] 12/25/2018, 4:36 PM

## 2018-12-25 NOTE — Transfer of Care (Signed)
Immediate Anesthesia Transfer of Care Note  Patient: [name]  Procedure(s) Performed: RIGHT TOTAL KNEE ARTHROPLASTY (Right Knee)  Patient Location: PACU  Anesthesia Type:GA combined with regional for post-op pain  Level of Consciousness: awake, alert , oriented and patient cooperative  Airway & Oxygen Therapy: Patient Spontanous Breathing and Patient connected to nasal cannula oxygen  Post-op Assessment: Report given to RN, Post -op Vital signs reviewed and stable and Patient moving all extremities  Post vital signs: Reviewed and stable  Last Vitals:  Vitals Value Taken Time  BP 133/73 12/25/2018  2:42 PM  Temp 36.6 C 12/25/2018  2:42 PM  Pulse 86 12/25/2018  2:42 PM  Resp 14 12/25/2018  2:42 PM  SpO2 95 % 12/25/2018  2:42 PM  Vitals shown include unvalidated device data.  Last Pain:  Vitals:   12/25/18 0853  TempSrc:   PainSc: 3          Complications: No apparent anesthesia complications

## 2018-12-25 NOTE — Anesthesia Procedure Notes (Signed)
Procedure Name: LMA Insertion Date/Time: 12/25/2018 11:19 AM Performed by: Myna Bright, CRNA Pre-anesthesia Checklist: Patient identified, Emergency Drugs available, Suction available and Patient being monitored Patient Re-evaluated:Patient Re-evaluated prior to induction Oxygen Delivery Method: Circle system utilized Preoxygenation: Pre-oxygenation with 100% oxygen Induction Type: IV induction Ventilation: Mask ventilation without difficulty LMA: LMA inserted LMA Size: 4.0 Tube type: Oral Number of attempts: 1 Placement Confirmation: positive ETCO2 and breath sounds checked- equal and bilateral Tube secured with: Tape Dental Injury: Teeth and Oropharynx as per pre-operative assessment

## 2018-12-25 NOTE — H&P (Signed)
TOTAL KNEE ADMISSION H&P  Patient is being admitted for right total knee arthroplasty.  Subjective:  Chief Complaint:right knee pain.  HPI: [name], 58 y.o. female, has a history of pain and functional disability in the right knee due to arthritis and has failed non-surgical conservative treatments for greater than 12 weeks to includeNSAID's and/or analgesics, corticosteriod injections, viscosupplementation injections and activity modification.  Onset of symptoms was gradual, starting >10 years ago with gradually worsening course since that time. The patient noted prior procedures on the knee to include  arthroscopy on the right knee(s).  Patient currently rates pain in the right knee(s) at 9 out of 10 with activity. Patient has night pain, worsening of pain with activity and weight bearing, pain that interferes with activities of daily living, pain with passive range of motion, crepitus and joint swelling.  Patient has evidence of subchondral sclerosis and joint space narrowing by imaging studies. This patient has had Primarily patellofemoral symptoms which have not responded to conservative treatment over a period of years.  Her pain is currently debilitating.  She also has a component of medial compartment arthritis on MRI scan as well.. There is no active infection.  Patient Active Problem List   Diagnosis Date Noted  . Chronic pain of right knee 06/06/2018  . Pain of right heel 06/06/2018  . 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 Chest pain 02/02/2012  . Leukocytosis 02/02/2012  . Barrett's esophagus 04/28/2011  . FATTY LIVER DISEASE 05/24/2010  . SMOKER 09/08/2009  . DEPRESSION 04/02/2008  . DIABETES MELLITUS 12/25/2007  . HYPERLIPIDEMIA 12/25/2007  . OBESITY 12/25/2007  . HYPERTENSION 12/25/2007  . INTERSTITIAL CYSTITIS 12/25/2007  . [place] DISEASE 12/25/2007   Past Medical History:  Diagnosis Date  . Anxiety   . Barrett esophagus    Dr. Olevia Perches in GI  .  Cancer ([place])    Skin Cancer  . Chronic back pain    stenosis and DDD  . DDD (degenerative disc disease)    s/p back surgeries and steroid injxns  . Depression    takes Fluoxetine daily  . Diabetes mellitus    Victoza daily  . GERD (gastroesophageal reflux disease)    takes Nexium daily  . History of kidney stones   . Hyperlipidemia    hasn't been on Pravastatin in 6-7wks   . Hypertension    takes Amlodipine and HCTZ daily  . Interstitial cystitis   . Joint pain   . Joint swelling   . Muscle spasm    takes Ativan nightly as needed  . Numbness    weakness in right leg  . Pneumonia    fall 2014  . PONV (postoperative nausea and vomiting)   . Spinal headache    blood patch required   . Urinary frequency     Past Surgical History:  Procedure Laterality Date  . adhesions removed from abdomen    . APPENDECTOMY    . BACK SURGERY  12/2007   x 6-fusion x 2  . CESAREAN SECTION  1988/1989  . CHOLECYSTECTOMY    . COLONOSCOPY  2011  . epidural injections    . KNEE ARTHROSCOPY Bilateral   . LAPAROSCOPIC LYSIS OF ADHESIONS N/A 04/15/2014   Procedure: LAPAROSCOPIC LYSIS OF ADHESION;  Surgeon: Joyice Faster. Cornett, MD;  Location: [facility];  Service: General;  Laterality: N/A;  . LAPAROSCOPY N/A 04/15/2014   Procedure: LAPAROSCOPY DIAGNOSTIC;  Surgeon: Joyice Faster. Cornett, MD;  Location: [facility];  Service: General;  Laterality: N/A;  .  LEFT HEART CATHETERIZATION WITH CORONARY ANGIOGRAM N/A 02/03/2012   Procedure: LEFT HEART CATHETERIZATION WITH CORONARY ANGIOGRAM;  Surgeon: Troy Sine, MD;  Location: [facility] CATH LAB;  Service: Cardiovascular;  Laterality: N/A;  . LUMBAR FUSION Right 03/24/2015   Procedure: SI joint fusion - right ;  Surgeon: Karie Chimera, MD;  Location: [facility] NEURO ORS;  Service: Neurosurgery;  Laterality: Right;  SI joint fusion - right   . NECK SURGERY  12/2007  . OOPHORECTOMY    . TOTAL ABDOMINAL HYSTERECTOMY    . upper gi endoscopy      No current facility-administered  medications for this encounter.    Current Outpatient Medications  Medication Sig Dispense Refill Last Dose  . amLODipine (NORVASC) 5 MG tablet Take 5 mg by mouth daily.   Taking  . esomeprazole (NEXIUM) 40 MG capsule Take 1 capsule (40 mg total) by mouth daily before breakfast. 90 capsule 1 Taking  . Evolocumab (REPATHA) 140 MG/ML SOSY Inject 140 mg into the skin every 14 (fourteen) days.   Taking  . FLUoxetine (PROZAC) 40 MG capsule Take 1 capsule (40 mg total) by mouth daily. NEEDS APPT FOR REFILLS 90 capsule 0 Taking  . hydrochlorothiazide (HYDRODIURIL) 25 MG tablet Take 1 tablet (25 mg total) by mouth daily. NEEDS APPT FOR REFILLS 90 tablet 0 Taking  . HYDROcodone-acetaminophen (NORCO) 10-325 MG per tablet Take 1 tablet by mouth every 6 (six) hours as needed for severe pain.    Taking  . insulin lispro (HUMALOG) 100 UNIT/ML injection Inject 2 Units into the skin See admin instructions. Inject 2 units SQ up to 6 times daily as needed for BS > 250     . JARDIANCE 25 MG TABS tablet Take 25 mg by mouth daily.  5 Taking  . LORazepam (ATIVAN) 1 MG tablet Take 1 mg by mouth every 6 (six) hours as needed (for muscle spasms).    Taking  . potassium chloride (K-DUR) 10 MEQ tablet Take 2 tablets (20 mEq total) by mouth daily. (Patient taking differently: Take 10 mEq by mouth daily. ) 20 tablet 0 Taking  . Semaglutide,0.25 or 0.5MG /DOS, (OZEMPIC, 0.25 OR 0.5 MG/DOSE,) 2 MG/1.5ML SOPN Inject 0.5 mg into the skin every Friday.    Taking  . TRESIBA FLEXTOUCH 100 UNIT/ML SOPN FlexTouch Pen Inject 50 Units into the skin daily.   5 Taking  . ACCU-CHEK AVIVA PLUS test strip TEST 3 TIMES DAILY (Patient not taking: Reported on 12/06/2018) 100 strip 5 Not Taking at Unknown time   Allergies  Allergen Reactions  . Latex Anaphylaxis  . Penicillins Anaphylaxis and Other (See Comments)    Has patient had a PCN reaction causing immediate rash, facial/tongue/throat swelling, SOB or lightheadedness with hypotension:  No Has patient had a PCN reaction causing severe rash involving mucus membranes or skin necrosis: No Has patient had a PCN reaction that required hospitalization No Has patient had a PCN reaction occurring within the last 10 years: No If all of the above answers are "NO", then may proceed with Cephalosporin use.  . Erythromycin Hives and Itching  . Ofloxacin Hives and Itching  . Sulfonamide Derivatives Hives and Itching  . Azithromycin Other (See Comments)    "makes my heart rate go crazy"  . Fenofibrate Other (See Comments)    Cramps  . Statins     Social History   Tobacco Use  . Smoking status: Former Smoker    Packs/day: 0.30    Years: 17.00    Pack years:  5.10    Types: Cigarettes  . Smokeless tobacco: Never Used  . Tobacco comment: quit smoking 2 1/2 yrs ago  Substance Use Topics  . Alcohol use: Yes    Comment: rarely    Family History  Problem Relation Age of Onset  . Hypertension Father   . Colon polyps Father   . Heart disease Mother        ischemic  . Gallbladder disease Mother   . Other Mother        rectovaginal fistula  . Allergies Sister   . Asthma Sister      Review of Systems  Musculoskeletal: Positive for joint pain.  All other systems reviewed and are negative.   Objective:  Physical Exam  Constitutional: She appears well-developed.  HENT:  Head: Normocephalic.  Eyes: Pupils are equal, round, and reactive to light.  Neck: Normal range of motion.  Cardiovascular: Normal rate.  Respiratory: Effort normal.  Neurological: She is alert.  Skin: Skin is warm.  Psychiatric: She has a normal mood and affect.  Examination of the right knee demonstrates significant patellofemoral crepitus along with medial joint line tenderness.  Collateral cruciate ligaments are stable.  Range of motion is full extension to about 1 15-1 20 of flexion.  Pedal pulses palpable.  No groin pain with internal X rotation of the leg.  Vital signs in last 24 hours:     Labs:   Estimated body mass index is 33.43 kg/m as calculated from the following:   Height as of 12/14/18: 5\' 6"  (1.676 m).   Weight as of 12/14/18: 93.9 kg.   Imaging Review Plain radiographs demonstrate moderate degenerative joint disease of the right knee(s). The overall alignment isneutral. The bone quality appears to be good for age and reported activity level.      Assessment/Plan:  End stage arthritis, right knee   The patient history, physical examination, clinical judgment of the provider and imaging studies are consistent with end stage degenerative joint disease of the right knee(s) and total knee arthroplasty is deemed medically necessary. The treatment options including medical management, injection therapy arthroscopy and arthroplasty were discussed at length. The risks and benefits of total knee arthroplasty were presented and reviewed. The risks due to aseptic loosening, infection, stiffness, patella tracking problems, thromboembolic complications and other imponderables were discussed. The patient acknowledged the explanation, agreed to proceed with the plan and consent was signed. Patient is being admitted for inpatient treatment for surgery, pain control, PT, OT, prophylactic antibiotics, VTE prophylaxis, progressive ambulation and ADL's and discharge planning. The patient is planning to be discharged home with home health services patient has primarily patellofemoral arthritis and symptoms but also a component of medial compartment arthritis based on MRI scanning.  We discussed total knee replacement versus patellofemoral replacement.  Discussed the pros and cons of each.  Because of her moderate medial compartment arthritis I think she would prefer to go ahead and have complete knee replacement done potentially with press-fit components.  Discussed the risk and benefits including possibility of revision in her lifetime as well as incomplete pain relief and some loss of  motion.  Patient understands the risk and benefits and wishes to proceed.  All questions answered    Anticipated LOS equal to or greater than 2 midnights due to - Age 3 and older with one or more of the following:  - Obesity  - Expected need for hospital services (PT, OT, Nursing) required for safe  discharge  - Anticipated need  for postoperative skilled nursing care or inpatient rehab  - Active co-morbidities: None OR   - Unanticipated findings during/Post Surgery: None  - Patient is a high risk of re-admission due to: None

## 2018-12-25 NOTE — Anesthesia Procedure Notes (Signed)
Anesthesia Regional Block: Adductor canal block   Pre-Anesthetic Checklist: ,, timeout performed, Correct Patient, Correct Site, Correct Laterality, Correct Procedure,, site marked, risks and benefits discussed, Surgical consent,  Pre-op evaluation,  At surgeon's request and post-op pain management  Laterality: Right  Prep: chloraprep       Needles:  Injection technique: Single-shot  Needle Type: Echogenic Stimulator Needle     Needle Length: 9cm  Needle Gauge: 21     Additional Needles:   Procedures:,,,, ultrasound used (permanent image in chart),,,,  Narrative:  Start time: 12/25/2018 9:50 AM End time: 12/25/2018 10:00 AM Injection made incrementally with aspirations every 5 mL.  Performed by: Personally  Anesthesiologist: Murvin Natal, MD  Additional Notes: Functioning IV was confirmed and monitors were applied. A time-out was performed. Hand hygiene and sterile gloves were used. The thigh was placed in a frog-leg position and prepped in a sterile fashion. A 3mm 21ga Arrow echogenic stimulator needle was placed using ultrasound guidance.  Negative aspiration and negative test dose prior to incremental administration of local anesthetic. The patient tolerated the procedure well.

## 2018-12-25 NOTE — Brief Op Note (Signed)
12/25/2018  2:41 PM  PATIENT:  Patrice Paradise  58 y.o. female  PRE-OPERATIVE DIAGNOSIS:  right knee osteoarthritis  POST-OPERATIVE DIAGNOSIS:  right knee osteoarthritis  PROCEDURE:  Procedure(s): RIGHT TOTAL KNEE ARTHROPLASTY  SURGEON:  Surgeon(s): Marlou Sa, Tonna Corner, MD  ASSISTANT: Laure Kidney rnfa  ANESTHESIA:   general  EBL: 100 ml    Total I/O In: 1400 [I.V.:1400] Out: 100 [Blood:100]  BLOOD ADMINISTERED: none  DRAINS: none   LOCAL MEDICATIONS USED:  none  SPECIMEN:  No Specimen  COUNTS:  YES  TOURNIQUET:  90 min at 300 mm hg  DICTATION: .Other Dictation: Dictation Number [id number]  PLAN OF CARE: Admit to inpatient   PATIENT DISPOSITION:  PACU - hemodynamically stable

## 2018-12-26 ENCOUNTER — Encounter (HOSPITAL_COMMUNITY): Payer: Self-pay | Admitting: Orthopedic Surgery

## 2018-12-26 NOTE — Op Note (Signed)
NAME: [name], [name] MEDICAL RECORD [id number] ACCOUNT [id number] DATE OF BIRTH:Dec 01, 1960 FACILITY: [facility] LOCATION: [facility] PHYSICIAN:Everlyn Farabaugh Randel Pigg, MD  OPERATIVE REPORT  DATE OF PROCEDURE:  12/25/2018  PREOPERATIVE DIAGNOSIS:  Right knee arthritis.  POSTOPERATIVE DIAGNOSIS:  Right knee arthritis.  PROCEDURE:  Right total knee replacement using [employer]ryker press-fit components, 6 femur, 5 tibia, 9 mm polyethylene deep dish cruciate retaining polyethylene spacer and 32 mm 3 peg press-fit, patellofemoral component is press-fit and cruciate retaining.  SURGEON:  Meredith Pel, MD  ASSISTANT:  Laure Kidney, RNFA  INDICATIONS:  The patient is a 58 year old patient with severe right knee arthritis who presents for operative management after explanation of risks and benefits.  PROCEDURE IN DETAIL:  The patient was brought to the operating room where general anesthetic was induced.  Preoperative antibiotics administered.  Timeout was called.  Right leg was prescrubbed with alcohol and Betadine, allowed to air dry.  Prep with  DuraPrep solution and draped in a sterile manner.  [name] was used to cover the operative field.  Leg was elevated, exsanguinated with Esmarch wrap.  Tourniquet was inflated.  Timeout was called.  Anterior approach to knee was made.  Skin and subcutaneous  tissue were sharply divided.  Median parapatellar approach was made and marked with a #1 Vicryl suture.  Soft tissue removed from the anterior distal femur.  The patient had had previous lateral retinacular release.  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  Following the exposure, the fat pad was partially excised.  At this time,  the patella was everted.  Intramedullary alignment was then used to cut initially 9 mm off the least affected medial  tibial plateau.  This was later revised 2 more millimeters.  Attention was then directed towards the femoral surface.  Initially, an 8 mm  cut was made which was later revised to 10 mm cut.  This allowed full extension with a 9 mm spacer block.  At this time, chamfer cuts were made.  Posterior cuts were made.  PCL was preserved.  The tibia was keel punched.  The patella was cut down from  24 to 14.  Trial PEG was placed.  The patient had good extension, good flexion, good stability to varus and valgus stress at 0, 30 and 90 degrees and excellent patellar tracking.  Trial components were removed.  Thorough irrigation performed.  The  capsule was anesthetized using Exparel.  Tranexamic acid sponge was placed for 3 minutes into the incision.  At this time, true components were press-fit into position with excellent press-fit achieved.  The patient's bone quality was excellent.  At this  time, thorough irrigation was performed.  Tourniquet was released after 98 minutes at 300 mmHg.  At this time, bleeding points encountered controlled using electrocautery.  The incision was then closed over a bolster using #1 Vicryl suture followed by  interrupted inverted 0 Vicryl suture, 2-0 Vicryl suture and a 3-0 Monocryl.  The patient tolerated the procedure well without immediate complications.  He was transferred to the recovery room in stable condition.  TN/NUANCE  D:12/25/2018 T:12/26/2018 JOB:[id number]

## 2018-12-26 NOTE — Progress Notes (Signed)
Physical Therapy Treatment Patient Details Name: [name] MRN: [id number] DOB: Jun 16, 1961 Today's Date: 12/26/2018    History of Present Illness Patient is 58 y/o female s/p R TKA. PMH includes HTN, HLD, anxiety, lumbar fusion, DDD, DM, and skin cancer.     PT Comments    Patient seen for mobility progression. Pt is making progress toward PT goals and tolerated gait training distance of 200 ft. Pt with c/o nausea while mobilizing without emesis. Continue to progress as tolerated.    Follow Up Recommendations  Follow surgeon's recommendation for DC plan and follow-up therapies     Equipment Recommendations  None recommended by PT    Recommendations for Other Services       Precautions / Restrictions Precautions Precautions: Fall;Knee Precaution Comments: reviewed knee precautions/positioning with patient Restrictions Weight Bearing Restrictions: Yes RLE Weight Bearing: Weight bearing as tolerated    Mobility  Bed Mobility Overal bed mobility: Needs Assistance Bed Mobility: Supine to Sit     Supine to sit: Min assist     General bed mobility comments: assist to bring R LE to EOB  Transfers Overall transfer level: Needs assistance Equipment used: Rolling walker (2 wheeled) Transfers: Sit to/from Stand Sit to Stand: Min guard         General transfer comment: min guard for safety  Ambulation/Gait Ambulation/Gait assistance: Min guard Gait Distance (Feet): 200 Feet Assistive device: Rolling walker (2 wheeled) Gait Pattern/deviations: Step-through pattern;Decreased step length - left;Decreased stance time - right;Decreased weight shift to right;Antalgic;Decreased stride length Gait velocity: decreased   General Gait Details: cues for safe use of AD; improving step through pattern and weight bearing with distance; c/o nausea while ambulating   Stairs             Wheelchair Mobility    Modified Rankin (Stroke Patients Only)       Balance  Overall balance assessment: Needs assistance Sitting-balance support: No upper extremity supported;Feet supported Sitting balance-Leahy Scale: Good     Standing balance support: Bilateral upper extremity supported Standing balance-Leahy Scale: Poor                              Cognition Arousal/Alertness: Awake/alert Behavior During Therapy: WFL for tasks assessed/performed Overall Cognitive Status: Within Functional Limits for tasks assessed                                        Exercises      General Comments        Pertinent Vitals/Pain Pain Assessment: Faces Faces Pain Scale: Hurts little more Pain Location: R knee/thigh Pain Descriptors / Indicators: Grimacing;Guarding;Sore Pain Intervention(s): Limited activity within patient's tolerance;Monitored during session;Premedicated before session;Repositioned    Home Living                      Prior Function            PT Goals (current goals can now be found in the care plan section) Acute Rehab PT Goals Patient Stated Goal: go home Progress towards PT goals: Progressing toward goals    Frequency    7X/week      PT Plan Current plan remains appropriate    Co-evaluation              AM-PAC PT "6 Clicks" Mobility   Outcome Measure  Help needed turning from your back to your side while in a flat bed without using bedrails?: None Help needed moving from lying on your back to sitting on the side of a flat bed without using bedrails?: None Help needed moving to and from a bed to a chair (including a wheelchair)?: A Little Help needed standing up from a chair using your arms (e.g., wheelchair or bedside chair)?: A Little Help needed to walk in hospital room?: A Little Help needed climbing 3-5 steps with a railing? : A Little 6 Click Score: 20    End of Session Equipment Utilized During Treatment: Gait belt Activity Tolerance: Patient tolerated treatment  well Patient left: with call bell/phone within reach;in chair;with family/visitor present Nurse Communication: Mobility status PT Visit Diagnosis: Difficulty in walking, not elsewhere classified (R26.2);Muscle weakness (generalized) (M62.81);Pain Pain - Right/Left: Right Pain - part of body: Knee     Time: 1110-1130 PT Time Calculation (min) (ACUTE ONLY): 20 min  Charges:  $Gait Training: 8-22 mins                     Earney Navy, PTA Acute Rehabilitation Services Pager: [phone] Office: [phone]     Darliss Cheney 12/26/2018, 11:36 AM

## 2018-12-26 NOTE — Progress Notes (Signed)
Physical Therapy Treatment Patient Details Name: [name] MRN: [id number] DOB: 05-02-1961 Today's Date: 12/26/2018    History of Present Illness Patient is 58 y/o female s/p R TKA. PMH includes HTN, HLD, anxiety, lumbar fusion, DDD, DM, and skin cancer.     PT Comments    Patient seen for R LE strengthening/ROM exercises. HEP handout reviewed and pt led through supine therex. Seated therex/OOB mobility deferred due to c/o nausea. Pt reports receiving nausea medication prior to session. Continue to progress as tolerated.    Follow Up Recommendations  Follow surgeon's recommendation for DC plan and follow-up therapies     Equipment Recommendations  None recommended by PT    Recommendations for Other Services       Precautions / Restrictions Precautions Precautions: Fall;Knee Precaution Comments: reviewed knee precautions/positioning with patient Restrictions Weight Bearing Restrictions: Yes RLE Weight Bearing: Weight bearing as tolerated    Mobility  Bed Mobility                  Transfers                    Ambulation/Gait                 Stairs             Wheelchair Mobility    Modified Rankin (Stroke Patients Only)       Balance                                            Cognition Arousal/Alertness: Awake/alert Behavior During Therapy: WFL for tasks assessed/performed Overall Cognitive Status: Within Functional Limits for tasks assessed                                        Exercises Total Joint Exercises Quad Sets: AROM;Strengthening;Right Short Arc Quad: AAROM;Strengthening;Right Heel Slides: AAROM;Strengthening;Right Hip ABduction/ADduction: AROM;Strengthening;Right Straight Leg Raises: AAROM;Strengthening;Right    General Comments        Pertinent Vitals/Pain Pain Assessment: Faces Faces Pain Scale: Hurts even more Pain Location: R knee Pain Descriptors /  Indicators: Grimacing;Guarding;Sore Pain Intervention(s): Limited activity within patient's tolerance;Monitored during session;Premedicated before session;Repositioned    Home Living                      Prior Function            PT Goals (current goals can now be found in the care plan section) Acute Rehab PT Goals Patient Stated Goal: go home Progress towards PT goals: Progressing toward goals    Frequency    7X/week      PT Plan Current plan remains appropriate    Co-evaluation              AM-PAC PT "6 Clicks" Mobility   Outcome Measure  Help needed turning from your back to your side while in a flat bed without using bedrails?: A Little Help needed moving from lying on your back to sitting on the side of a flat bed without using bedrails?: A Little Help needed moving to and from a bed to a chair (including a wheelchair)?: A Little Help needed standing up from a chair using your arms (e.g., wheelchair or bedside chair)?: A Little Help  needed to walk in hospital room?: A Little Help needed climbing 3-5 steps with a railing? : A Little 6 Click Score: 18    End of Session   Activity Tolerance: Patient limited by pain;Other (comment)(nausea) Patient left: with call bell/phone within reach;with family/visitor present;in bed Nurse Communication: Mobility status PT Visit Diagnosis: Difficulty in walking, not elsewhere classified (R26.2);Muscle weakness (generalized) (M62.81);Pain Pain - Right/Left: Right Pain - part of body: Knee     Time: 5436-0677 PT Time Calculation (min) (ACUTE ONLY): 21 min  Charges:  $Therapeutic Exercise: 8-22 mins                     Earney Navy, PTA Acute Rehabilitation Services Pager: [phone] Office: [phone]     Darliss Cheney 12/26/2018, 3:42 PM

## 2018-12-26 NOTE — Care Management Note (Addendum)
Case Management Note  Patient Details  Name: [name] MRN: [id number] Date of Birth: 1961/05/01  Subjective/Objective:  58 yr old female s/p right total knee arthroplasty.                  Action/Plan: Case manager spoke with patient concerning discharge and DME needs. Patient was preoperatively setup with [facility], no changes. DME has been delivered to her home and she will have family support at discharge.    Expected Discharge Date:     12/27/18             Expected Discharge Plan:  [place]  In-House Referral:  NA  Discharge planning Services  CM Consult  Post Acute Care Choice:  Home Health, Durable Medical Equipment Choice offered to:  Patient, Spouse  DME Arranged:  3-N-1, Walker rolling, CPM DME Agency:  [employer]  HH Arranged:  PT [place] Agency:  [facility] (formerly [employer])  Status of Service:  Completed, signed off  If discussed at [employer], dates discussed:    Additional Comments:  Ninfa Meeker, RN 12/26/2018, 11:30 AM

## 2018-12-26 NOTE — Plan of Care (Signed)

## 2018-12-26 NOTE — Progress Notes (Signed)
Subjective: Pt stable - pain ok   Objective: Vital signs in last 24 hours: Temp:  [97.2 F (36.2 C)-99.5 F (37.5 C)] 99.5 F (37.5 C) (02/26 0531) Pulse Rate:  [64-95] 65 (02/26 0531) Resp:  [11-57] 16 (02/26 0531) BP: (100-150)/(57-88) 119/68 (02/26 0531) SpO2:  [93 %-100 %] 97 % (02/26 0531) Weight:  [83.5 kg] 83.5 kg (02/25 0837)  Intake/Output from previous day: 02/25 0701 - 02/26 0700 In: 1740 [P.O.:240; I.V.:1500] Out: 100 [Blood:100] Intake/Output this shift: No intake/output data recorded.  Exam:  Dorsiflexion/Plantar flexion intact  Labs: No results for input(s): HGB in the last 72 hours. No results for input(s): WBC, RBC, HCT, PLT in the last 72 hours. No results for input(s): NA, K, CL, CO2, BUN, CREATININE, GLUCOSE, CALCIUM in the last 72 hours. No results for input(s): LABPT, INR in the last 72 hours.  Assessment/Plan: Plan to dc ace today - cpm and pt - dc to home am   G Alphonzo Severance 12/26/2018, 8:35 AM

## 2018-12-27 MED ORDER — DOCUSATE SODIUM 100 MG PO CAPS: 100.0000 mg | ORAL_CAPSULE | Freq: Two times a day (BID) | ORAL | 0 refills | Status: DC

## 2018-12-27 MED ORDER — TRAMADOL HCL 50 MG PO TABS: 50.0000 mg | ORAL_TABLET | Freq: Four times a day (QID) | ORAL | 0 refills | Status: DC

## 2018-12-27 MED ORDER — OXYCODONE HCL 5 MG PO TABS: 5.0000 mg | ORAL_TABLET | ORAL | 0 refills | Status: DC | PRN

## 2018-12-27 MED ORDER — ASPIRIN 81 MG PO CHEW: 81.0000 mg | CHEWABLE_TABLET | Freq: Two times a day (BID) | ORAL | 0 refills | Status: DC

## 2018-12-27 MED ORDER — METHOCARBAMOL 500 MG PO TABS: 500.0000 mg | ORAL_TABLET | Freq: Four times a day (QID) | ORAL | 0 refills | Status: DC | PRN

## 2018-12-27 MED ORDER — ACETAMINOPHEN 325 MG PO TABS: 325.0000 mg | ORAL_TABLET | Freq: Four times a day (QID) | ORAL | 0 refills | Status: DC | PRN

## 2018-12-27 NOTE — Progress Notes (Signed)
Subjective: Patient stable.  Pain controlled.  Has been able to ambulate in the room   Objective: Vital signs in last 24 hours: Temp:  [99.2 F (37.3 C)-100.7 F (38.2 C)] 99.2 F (37.3 C) (02/26 2031) Pulse Rate:  [71-72] 72 (02/26 2031) Resp:  [18] 18 (02/26 2031) BP: (141-150)/(66-75) 150/75 (02/26 2031) SpO2:  [96 %-97 %] 97 % (02/26 2031)  Intake/Output from previous day: 02/26 0701 - 02/27 0700 In: 720 [P.O.:720] Out: -  Intake/Output this shift: No intake/output data recorded.  Exam:  Sensation intact distally Intact pulses distally  Labs: No results for input(s): HGB in the last 72 hours. No results for input(s): WBC, RBC, HCT, PLT in the last 72 hours. No results for input(s): NA, K, CL, CO2, BUN, CREATININE, GLUCOSE, CALCIUM in the last 72 hours. No results for input(s): LABPT, INR in the last 72 hours.  Assessment/Plan: Plan at this time is discharge to home.  Prescriptions done.  Continue with CPM use.  Would like for her to have 1 more therapy visit this morning prior to discharge this afternoon   [employer] 12/27/2018, 7:54 AM

## 2018-12-27 NOTE — Progress Notes (Signed)
Physical Therapy Treatment Patient Details Name: [name] MRN: [id number] DOB: [birth year] Today's Date: 12/27/2018    History of Present Illness Patient is 58 y/o female s/p R TKA. PMH includes HTN, HLD, anxiety, lumbar fusion, DDD, DM, and skin cancer.     PT Comments    Patient seen for mobility progression. Pt requires min A for bed mobility due to inability to lift R LE in/out of bed due to pain and min guard/supervision for functional transfers and gait training. Pt is able to safely ascend/descend step simulating home entrance. Pt continues to have significant pain with R knee flexion and nausea. Pt also reports that she has eaten very little since surgery. Continue to progress as tolerated.     Follow Up Recommendations  Follow surgeon's recommendation for DC plan and follow-up therapies     Equipment Recommendations  None recommended by PT    Recommendations for Other Services       Precautions / Restrictions Precautions Precautions: Fall;Knee Precaution Comments: reviewed knee precautions/positioning with patient Restrictions Weight Bearing Restrictions: Yes RLE Weight Bearing: Weight bearing as tolerated    Mobility  Bed Mobility Overal bed mobility: Needs Assistance Bed Mobility: Supine to Sit;Sit to Supine     Supine to sit: Min assist Sit to supine: Min assist   General bed mobility comments: assist to lift R LE  Transfers Overall transfer level: Needs assistance Equipment used: Rolling walker (2 wheeled) Transfers: Sit to/from Stand Sit to Stand: Min guard         General transfer comment: cues for safe hand placement  Ambulation/Gait Ambulation/Gait assistance: Supervision Gait Distance (Feet): 200 Feet Assistive device: Rolling walker (2 wheeled) Gait Pattern/deviations: Step-through pattern;Decreased stance time - right;Decreased step length - left;Decreased weight shift to right;Antalgic Gait velocity: decreased   General Gait  Details: cues for R quad activation during stance phase and increased R knee flexion during swing phase   Stairs Stairs: Yes Stairs assistance: Min assist Stair Management: No rails;Step to pattern;Forwards;With walker Number of Stairs: (single step) General stair comments: cues for sequencing and technique   Wheelchair Mobility    Modified Rankin (Stroke Patients Only)       Balance Overall balance assessment: Needs assistance Sitting-balance support: Feet supported;Single extremity supported Sitting balance-Leahy Scale: Fair     Standing balance support: Bilateral upper extremity supported Standing balance-Leahy Scale: Poor                              Cognition Arousal/Alertness: Awake/alert Behavior During Therapy: WFL for tasks assessed/performed Overall Cognitive Status: Within Functional Limits for tasks assessed                                        Exercises      General Comments        Pertinent Vitals/Pain Pain Assessment: Faces Faces Pain Scale: Hurts even more Pain Location: R knee Pain Descriptors / Indicators: Grimacing;Guarding;Sore Pain Intervention(s): Limited activity within patient's tolerance;Monitored during session;Repositioned;RN gave pain meds during session    Home Living                      Prior Function            PT Goals (current goals can now be found in the care plan section) Acute Rehab PT Goals Patient  Stated Goal: go home Progress towards PT goals: Progressing toward goals    Frequency    7X/week      PT Plan Current plan remains appropriate    Co-evaluation              AM-PAC PT "6 Clicks" Mobility   Outcome Measure  Help needed turning from your back to your side while in a flat bed without using bedrails?: A Little Help needed moving from lying on your back to sitting on the side of a flat bed without using bedrails?: A Little Help needed moving to and from  a bed to a chair (including a wheelchair)?: A Little Help needed standing up from a chair using your arms (e.g., wheelchair or bedside chair)?: A Little Help needed to walk in hospital room?: A Little Help needed climbing 3-5 steps with a railing? : A Little 6 Click Score: 18    End of Session Equipment Utilized During Treatment: Gait belt Activity Tolerance: Patient limited by pain;Other (comment)(nausea) Patient left: with call bell/phone within reach;with family/visitor present;in bed Nurse Communication: Mobility status PT Visit Diagnosis: Difficulty in walking, not elsewhere classified (R26.2);Muscle weakness (generalized) (M62.81);Pain Pain - Right/Left: Right Pain - part of body: Knee     Time: 3557-3220 PT Time Calculation (min) (ACUTE ONLY): 23 min  Charges:  $Gait Training: 23-37 mins                     Earney Navy, PTA Acute Rehabilitation Services Pager: [phone] Office: [phone]     Darliss Cheney 12/27/2018, 9:59 AM

## 2018-12-27 NOTE — Progress Notes (Signed)
Patient discharging home today. Discharge instructions explained to patient and she verbalized understanding. Took all personal belongings. No further questions or concerns voiced.  

## 2018-12-27 NOTE — Plan of Care (Signed)

## 2018-12-28 ENCOUNTER — Telehealth (INDEPENDENT_AMBULATORY_CARE_PROVIDER_SITE_OTHER): Payer: Self-pay | Admitting: Orthopedic Surgery

## 2018-12-28 NOTE — Telephone Encounter (Signed)
Verbal order given on VM

## 2018-12-28 NOTE — Telephone Encounter (Signed)
Received voicemail message from Edith Endave with [facility] needing verbal orders for HHPT 1 Wk 1, 2 Wk 1 and twice a wk for 1 Wk. The number to contact Verdis Frederickson is [phone]

## 2018-12-31 NOTE — Discharge Summary (Signed)
Physician Discharge Summary  Patient ID: [name] MRN: [id number] DOB/AGE: 1960/12/19 58 y.o.  Admit date: 12/25/2018 Discharge date:   Admission Diagnoses:  Active Problems:   Arthritis of knee   Discharge Diagnoses:  Same  Surgeries: Procedure(s): RIGHT TOTAL KNEE ARTHROPLASTY on 12/25/2018   Consultants:   Discharged Condition: Stable  Hospital Course: [name] is an 58 y.o. female who was admitted 12/25/2018 with a chief complaint of right knee pain degenerative, and found to have a diagnosis of right knee arthritis.  They were brought to the operating room on 12/25/2018 and underwent the above named procedures.  Patient tolerated procedure well with immediate complication.  She was seen by therapy on postop day #1 and 2.  She mobilized well.  Home on postop day #2 in good condition with oxycodone for pain and aspirin for DVT prophylaxis.  Home health physical therapy and range of motion to be done via CPM machine.  Follow-up with me in about 10 days.  Antibiotics given:  Anti-infectives (From admission, onward)   Start     Dose/Rate Route Frequency Ordered Stop   12/25/18 2000  vancomycin (VANCOCIN) IVPB 1000 mg/200 mL premix     1,000 mg 200 mL/hr over 60 Minutes Intravenous Every 12 hours 12/25/18 1539 12/26/18 0005   12/25/18 0830  vancomycin (VANCOCIN) IVPB 1000 mg/200 mL premix     1,000 mg 200 mL/hr over 60 Minutes Intravenous On call to O.R. 12/25/18 0825 12/25/18 1008    .  Recent vital signs:  Vitals:   12/26/18 1342 12/26/18 2031  BP: (!) 141/66 (!) 150/75  Pulse: 71 72  Resp:  18  Temp: (!) 100.7 F (38.2 C) 99.2 F (37.3 C)  SpO2: 96% 97%    Recent laboratory studies:  Results for orders placed or performed during the hospital encounter of 12/25/18  Glucose, capillary  Result Value Ref Range   Glucose-Capillary 102 (H) 70 - 99 mg/dL   Comment 1 Notify RN    Comment 2 Document in Chart   Glucose, capillary  Result Value Ref Range   Glucose-Capillary 124 (H) 70 - 99 mg/dL   Comment 1 Notify RN    Comment 2 Document in Chart   Glucose, capillary  Result Value Ref Range   Glucose-Capillary 146 (H) 70 - 99 mg/dL  Glucose, capillary  Result Value Ref Range   Glucose-Capillary 200 (H) 70 - 99 mg/dL    Discharge Medications:   Allergies as of 12/27/2018      Reactions   Latex Anaphylaxis   Penicillins Anaphylaxis, Other (See Comments)   Has patient had a PCN reaction causing immediate rash, facial/tongue/throat swelling, SOB or lightheadedness with hypotension: No Has patient had a PCN reaction causing severe rash involving mucus membranes or skin necrosis: No Has patient had a PCN reaction that required hospitalization No Has patient had a PCN reaction occurring within the last 10 years: No If all of the above answers are "NO", then may proceed with Cephalosporin use.   Erythromycin Hives, Itching   Ofloxacin Hives, Itching   Sulfonamide Derivatives Hives, Itching   Azithromycin Other (See Comments)   "makes my heart rate go crazy"   Fenofibrate Other (See Comments)   Cramps   Statins       Medication List    STOP taking these medications   ACCU-CHEK AVIVA PLUS test strip Generic drug:  glucose blood   HYDROcodone-acetaminophen 10-325 MG tablet Commonly known as:  NORCO  TAKE these medications   acetaminophen 325 MG tablet Commonly known as:  TYLENOL Take 1-2 tablets (325-650 mg total) by mouth every 6 (six) hours as needed for mild pain (pain score 1-3 or temp > 100.5).   amLODipine 5 MG tablet Commonly known as:  NORVASC Take 5 mg by mouth daily.   aspirin 81 MG chewable tablet Chew 1 tablet (81 mg total) by mouth 2 (two) times daily.   docusate sodium 100 MG capsule Commonly known as:  COLACE Take 1 capsule (100 mg total) by mouth 2 (two) times daily.   esomeprazole 40 MG capsule Commonly known as:  NEXIUM Take 1 capsule (40 mg total) by mouth daily before breakfast.   FLUoxetine 40  MG capsule Commonly known as:  PROZAC Take 1 capsule (40 mg total) by mouth daily. NEEDS APPT FOR REFILLS   hydrochlorothiazide 25 MG tablet Commonly known as:  HYDRODIURIL Take 1 tablet (25 mg total) by mouth daily. NEEDS APPT FOR REFILLS   insulin lispro 100 UNIT/ML injection Commonly known as:  HUMALOG Inject 2 Units into the skin See admin instructions. Inject 2 units SQ up to 6 times daily as needed for BS > 250   JARDIANCE 25 MG Tabs tablet Generic drug:  empagliflozin Take 25 mg by mouth daily.   LORazepam 1 MG tablet Commonly known as:  ATIVAN Take 1 mg by mouth every 6 (six) hours as needed (for muscle spasms).   methocarbamol 500 MG tablet Commonly known as:  ROBAXIN Take 1 tablet (500 mg total) by mouth every 6 (six) hours as needed for muscle spasms.   oxyCODONE 5 MG immediate release tablet Commonly known as:  Oxy IR/ROXICODONE Take 1-2 tablets (5-10 mg total) by mouth every 4 (four) hours as needed for moderate pain (pain score 4-6).   OZEMPIC (0.25 OR 0.5 MG/DOSE) 2 MG/1.5ML Sopn Generic drug:  Semaglutide(0.25 or 0.5MG /DOS) Inject 0.5 mg into the skin every Friday.   potassium chloride 10 MEQ tablet Commonly known as:  K-DUR Take 2 tablets (20 mEq total) by mouth daily. What changed:  how much to take   REPATHA 140 MG/ML Sosy Generic drug:  Evolocumab Inject 140 mg into the skin every 14 (fourteen) days.   traMADol 50 MG tablet Commonly known as:  ULTRAM Take 1 tablet (50 mg total) by mouth every 6 (six) hours.   TRESIBA FLEXTOUCH 100 UNIT/ML Sopn FlexTouch Pen Generic drug:  insulin degludec Inject 50 Units into the skin daily.       Diagnostic Studies: No results found.  Disposition:   Discharge Instructions    Call MD / Call 911   Complete by:  As directed    If you experience chest pain or shortness of breath, CALL 911 and be transported to the hospital emergency room.  If you develope a fever above 101 F, pus (white drainage) or  increased drainage or redness at the wound, or calf pain, call your surgeon's office.   Constipation Prevention   Complete by:  As directed    Drink plenty of fluids.  Prune juice may be helpful.  You may use a stool softener, such as Colace (over the counter) 100 mg twice a day.  Use MiraLax (over the counter) for constipation as needed.   Diet - low sodium heart healthy   Complete by:  As directed    Discharge instructions   Complete by:  As directed    Weightbearing as tolerated with crutches CPM machine 1 hour 3 times  a day.  Increase degrees daily Okay to shower.  Dressing is waterproof. Return to clinic in 10 days for clinical recheck   Face-to-face encounter (required for Medicare/Medicaid patients)   Complete by:  As directed    I Anderson Malta certify that this patient is under my care and that I, or a nurse practitioner or physician's assistant working with me, had a face-to-face encounter that meets the physician face-to-face encounter requirements with this patient on 12/27/2018. The encounter with the patient was in whole, or in part for the following medical condition(s) which is the primary reason for home health care (List medical condition): Patient needs 3 times a week home health physical therapy for 2 weeks until she is able to mobilize enough to drive to physical therapy herself.  She would benefit from a limited amount of home health physical therapy in order to facilitate strengthening and mobilization.   The encounter with the patient was in whole, or in part, for the following medical condition, which is the primary reason for home health care:  Knee arthritis   I certify that, based on my findings, the following services are medically necessary home health services:  Physical therapy   Reason for Medically Necessary Home Health Services:  Therapy- Therapeutic Exercises to Increase Strength and Endurance   My clinical findings support the need for the above services:  Pain  interferes with ambulation/mobility   Further, I certify that my clinical findings support that this patient is homebound due to:  Pain interferes with ambulation/mobility   Home Health   Complete by:  As directed    To provide the following care/treatments:  PT   Increase activity slowly as tolerated   Complete by:  As directed       Follow-up Information    [facility]   Specialty:  [place] Why:  A representative from [facility] will contact you to arrange start date and time for your therapy. Contact information: [address] [place] [address] [phone]            Signed: Anderson Malta 12/31/2018, 5:12 PM

## 2019-01-04 ENCOUNTER — Ambulatory Visit (INDEPENDENT_AMBULATORY_CARE_PROVIDER_SITE_OTHER): Payer: Medicare Other | Admitting: Orthopedic Surgery

## 2019-01-04 ENCOUNTER — Ambulatory Visit (INDEPENDENT_AMBULATORY_CARE_PROVIDER_SITE_OTHER): Payer: Medicare Other

## 2019-01-04 DIAGNOSIS — Z96651 Presence of right artificial knee joint: Secondary | ICD-10-CM

## 2019-01-04 MED ORDER — HYDROCODONE-ACETAMINOPHEN 5-325 MG PO TABS: ORAL_TABLET | ORAL | 0 refills | Status: AC

## 2019-01-05 ENCOUNTER — Encounter (INDEPENDENT_AMBULATORY_CARE_PROVIDER_SITE_OTHER): Payer: Self-pay | Admitting: Orthopedic Surgery

## 2019-01-05 NOTE — Progress Notes (Signed)
Post-Op Visit Note   Patient: [name]           Date of Birth: October 10, 1961           MRN: [id number] Visit Date: 01/04/2019 PCP: Maylon Peppers, MD   Assessment & Plan:  Chief Complaint:  Chief Complaint  Patient presents with  . Right Knee - Follow-up   Visit Diagnoses:  1. S/P total knee arthroplasty, right     Plan: Patient presents now 2 weeks out right total knee replacement.  Exam she has an intact incision.  Flexion is about 80 degrees cold with lacking about 5 to 7 degrees of full extension.  Radiographs look good.  I will change her over to [facility] and start her in physical therapy.  Follow-up with me in 4 weeks.  Follow-Up Instructions: Return in about 4 weeks (around 02/01/2019).   Orders:  Orders Placed This Encounter  Procedures  . XR Knee 1-2 Views Right   Meds ordered this encounter  Medications  . HYDROcodone-acetaminophen (NORCO/VICODIN) 5-325 MG tablet    Sig: 1 po q 4-6hrs prn pain    Dispense:  45 tablet    Refill:  0    Imaging: Xr Knee 1-2 Views Right  Result Date: 01/05/2019 AP lateral right knee reviewed.  Total knee prosthesis in good position alignment with no complicating features.  Alignment appears intact.   PMFS History: Patient Active Problem List   Diagnosis Date Noted  . Arthritis of knee 12/25/2018  . Chronic pain of right knee 06/06/2018  . Pain of right heel 06/06/2018  . 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 Chest pain 02/02/2012  . Leukocytosis 02/02/2012  . Barrett's esophagus 04/28/2011  . FATTY LIVER DISEASE 05/24/2010  . SMOKER 09/08/2009  . DEPRESSION 04/02/2008  . DIABETES MELLITUS 12/25/2007  . HYPERLIPIDEMIA 12/25/2007  . OBESITY 12/25/2007  . HYPERTENSION 12/25/2007  . INTERSTITIAL CYSTITIS 12/25/2007  . [place] DISEASE 12/25/2007   Past Medical History:  Diagnosis Date  . Anxiety   . Barrett esophagus    Dr. Olevia Perches in GI  . Cancer ([place])    Skin Cancer  . Chronic back pain    stenosis  and DDD  . DDD (degenerative disc disease)    s/p back surgeries and steroid injxns  . Depression    takes Fluoxetine daily  . Diabetes mellitus    Victoza daily  . GERD (gastroesophageal reflux disease)    takes Nexium daily  . History of kidney stones   . Hyperlipidemia    hasn't been on Pravastatin in 6-7wks   . Hypertension    takes Amlodipine and HCTZ daily  . Interstitial cystitis   . Joint pain   . Joint swelling   . Muscle spasm    takes Ativan nightly as needed  . Numbness    weakness in right leg  . Pneumonia    fall 2014  . PONV (postoperative nausea and vomiting)   . Spinal headache    blood patch required   . Urinary frequency     Family History  Problem Relation Age of Onset  . Hypertension Father   . Colon polyps Father   . Heart disease Mother        ischemic  . Gallbladder disease Mother   . Other Mother        rectovaginal fistula  . Allergies Sister   . Asthma Sister     Past Surgical History:  Procedure Laterality Date  . adhesions  removed from abdomen    . APPENDECTOMY    . BACK SURGERY  12/2007   x 6-fusion x 2  . CESAREAN SECTION  1988/1989  . CHOLECYSTECTOMY    . COLONOSCOPY  2011  . epidural injections    . KNEE ARTHROSCOPY Bilateral   . LAPAROSCOPIC LYSIS OF ADHESIONS N/A 04/15/2014   Procedure: LAPAROSCOPIC LYSIS OF ADHESION;  Surgeon: Joyice Faster. Cornett, MD;  Location: [facility];  Service: General;  Laterality: N/A;  . LAPAROSCOPY N/A 04/15/2014   Procedure: LAPAROSCOPY DIAGNOSTIC;  Surgeon: Joyice Faster. Cornett, MD;  Location: [facility];  Service: General;  Laterality: N/A;  . LEFT HEART CATHETERIZATION WITH CORONARY ANGIOGRAM N/A 02/03/2012   Procedure: LEFT HEART CATHETERIZATION WITH CORONARY ANGIOGRAM;  Surgeon: Troy Sine, MD;  [facility] CATH LAB;  Service: Cardiovascular;  Laterality: N/A;  . LUMBAR FUSION Right 03/24/2015   Procedure: SI joint fusion - right ;  Surgeon: Karie Chimera, MD;  Location: [facility] NEURO ORS;  Service:  Neurosurgery;  Laterality: Right;  SI joint fusion - right   . NECK SURGERY  12/2007  . OOPHORECTOMY    . TOTAL ABDOMINAL HYSTERECTOMY    . TOTAL KNEE ARTHROPLASTY Right 12/25/2018  . TOTAL KNEE ARTHROPLASTY Right 12/25/2018   Procedure: RIGHT TOTAL KNEE ARTHROPLASTY;  Surgeon: Meredith Pel, MD;  Location: [facility];  Service: Orthopedics;  Laterality: Right;  . upper gi endoscopy     Social History   Occupational History  . Occupation: on disability since March 2009 d/t back pain  . 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 Smoking status: Former Smoker    Packs/day: 0.30    Years: 17.00    Pack years: 5.10    Types: Cigarettes  . Smokeless tobacco: Never Used  . Tobacco comment: quit smoking 2 1/2 yrs ago  Substance and Sexual Activity  . Alcohol use: Yes    Comment: rarely  . Drug use: No  . Sexual activity: Yes

## 2019-02-05 ENCOUNTER — Telehealth (INDEPENDENT_AMBULATORY_CARE_PROVIDER_SITE_OTHER): Payer: Self-pay | Admitting: *Deleted

## 2019-02-05 NOTE — Telephone Encounter (Signed)
Asked pt covid-19 pre screening questions and they answered no to all questions.

## 2019-02-06 ENCOUNTER — Encounter (INDEPENDENT_AMBULATORY_CARE_PROVIDER_SITE_OTHER): Payer: Self-pay | Admitting: Orthopedic Surgery

## 2019-02-06 ENCOUNTER — Other Ambulatory Visit: Payer: Self-pay

## 2019-02-06 ENCOUNTER — Ambulatory Visit (INDEPENDENT_AMBULATORY_CARE_PROVIDER_SITE_OTHER): Payer: Medicare Other | Admitting: Orthopedic Surgery

## 2019-02-06 DIAGNOSIS — Z96651 Presence of right artificial knee joint: Secondary | ICD-10-CM

## 2019-02-06 NOTE — Progress Notes (Signed)
Post-Op Visit Note   Patient: [name]           Date of Birth: 10-Sep-1961           MRN: [id number] Visit Date: 02/06/2019 PCP: Maylon Peppers, MD   Assessment & Plan:  Chief Complaint:  Chief Complaint  Patient presents with  . Right Knee - Follow-up   Visit Diagnoses: No diagnosis found.  Plan: [name] is now 5 weeks out right total knee replacement.  She has been doing well.  Still having some swelling.  Reports some muscle spasm but she is taking Ativan for that.  On exam she is lacking about 7 or 8 degrees of full extension which I counseled her on working through.  She flexes easily to 90.  Continue with therapy for another 6 weeks and I will see her back then for final check.  Follow-Up Instructions: Return in about 6 weeks (around 03/20/2019).   Orders:  No orders of the defined types were placed in this encounter.  No orders of the defined types were placed in this encounter.   Imaging: No results found.  PMFS History: Patient Active Problem List   Diagnosis Date Noted  . Arthritis of knee 12/25/2018  . Chronic pain of right knee 06/06/2018  . Pain of right heel 06/06/2018  . 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 Chest pain 02/02/2012  . Leukocytosis 02/02/2012  . Barrett's esophagus 04/28/2011  . FATTY LIVER DISEASE 05/24/2010  . SMOKER 09/08/2009  . DEPRESSION 04/02/2008  . DIABETES MELLITUS 12/25/2007  . HYPERLIPIDEMIA 12/25/2007  . OBESITY 12/25/2007  . HYPERTENSION 12/25/2007  . INTERSTITIAL CYSTITIS 12/25/2007  . [place] DISEASE 12/25/2007   Past Medical History:  Diagnosis Date  . Anxiety   . Barrett esophagus    Dr. Olevia Perches in GI  . Cancer ([place])    Skin Cancer  . Chronic back pain    stenosis and DDD  . DDD (degenerative disc disease)    s/p back surgeries and steroid injxns  . Depression    takes Fluoxetine daily  . Diabetes mellitus    Victoza daily  . GERD (gastroesophageal reflux disease)    takes Nexium daily   . History of kidney stones   . Hyperlipidemia    hasn't been on Pravastatin in 6-7wks   . Hypertension    takes Amlodipine and HCTZ daily  . Interstitial cystitis   . Joint pain   . Joint swelling   . Muscle spasm    takes Ativan nightly as needed  . Numbness    weakness in right leg  . Pneumonia    fall 2014  . PONV (postoperative nausea and vomiting)   . Spinal headache    blood patch required   . Urinary frequency     Family History  Problem Relation Age of Onset  . Hypertension Father   . Colon polyps Father   . Heart disease Mother        ischemic  . Gallbladder disease Mother   . Other Mother        rectovaginal fistula  . Allergies Sister   . Asthma Sister     Past Surgical History:  Procedure Laterality Date  . adhesions removed from abdomen    . APPENDECTOMY    . BACK SURGERY  12/2007   x 6-fusion x 2  . CESAREAN SECTION  1988/1989  . CHOLECYSTECTOMY    . COLONOSCOPY  2011  . epidural injections    .  KNEE ARTHROSCOPY Bilateral   . LAPAROSCOPIC LYSIS OF ADHESIONS N/A 04/15/2014   Procedure: LAPAROSCOPIC LYSIS OF ADHESION;  Surgeon: Joyice Faster. Cornett, MD;  Location: [place];  Service: General;  Laterality: N/A;  . LAPAROSCOPY N/A 04/15/2014   Procedure: LAPAROSCOPY DIAGNOSTIC;  Surgeon: Joyice Faster. Cornett, MD;  Location: [place];  Service: General;  Laterality: N/A;  . LEFT HEART CATHETERIZATION WITH CORONARY ANGIOGRAM N/A 02/03/2012   Procedure: LEFT HEART CATHETERIZATION WITH CORONARY ANGIOGRAM;  Surgeon: Troy Sine, MD;  Location: [facility] CATH LAB;  Service: Cardiovascular;  Laterality: N/A;  . LUMBAR FUSION Right 03/24/2015   Procedure: SI joint fusion - right ;  Surgeon: Karie Chimera, MD;  Location: [place] [facility];  Service: Neurosurgery;  Laterality: Right;  SI joint fusion - right   . NECK SURGERY  12/2007  . OOPHORECTOMY    . TOTAL ABDOMINAL HYSTERECTOMY    . TOTAL KNEE ARTHROPLASTY Right 12/25/2018  . TOTAL KNEE ARTHROPLASTY Right 12/25/2018   Procedure: RIGHT  TOTAL KNEE ARTHROPLASTY;  Surgeon: Meredith Pel, MD;  Location: [place];  Service: Orthopedics;  Laterality: Right;  . upper gi endoscopy     Social History   Occupational History  . Occupation: on disability since March 2009 d/t back pain  . 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 Smoking status: Former Smoker    Packs/day: 0.30    Years: 17.00    Pack years: 5.10    Types: Cigarettes  . Smokeless tobacco: Never Used  . Tobacco comment: quit smoking 2 1/2 yrs ago  Substance and Sexual Activity  . Alcohol use: Yes    Comment: rarely  . Drug use: No  . Sexual activity: Yes

## 2019-03-20 ENCOUNTER — Ambulatory Visit (INDEPENDENT_AMBULATORY_CARE_PROVIDER_SITE_OTHER): Payer: Medicare Other | Admitting: Orthopedic Surgery

## 2019-03-20 ENCOUNTER — Encounter: Payer: Self-pay | Admitting: Orthopedic Surgery

## 2019-03-20 ENCOUNTER — Other Ambulatory Visit: Payer: Self-pay

## 2019-03-20 DIAGNOSIS — Z96651 Presence of right artificial knee joint: Secondary | ICD-10-CM

## 2019-03-20 NOTE — Progress Notes (Signed)
Post-Op Visit Note   Patient: [name]           Date of Birth: Nov 22, 1960           MRN: [id number] Visit Date: 03/20/2019 PCP: Maylon Peppers, MD   Assessment & Plan:  Chief Complaint:  Chief Complaint  Patient presents with  . Right Knee - Follow-up   Visit Diagnoses:  1. S/P total knee arthroplasty, right     Plan: [name] is a patient is now about 3 months out right total knee replacement.  She is doing well with that.  Low concern about the amount of swelling that she has.  On exam she has excellent range of motion.  Lacking about 6 or 7 degrees of full extension compared to the left-hand side.  Flexion easily past 90.  I am going to release her at this time to home exercise program.  Antibiotic prophylaxis before dental work will be needed for 2 years.  Follow-up with me as needed.  Patient is happy with how her right knee feels.  She is walking about 2 miles a day.  Follow-Up Instructions: Return if symptoms worsen or fail to improve.   Orders:  No orders of the defined types were placed in this encounter.  No orders of the defined types were placed in this encounter.   Imaging: No results found.  PMFS History: Patient Active Problem List   Diagnosis Date Noted  . Arthritis of knee 12/25/2018  . Chronic pain of right knee 06/06/2018  . Pain of right heel 06/06/2018  . 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 Chest pain 02/02/2012  . Leukocytosis 02/02/2012  . Barrett's esophagus 04/28/2011  . FATTY LIVER DISEASE 05/24/2010  . SMOKER 09/08/2009  . DEPRESSION 04/02/2008  . DIABETES MELLITUS 12/25/2007  . HYPERLIPIDEMIA 12/25/2007  . OBESITY 12/25/2007  . HYPERTENSION 12/25/2007  . INTERSTITIAL CYSTITIS 12/25/2007  . [place] DISEASE 12/25/2007   Past Medical History:  Diagnosis Date  . Anxiety   . Barrett esophagus    Dr. Olevia Perches in GI  . Cancer ([place])    Skin Cancer  . Chronic back pain    stenosis and DDD  . DDD (degenerative disc  disease)    s/p back surgeries and steroid injxns  . Depression    takes Fluoxetine daily  . Diabetes mellitus    Victoza daily  . GERD (gastroesophageal reflux disease)    takes Nexium daily  . History of kidney stones   . Hyperlipidemia    hasn't been on Pravastatin in 6-7wks   . Hypertension    takes Amlodipine and HCTZ daily  . Interstitial cystitis   . Joint pain   . Joint swelling   . Muscle spasm    takes Ativan nightly as needed  . Numbness    weakness in right leg  . Pneumonia    fall 2014  . PONV (postoperative nausea and vomiting)   . Spinal headache    blood patch required   . Urinary frequency     Family History  Problem Relation Age of Onset  . Hypertension Father   . Colon polyps Father   . Heart disease Mother        ischemic  . Gallbladder disease Mother   . Other Mother        rectovaginal fistula  . Allergies Sister   . Asthma Sister     Past Surgical History:  Procedure Laterality Date  . adhesions removed from abdomen    .  APPENDECTOMY    . BACK SURGERY  12/2007   x 6-fusion x 2  . CESAREAN SECTION  1988/1989  . CHOLECYSTECTOMY    . COLONOSCOPY  2011  . epidural injections    . KNEE ARTHROSCOPY Bilateral   . LAPAROSCOPIC LYSIS OF ADHESIONS N/A 04/15/2014   Procedure: LAPAROSCOPIC LYSIS OF ADHESION;  Surgeon: Joyice Faster. Cornett, MD;  Location: [place];  Service: General;  Laterality: N/A;  . LAPAROSCOPY N/A 04/15/2014   Procedure: LAPAROSCOPY DIAGNOSTIC;  Surgeon: Joyice Faster. Cornett, MD;  Location: [place];  Service: General;  Laterality: N/A;  . LEFT HEART CATHETERIZATION WITH CORONARY ANGIOGRAM N/A 02/03/2012   Procedure: LEFT HEART CATHETERIZATION WITH CORONARY ANGIOGRAM;  Surgeon: Troy Sine, MD;  Location: [facility] C[facility] LAB;  Service: Cardiovascular;  Laterality: N/A;  . LUMBAR FUSION Right 03/24/2015   Procedure: SI joint fusion - right ;  Surgeon: Karie Chimera, MD;  Location: [place] NEURO [facility]S;  Service: Neurosurgery;  Laterality: Right;  SI joint  fusion - right   . NECK SURGERY  12/2007  . OOPHORECTOMY    . TOTAL ABDOMINAL HYSTERECTOMY    . TOTAL KNEE ARTHROPLASTY Right 12/25/2018  . TOTAL KNEE ARTHROPLASTY Right 12/25/2018   Procedure: RIGHT TOTAL KNEE ARTHROPLASTY;  Surgeon: Meredith Pel, MD;  Location: [place];  Service: Orthopedics;  Laterality: Right;  . upper gi endoscopy     Social History   Occupational History  . Occupation: on disability since March 2009 d/t back pain  . 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 Smoking status: Former Smoker    Packs/day: 0.30    Years: 17.00    Pack years: 5.10    Types: Cigarettes  . Smokeless tobacco: Never Used  . Tobacco comment: quit smoking 2 1/2 yrs ago  Substance and Sexual Activity  . Alcohol use: Yes    Comment: rarely  . Drug use: No  . Sexual activity: Yes

## 2019-03-30 ENCOUNTER — Telehealth: Payer: Self-pay | Admitting: Internal Medicine

## 2019-03-30 ENCOUNTER — Other Ambulatory Visit: Payer: Medicare Other

## 2019-03-30 DIAGNOSIS — Z20822 Contact with and (suspected) exposure to covid-19: Secondary | ICD-10-CM

## 2019-03-30 NOTE — Telephone Encounter (Signed)
  Spoke with the patient about possible Covid-19 exposure at 03/20/19 office visit to [facility]. RN explained the free Covid-19 screening offered for this exposure. Notified that she and her driver should wear a masks and the drive-up screening will take place at the old [facility] on [facility] [address]. Patient coming to [facility] 03/30/19 at 1530.  Pt's husband has Grade 4 COPD.

## 2019-04-01 LAB — NOVEL CORONAVIRUS, NAA: SARS-CoV-2, NAA: NOT DETECTED

## 2019-07-24 ENCOUNTER — Encounter: Payer: Self-pay | Admitting: Orthopedic Surgery

## 2019-07-24 ENCOUNTER — Ambulatory Visit (INDEPENDENT_AMBULATORY_CARE_PROVIDER_SITE_OTHER): Payer: Medicare Other | Admitting: Orthopedic Surgery

## 2019-07-24 DIAGNOSIS — Z96651 Presence of right artificial knee joint: Secondary | ICD-10-CM

## 2019-07-24 NOTE — Progress Notes (Addendum)
Post-Op Visit Note   Patient: [name]           Date of Birth: 10/01/1961           MRN: [id number] Visit Date: 07/24/2019 PCP: Maylon Peppers, MD   Assessment & Plan:  Chief Complaint:  Chief Complaint  Patient presents with  . Right Knee - Follow-up   Visit Diagnoses: No diagnosis found.  Plan: Pt is a 58 y.o. Female s/p Right TKA on 12/25/18.  Pt states that her pain and function are great compared to prior to surgery.  However, she notes daily episodes of instability with some catching sensations when she lays on her side.  She is walking 3-4 miles per day and notes increased swelling at the end of the day.  She is able to ascend/descend stairs but takes them slowly due to fear of falling.  Denies fevers/chills/night sweats or any difficulty with weightbearing.  Takes occasional Aleve for her pain.  No significant effusion on exam.  Prosthesis is stable in extension and flexion on exam.  Mild to moderate tenderness to palpation over the hamstring tendons posteriorly.  Reassured patient that maximum medical improvement from this procedure can take as long as 12 months.  Recommended she continue walking with home exercise program and follow-up as needed if her symptoms persist or worsen by the end of next winter.  Patient agreed with plan and will follow-up PRN.  Follow-Up Instructions: No follow-ups on file.   Orders:  No orders of the defined types were placed in this encounter.  No orders of the defined types were placed in this encounter.   Imaging: No results found.  PMFS History: Patient Active Problem List   Diagnosis Date Noted  . Arthritis of knee 12/25/2018  . Chronic pain of right knee 06/06/2018  . Pain of right heel 06/06/2018  . 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 Chest pain 02/02/2012  . Leukocytosis 02/02/2012  . Barrett's esophagus 04/28/2011  . FATTY LIVER DISEASE 05/24/2010  . SMOKER 09/08/2009  . DEPRESSION 04/02/2008  . DIABETES MELLITUS  12/25/2007  . HYPERLIPIDEMIA 12/25/2007  . OBESITY 12/25/2007  . HYPERTENSION 12/25/2007  . INTERSTITIAL CYSTITIS 12/25/2007  . [place] DISEASE 12/25/2007   Past Medical History:  Diagnosis Date  . Anxiety   . Barrett esophagus    Dr. Olevia Perches in GI  . Cancer ([place])    Skin Cancer  . Chronic back pain    stenosis and DDD  . DDD (degenerative disc disease)    s/p back surgeries and steroid injxns  . Depression    takes Fluoxetine daily  . Diabetes mellitus    Victoza daily  . GERD (gastroesophageal reflux disease)    takes Nexium daily  . History of kidney stones   . Hyperlipidemia    hasn't been on Pravastatin in 6-7wks   . Hypertension    takes Amlodipine and HCTZ daily  . Interstitial cystitis   . Joint pain   . Joint swelling   . Muscle spasm    takes Ativan nightly as needed  . Numbness    weakness in right leg  . Pneumonia    fall 2014  . PONV (postoperative nausea and vomiting)   . Spinal headache    blood patch required   . Urinary frequency     Family History  Problem Relation Age of Onset  . Hypertension Father   . Colon polyps Father   . Heart disease Mother  ischemic  . Gallbladder disease Mother   . Other Mother        rectovaginal fistula  . Allergies Sister   . Asthma Sister     Past Surgical History:  Procedure Laterality Date  . adhesions removed from abdomen    . APPENDECTOMY    . BACK SURGERY  12/2007   x 6-fusion x 2  . CESAREAN SECTION  1988/1989  . CHOLECYSTECTOMY    . COLONOSCOPY  2011  . epidural injections    . KNEE ARTHROSCOPY Bilateral   . LAPAROSCOPIC LYSIS OF ADHESIONS N/A 04/15/2014   Procedure: LAPAROSCOPIC LYSIS OF ADHESION;  Surgeon: Joyice Faster. Cornett, MD;  Location: [place];  Service: General;  Laterality: N/A;  . LAPAROSCOPY N/A 04/15/2014   Procedure: LAPAROSCOPY DIAGNOSTIC;  Surgeon: Joyice Faster. Cornett, MD;  Location: [place];  Service: General;  Laterality: N/A;  . LEFT HEART CATHETERIZATION WITH CORONARY  ANGIOGRAM N/A 02/03/2012   Procedure: LEFT HEART CATHETERIZATION WITH CORONARY ANGIOGRAM;  Surgeon: Troy Sine, MD;  Location[facility] CATH LAB;  Service: Cardiovascular;  Laterality: N/A;  . LUMBAR FUSION Right 03/24/2015   Procedure: SI joint fusion - right ;  Surgeon: Karie Chimera, MD;  Location: [place] NEURO ORS;  Service: Neurosurgery;  Laterality: Right;  SI joint fusion - right   . NECK SURGERY  12/2007  . OOPHORECTOMY    . TOTAL ABDOMINAL HYSTERECTOMY    . TOTAL KNEE ARTHROPLASTY Right 12/25/2018  . TOTAL KNEE ARTHROPLASTY Right 12/25/2018   Procedure: RIGHT TOTAL KNEE ARTHROPLASTY;  Surgeon: Meredith Pel, MD;  Location: [place];  Service: Orthopedics;  Laterality: Right;  . upper gi endoscopy     Social History   Occupational History  . Occupation: on disability since March 2009 d/t back pain  . 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 Smoking status: Former Smoker    Packs/day: 0.30    Years: 17.00    Pack years: 5.10    Types: Cigarettes  . Smokeless tobacco: Never Used  . Tobacco comment: quit smoking 2 1/2 yrs ago  Substance and Sexual Activity  . Alcohol use: Yes    Comment: rarely  . Drug use: No  . Sexual activity: Yes

## 2019-07-27 ENCOUNTER — Encounter: Payer: Self-pay | Admitting: Orthopedic Surgery

## 2020-11-13 ENCOUNTER — Other Ambulatory Visit: Payer: Self-pay | Admitting: Neurosurgery

## 2020-11-27 NOTE — Progress Notes (Signed)
[place], [place] - [address] [place] [address] Phone: [phone] Fax: [phone]      Your procedure is scheduled on Tuesday, December 08, 2020.  Report to [facility] Main Entrance "A" at 06:00 A.M., and check in at the Admitting office.  Call this number if you have problems the morning of surgery:  [phone]  Call [phone] if you have any questions prior to your surgery date Monday-Friday 8am-4pm    Remember:  Do not eat or drink after midnight the night before your surgery.    Take these medicines the morning of surgery with A SIP OF WATER: Amlodipine (Norvasc) Duloxetine (Cymbalta) Ezetimibe (Zetia) Fluoxetine (Prozac) Pantoprazole (Protonix)  Use the following medications as needed the morning of surgery: Acetaminophen (Tylenol) Hydrocodone-acetaminophen (Norco/Vicodin)   As of today, STOP taking any Aspirin (unless otherwise instructed by your surgeon) Aleve, Naproxen, Ibuprofen, Motrin, Advil, Goody's, BC's, all herbal medications, fish oil, and all vitamins.   WHAT DO I DO ABOUT MY DIABETES MEDICATION?  . HOLD Empagliflozin (Jardiance) the day before surgery and the day of surgery.  . THE MORNING OF SURGERY, take 25 units of insulin degludec Tyler Aas).  . THE DAY BEFORE SURGERY, take your usual doses of Insulin lispro (Humalog) before meals but do not take a bedtime dose.  . THE MORNING OF SURGERY, if your CBG is greater than 220 mg/dL, you may take  of your sliding scale (correction) dose of insulin.   HOW TO MANAGE YOUR DIABETES BEFORE AND AFTER SURGERY  Why is it important to control my blood sugar before and after surgery? . Improving blood sugar levels before and after surgery helps healing and can limit problems. . A way of improving blood sugar control is eating a healthy diet by: o  Eating less sugar and carbohydrates o  Increasing activity/exercise o  Talking with your  doctor about reaching your blood sugar goals . High blood sugars (greater than 180 mg/dL) can raise your risk of infections and slow your recovery, so you will need to focus on controlling your diabetes during the weeks before surgery. . Make sure that the doctor who takes care of your diabetes knows about your planned surgery including the date and location.  How do I manage my blood sugar before surgery? . Check your blood sugar at least 4 times a day, starting 2 days before surgery, to make sure that the level is not too high or low. . Check your blood sugar the morning of your surgery when you wake up and every 2 hours until you get to the Short Stay unit. o If your blood sugar is less than 70 mg/dL, you will need to treat for low blood sugar: - Do not take insulin. - Treat a low blood sugar (less than 70 mg/dL) with  cup of clear juice (cranberry or apple), 4 glucose tablets, OR glucose gel. - Recheck blood sugar in 15 minutes after treatment (to make sure it is greater than 70 mg/dL). If your blood sugar is not greater than 70 mg/dL on recheck, call [phone] for further instructions. . Report your blood sugar to the short stay nurse when you get to Short Stay.  . If you are admitted to the hospital after surgery: o Your blood sugar will be checked by the staff and you will probably be given insulin after surgery (instead of oral diabetes medicines) to make sure you have good blood sugar levels. o The goal for blood  sugar control after surgery is 80-180 mg/dL.                      Do not wear jewelry, make up, or nail polish            Do not wear lotions, powders, perfumes/colognes, or deodorant.            Do not shave 48 hours prior to surgery.  Men may shave face and neck.            Do not bring valuables to the hospital.            [facility] is not responsible for any belongings or valuables.  Do NOT Smoke (Tobacco/Vaping) or drink Alcohol 24 hours prior to your  procedure  If you use a CPAP at night, you may bring all equipment for your overnight stay.   Contacts, glasses, hearing aids, dentures or bridgework may not be worn into surgery.      For patients admitted to the hospital, discharge time will be determined by your treatment team.   Patients discharged the day of surgery will not be allowed to drive home, and someone needs to stay with them for 24 hours.    Special instructions:   [place]- Preparing For Surgery  Before surgery, you can play an important role. Because skin is not sterile, your skin needs to be as free of germs as possible. You can reduce the number of germs on your skin by washing with CHG (chlorahexidine gluconate) Soap before surgery.  CHG is an antiseptic cleaner which kills germs and bonds with the skin to continue killing germs even after washing.    Oral Hygiene is also important to reduce your risk of infection.  Remember - BRUSH YOUR TEETH THE MORNING OF SURGERY WITH YOUR REGULAR TOOTHPASTE  Please do not use if you have an allergy to CHG or antibacterial soaps. If your skin becomes reddened/irritated stop using the CHG.  Do not shave (including legs and underarms) for at least 48 hours prior to first CHG shower. It is OK to shave your face.  Please follow these instructions carefully.   1. Shower the NIGHT BEFORE SURGERY and the MORNING OF SURGERY with CHG Soap.   2. If you chose to wash your hair, wash your hair first as usual with your normal shampoo.  3. After you shampoo, rinse your hair and body thoroughly to remove the shampoo.  4. Use CHG as you would any other liquid soap. You can apply CHG directly to the skin and wash gently with a scrungie or a clean washcloth.   5. Apply the CHG Soap to your body ONLY FROM THE NECK DOWN.  Do not use on open wounds or open sores. Avoid contact with your eyes, ears, mouth and genitals (private parts). Wash Face and genitals (private parts)  with your normal soap.    6. Wash thoroughly, paying special attention to the area where your surgery will be performed.  7. Thoroughly rinse your body with warm water from the neck down.  8. DO NOT shower/wash with your normal soap after using and rinsing off the CHG Soap.  9. Pat yourself dry with a CLEAN TOWEL.  10. Wear CLEAN PAJAMAS to bed the night before surgery  11. Place CLEAN SHEETS on your bed the night of your first shower and DO NOT SLEEP WITH PETS.   Day of Surgery: Shower with CHG soap as directed Wear  Clean/Comfortable clothing the morning of surgery Do not apply any deodorants/lotions.   Remember to brush your teeth WITH YOUR REGULAR TOOTHPASTE.   Please read over the following fact sheets that you were given.

## 2020-11-30 ENCOUNTER — Encounter (HOSPITAL_COMMUNITY): Payer: Self-pay

## 2020-11-30 ENCOUNTER — Other Ambulatory Visit: Payer: Self-pay

## 2020-11-30 ENCOUNTER — Encounter (HOSPITAL_COMMUNITY)
Admission: RE | Admit: 2020-11-30 | Discharge: 2020-11-30 | Disposition: A | Discharge status: Home / Self Care | Payer: Medicare Other | Source: Ambulatory Visit | Attending: Neurosurgery | Admitting: Neurosurgery

## 2020-11-30 DIAGNOSIS — I10 Essential (primary) hypertension: Secondary | ICD-10-CM | POA: Diagnosis not present

## 2020-11-30 DIAGNOSIS — E118 Type 2 diabetes mellitus with unspecified complications: Secondary | ICD-10-CM | POA: Diagnosis not present

## 2020-11-30 DIAGNOSIS — Z01818 Encounter for other preprocedural examination: Secondary | ICD-10-CM | POA: Diagnosis present

## 2020-11-30 LAB — CBC WITH DIFFERENTIAL/PLATELET
Abs Immature Granulocytes: 0.05 10*3/uL (ref 0.00–0.07)
Basophils Absolute: 0.1 10*3/uL (ref 0.0–0.1)
Basophils Relative: 1 %
Eosinophils Absolute: 0.1 10*3/uL (ref 0.0–0.5)
Eosinophils Relative: 1 %
HCT: 42.9 % (ref 36.0–46.0)
Hemoglobin: 14.4 g/dL (ref 12.0–15.0)
Immature Granulocytes: 1 %
Lymphocytes Relative: 22 %
Lymphs Abs: 2.4 10*3/uL (ref 0.7–4.0)
MCH: 30.6 pg (ref 26.0–34.0)
MCHC: 33.6 g/dL (ref 30.0–36.0)
MCV: 91.1 fL (ref 80.0–100.0)
Monocytes Absolute: 0.5 10*3/uL (ref 0.1–1.0)
Monocytes Relative: 4 %
Neutro Abs: 7.7 10*3/uL (ref 1.7–7.7)
Neutrophils Relative %: 71 %
Platelets: 400 10*3/uL (ref 150–400)
RBC: 4.71 MIL/uL (ref 3.87–5.11)
RDW: 12.3 % (ref 11.5–15.5)
WBC: 10.8 10*3/uL — ABNORMAL HIGH (ref 4.0–10.5)
nRBC: 0 % (ref 0.0–0.2)

## 2020-11-30 LAB — GLUCOSE, CAPILLARY: Glucose-Capillary: 160 mg/dL — ABNORMAL HIGH (ref 70–99)

## 2020-11-30 LAB — TYPE AND SCREEN
ABO/RH(D): O POS
Antibody Screen: NEGATIVE

## 2020-11-30 LAB — SURGICAL PCR SCREEN
MRSA, PCR: NEGATIVE
Staphylococcus aureus: NEGATIVE

## 2020-11-30 NOTE — Progress Notes (Signed)
PCP - Brand Males, PA-C Cardiologist - denies   Chest x-ray - N/A EKG - 11/30/20 Stress Test - 2009 @ [facility] E[facility] - denies Cardiac Cath - 02/03/12  Sleep Study - denies  Fasting Blood Sugar - 120-140 Wears continuous CBG monitor  Aspirin Instructions: Patient instructed to hold all Aspirin, NSAID's, herbal medications, fish oil and vitamins 7 days prior to surgery.   ERAS Protcol - N/A PRE-SURGERY Ensure or G2- N/A  COVID TEST- 12/05/20 at at [facility]. Pt instructed to remain in their car. Educated on Transport planner until [employer].    Anesthesia review: cardiac history  Patient denies shortness of breath, fever, cough and chest pain at PAT appointment   All instructions explained to the patient, with a verbal understanding of the material. Patient agrees to go over the instructions while at home for a better understanding. Patient also instructed to self quarantine after being tested for COVID-19. The opportunity to ask questions was provided.

## 2020-12-05 ENCOUNTER — Other Ambulatory Visit (HOSPITAL_COMMUNITY)
Admission: RE | Admit: 2020-12-05 | Discharge: 2020-12-05 | Disposition: A | Discharge status: Home / Self Care | Payer: Medicare Other | Source: Ambulatory Visit | Attending: Neurosurgery | Admitting: Neurosurgery

## 2020-12-05 DIAGNOSIS — Z20822 Contact with and (suspected) exposure to covid-19: Secondary | ICD-10-CM | POA: Diagnosis not present

## 2020-12-05 DIAGNOSIS — Z01812 Encounter for preprocedural laboratory examination: Secondary | ICD-10-CM | POA: Insufficient documentation

## 2020-12-05 LAB — SARS CORONAVIRUS 2 (TAT 6-24 HRS): SARS Coronavirus 2: NEGATIVE

## 2020-12-07 NOTE — Anesthesia Preprocedure Evaluation (Addendum)
Anesthesia Evaluation  Patient identified by MRN, date of birth, ID band Patient awake    Reviewed: Allergy & Precautions, NPO status , Patient's Chart, lab work & pertinent test results  History of Anesthesia Complications (+) PONV, POST - OP SPINAL HEADACHE and history of anesthetic complications  Airway Mallampati: II  TM Distance: >3 FB Neck ROM: Full    Dental  (+) Teeth Intact, Dental Advisory Given   Pulmonary former smoker,    Pulmonary exam normal breath sounds clear to auscultation       Cardiovascular hypertension, Pt. on medications Normal cardiovascular exam Rhythm:Regular Rate:Normal     Neuro/Psych  Headaches, PSYCHIATRIC DISORDERS Anxiety Depression    GI/Hepatic Neg liver ROS, GERD  Medicated,  Endo/Other  diabetes, Type 2, Oral Hypoglycemic Agents, Insulin DependentObesity   Renal/GU negative Renal ROS Bladder dysfunction (IC)      Musculoskeletal  (+) Arthritis ,   Abdominal   Peds  Hematology negative hematology ROS (+)   Anesthesia Other Findings   Reproductive/Obstetrics                            Anesthesia Physical Anesthesia Plan  ASA: III  Anesthesia Plan: General   Post-op Pain Management:    Induction: Intravenous  PONV Risk Score and Plan: 4 or greater and Scopolamine patch - Pre-op, Midazolam, Dexamethasone, Ondansetron, TIVA and Diphenhydramine  Airway Management Planned: Oral ETT  Additional Equipment:   Intra-op Plan:   Post-operative Plan: Extubation in OR  Informed Consent: I have reviewed the patients History and Physical, chart, labs and discussed the procedure including the risks, benefits and alternatives for the proposed anesthesia with the patient or authorized representative who has indicated his/her understanding and acceptance.     Dental advisory given  Plan Discussed with: CRNA  Anesthesia Plan Comments: (2nd PIV for  TIVA)       Anesthesia Quick Evaluation

## 2020-12-08 ENCOUNTER — Ambulatory Visit (HOSPITAL_COMMUNITY): Payer: Medicare Other

## 2020-12-08 ENCOUNTER — Ambulatory Visit (HOSPITAL_COMMUNITY): Payer: Medicare Other | Admitting: Physician Assistant

## 2020-12-08 ENCOUNTER — Other Ambulatory Visit: Payer: Self-pay

## 2020-12-08 ENCOUNTER — Encounter (HOSPITAL_COMMUNITY): Payer: Self-pay | Admitting: Neurosurgery

## 2020-12-08 ENCOUNTER — Observation Stay (HOSPITAL_COMMUNITY)
Admission: RE | Admit: 2020-12-08 | Discharge: 2020-12-09 | Disposition: A | Discharge status: Home / Self Care | Payer: Medicare Other | Source: Ambulatory Visit | Attending: Neurosurgery | Admitting: Neurosurgery

## 2020-12-08 ENCOUNTER — Ambulatory Visit (HOSPITAL_COMMUNITY): Payer: Medicare Other | Admitting: Certified Registered Nurse Anesthetist

## 2020-12-08 ENCOUNTER — Encounter (HOSPITAL_COMMUNITY)
Admission: RE | Disposition: A | Discharge status: Home / Self Care | Payer: Self-pay | Source: Ambulatory Visit | Attending: Neurosurgery

## 2020-12-08 DIAGNOSIS — Z419 Encounter for procedure for purposes other than remedying health state, unspecified: Secondary | ICD-10-CM

## 2020-12-08 DIAGNOSIS — Z7984 Long term (current) use of oral hypoglycemic drugs: Secondary | ICD-10-CM | POA: Diagnosis not present

## 2020-12-08 DIAGNOSIS — Z79899 Other long term (current) drug therapy: Secondary | ICD-10-CM | POA: Diagnosis not present

## 2020-12-08 DIAGNOSIS — Z794 Long term (current) use of insulin: Secondary | ICD-10-CM | POA: Insufficient documentation

## 2020-12-08 DIAGNOSIS — E119 Type 2 diabetes mellitus without complications: Secondary | ICD-10-CM | POA: Diagnosis not present

## 2020-12-08 DIAGNOSIS — Z7982 Long term (current) use of aspirin: Secondary | ICD-10-CM | POA: Insufficient documentation

## 2020-12-08 DIAGNOSIS — Z87891 Personal history of nicotine dependence: Secondary | ICD-10-CM | POA: Insufficient documentation

## 2020-12-08 DIAGNOSIS — I10 Essential (primary) hypertension: Secondary | ICD-10-CM | POA: Diagnosis not present

## 2020-12-08 DIAGNOSIS — C4490 Unspecified malignant neoplasm of skin, unspecified: Secondary | ICD-10-CM | POA: Insufficient documentation

## 2020-12-08 DIAGNOSIS — M4310 Spondylolisthesis, site unspecified: Secondary | ICD-10-CM | POA: Diagnosis present

## 2020-12-08 DIAGNOSIS — M5416 Radiculopathy, lumbar region: Secondary | ICD-10-CM | POA: Diagnosis not present

## 2020-12-08 DIAGNOSIS — M4316 Spondylolisthesis, lumbar region: Secondary | ICD-10-CM | POA: Diagnosis not present

## 2020-12-08 LAB — GLUCOSE, CAPILLARY
Glucose-Capillary: 146 mg/dL — ABNORMAL HIGH (ref 70–99)
Glucose-Capillary: 142 mg/dL — ABNORMAL HIGH (ref 70–99)
Glucose-Capillary: 273 mg/dL — ABNORMAL HIGH (ref 70–99)
Glucose-Capillary: 360 mg/dL — ABNORMAL HIGH (ref 70–99)

## 2020-12-08 LAB — HEMOGLOBIN A1C
Hgb A1c MFr Bld: 6.4 % — ABNORMAL HIGH (ref 4.8–5.6)
Mean Plasma Glucose: 136.98 mg/dL

## 2020-12-08 SURGERY — POSTERIOR LUMBAR FUSION 1 LEVEL
Anesthesia: General | Site: Back

## 2020-12-08 MED ORDER — MIDAZOLAM HCL 5 MG/5ML IJ SOLN
INTRAMUSCULAR | Status: DC | PRN
  Administered 2020-12-08: 2 mg via INTRAVENOUS

## 2020-12-08 MED ORDER — SUFENTANIL CITRATE 50 MCG/ML IV SOLN
INTRAVENOUS | Status: DC
  Filled 2020-12-08: qty 100

## 2020-12-08 MED ORDER — PROPOFOL 10 MG/ML IV BOLUS
INTRAVENOUS | Status: DC | PRN
  Administered 2020-12-08: 100 mg via INTRAVENOUS

## 2020-12-08 MED ORDER — PHENYLEPHRINE HCL-NACL 20-0.9 MG/250ML-% IV SOLN
INTRAVENOUS | Status: DC | PRN
  Administered 2020-12-08: 25 ug/min via INTRAVENOUS

## 2020-12-08 MED ORDER — DULOXETINE HCL 20 MG PO CPEP
20.0000 mg | ORAL_CAPSULE | Freq: Two times a day (BID) | ORAL | Status: DC
  Administered 2020-12-08: 20 mg via ORAL
  Filled 2020-12-08 (×2): qty 1

## 2020-12-08 MED ORDER — PANTOPRAZOLE SODIUM 40 MG PO TBEC
40.0000 mg | DELAYED_RELEASE_TABLET | Freq: Every day | ORAL | Status: DC
Start: 2020-12-09 — End: 2020-12-09

## 2020-12-08 MED ORDER — ROCURONIUM BROMIDE 10 MG/ML (PF) SYRINGE
PREFILLED_SYRINGE | INTRAVENOUS | Status: DC | PRN
  Administered 2020-12-08: 80 mg via INTRAVENOUS

## 2020-12-08 MED ORDER — SEMAGLUTIDE(0.25 OR 0.5MG/DOS) 2 MG/1.5ML ~~LOC~~ SOPN: 0.5000 mg | PEN_INJECTOR | SUBCUTANEOUS | Status: DC

## 2020-12-08 MED ORDER — VANCOMYCIN HCL IN DEXTROSE 750-5 MG/150ML-% IV SOLN
750.0000 mg | Freq: Once | INTRAVENOUS | Status: AC
  Administered 2020-12-08: 750 mg via INTRAVENOUS
  Filled 2020-12-08: qty 150

## 2020-12-08 MED ORDER — FENTANYL CITRATE (PF) 100 MCG/2ML IJ SOLN
INTRAMUSCULAR | Status: DC | PRN
  Administered 2020-12-08: 100 ug via INTRAVENOUS
  Administered 2020-12-08: 50 ug via INTRAVENOUS

## 2020-12-08 MED ORDER — ONDANSETRON HCL 4 MG/2ML IJ SOLN
INTRAMUSCULAR | Status: AC
  Filled 2020-12-08: qty 2

## 2020-12-08 MED ORDER — FENTANYL CITRATE (PF) 250 MCG/5ML IJ SOLN
INTRAMUSCULAR | Status: AC
  Filled 2020-12-08: qty 5

## 2020-12-08 MED ORDER — ROCURONIUM BROMIDE 10 MG/ML (PF) SYRINGE
PREFILLED_SYRINGE | INTRAVENOUS | Status: AC
  Filled 2020-12-08: qty 10

## 2020-12-08 MED ORDER — PROPOFOL 1000 MG/100ML IV EMUL
INTRAVENOUS | Status: AC
  Filled 2020-12-08: qty 100

## 2020-12-08 MED ORDER — FENTANYL CITRATE (PF) 100 MCG/2ML IJ SOLN
INTRAMUSCULAR | Status: AC
  Filled 2020-12-08: qty 2

## 2020-12-08 MED ORDER — MIDAZOLAM HCL 2 MG/2ML IJ SOLN
INTRAMUSCULAR | Status: AC
  Filled 2020-12-08: qty 2

## 2020-12-08 MED ORDER — OXYCODONE HCL 5 MG PO TABS
10.0000 mg | ORAL_TABLET | ORAL | Status: DC | PRN
  Administered 2020-12-08 (×2): 10 mg via ORAL
  Filled 2020-12-08 (×2): qty 2

## 2020-12-08 MED ORDER — BUPIVACAINE HCL (PF) 0.25 % IJ SOLN
INTRAMUSCULAR | Status: AC
  Filled 2020-12-08: qty 30

## 2020-12-08 MED ORDER — BUPIVACAINE HCL (PF) 0.25 % IJ SOLN
INTRAMUSCULAR | Status: DC | PRN
  Administered 2020-12-08: 20 mL

## 2020-12-08 MED ORDER — ONDANSETRON HCL 4 MG PO TABS: 4.0000 mg | ORAL_TABLET | Freq: Four times a day (QID) | ORAL | Status: DC | PRN

## 2020-12-08 MED ORDER — FENTANYL CITRATE (PF) 100 MCG/2ML IJ SOLN
25.0000 ug | INTRAMUSCULAR | Status: DC | PRN
  Administered 2020-12-08 (×5): 25 ug via INTRAVENOUS

## 2020-12-08 MED ORDER — ACETAMINOPHEN 650 MG RE SUPP
650.0000 mg | RECTAL | Status: DC | PRN
Start: 2020-12-08 — End: 2020-12-09

## 2020-12-08 MED ORDER — ARTIFICIAL TEARS OPHTHALMIC OINT
TOPICAL_OINTMENT | OPHTHALMIC | Status: AC
  Filled 2020-12-08: qty 3.5

## 2020-12-08 MED ORDER — MENTHOL 3 MG MT LOZG: 1.0000 | LOZENGE | OROMUCOSAL | Status: DC | PRN

## 2020-12-08 MED ORDER — DEXAMETHASONE SODIUM PHOSPHATE 10 MG/ML IJ SOLN
10.0000 mg | Freq: Once | INTRAMUSCULAR | Status: AC
  Administered 2020-12-08: 10 mg via INTRAVENOUS
  Filled 2020-12-08: qty 1

## 2020-12-08 MED ORDER — DIPHENHYDRAMINE HCL 50 MG/ML IJ SOLN
INTRAMUSCULAR | Status: DC | PRN
  Administered 2020-12-08: 25 mg via INTRAVENOUS

## 2020-12-08 MED ORDER — ASPIRIN 81 MG PO CHEW: 81.0000 mg | CHEWABLE_TABLET | Freq: Two times a day (BID) | ORAL | Status: DC

## 2020-12-08 MED ORDER — HYDROMORPHONE HCL 1 MG/ML IJ SOLN
INTRAMUSCULAR | Status: AC
  Filled 2020-12-08: qty 0.5

## 2020-12-08 MED ORDER — PROPOFOL 10 MG/ML IV BOLUS
INTRAVENOUS | Status: AC
  Filled 2020-12-08: qty 40

## 2020-12-08 MED ORDER — CHLORHEXIDINE GLUCONATE 0.12 % MT SOLN
15.0000 mL | Freq: Once | OROMUCOSAL | Status: AC
  Administered 2020-12-08: 15 mL via OROMUCOSAL
  Filled 2020-12-08: qty 15

## 2020-12-08 MED ORDER — THROMBIN 20000 UNITS EX SOLR
CUTANEOUS | Status: AC
  Filled 2020-12-08: qty 20000

## 2020-12-08 MED ORDER — HYDROCODONE-ACETAMINOPHEN 10-325 MG PO TABS
1.0000 | ORAL_TABLET | ORAL | Status: DC | PRN
  Administered 2020-12-09 (×2): 1 via ORAL
  Filled 2020-12-08 (×2): qty 1

## 2020-12-08 MED ORDER — CHLORHEXIDINE GLUCONATE CLOTH 2 % EX PADS: 6.0000 | MEDICATED_PAD | Freq: Once | CUTANEOUS | Status: DC

## 2020-12-08 MED ORDER — ORAL CARE MOUTH RINSE: 15.0000 mL | Freq: Once | OROMUCOSAL | Status: AC

## 2020-12-08 MED ORDER — BISACODYL 10 MG RE SUPP: 10.0000 mg | Freq: Every day | RECTAL | Status: DC | PRN

## 2020-12-08 MED ORDER — VANCOMYCIN HCL 1000 MG IV SOLR
INTRAVENOUS | Status: DC | PRN
  Administered 2020-12-08: 1000 mg

## 2020-12-08 MED ORDER — VANCOMYCIN HCL 1000 MG IV SOLR
INTRAVENOUS | Status: AC
  Filled 2020-12-08: qty 1000

## 2020-12-08 MED ORDER — THROMBIN 20000 UNITS EX SOLR
CUTANEOUS | Status: DC | PRN
  Administered 2020-12-08: 20 mL via TOPICAL

## 2020-12-08 MED ORDER — EZETIMIBE 10 MG PO TABS
10.0000 mg | ORAL_TABLET | Freq: Every day | ORAL | Status: DC
  Filled 2020-12-08: qty 1

## 2020-12-08 MED ORDER — LORAZEPAM 0.5 MG PO TABS
1.0000 mg | ORAL_TABLET | Freq: Every day | ORAL | Status: DC
  Administered 2020-12-08: 1 mg via ORAL
  Filled 2020-12-08: qty 2

## 2020-12-08 MED ORDER — LIDOCAINE 2% (20 MG/ML) 5 ML SYRINGE
INTRAMUSCULAR | Status: DC | PRN
  Administered 2020-12-08: 80 mg via INTRAVENOUS

## 2020-12-08 MED ORDER — PHENOL 1.4 % MT LIQD: 1.0000 | OROMUCOSAL | Status: DC | PRN

## 2020-12-08 MED ORDER — HYDROCHLOROTHIAZIDE 25 MG PO TABS: 25.0000 mg | ORAL_TABLET | Freq: Every day | ORAL | Status: DC

## 2020-12-08 MED ORDER — ONDANSETRON HCL 4 MG/2ML IJ SOLN
INTRAMUSCULAR | Status: DC | PRN
Start: 2020-12-08 — End: 2020-12-08
  Administered 2020-12-08: 4 mg via INTRAVENOUS

## 2020-12-08 MED ORDER — SCOPOLAMINE 1 MG/3DAYS TD PT72
1.0000 | MEDICATED_PATCH | Freq: Once | TRANSDERMAL | Status: DC
  Administered 2020-12-08: 1.5 mg via TRANSDERMAL
  Filled 2020-12-08: qty 1

## 2020-12-08 MED ORDER — DIAZEPAM 5 MG PO TABS
5.0000 mg | ORAL_TABLET | Freq: Four times a day (QID) | ORAL | Status: DC | PRN
Start: 2020-12-08 — End: 2020-12-09
  Administered 2020-12-08 – 2020-12-09 (×2): 5 mg via ORAL
  Administered 2020-12-09: 10 mg via ORAL
  Filled 2020-12-08 (×3): qty 1

## 2020-12-08 MED ORDER — 0.9 % SODIUM CHLORIDE (POUR BTL) OPTIME
TOPICAL | Status: DC | PRN
  Administered 2020-12-08: 1000 mL

## 2020-12-08 MED ORDER — FLEET ENEMA 7-19 GM/118ML RE ENEM: 1.0000 | ENEMA | Freq: Once | RECTAL | Status: DC | PRN

## 2020-12-08 MED ORDER — FLUOXETINE HCL 20 MG PO CAPS: 40.0000 mg | ORAL_CAPSULE | Freq: Every day | ORAL | Status: DC

## 2020-12-08 MED ORDER — PROPOFOL 1000 MG/100ML IV EMUL
INTRAVENOUS | Status: AC
  Filled 2020-12-08: qty 200

## 2020-12-08 MED ORDER — SUFENTANIL CITRATE 50 MCG/ML IV SOLN
INTRAVENOUS | Status: AC
  Filled 2020-12-08: qty 1

## 2020-12-08 MED ORDER — PROMETHAZINE HCL 25 MG/ML IJ SOLN
6.2500 mg | INTRAMUSCULAR | Status: DC | PRN
Start: 2020-12-08 — End: 2020-12-08

## 2020-12-08 MED ORDER — EMPAGLIFLOZIN 25 MG PO TABS
25.0000 mg | ORAL_TABLET | Freq: Every day | ORAL | Status: DC
  Filled 2020-12-08: qty 1

## 2020-12-08 MED ORDER — POTASSIUM 99 MG PO TABS: 99.0000 mg | ORAL_TABLET | Freq: Every day | ORAL | Status: DC

## 2020-12-08 MED ORDER — HYDROMORPHONE HCL 1 MG/ML IJ SOLN
INTRAMUSCULAR | Status: DC | PRN
  Administered 2020-12-08: .5 mg via INTRAVENOUS

## 2020-12-08 MED ORDER — ONDANSETRON HCL 4 MG/2ML IJ SOLN: 4.0000 mg | Freq: Four times a day (QID) | INTRAMUSCULAR | Status: DC | PRN

## 2020-12-08 MED ORDER — POLYETHYLENE GLYCOL 3350 17 G PO PACK: 17.0000 g | PACK | Freq: Every day | ORAL | Status: DC | PRN

## 2020-12-08 MED ORDER — SUGAMMADEX SODIUM 200 MG/2ML IV SOLN
INTRAVENOUS | Status: DC | PRN
  Administered 2020-12-08: 200 mg via INTRAVENOUS

## 2020-12-08 MED ORDER — SODIUM CHLORIDE 0.9% FLUSH
3.0000 mL | Freq: Two times a day (BID) | INTRAVENOUS | Status: DC
  Administered 2020-12-08: 3 mL via INTRAVENOUS

## 2020-12-08 MED ORDER — INSULIN GLARGINE 100 UNIT/ML ~~LOC~~ SOLN
50.0000 [IU] | Freq: Every day | SUBCUTANEOUS | Status: DC
  Filled 2020-12-08: qty 0.5

## 2020-12-08 MED ORDER — INSULIN ASPART 100 UNIT/ML ~~LOC~~ SOLN
0.0000 [IU] | Freq: Three times a day (TID) | SUBCUTANEOUS | Status: DC
  Administered 2020-12-09: 3 [IU] via SUBCUTANEOUS

## 2020-12-08 MED ORDER — INSULIN ASPART 100 UNIT/ML ~~LOC~~ SOLN
0.0000 [IU] | Freq: Every day | SUBCUTANEOUS | Status: DC
  Administered 2020-12-08: 5 [IU] via SUBCUTANEOUS

## 2020-12-08 MED ORDER — LACTATED RINGERS IV SOLN: INTRAVENOUS | Status: DC

## 2020-12-08 MED ORDER — HYDROMORPHONE HCL 1 MG/ML IJ SOLN
1.0000 mg | INTRAMUSCULAR | Status: DC | PRN
  Administered 2020-12-08: 1 mg via INTRAVENOUS
  Filled 2020-12-08: qty 1

## 2020-12-08 MED ORDER — AMLODIPINE BESYLATE 5 MG PO TABS: 5.0000 mg | ORAL_TABLET | Freq: Every day | ORAL | Status: DC

## 2020-12-08 MED ORDER — DEXAMETHASONE SODIUM PHOSPHATE 10 MG/ML IJ SOLN
INTRAMUSCULAR | Status: AC
  Filled 2020-12-08: qty 1

## 2020-12-08 MED ORDER — SODIUM CHLORIDE 0.9 % IV SOLN: 250.0000 mL | INTRAVENOUS | Status: DC

## 2020-12-08 MED ORDER — PROPOFOL 500 MG/50ML IV EMUL
INTRAVENOUS | Status: DC | PRN
  Administered 2020-12-08: 125 ug/kg/min via INTRAVENOUS

## 2020-12-08 MED ORDER — ARTIFICIAL TEARS OPHTHALMIC OINT
TOPICAL_OINTMENT | OPHTHALMIC | Status: DC | PRN
  Administered 2020-12-08: 1 via OPHTHALMIC

## 2020-12-08 MED ORDER — SODIUM CHLORIDE 0.9% FLUSH: 3.0000 mL | INTRAVENOUS | Status: DC | PRN

## 2020-12-08 MED ORDER — VANCOMYCIN HCL IN DEXTROSE 1-5 GM/200ML-% IV SOLN
1000.0000 mg | INTRAVENOUS | Status: AC
  Administered 2020-12-08: 1000 mg via INTRAVENOUS
  Filled 2020-12-08: qty 200

## 2020-12-08 MED ORDER — ACETAMINOPHEN 500 MG PO TABS
1000.0000 mg | ORAL_TABLET | Freq: Once | ORAL | Status: AC
  Administered 2020-12-08: 500 mg via ORAL
  Filled 2020-12-08: qty 2

## 2020-12-08 MED ORDER — HYDROMORPHONE HCL 1 MG/ML IJ SOLN
INTRAMUSCULAR | Status: AC
  Filled 2020-12-08: qty 1

## 2020-12-08 MED ORDER — LIDOCAINE 2% (20 MG/ML) 5 ML SYRINGE
INTRAMUSCULAR | Status: AC
  Filled 2020-12-08: qty 5

## 2020-12-08 MED ORDER — HYDROMORPHONE HCL 1 MG/ML IJ SOLN
0.2500 mg | INTRAMUSCULAR | Status: DC | PRN
  Administered 2020-12-08 (×2): 0.5 mg via INTRAVENOUS

## 2020-12-08 MED ORDER — ACETAMINOPHEN 325 MG PO TABS: 650.0000 mg | ORAL_TABLET | ORAL | Status: DC | PRN

## 2020-12-08 MED ORDER — INSULIN LISPRO 100 UNIT/ML ~~LOC~~ SOLN: 2.0000 [IU] | SUBCUTANEOUS | Status: DC

## 2020-12-08 SURGICAL SUPPLY — 71 items
ADH SKN CLS APL DERMABOND .7 (GAUZE/BANDAGES/DRESSINGS) ×1
APL SKNCLS STERI-STRIP NONHPOA (GAUZE/BANDAGES/DRESSINGS) ×1
BAG DECANTER FOR FLEXI CONT (MISCELLANEOUS) ×3 IMPLANT
BENZOIN TINCTURE PRP APPL 2/3 (GAUZE/BANDAGES/DRESSINGS) ×3 IMPLANT
BLADE CLIPPER SURG (BLADE) IMPLANT
BONE GRAFTON DBF INJECT 3CC (Bone Implant) ×4 IMPLANT
BUR CUTTER 7.0 ROUND (BURR) IMPLANT
BUR MATCHSTICK NEURO 3.0 LAGG (BURR) ×3 IMPLANT
CAGE EXP CATALYFT 9 (Plate) ×4 IMPLANT
CANISTER SUCT 3000ML PPV (MISCELLANEOUS) ×3 IMPLANT
CAP LCK SPNE (Orthopedic Implant) ×4 IMPLANT
CAP LOCK SPINE RADIUS (Orthopedic Implant) IMPLANT
CAP LOCKING (Orthopedic Implant) ×12 IMPLANT
CARTRIDGE OIL MAESTRO DRILL (MISCELLANEOUS) ×1 IMPLANT
CATH FOLEY LATEX FREE 16FR (CATHETERS) ×3
CATH FOLEY LF 16FR (CATHETERS) IMPLANT
CLOSURE WOUND 1/2 X4 (GAUZE/BANDAGES/DRESSINGS) ×1
CNTNR URN SCR LID CUP LEK RST (MISCELLANEOUS) ×1 IMPLANT
CONT SPEC 4OZ STRL OR WHT (MISCELLANEOUS) ×3
COVER BACK TABLE 60X90IN (DRAPES) ×3 IMPLANT
COVER WAND RF STERILE (DRAPES) ×3 IMPLANT
DECANTER SPIKE VIAL GLASS SM (MISCELLANEOUS) ×3 IMPLANT
DERMABOND ADVANCED (GAUZE/BANDAGES/DRESSINGS) ×2
DERMABOND ADVANCED .7 DNX12 (GAUZE/BANDAGES/DRESSINGS) ×1 IMPLANT
DIFFUSER DRILL AIR PNEUMATIC (MISCELLANEOUS) ×3 IMPLANT
DRAPE C-ARM 42X72 X-RAY (DRAPES) ×6 IMPLANT
DRAPE HALF SHEET 40X57 (DRAPES) IMPLANT
DRAPE LAPAROTOMY 100X72X124 (DRAPES) ×3 IMPLANT
DRAPE SURG 17X23 STRL (DRAPES) ×12 IMPLANT
DRSG OPSITE POSTOP 4X6 (GAUZE/BANDAGES/DRESSINGS) ×3 IMPLANT
DURAPREP 26ML APPLICATOR (WOUND CARE) ×3 IMPLANT
ELECT REM PT RETURN 9FT ADLT (ELECTROSURGICAL) ×3
ELECTRODE REM PT RTRN 9FT ADLT (ELECTROSURGICAL) ×1 IMPLANT
EVACUATOR 1/8 PVC DRAIN (DRAIN) IMPLANT
GAUZE 4X4 16PLY RFD (DISPOSABLE) IMPLANT
GAUZE SPONGE 4X4 12PLY STRL (GAUZE/BANDAGES/DRESSINGS) IMPLANT
GLOVE BIO SURGEON STRL SZ 6.5 (GLOVE) ×1 IMPLANT
GLOVE BIO SURGEON STRL SZ7.5 (GLOVE) ×2 IMPLANT
GLOVE BIO SURGEONS STRL SZ 6.5 (GLOVE)
GLOVE ECLIPSE 9.0 STRL (GLOVE) ×2 IMPLANT
GLOVE EXAM NITRILE XL STR (GLOVE) IMPLANT
GLOVE SS PI 9.0 STRL (GLOVE) ×4 IMPLANT
GLOVE SURG SS PI 6.5 STRL IVOR (GLOVE) ×12 IMPLANT
GLOVE SURG SS PI 7.5 STRL IVOR (GLOVE) ×2 IMPLANT
GLOVE SURG UNDER POLY LF SZ6.5 (GLOVE) ×7 IMPLANT
GOWN STRL REUS W/ TWL LRG LVL3 (GOWN DISPOSABLE) IMPLANT
GOWN STRL REUS W/ TWL XL LVL3 (GOWN DISPOSABLE) ×2 IMPLANT
GOWN STRL REUS W/TWL 2XL LVL3 (GOWN DISPOSABLE) IMPLANT
GOWN STRL REUS W/TWL LRG LVL3 (GOWN DISPOSABLE) ×9
GOWN STRL REUS W/TWL XL LVL3 (GOWN DISPOSABLE) ×6
KIT BASIN OR (CUSTOM PROCEDURE TRAY) ×3 IMPLANT
KIT TURNOVER KIT B (KITS) ×3 IMPLANT
MILL MEDIUM DISP (BLADE) ×3 IMPLANT
NEEDLE HYPO 22GX1.5 SAFETY (NEEDLE) ×3 IMPLANT
NS IRRIG 1000ML POUR BTL (IV SOLUTION) ×3 IMPLANT
OIL CARTRIDGE MAESTRO DRILL (MISCELLANEOUS) ×3
PACK LAMINECTOMY NEURO (CUSTOM PROCEDURE TRAY) ×3 IMPLANT
RASP 3.0MM (RASP) ×2 IMPLANT
ROD 50MM (Rod) ×6 IMPLANT
ROD SPNL 50X5.5XNS TI RDS (Rod) IMPLANT
SCREW 5.75X45MM (Screw) ×4 IMPLANT
SPONGE SURGIFOAM ABS GEL 100 (HEMOSTASIS) ×3 IMPLANT
STRIP CLOSURE SKIN 1/2X4 (GAUZE/BANDAGES/DRESSINGS) ×3 IMPLANT
SUT VIC AB 0 CT1 18XCR BRD8 (SUTURE) ×2 IMPLANT
SUT VIC AB 0 CT1 8-18 (SUTURE) ×3
SUT VIC AB 2-0 CT1 18 (SUTURE) ×3 IMPLANT
SUT VIC AB 3-0 SH 8-18 (SUTURE) ×4 IMPLANT
TOWEL GREEN STERILE (TOWEL DISPOSABLE) ×3 IMPLANT
TOWEL GREEN STERILE FF (TOWEL DISPOSABLE) ×3 IMPLANT
TRAY FOLEY MTR SLVR 16FR STAT (SET/KITS/TRAYS/PACK) ×1 IMPLANT
WATER STERILE IRR 1000ML POUR (IV SOLUTION) ×3 IMPLANT

## 2020-12-08 NOTE — Transfer of Care (Signed)
Immediate Anesthesia Transfer of Care Note  Patient: [name]  Procedure(s) Performed: Posterior Lumbar Interbody Fusion - Lumbar two-Lumbar three (N/A Back)  Patient Location: PACU  Anesthesia Type:General  Level of Consciousness: drowsy and patient cooperative  Airway & Oxygen Therapy: Patient Spontanous Breathing and Patient connected to face mask oxygen  Post-op Assessment: Report given to RN, Post -op Vital signs reviewed and stable and Patient moving all extremities  Post vital signs: Reviewed and stable  Last Vitals:  Vitals Value Taken Time  BP 140/74 12/08/20 1032  Temp    Pulse 67 12/08/20 1034  Resp 14 12/08/20 1034  SpO2 100 % 12/08/20 1034  Vitals shown include unvalidated device data.  Last Pain:  Vitals:   12/08/20 0713  TempSrc:   PainSc: 5       Patients Stated Pain Goal: 2 (76/81/15 7262)  Complications: No complications documented.

## 2020-12-08 NOTE — H&P (Signed)
[name] is an 60 y.o. female.   Chief Complaint: Pain HPI: 60 year old female with progressive back and left anterior thigh and groin pain.  Patient status post prior L3-S1 decompression and fusion surgery.  Pain is worsened over time with associated sensory loss and some weakness.  Work-up demonstrates evidence of adjacent level degeneration with degenerative retrolisthesis and foraminal stenosis left greater than right at L2-3.  Patient presents now for decompression and fusion after failing conservative management.  Past Medical History:  Diagnosis Date  . Anxiety   . Barrett esophagus    Dr. Olevia Perches in GI  . Cancer ([place])    Skin Cancer  . Chronic back pain    stenosis and DDD  . DDD (degenerative disc disease)    s/p back surgeries and steroid injxns  . Depression    takes Fluoxetine daily  . Diabetes mellitus    Victoza daily  . GERD (gastroesophageal reflux disease)    takes Nexium daily  . History of kidney stones   . Hyperlipidemia    hasn't been on Pravastatin in 6-7wks   . Hypertension    takes Amlodipine and HCTZ daily  . Interstitial cystitis   . Joint pain   . Joint swelling   . Muscle spasm    takes Ativan nightly as needed  . Numbness    weakness in right leg  . Pneumonia    fall 2014  . PONV (postoperative nausea and vomiting)   . Spinal headache    blood patch required   . Urinary frequency     Past Surgical History:  Procedure Laterality Date  . adhesions removed from abdomen    . APPENDECTOMY    . BACK SURGERY  12/2007   x 6-fusion x 2  . CARDIAC CATHETERIZATION  2013  . CESAREAN SECTION  1988/1989  . CHOLECYSTECTOMY    . COLONOSCOPY  2011  . epidural injections    . KNEE ARTHROSCOPY Bilateral   . LAPAROSCOPIC LYSIS OF ADHESIONS N/A 04/15/2014   Procedure: LAPAROSCOPIC LYSIS OF ADHESION;  Surgeon: Joyice Faster. Cornett, MD;  Location: [facility];  Service: General;  Laterality: N/A;  . LAPAROSCOPY N/A 04/15/2014   Procedure: LAPAROSCOPY  DIAGNOSTIC;  Surgeon: Joyice Faster. Cornett, MD;  Location: [facility];  Service: General;  Laterality: N/A;  . LEFT HEART CATHETERIZATION WITH CORONARY ANGIOGRAM N/A 02/03/2012   Procedure: LEFT HEART CATHETERIZATION WITH CORONARY ANGIOGRAM;  Surgeon: Troy Sine, MD;  Location: [facility] C[facility] LAB;  Service: Cardiovascular;  Laterality: N/A;  . LUMBAR FUSION Right 03/24/2015   Procedure: SI joint fusion - right ;  Surgeon: Karie Chimera, MD;  Location: [facility] [facility] NEURO ORS;  Service: Neurosurgery;  Laterality: Right;  SI joint fusion - right   . NECK SURGERY  12/2007  . OOPHORECTOMY    . TOTAL ABDOMINAL HYSTERECTOMY    . TOTAL KNEE ARTHROPLASTY Right 12/25/2018  . TOTAL KNEE ARTHROPLASTY Right 12/25/2018   Procedure: RIGHT TOTAL KNEE ARTHROPLASTY;  Surgeon: Meredith Pel, MD;  Location: [facility];  Service: Orthopedics;  Laterality: Right;  . upper gi endoscopy      Family History  Problem Relation Age of Onset  . Hypertension Father   . Colon polyps Father   . Heart disease Mother        ischemic  . Gallbladder disease Mother   . Other Mother        rectovaginal fistula  . Allergies Sister   . Asthma Sister    Social History:  reports that she has quit smoking. Her smoking use included cigarettes. She has a 5.10 pack-year smoking history. She has never used smokeless tobacco. She reports current alcohol use. She reports that she does not use drugs.  Allergies:  Allergies  Allergen Reactions  . Latex Anaphylaxis  . Penicillins Anaphylaxis and Other (See Comments)    Has patient had a PCN reaction causing immediate rash, facial/tongue/throat swelling, SOB or lightheadedness with hypotension: No Has patient had a PCN reaction causing severe rash involving mucus membranes or skin necrosis: No Has patient had a PCN reaction that required hospitalization No Has patient had a PCN reaction occurring within the last 10 years: No If all of the above answers are "NO", then may proceed with Cephalosporin use.  .  Erythromycin Hives and Itching  . Ofloxacin Hives and Itching  . Sulfonamide Derivatives Hives and Itching  . Azithromycin Other (See Comments)    "makes my heart rate go crazy"  . Fenofibrate Other (See Comments)    Cramps  . Statins Nausea And Vomiting    Cramps    Medications Prior to Admission  Medication Sig Dispense Refill  . amLODipine (NORVASC) 5 MG tablet Take 5 mg by mouth daily.    Marland Kitchen aspirin 81 MG chewable tablet Chew 1 tablet (81 mg total) by mouth 2 (two) times daily. 42 tablet 0  . docusate sodium (COLACE) 100 MG capsule Take 1 capsule (100 mg total) by mouth 2 (two) times daily. 10 capsule 0  . DULoxetine (CYMBALTA) 20 MG capsule Take 20 mg by mouth 2 (two) times daily.    Marland Kitchen ezetimibe (ZETIA) 10 MG tablet Take 10 mg by mouth daily.    Marland Kitchen FLUoxetine (PROZAC) 40 MG capsule Take 1 capsule (40 mg total) by mouth daily. NEEDS APPT FOR REFILLS (Patient taking differently: Take 40 mg by mouth daily.) 90 capsule 0  . hydrochlorothiazide (HYDRODIURIL) 25 MG tablet Take 1 tablet (25 mg total) by mouth daily. NEEDS APPT FOR REFILLS (Patient taking differently: Take 25 mg by mouth daily.) 90 tablet 0  . HYDROcodone-acetaminophen (NORCO/VICODIN) 5-325 MG tablet 1 po q 4-6hrs prn pain (Patient taking differently: Take 1 tablet by mouth every 4 (four) hours as needed for moderate pain.) 45 tablet 0  . insulin lispro (HUMALOG) 100 UNIT/ML injection Inject 2 Units into the skin See admin instructions. Inject 2 units SQ up to 6 times daily as needed for BS > 250    . JARDIANCE 25 MG TABS tablet Take 25 mg by mouth daily.  5  . LORazepam (ATIVAN) 1 MG tablet Take 1 mg by mouth at bedtime.    . pantoprazole (PROTONIX) 40 MG tablet Take 40 mg by mouth daily.    . Potassium 99 MG TABS Take 99 mg by mouth daily.    . Semaglutide,0.25 or 0.5MG /DOS, 2 MG/1.5ML SOPN Inject 0.5 mg into the skin every Friday.     . TRESIBA FLEXTOUCH 100 UNIT/ML SOPN FlexTouch Pen Inject 50 Units into the skin daily.   5   . acetaminophen (TYLENOL) 325 MG tablet Take 1-2 tablets (325-650 mg total) by mouth every 6 (six) hours as needed for mild pain (pain score 1-3 or temp > 100.5). 60 tablet 0  . methocarbamol (ROBAXIN) 500 MG tablet Take 1 tablet (500 mg total) by mouth every 6 (six) hours as needed for muscle spasms. (Patient not taking: No sig reported) 30 tablet 0  . potassium chloride (K-DUR) 10 MEQ tablet Take 2 tablets (20 mEq total)  by mouth daily. (Patient not taking: No sig reported) 20 tablet 0  . traMADol (ULTRAM) 50 MG tablet Take 1 tablet (50 mg total) by mouth every 6 (six) hours. (Patient not taking: No sig reported) 30 tablet 0    Results for orders placed or performed during the hospital encounter of 12/08/20 (from the past 48 hour(s))  Glucose, capillary     Status: Abnormal   Collection Time: 12/08/20  6:31 AM  Result Value Ref Range   Glucose-Capillary 146 (H) 70 - 99 mg/dL    Comment: Glucose reference range applies only to samples taken after fasting for at least 8 hours.   Comment 1 Notify RN    No results found.  Pertinent items noted in HPI and remainder of comprehensive ROS otherwise negative.  Blood pressure (!) 149/88, pulse 85, temperature 98.1 F (36.7 C), temperature source Oral, resp. rate 18, height 5\' 6"  (1.676 m), weight 93.3 kg, SpO2 97 %.  Patient is awake and alert.  She is oriented and appropriate.  Speech is fluent.  Judgment insight are intact.  Cranial nerve function normal bilateral.  Motor examination extremities reveal some mild weakness of hip flexion on the left side otherwise motor strength intact.  Sensor examination with decrease sensation pinprick light touch in her left L2 and L3 dermatomes.  Deep tendon reflexes normal active.  Except her Achilles reflexes are absent.  Gait is antalgic.  Posture is normal peer examination head ears eyes nose throat is unremarked.  Chest and abdomen are benign.  Extremities are free of major deformity. Assessment/Plan L2-3  degenerative retrolisthesis with foraminal stenosis and radiculopathy status post L3-S1 fusion.  Plan bilateral L2-3 decompressive laminotomies and foraminotomies followed by posterior lumbar MRI fusion utilizing interbody cages, will Kerrison autograft, and augmented with posterior arthrodesis utilizing nonsegmental pedicle screw fixation local autografting.  Risks and benefits been explained.  Patient wishes to proceed.  Cooper Render Norrin Shreffler 12/08/2020, 7:48 AM

## 2020-12-08 NOTE — Anesthesia Procedure Notes (Addendum)
Procedure Name: Intubation Date/Time: 12/08/2020 8:06 AM Performed by: Hoy Morn, CRNA Pre-anesthesia Checklist: Patient identified, Emergency Drugs available, Suction available and Patient being monitored Patient Re-evaluated:Patient Re-evaluated prior to induction Oxygen Delivery Method: Circle system utilized Preoxygenation: Pre-oxygenation with 100% oxygen Induction Type: IV induction Ventilation: Two handed mask ventilation required Laryngoscope Size: Glidescope and 3 Grade View: Grade I Tube type: Oral Tube size: 7.0 mm Number of attempts: 1 Airway Equipment and Method: Rigid stylet and Video-laryngoscopy Placement Confirmation: ETT inserted through vocal cords under direct vision,  positive ETCO2 and breath sounds checked- equal and bilateral Secured at: 21 cm Tube secured with: Tape Dental Injury: Teeth and Oropharynx as per pre-operative assessment  Comments: Elective Glidescope intubation d/t prior cervical surgery. Atraumatic placement by Dorene Grebe, SRNA.

## 2020-12-08 NOTE — Op Note (Signed)
Date of procedure: 12/08/2020  Date of dictation: Same  Service: Neurosurgery  Preoperative diagnosis: L2-L3 degenerative spondylolisthesis with foraminal stenosis and radiculopathy  Postoperative diagnosis: Same  Procedure Name: Bilateral L2-3 decompressive laminotomies and foraminotomies, more than would be required for simple interbody fusion alone.  Bilateral L2-3 ponte osteotomies for sagittal plane correction  L2-3 posterior lumbar interbody fusion utilizing interbody cages, locally harvested autograft, morselized allograft.  L2-3 posterior lateral arthrodesis utilizing nonsegmental pedicle screw fixation and local autografting  Reexploration of L3-4 fusion with removal of hardware.  Surgeon:Kylar Leonhardt A.Angelo Prindle, M.D.  Asst. Surgeon: Venetia Constable  Anesthesia: General  Indication: 60 year old female status post prior L3 through sacrum fusion presents with worsening back and bilateral proximal lower extremity symptoms.  Work-up demonstrates evidence of L2-3 adjacent level degeneration with degenerative retrolisthesis and foraminal stenosis.  Patient is failed conservative management.  She presents now for decompression and fusion in hopes of improving her symptoms.  Operative note: After induction of anesthesia, patient positioned prone on the Wilson frame and properly padded.  Lumbar region prepped and draped sterilely.  Incision made overlying L2-4.  Dissection performed bilaterally.  Retractor placed.  Fluoroscopy used.  Levels confirmed.  Previously placed hardware from L3-L5 was dissected below the level of the transverse connector.  The rods were cut above the level of the L4 screw heads bilaterally.  The proximal aspect the construct was disassembled and the rods were removed.  The fusion at L3-4 was visually inspected and found to be solid and had previously been found to be radiographically solid as well.  Attention then placed to the L2-3 interspace.  Decompressive laminotomies were  then performed bilaterally using Leksell rongeurs Kerrison rongeurs and high-speed drill to remove the inferior two thirds of the lamina of L2 the entire inferior facet and pars interarticularis of L2 bilaterally and the superior superior facet of L3 was resected bilaterally.  The facet complex was completely disarticulated and resected completing ponte osteotomies which allowed for mobilization and reduction of the patient's deformity.  Bilateral discectomies then performed at L2-3.  The space then prepared for interbody fusion.  With a distractor placed patient's right side the space was further cleaned of soft tissue on the left.  A 9 mm Medtronic expandable cage was then impacted into place and expanded.  Distractor was removed patient's right side.  This patient then prepared on the right side.  Soft tissue was removed.  Morselized autograft was packed throughout the disc base.  A second cage was impacted into place and expanded.  Pedicles of L2 were then identified using surface landmarks and intraoperative fluoroscopy.  Superficial bone overlying the pedicle was removed using high-speed drill.  Pedicle was then probed using a pedicle all each pedicle all track was then probed and found to be solidly within the bone.  Each pedicle tract was tapped with a screw tap and then 5.75 mm radius brand screws from [employer] were placed bilaterally at L2.  Final images reveal good position of the cages and the hardware at the proper operative level with normal alignment of the spine.  Demineralized bone fiber was then packed throughout the cages in the interspace.  Transverse processes were decorticated.  Morselized autograft was packed posterior laterally.  Short segment titanium rod placed over the screw heads at L2 and L3.  Locking caps placed over the screws.  Locking caps and engaged with construct under compression.  Gelfoam was placed over the laminotomy defects.  Vancomycin powder placed the deep wound  space.  Wounds and closed in layers of Vicryl sutures.  Steri-Strips and sterile dressing were applied.  No apparent complications.  Patient tolerated the procedure well and she returns to recovery room postop.

## 2020-12-08 NOTE — Brief Op Note (Signed)
12/08/2020  10:19 AM  PATIENT:  [name]  60 y.o. female  PRE-OPERATIVE DIAGNOSIS:  Degenerative Disc Disease- Stenosis  POST-OPERATIVE DIAGNOSIS:  Degenerative Disc Disease- Stenosis  PROCEDURE:  Procedure(s): Posterior Lumbar Interbody Fusion - Lumbar two-Lumbar three (N/A)  SURGEON:  Surgeon(s) and Role:    * Earnie Larsson, MD - Primary    * Judith Part, MD - Assisting  PHYSICIAN ASSISTANT:   ASSISTANTS:    ANESTHESIA:   general  EBL:  250 mL   BLOOD ADMINISTERED:none  DRAINS: none   LOCAL MEDICATIONS USED:  MARCAINE     SPECIMEN:  No Specimen  DISPOSITION OF SPECIMEN:  N/A  COUNTS:  YES  TOURNIQUET:  * No tourniquets in log *  DICTATION: .Dragon Dictation  PLAN OF CARE: Admit for overnight observation  PATIENT DISPOSITION:  PACU - hemodynamically stable.   Delay start of Pharmacological VTE agent (>24hrs) due to surgical blood loss or risk of bleeding: yes

## 2020-12-08 NOTE — Progress Notes (Signed)
Orthopedic Tech Progress Note Patient Details:  KIARI HOSMER 06/23/1961 [id number] RN said patient has brace Patient ID: Patrice Paradise, female   DOB: [birth year], 60 y.o.   MRN: [id number]   Janit Pagan 12/08/2020, 2:59 PM

## 2020-12-08 NOTE — Progress Notes (Signed)
Pharmacy Antibiotic Note  [name] is a 60 y.o. female admitted on 12/08/2020 with L2-L3 degenerative spondylolisthesis.  Pharmacy has been consulted for vancomycin dosing for surgical prophylaxis. No drain in place   Plan: Vancomycin 750mg  x1   Height: 5\' 6"  (167.6 cm) Weight: 93.3 kg (205 lb 9.6 oz) IBW/kg (Calculated) : 59.3  Temp (24hrs), Avg:97.3 F (36.3 C), Min:97 F (36.1 C), Max:98.1 F (36.7 C)  No results for input(s): WBC, CREATININE, LATICACIDVEN, VANCOTROUGH, VANCOPEAK, VANCORANDOM, GENTTROUGH, GENTPEAK, GENTRANDOM, TOBRATROUGH, TOBRAPEAK, TOBRARND, AMIKACINPEAK, AMIKACINTROU, AMIKACIN in the last 168 hours.  CrCl cannot be calculated (Patient's most recent lab result is older than the maximum 21 days allowed.).    Allergies  Allergen Reactions  . Latex Anaphylaxis  . Penicillins Anaphylaxis and Other (See Comments)    Has patient had a PCN reaction causing immediate rash, facial/tongue/throat swelling, SOB or lightheadedness with hypotension: No Has patient had a PCN reaction causing severe rash involving mucus membranes or skin necrosis: No Has patient had a PCN reaction that required hospitalization No Has patient had a PCN reaction occurring within the last 10 years: No If all of the above answers are "NO", then may proceed with Cephalosporin use.  . Erythromycin Hives and Itching  . Ofloxacin Hives and Itching  . Sulfonamide Derivatives Hives and Itching  . Azithromycin Other (See Comments)    "makes my heart rate go crazy"  . Fenofibrate Other (See Comments)    Cramps  . Statins Nausea And Vomiting    Cramps    Antimicrobials this admission: Vancomycin x2 pre -op  Dose adjustments this admission: N/a  Microbiology results: N/a  Thank you for allowing pharmacy to be a part of this patient's care.  Cristela Felt, PharmD Clinical Pharmacist  12/08/2020 2:10 PM

## 2020-12-08 NOTE — Progress Notes (Signed)
Patient saturation noted to drop after receiving fentanyl  drop to 88% RA saturation 92-96% via 2 L [place]

## 2020-12-08 NOTE — Anesthesia Postprocedure Evaluation (Signed)
Anesthesia Post Note  Patient: [name]  Procedure(s) Performed: Posterior Lumbar Interbody Fusion - Lumbar two-Lumbar three (N/A Back)     Patient location during evaluation: PACU Anesthesia Type: General Level of consciousness: awake and alert Pain management: pain level controlled Vital Signs Assessment: post-procedure vital signs reviewed and stable Respiratory status: spontaneous breathing, nonlabored ventilation, respiratory function stable and patient connected to nasal cannula oxygen Cardiovascular status: blood pressure returned to baseline and stable Postop Assessment: no apparent nausea or vomiting Anesthetic complications: no   No complications documented.  Last Vitals:  Vitals:   12/08/20 1357 12/08/20 1648  BP: 118/77 120/89  Pulse: 80 79  Resp: 19 18  Temp: 36.6 C 36.6 C  SpO2: 100% 100%    Last Pain:  Vitals:   12/08/20 1440  TempSrc:   PainSc: [name]

## 2020-12-09 DIAGNOSIS — M4316 Spondylolisthesis, lumbar region: Secondary | ICD-10-CM | POA: Diagnosis not present

## 2020-12-09 LAB — GLUCOSE, CAPILLARY
Glucose-Capillary: 192 mg/dL — ABNORMAL HIGH (ref 70–99)
Glucose-Capillary: 252 mg/dL — ABNORMAL HIGH (ref 70–99)

## 2020-12-09 MED ORDER — CYCLOBENZAPRINE HCL 10 MG PO TABS: 10.0000 mg | ORAL_TABLET | Freq: Three times a day (TID) | ORAL | 0 refills | Status: AC | PRN

## 2020-12-09 MED ORDER — HYDROCODONE-ACETAMINOPHEN 10-325 MG PO TABS: 1.0000 | ORAL_TABLET | ORAL | 0 refills | Status: DC | PRN

## 2020-12-09 MED FILL — Sodium Chloride IV Soln 0.9%: INTRAVENOUS | Qty: 1000 | Status: AC

## 2020-12-09 MED FILL — Heparin Sodium (Porcine) Inj 1000 Unit/ML: INTRAMUSCULAR | Qty: 30 | Status: AC

## 2020-12-09 NOTE — Evaluation (Addendum)
Occupational Therapy Evaluation Patient Details Name: [name] MRN: [id number] DOB: [birth year] Today's Date: 12/09/2020    History of Present Illness 60 y.o female s/p PLIF L2-L3 by Earnie Larsson, MD on 12/08/2020. PMH includes but not limited to previous L3-S1 decompression and fusion, HTN, depression, DDD, skin cancer, hyperlipidemia, R TKA, left heart cath.   Clinical Impression   Patient evaluated by Occupational Therapy with no further acute OT needs identified. Pt demonstrated ability to complete full body dressing, grooming while standing at sink level, and toilet transfer at modified independent level without use of AD. Pt demonstrated adherence to 3/3 precautions. All education has been completed and the patient has no further questions. See below for any follow-up Occupational Therapy or equipment needs. OT to sign off. Thank you for referral.      Follow Up Recommendations  No OT follow up    Equipment Recommendations  None recommended by OT    Recommendations for Other Services       Precautions / Restrictions Precautions Precautions: Fall;Back Precaution Booklet Issued: Yes (comment) Precaution Comments: reviewed provided handout with pt Required Braces or Orthoses: Spinal Brace Spinal Brace: Lumbar corset Restrictions Weight Bearing Restrictions: No      Mobility Bed Mobility Overal bed mobility: Modified Independent                  Transfers Overall transfer level: Modified independent Equipment used: None             General transfer comment: ambulating in hallway and in room at modified independent level, guarded walking at times, no loss of balance, no instability    Balance Overall balance assessment: Modified Independent                                         ADL either performed or assessed with clinical judgement   ADL Overall ADL's : Modified independent                                        General ADL Comments: demonstrated ability to complete grooming, dressing, toilet transfer at modified independent level     Vision Patient Visual Report: No change from baseline       Perception     Praxis      Pertinent Vitals/Pain Pain Assessment: 0-10 Pain Score: 4  Pain Location: surgical site Pain Descriptors / Indicators: Sore;Guarding Pain Intervention(s): Limited activity within patient's tolerance;Monitored during session     Hand Dominance Right   Extremity/Trunk Assessment Upper Extremity Assessment Upper Extremity Assessment: Overall WFL for tasks assessed   Lower Extremity Assessment Lower Extremity Assessment: Defer to PT evaluation;RLE deficits/detail RLE Deficits / Details: previous TKA, slightly weaker than LLE, 4/5   Cervical / Trunk Assessment Cervical / Trunk Assessment: Other exceptions Cervical / Trunk Exceptions: s/p PLIF   Communication Communication Communication: No difficulties   Cognition Arousal/Alertness: Awake/alert Behavior During Therapy: WFL for tasks assessed/performed Overall Cognitive Status: Within Functional Limits for tasks assessed                                 General Comments: demonstrates good adherence to back precautions   General Comments  vss    Exercises  Shoulder Instructions      Home Living Family/patient expects to be discharged to:: Private residence Living Arrangements: Spouse/significant other Available Help at Discharge: Family;Available 24 hours/day Type of Home: House (townhome) Home Access: Level entry (through garage)     Home Layout: Two level Alternate Level Stairs-Number of Steps: flight with a landing   Bathroom Shower/Tub: Occupational psychologist: [place]: Environmental consultant - 2 wheels;Shower seat;Bedside commode   Additional Comments: pt lives with her husband, both retired, she is a retired RN;bed/bath on main level      Prior  Functioning/Environment Level of Independence: Independent                 OT Problem List: Pain;Decreased knowledge of precautions      OT Treatment/Interventions:      OT Goals(Current goals can be found in the care plan section) Acute Rehab OT Goals Patient Stated Goal: to go home today OT Goal Formulation: With patient Time For Goal Achievement: 12/16/20 Potential to Achieve Goals: Good  OT Frequency:     Barriers to D/C:            Co-evaluation              AM-PAC OT "6 Clicks" Daily Activity     Outcome Measure Help from another person eating meals?: None Help from another person taking care of personal grooming?: None Help from another person toileting, which includes using toliet, bedpan, or urinal?: None Help from another person bathing (including washing, rinsing, drying)?: None Help from another person to put on and taking off regular upper body clothing?: None Help from another person to put on and taking off regular lower body clothing?: None 6 Click Score: 24   End of Session Equipment Utilized During Treatment: Back brace Nurse Communication: Mobility status  Activity Tolerance: Patient tolerated treatment well Patient left: in bed;with call bell/phone within reach  OT Visit Diagnosis: Other abnormalities of gait and mobility (R26.89);Pain Pain - part of body:  (back)                Time: 0160-1093 OT Time Calculation (min): 12 min Charges:  OT General Charges $OT Visit: 1 Visit OT Evaluation $OT Eval Low Complexity: [place] OTR/L Acute Rehabilitation Services Office: [place] 12/09/2020, 8:16 AM

## 2020-12-09 NOTE — Discharge Instructions (Signed)

## 2020-12-09 NOTE — Discharge Summary (Signed)
Physician Discharge Summary  Patient ID: CORLIS ANGELICA MRN: [id number] DOB/AGE: 01-17-1961 60 y.o.  Admit date: 12/08/2020 Discharge date: 12/09/2020  Admission Diagnoses:  Discharge Diagnoses:  Active Problems:   Degenerative spondylolisthesis   Discharged Condition: good  Hospital Course: Patient admitted to the hospital where she underwent uncomplicated lumbar decompression and fusion.  Postoperatively doing very well.  Preoperative back and lower neck pain much improved.  Standing and ambulating without difficulty.  Voiding well.  Ready for discharge home.  Consults:   Significant Diagnostic Studies:   Treatments:   Discharge Exam: Blood pressure 116/65, pulse 70, temperature 99.5 F (37.5 C), temperature source Oral, resp. rate 18, height 5\' 6"  (1.676 m), weight 93.3 kg, SpO2 98 %. Awake and alert.  Oriented and appropriate.  Motor and sensory function intact.  Wound clean and dry.  Chest and abdomen benign.  Disposition: Discharge disposition: 01-Home or Self Care        Allergies as of 12/09/2020      Reactions   Latex Anaphylaxis   Penicillins Anaphylaxis, Other (See Comments)   Has patient had a PCN reaction causing immediate rash, facial/tongue/throat swelling, SOB or lightheadedness with hypotension: No Has patient had a PCN reaction causing severe rash involving mucus membranes or skin necrosis: No Has patient had a PCN reaction that required hospitalization No Has patient had a PCN reaction occurring within the last 10 years: No If all of the above answers are "NO", then may proceed with Cephalosporin use.   Erythromycin Hives, Itching   Ofloxacin Hives, Itching   Sulfonamide Derivatives Hives, Itching   Azithromycin Other (See Comments)   "makes my heart rate go crazy"   Fenofibrate Other (See Comments)   Cramps   Statins Nausea And Vomiting   Cramps      Medication List    TAKE these medications   acetaminophen 325 MG tablet Commonly known as:  TYLENOL Take 1-2 tablets (325-650 mg total) by mouth every 6 (six) hours as needed for mild pain (pain score 1-3 or temp > 100.5).   amLODipine 5 MG tablet Commonly known as: NORVASC Take 5 mg by mouth daily.   aspirin 81 MG chewable tablet Chew 1 tablet (81 mg total) by mouth 2 (two) times daily.   cyclobenzaprine 10 MG tablet Commonly known as: FLEXERIL Take 1 tablet (10 mg total) by mouth 3 (three) times daily as needed for muscle spasms.   DULoxetine 20 MG capsule Commonly known as: CYMBALTA Take 20 mg by mouth 2 (two) times daily.   ezetimibe 10 MG tablet Commonly known as: ZETIA Take 10 mg by mouth daily.   FLUoxetine 40 MG capsule Commonly known as: PROZAC Take 1 capsule (40 mg total) by mouth daily. NEEDS APPT FOR REFILLS What changed: additional instructions   hydrochlorothiazide 25 MG tablet Commonly known as: HYDRODIURIL Take 1 tablet (25 mg total) by mouth daily. NEEDS APPT FOR REFILLS What changed: additional instructions   HYDROcodone-acetaminophen 5-325 MG tablet Commonly known as: NORCO/VICODIN 1 po q 4-6hrs prn pain What changed:   how much to take  how to take this  when to take this  reasons to take this  additional instructions   HYDROcodone-acetaminophen 10-325 MG tablet Commonly known as: NORCO Take 1 tablet by mouth every 4 (four) hours as needed for moderate pain ((score 4 to 6)). What changed: You were already taking a medication with the same name, and this prescription was added. Make sure you understand how and when to take each.  insulin lispro 100 UNIT/ML injection Commonly known as: HUMALOG Inject 2 Units into the skin See admin instructions. Inject 2 units SQ up to 6 times daily as needed for BS > 250   Jardiance 25 MG Tabs tablet Generic drug: empagliflozin Take 25 mg by mouth daily.   LORazepam 1 MG tablet Commonly known as: ATIVAN Take 1 mg by mouth at bedtime.   pantoprazole 40 MG tablet Commonly known as:  PROTONIX Take 40 mg by mouth daily.   Potassium 99 MG Tabs Take 99 mg by mouth daily.   Semaglutide(0.25 or 0.5MG /DOS) 2 MG/1.5ML Sopn Inject 0.5 mg into the skin every Friday.   Tyler Aas FlexTouch 100 UNIT/ML FlexTouch Pen Generic drug: insulin degludec Inject 50 Units into the skin daily.            Durable Medical Equipment  (From admission, onward)         Start     Ordered   12/08/20 1402  DME Walker rolling  Once       Question:  Patient needs a walker to treat with the following condition  Answer:  Degenerative spondylolisthesis   12/08/20 1401   12/08/20 1402  DME 3 n 1  Once        12/08/20 1401           Signed: [place] 12/09/2020, 8:33 AM

## 2020-12-09 NOTE — Progress Notes (Signed)
Pt doing well. Pt and husband given D/C instructions with verbal understanding. Rx's were sent to the pharmacy by MD. Pt's incision is clean and dry with no sign of infection. Pt's IV was removed prior to D/C. Pt D/C'd home via wheelchair per MD order. Pt is stable @ D/C and has no other needs at this time. Konrad Hoak, RN  

## 2020-12-09 NOTE — Evaluation (Signed)
Physical Therapy Evaluation and Discharge Patient Details Name: [name] MRN: [id number] DOB: November 14, 1960 Today's Date: 12/09/2020   History of Present Illness  60 y.o female s/p PLIF L2-L3 by Earnie Larsson, MD on 12/08/2020. PMH includes but not limited to previous L3-S1 decompression and fusion, HTN, depression, DDD, skin cancer, hyperlipidemia, R TKA, left heart cath.    Clinical Impression  Patient evaluated by Physical Therapy with no further acute PT needs identified. All education has been completed and the patient has no further questions. Pt was able to demonstrate transfers and ambulation with gross modified independence and no AD. Pt was educated on precautions, brace application/wearing schedule, appropriate activity progression, and car transfer. See below for any follow-up Physical Therapy or equipment needs. PT is signing off. Thank you for this referral.     Follow Up Recommendations No PT follow up;Supervision for mobility/OOB    Equipment Recommendations  None recommended by PT    Recommendations for Other Services       Precautions / Restrictions Precautions Precautions: Fall;Back Precaution Booklet Issued: Yes (comment) Precaution Comments: reviewed provided handout with pt Required Braces or Orthoses: Spinal Brace Spinal Brace: Lumbar corset Restrictions Weight Bearing Restrictions: No      Mobility  Bed Mobility Overal bed mobility: Modified Independent                  Transfers Overall transfer level: Modified independent Equipment used: None             General transfer comment: No assist required.  Ambulation/Gait Ambulation/Gait assistance: Modified independent (Device/Increase time) Gait Distance (Feet): 400 Feet Assistive device: None Gait Pattern/deviations: WFL(Within Functional Limits) Gait velocity: Decreased Gait velocity interpretation: 1.31 - 2.62 ft/sec, indicative of limited community ambulator General Gait Details:  Overall ambulating well but with slowed gait speed  Stairs Stairs: Yes Stairs assistance: Supervision Stair Management: One rail Right;Alternating pattern;Forwards Number of Stairs: 10 General stair comments: VC's for sequencing and general safety.  Wheelchair Mobility    Modified Rankin (Stroke Patients Only)       Balance Overall balance assessment: Modified Independent                                           Pertinent Vitals/Pain Pain Assessment: 0-10 Pain Score: 4  Pain Location: surgical site Pain Descriptors / Indicators: Sore;Guarding Pain Intervention(s): Limited activity within patient's tolerance;Monitored during session;Repositioned    Home Living Family/patient expects to be discharged to:: Private residence Living Arrangements: Spouse/significant other Available Help at Discharge: Family;Available 24 hours/day Type of Home: House (townhome) Home Access: Level entry (through garage)     Home Layout: Two level Home Equipment: [place] - 2 wheels;Shower seat;Bedside commode Additional Comments: pt lives with her husband, both retired, she is a retired RN;bed/bath on main level    Prior Function Level of Independence: Independent               [place]: Right    Extremity/Trunk Assessment   Upper Extremity Assessment Upper Extremity Assessment: Defer to OT evaluation    Lower Extremity Assessment Lower Extremity Assessment: Overall WFL for tasks assessed RLE Deficits / Details: previous TKA, slightly weaker than LLE, 4/5    Cervical / Trunk Assessment Cervical / Trunk Assessment: Other exceptions Cervical / Trunk Exceptions: s/p PLIF  Communication   Communication: No difficulties  Cognition Arousal/Alertness: Awake/alert  Behavior During Therapy: WFL for tasks assessed/performed Overall Cognitive Status: Within Functional Limits for tasks assessed                                  General Comments: demonstrates good adherence to back precautions      General Comments General comments (skin integrity, edema, etc.): vss    Exercises     Assessment/Plan    PT Assessment [place] does not need any further PT services  PT Problem List         PT Treatment Interventions      PT Goals (Current goals can be found in the Care Plan section)  Acute Rehab PT Goals Patient Stated Goal: to go home today PT Goal Formulation: All assessment and education complete, DC therapy    Frequency     Barriers to discharge        Co-evaluation               AM-PAC PT "6 Clicks" Mobility  Outcome Measure Help needed turning from your back to your side while in a flat bed without using bedrails?: None Help needed moving from lying on your back to sitting on the side of a flat bed without using bedrails?: None Help needed moving to and from a bed to a chair (including a wheelchair)?: None Help needed standing up from a chair using your arms (e.g., wheelchair or bedside chair)?: None Help needed to walk in hospital room?: None Help needed climbing 3-5 steps with a railing? : [place] 6 Click Score: 23    End of Session Equipment Utilized During Treatment: Gait belt;Back brace Activity Tolerance: Patient tolerated treatment well Patient left: in bed;with family/visitor present;with call bell/phone within reach Nurse Communication: Mobility status PT Visit Diagnosis: Unsteadiness on feet (R26.81);Pain Pain - part of body:  (back)    Time: 2774-1287 PT Time Calculation (min) (ACUTE ONLY): 11 min   Charges:   PT Evaluation $PT Eval Low Complexity: 1 Low          Rolinda Roan, PT, DPT Acute Rehabilitation Services Pager: [phone] Office: [phone]   Thelma Comp 12/09/2020, 11:13 AM

## 2021-12-10 ENCOUNTER — Emergency Department (HOSPITAL_BASED_OUTPATIENT_CLINIC_OR_DEPARTMENT_OTHER): Payer: Medicare Other

## 2021-12-10 ENCOUNTER — Emergency Department (HOSPITAL_BASED_OUTPATIENT_CLINIC_OR_DEPARTMENT_OTHER)
Admission: EM | Admit: 2021-12-10 | Discharge: 2021-12-10 | Disposition: A | Discharge status: Home / Self Care | Payer: Medicare Other | Attending: Emergency Medicine | Admitting: Emergency Medicine

## 2021-12-10 ENCOUNTER — Other Ambulatory Visit: Payer: Self-pay

## 2021-12-10 ENCOUNTER — Encounter (HOSPITAL_BASED_OUTPATIENT_CLINIC_OR_DEPARTMENT_OTHER): Payer: Self-pay | Admitting: Obstetrics and Gynecology

## 2021-12-10 DIAGNOSIS — R109 Unspecified abdominal pain: Secondary | ICD-10-CM | POA: Diagnosis present

## 2021-12-10 DIAGNOSIS — N3090 Cystitis, unspecified without hematuria: Secondary | ICD-10-CM | POA: Diagnosis not present

## 2021-12-10 DIAGNOSIS — Z794 Long term (current) use of insulin: Secondary | ICD-10-CM | POA: Diagnosis not present

## 2021-12-10 DIAGNOSIS — E876 Hypokalemia: Secondary | ICD-10-CM | POA: Diagnosis not present

## 2021-12-10 DIAGNOSIS — Z7982 Long term (current) use of aspirin: Secondary | ICD-10-CM | POA: Insufficient documentation

## 2021-12-10 DIAGNOSIS — E1165 Type 2 diabetes mellitus with hyperglycemia: Secondary | ICD-10-CM | POA: Insufficient documentation

## 2021-12-10 DIAGNOSIS — Z7984 Long term (current) use of oral hypoglycemic drugs: Secondary | ICD-10-CM | POA: Insufficient documentation

## 2021-12-10 DIAGNOSIS — Z91040 Latex allergy status: Secondary | ICD-10-CM | POA: Diagnosis not present

## 2021-12-10 LAB — URINALYSIS, ROUTINE W REFLEX MICROSCOPIC
Bilirubin Urine: NEGATIVE
Glucose, UA: 100 mg/dL — AB
Hgb urine dipstick: NEGATIVE
Nitrite: NEGATIVE
Protein, ur: 100 mg/dL — AB
Specific Gravity, Urine: 1.034 — ABNORMAL HIGH (ref 1.005–1.030)
WBC, UA: >50 WBC/hpf — ABNORMAL HIGH (ref 0–5)
pH: 5.5 (ref 5.0–8.0)

## 2021-12-10 LAB — BASIC METABOLIC PANEL
Anion gap: 10 (ref 5–15)
BUN: 17 mg/dL (ref 6–20)
CO2: 30 mmol/L (ref 22–32)
Calcium: 9.8 mg/dL (ref 8.9–10.3)
Chloride: 98 mmol/L (ref 98–111)
Creatinine, Ser: 0.93 mg/dL (ref 0.44–1.00)
GFR, Estimated: >60 mL/min (ref >60)
Glucose, Bld: 252 mg/dL — ABNORMAL HIGH (ref 70–99)
Potassium: 3.3 mmol/L — ABNORMAL LOW (ref 3.5–5.1)
Sodium: 138 mmol/L (ref 135–145)

## 2021-12-10 LAB — CBC
HCT: 39.7 % (ref 36.0–46.0)
Hemoglobin: 13.3 g/dL (ref 12.0–15.0)
MCH: 29.2 pg (ref 26.0–34.0)
MCHC: 33.5 g/dL (ref 30.0–36.0)
MCV: 87.3 fL (ref 80.0–100.0)
Platelets: 411 10*3/uL — ABNORMAL HIGH (ref 150–400)
RBC: 4.55 MIL/uL (ref 3.87–5.11)
RDW: 12.2 % (ref 11.5–15.5)
WBC: 9.1 10*3/uL (ref 4.0–10.5)
nRBC: 0 % (ref 0.0–0.2)

## 2021-12-10 MED ORDER — MORPHINE SULFATE (PF) 2 MG/ML IV SOLN
1.0000 mg | Freq: Once | INTRAVENOUS | Status: AC
Start: 2021-12-10 — End: 2021-12-10
  Administered 2021-12-10: 1 mg via INTRAVENOUS
  Filled 2021-12-10: qty 1

## 2021-12-10 MED ORDER — POTASSIUM CHLORIDE CRYS ER 20 MEQ PO TBCR
40.0000 meq | EXTENDED_RELEASE_TABLET | Freq: Once | ORAL | Status: AC
  Administered 2021-12-10: 40 meq via ORAL
  Filled 2021-12-10: qty 2

## 2021-12-10 MED ORDER — CEFADROXIL 500 MG PO CAPS: 500.0000 mg | ORAL_CAPSULE | Freq: Two times a day (BID) | ORAL | 0 refills | Status: DC

## 2021-12-10 MED ORDER — ACETAMINOPHEN 500 MG PO TABS
1000.0000 mg | ORAL_TABLET | Freq: Once | ORAL | Status: AC
  Administered 2021-12-10: 1000 mg via ORAL
  Filled 2021-12-10: qty 2

## 2021-12-10 NOTE — ED Provider Notes (Signed)
[facility] EMERGENCY DEPT Provider Note   CSN: [id number] Arrival date & time: 12/10/21  1026     History  Chief Complaint  Patient presents with   Flank Pain    [name] is a 61 y.o. female with past medical history significant for diabetes, interstitial cystitis, tobacco smoking, history of kidney stones who presents with concern for right-sided back pain, as well as suprapubic pain.  Patient reports that she has had some hesitancy with her urinary stream but denies dysuria, hematuria, urgency.  Patient does endorse some nausea, vomiting last 24 hours.  Patient reports that this does not feel exactly like her previous kidney stones because the pain is less colicky.  Patient also reports recent fusion of the back, recent dry needling, is wondering whether the back pain is related to her musculoskeletal back pain or related to kidney stone versus urinary tract infection.  Patient denies any numbness, tingling of the groin, bilateral legs.   Flank Pain      Home Medications Prior to Admission medications   Medication Sig Start Date End Date Taking? Authorizing Provider  acetaminophen (TYLENOL) 325 MG tablet Take 1-2 tablets (325-650 mg total) by mouth every 6 (six) hours as needed for mild pain (pain score 1-3 or temp > 100.5). 12/27/18   Meredith Pel, MD  amLODipine (NORVASC) 5 MG tablet Take 5 mg by mouth daily.    [provider]  aspirin 81 MG chewable tablet Chew 1 tablet (81 mg total) by mouth 2 (two) times daily. 12/27/18   Meredith Pel, MD  cyclobenzaprine (FLEXERIL) 10 MG tablet Take 1 tablet (10 mg total) by mouth 3 (three) times daily as needed for muscle spasms. 12/09/20   Earnie Larsson, MD  DULoxetine (CYMBALTA) 20 MG capsule Take 20 mg by mouth 2 (two) times daily.    [provider]  ezetimibe (ZETIA) 10 MG tablet Take 10 mg by mouth daily. 10/08/20   [provider]  FLUoxetine (PROZAC) 40 MG capsule Take 1  capsule (40 mg total) by mouth daily. NEEDS APPT FOR REFILLS Patient taking differently: Take 40 mg by mouth daily. 07/10/12   Tanda Rockers, MD  hydrochlorothiazide (HYDRODIURIL) 25 MG tablet Take 1 tablet (25 mg total) by mouth daily. NEEDS APPT FOR REFILLS Patient taking differently: Take 25 mg by mouth daily. 07/10/12   Tanda Rockers, MD  HYDROcodone-acetaminophen (NORCO) 10-325 MG tablet Take 1 tablet by mouth every 4 (four) hours as needed for moderate pain ((score 4 to 6)). 12/09/20   Earnie Larsson, MD  HYDROcodone-acetaminophen (NORCO/VICODIN) 5-325 MG tablet 1 po q 4-6hrs prn pain Patient taking differently: Take 1 tablet by mouth every 4 (four) hours as needed for moderate pain. 01/04/19   Meredith Pel, MD  insulin lispro (HUMALOG) 100 UNIT/ML injection Inject 2 Units into the skin See admin instructions. Inject 2 units SQ up to 6 times daily as needed for BS > 250    [provider]  JARDIANCE 25 MG TABS tablet Take 25 mg by mouth daily. 08/19/18   [provider]  LORazepam (ATIVAN) 1 MG tablet Take 1 mg by mouth at bedtime.    [provider]  pantoprazole (PROTONIX) 40 MG tablet Take 40 mg by mouth daily. 11/17/20   [provider]  Potassium 99 MG TABS Take 99 mg by mouth daily.    [provider]  Semaglutide,0.25 or 0.5MG /DOS, 2 MG/1.5ML SOPN Inject 0.5 mg into the skin every Friday.  [provider]  TRESIBA FLEXTOUCH 100 UNIT/ML SOPN FlexTouch Pen Inject 50 Units into the skin daily.  08/19/18   [provider]      Allergies    Latex, Penicillins, Erythromycin, Ofloxacin, Sulfonamide derivatives, Azithromycin, Fenofibrate, and Statins    Review of Systems   Review of Systems  Genitourinary:  Positive for difficulty urinating and flank pain.  All other systems reviewed and are negative.  Physical Exam Updated Vital Signs BP (!) 129/96 (BP Location: Right Arm)    Pulse 91    Temp 99.1 F (37.3 C)    Resp  16    Ht 5\' 6"  (1.676 m)    Wt 90.3 kg    SpO2 99%    BMI 32.12 kg/m  Physical Exam Vitals and nursing note reviewed.  Constitutional:      General: She is not in acute distress.    Appearance: Normal appearance.     Comments: There is an overall well-appearing patient in no acute distress  HENT:     Head: Normocephalic and atraumatic.  Eyes:     General:        Right eye: No discharge.        Left eye: No discharge.  Cardiovascular:     Rate and Rhythm: Normal rate and regular rhythm.     Heart sounds: No murmur heard.   No friction rub. No gallop.  Pulmonary:     Effort: Pulmonary effort is normal.     Breath sounds: Normal breath sounds.  Abdominal:     General: Bowel sounds are normal.     Palpations: Abdomen is soft.     Comments: Patient with some tenderness to palpation suprapubic region on the right.  She does have some very minimal right CVA tenderness.  She has no rebound, rigidity, guarding throughout.  Skin:    General: Skin is warm and dry.     Capillary Refill: Capillary refill takes less than 2 seconds.  Neurological:     Mental Status: She is alert and oriented to person, place, and time.  Psychiatric:        Mood and Affect: Mood normal.        Behavior: Behavior normal.    ED Results / Procedures / Treatments   Labs (all labs ordered are listed, but only abnormal results are displayed) Labs Reviewed  URINALYSIS, ROUTINE W REFLEX MICROSCOPIC - Abnormal; Notable for the following components:      Result Value   APPearance HAZY (*)    Specific Gravity, Urine 1.034 (*)    Glucose, UA 100 (*)    Ketones, ur TRACE (*)    Protein, ur 100 (*)    Leukocytes,Ua LARGE (*)    WBC, UA >50 (*)    All other components within normal limits  BASIC METABOLIC PANEL - Abnormal; Notable for the following components:   Potassium 3.3 (*)    Glucose, Bld 252 (*)    All other components within normal limits  CBC - Abnormal; Notable for the following components:    Platelets 411 (*)    All other components within normal limits    EKG None  Radiology CT Renal Stone Study  Result Date: 12/10/2021 CLINICAL DATA:  Right flank pain for several days. EXAM: CT ABDOMEN AND PELVIS WITHOUT CONTRAST TECHNIQUE: Multidetector CT imaging of the abdomen and pelvis was performed following the standard protocol without IV contrast. RADIATION DOSE REDUCTION: This exam was performed according to the departmental dose-optimization program  which includes automated exposure control, adjustment of the mA and/or kV according to patient size and/or use of iterative reconstruction technique. COMPARISON:  04/05/2018 FINDINGS: Lower chest: No acute findings. Hepatobiliary: No mass visualized on this unenhanced exam. Prior cholecystectomy. No evidence of biliary obstruction. Pancreas: No mass or inflammatory process visualized on this unenhanced exam. Spleen:  Within normal limits in size. Adrenals/Urinary tract: No evidence of urolithiasis or hydronephrosis. Unremarkable unopacified urinary bladder. Stomach/Bowel: No evidence of obstruction, inflammatory process, or abnormal fluid collections. Vascular/Lymphatic: No pathologically enlarged lymph nodes identified. No evidence of abdominal aortic aneurysm. Aortic atherosclerotic calcification noted. Reproductive: Prior hysterectomy noted. Adnexal regions are unremarkable in appearance. Other:  None. Musculoskeletal: No suspicious bone lesions identified. Previous lumbar spine fusion from levels of L2-L5. Internal fixation screws are also seen across the right sacroiliac joint. IMPRESSION: No evidence of urolithiasis, hydronephrosis, or other acute findings. Electronically Signed   By: Marlaine Hind M.D.   On: 12/10/2021 13:16    Procedures Procedures    Medications Ordered in ED Medications  potassium chloride SA (KLOR-CON M) CR tablet 40 mEq (has no administration in time range)  acetaminophen (TYLENOL) tablet 1,000 mg (has no  administration in time range)  morphine (PF) 2 MG/ML injection 1 mg (has no administration in time range)    ED Course/ Medical Decision Making/ A&P                           Medical Decision Making Amount and/or Complexity of Data Reviewed Labs: ordered. Radiology: ordered.  Risk OTC drugs. Prescription drug management.   61 year old female who presents with right suprapubic, right flank pain.  The emergency parenteral diagnosis includes ectopic pregnancy, ovarian torsion, nephrolithiasis, pyelonephritis, right-sided diverticulitis, appendicitis, versus other acute or intra-abdominal findings.  Patient has significant comorbidities of diabetes, history of kidney stones.  I personally reviewed and interpreted lab work including CBC which is unremarkable other than slightly elevated platelets at 411, fever acute phase reactant.  BMP is significant for very slightly decreased potassium 3.3, we will orally replete.  Patient does have elevated blood sugar at 252.  Patient reports that she did not take her at home insulin prior to arriving in the emergency department today.  Patient denies any confusion, nausea, or worrisome signs of DKA, HHS at this time.  Her urinalysis is significant for ketones, protein, large leukocytes, large white blood cells.  In context of urinary discomfort, suprapubic pain fever acute urinary tract infection versus infected stone.  Patient is afebrile, otherwise well-appearing, low clinical concern for tubo-ovarian abscess, pyelonephritis at this time.  I obtained and personally interpreted CT renal stone study which shows no evidence of kidney stone, or other intra-abdominal pathology.  I agree with radiologist interpretation.  Administered Tylenol, morphine, as well as potassium chloride for potassium.  We will discharge patient with cefadroxil prescription, encourage close follow-up with her PCP.  Patient understands and agrees to plan. Final Clinical  Impression(s) / ED Diagnoses Final diagnoses:  Cystitis    Rx / DC Orders ED Discharge Orders     None         Anselmo Pickler, PA-C 12/10/21 1334    Tegeler, Gwenyth Allegra, MD 12/10/21 1553

## 2021-12-10 NOTE — ED Triage Notes (Signed)
Patient reports for the past couple of days she has been having changes to her urine stream and pain rotating around to her abdomen from her right flank. Patient reports she has a multilevel fusion and she has been getting PT for her back pain. Patient reports she is not sure if this is kidney stone related or back pain related.

## 2021-12-10 NOTE — Discharge Instructions (Addendum)
Please use Tylenol or ibuprofen for pain.  You may use 600 mg ibuprofen every 6 hours or 1000 mg of Tylenol every 6 hours.  You may choose to alternate between the 2.  This would be most effective.  Not to exceed 4 g of Tylenol within 24 hours.  Not to exceed 3200 mg ibuprofen 24 hours.  Please take the entire course of antibiotics that I prescribed.  Recommend taking them on a full stomach, as well as some probiotics.  I recommend following up with your primary care to make sure that your symptoms are resolving over the next few days.  Please return to the emergency department if you have worsening pain despite treatment.

## 2022-02-27 ENCOUNTER — Emergency Department (HOSPITAL_BASED_OUTPATIENT_CLINIC_OR_DEPARTMENT_OTHER)
Admission: EM | Admit: 2022-02-27 | Discharge: 2022-02-27 | Disposition: A | Discharge status: Home / Self Care | Payer: Medicare Other | Attending: Emergency Medicine | Admitting: Emergency Medicine

## 2022-02-27 ENCOUNTER — Encounter (HOSPITAL_BASED_OUTPATIENT_CLINIC_OR_DEPARTMENT_OTHER): Payer: Self-pay | Admitting: Emergency Medicine

## 2022-02-27 ENCOUNTER — Emergency Department (HOSPITAL_BASED_OUTPATIENT_CLINIC_OR_DEPARTMENT_OTHER): Payer: Medicare Other

## 2022-02-27 ENCOUNTER — Other Ambulatory Visit: Payer: Self-pay

## 2022-02-27 DIAGNOSIS — I10 Essential (primary) hypertension: Secondary | ICD-10-CM | POA: Insufficient documentation

## 2022-02-27 DIAGNOSIS — N361 Urethral diverticulum: Secondary | ICD-10-CM | POA: Diagnosis not present

## 2022-02-27 DIAGNOSIS — D72829 Elevated white blood cell count, unspecified: Secondary | ICD-10-CM | POA: Insufficient documentation

## 2022-02-27 DIAGNOSIS — Z794 Long term (current) use of insulin: Secondary | ICD-10-CM | POA: Diagnosis not present

## 2022-02-27 DIAGNOSIS — E119 Type 2 diabetes mellitus without complications: Secondary | ICD-10-CM | POA: Insufficient documentation

## 2022-02-27 DIAGNOSIS — Z79899 Other long term (current) drug therapy: Secondary | ICD-10-CM | POA: Diagnosis not present

## 2022-02-27 DIAGNOSIS — Z91040 Latex allergy status: Secondary | ICD-10-CM | POA: Insufficient documentation

## 2022-02-27 DIAGNOSIS — Z7984 Long term (current) use of oral hypoglycemic drugs: Secondary | ICD-10-CM | POA: Insufficient documentation

## 2022-02-27 DIAGNOSIS — N3001 Acute cystitis with hematuria: Secondary | ICD-10-CM | POA: Diagnosis not present

## 2022-02-27 DIAGNOSIS — N200 Calculus of kidney: Secondary | ICD-10-CM | POA: Insufficient documentation

## 2022-02-27 DIAGNOSIS — Z7982 Long term (current) use of aspirin: Secondary | ICD-10-CM | POA: Diagnosis not present

## 2022-02-27 DIAGNOSIS — E876 Hypokalemia: Secondary | ICD-10-CM | POA: Diagnosis not present

## 2022-02-27 DIAGNOSIS — N12 Tubulo-interstitial nephritis, not specified as acute or chronic: Secondary | ICD-10-CM

## 2022-02-27 DIAGNOSIS — R1031 Right lower quadrant pain: Secondary | ICD-10-CM | POA: Diagnosis present

## 2022-02-27 LAB — URINALYSIS, ROUTINE W REFLEX MICROSCOPIC
Bilirubin Urine: NEGATIVE
Glucose, UA: NEGATIVE mg/dL
Ketones, ur: NEGATIVE mg/dL
Nitrite: NEGATIVE
Protein, ur: >300 mg/dL — AB
RBC / HPF: >50 RBC/hpf — ABNORMAL HIGH (ref 0–5)
Specific Gravity, Urine: 1.033 — ABNORMAL HIGH (ref 1.005–1.030)
WBC, UA: >50 WBC/hpf — ABNORMAL HIGH (ref 0–5)
pH: 5.5 (ref 5.0–8.0)

## 2022-02-27 LAB — CBC WITH DIFFERENTIAL/PLATELET
Abs Immature Granulocytes: 0.03 10*3/uL (ref 0.00–0.07)
Basophils Absolute: 0.1 10*3/uL (ref 0.0–0.1)
Basophils Relative: 1 %
Eosinophils Absolute: 0.4 10*3/uL (ref 0.0–0.5)
Eosinophils Relative: 3 %
HCT: 41.4 % (ref 36.0–46.0)
Hemoglobin: 14 g/dL (ref 12.0–15.0)
Immature Granulocytes: 0 %
Lymphocytes Relative: 22 %
Lymphs Abs: 2.7 10*3/uL (ref 0.7–4.0)
MCH: 29.7 pg (ref 26.0–34.0)
MCHC: 33.8 g/dL (ref 30.0–36.0)
MCV: 87.9 fL (ref 80.0–100.0)
Monocytes Absolute: 0.7 10*3/uL (ref 0.1–1.0)
Monocytes Relative: 6 %
Neutro Abs: 8.5 10*3/uL — ABNORMAL HIGH (ref 1.7–7.7)
Neutrophils Relative %: 68 %
Platelets: 416 10*3/uL — ABNORMAL HIGH (ref 150–400)
RBC: 4.71 MIL/uL (ref 3.87–5.11)
RDW: 12.5 % (ref 11.5–15.5)
WBC: 12.3 10*3/uL — ABNORMAL HIGH (ref 4.0–10.5)
nRBC: 0 % (ref 0.0–0.2)

## 2022-02-27 LAB — BASIC METABOLIC PANEL
Anion gap: 13 (ref 5–15)
BUN: 13 mg/dL (ref 6–20)
CO2: 30 mmol/L (ref 22–32)
Calcium: 10.3 mg/dL (ref 8.9–10.3)
Chloride: 96 mmol/L — ABNORMAL LOW (ref 98–111)
Creatinine, Ser: 0.9 mg/dL (ref 0.44–1.00)
GFR, Estimated: >60 mL/min (ref >60)
Glucose, Bld: 193 mg/dL — ABNORMAL HIGH (ref 70–99)
Potassium: 3.1 mmol/L — ABNORMAL LOW (ref 3.5–5.1)
Sodium: 139 mmol/L (ref 135–145)

## 2022-02-27 LAB — MAGNESIUM: Magnesium: 2 mg/dL (ref 1.7–2.4)

## 2022-02-27 MED ORDER — KETOROLAC TROMETHAMINE 15 MG/ML IJ SOLN
15.0000 mg | Freq: Once | INTRAMUSCULAR | Status: DC
  Filled 2022-02-27: qty 1

## 2022-02-27 MED ORDER — KETOROLAC TROMETHAMINE 15 MG/ML IJ SOLN
15.0000 mg | Freq: Once | INTRAMUSCULAR | Status: AC
  Administered 2022-02-27: 15 mg via INTRAVENOUS
  Filled 2022-02-27: qty 1

## 2022-02-27 MED ORDER — LACTATED RINGERS IV BOLUS
1000.0000 mL | Freq: Once | INTRAVENOUS | Status: AC
  Administered 2022-02-27: 1000 mL via INTRAVENOUS

## 2022-02-27 MED ORDER — FENTANYL CITRATE PF 50 MCG/ML IJ SOSY
50.0000 ug | PREFILLED_SYRINGE | Freq: Once | INTRAMUSCULAR | Status: AC
  Administered 2022-02-27: 50 ug via INTRAVENOUS
  Filled 2022-02-27: qty 1

## 2022-02-27 MED ORDER — IOHEXOL 300 MG/ML  SOLN
100.0000 mL | Freq: Once | INTRAMUSCULAR | Status: AC | PRN
  Administered 2022-02-27: 100 mL via INTRAVENOUS

## 2022-02-27 MED ORDER — POTASSIUM CHLORIDE CRYS ER 20 MEQ PO TBCR
40.0000 meq | EXTENDED_RELEASE_TABLET | Freq: Once | ORAL | Status: AC
  Administered 2022-02-27: 40 meq via ORAL
  Filled 2022-02-27: qty 2

## 2022-02-27 MED ORDER — CEFPODOXIME PROXETIL 200 MG PO TABS: 200.0000 mg | ORAL_TABLET | Freq: Two times a day (BID) | ORAL | 0 refills | Status: AC

## 2022-02-27 MED ORDER — SODIUM CHLORIDE 0.9 % IV SOLN
1.0000 g | Freq: Once | INTRAVENOUS | Status: AC
  Administered 2022-02-27: 1 g via INTRAVENOUS
  Filled 2022-02-27: qty 10

## 2022-02-27 MED ORDER — ONDANSETRON HCL 4 MG/2ML IJ SOLN
4.0000 mg | Freq: Once | INTRAMUSCULAR | Status: AC
  Administered 2022-02-27: 4 mg via INTRAVENOUS
  Filled 2022-02-27: qty 2

## 2022-02-27 NOTE — ED Triage Notes (Signed)
Right flank pain , Hx kidney stone . Hematuria and dysuria  ?

## 2022-02-27 NOTE — ED Provider Notes (Signed)
?[facility] EMERGENCY DEPT ?Provider Note ? ? ?CSN: [id number] ?Arrival date & time: 02/27/22  0865 ? ?  ? ?History ? ?Chief Complaint  ?Patient presents with  ? Flank Pain  ? ? ?[name] is a 61 y.o. female. ? ? ?Flank Pain ?Associated symptoms include abdominal pain.  ? ? 61 year old female with medical history significant for nephrolithiasis, DM2, DDD, GERD, HLD, HTN, interstitial cystitis, who presents to the ED with dysuria, flank pain, hematuria, and increased frequency. Her back pain and side pain feels similar to prior episodes of kidney stones. She also endorses suprapubic discomfort which feels different. Symptoms came on last night and have eased off slightly. She states that she noted she was passing frank blood with small clots in her urine. No fevers of chills. Almost feels as if she is having bladder spasms. States that symptoms of interstitial cystitis have been fine for the past ten years. Doesn't feel like symptoms are similar to prior episodes. Endorses right flank pain, suprapubic pain, and RLQ pain. Her last bowel movement was this morning and was normal. She has a history of hysterectomy and appendectomy.  ? ?Home Medications ?Prior to Admission medications   ?Medication Sig Start Date End Date Taking? Authorizing Provider  ?cefpodoxime (VANTIN) 200 MG tablet Take 1 tablet (200 mg total) by mouth 2 (two) times daily for 10 days. 02/27/22 03/09/22 Yes Regan Lemming, MD  ?acetaminophen (TYLENOL) 325 MG tablet Take 1-2 tablets (325-650 mg total) by mouth every 6 (six) hours as needed for mild pain (pain score 1-3 or temp > 100.5). 12/27/18   Meredith Pel, MD  ?amLODipine (NORVASC) 5 MG tablet Take 5 mg by mouth daily.    [provider]  ?aspirin 81 MG chewable tablet Chew 1 tablet (81 mg total) by mouth 2 (two) times daily. 12/27/18   Meredith Pel, MD  ?cyclobenzaprine (FLEXERIL) 10 MG tablet Take 1 tablet (10 mg total) by mouth 3 (three) times daily as  needed for muscle spasms. 12/09/20   Earnie Larsson, MD  ?DULoxetine (CYMBALTA) 20 MG capsule Take 20 mg by mouth 2 (two) times daily.    [provider]  ?ezetimibe (ZETIA) 10 MG tablet Take 10 mg by mouth daily. 10/08/20   [provider]  ?FLUoxetine (PROZAC) 40 MG capsule Take 1 capsule (40 mg total) by mouth daily. NEEDS APPT FOR REFILLS ?Patient taking differently: Take 40 mg by mouth daily. 07/10/12   Tanda Rockers, MD  ?hydrochlorothiazide (HYDRODIURIL) 25 MG tablet Take 1 tablet (25 mg total) by mouth daily. NEEDS APPT FOR REFILLS ?Patient taking differently: Take 25 mg by mouth daily. 07/10/12   Tanda Rockers, MD  ?HYDROcodone-acetaminophen (NORCO) 10-325 MG tablet Take 1 tablet by mouth every 4 (four) hours as needed for moderate pain ((score 4 to 6)). 12/09/20   Earnie Larsson, MD  ?HYDROcodone-acetaminophen (NORCO/VICODIN) 5-325 MG tablet 1 po q 4-6hrs prn pain ?Patient taking differently: Take 1 tablet by mouth every 4 (four) hours as needed for moderate pain. 01/04/19   Meredith Pel, MD  ?insulin lispro (HUMALOG) 100 UNIT/ML injection Inject 2 Units into the skin See admin instructions. Inject 2 units SQ up to 6 times daily as needed for BS > 250    [provider]  ?JARDIANCE 25 MG TABS tablet Take 25 mg by mouth daily. 08/19/18   [provider]  ?LORazepam (ATIVAN) 1 MG tablet Take 1 mg by mouth at bedtime.    [provider]  ?pantoprazole (PROTONIX) 40 MG tablet Take 40 mg by mouth daily. 11/17/20   [provider]  ?Potassium 99 MG TABS Take 99 mg by mouth daily.    [provider]  ?Semaglutide,0.25 or 0.'5MG'$ /DOS, 2 MG/1.5ML SOPN Inject 0.5 mg into the skin every Friday.     [provider]  ?TRESIBA FLEXTOUCH 100 UNIT/ML SOPN FlexTouch Pen Inject 50 Units into the skin daily.  08/19/18   [provider]  ?   ? ?Allergies    ?Latex, Penicillins, Erythromycin, Ofloxacin, Sulfonamide derivatives, Azithromycin, Fenofibrate,  and Statins   ? ?Review of Systems   ?Review of Systems  ?Gastrointestinal:  Positive for abdominal pain.  ?Genitourinary:  Positive for dysuria, flank pain and hematuria.  ?All other systems reviewed and are negative. ? ?Physical Exam ?Updated Vital Signs ?BP (!) 141/72 (BP Location: Right Arm)   Pulse 66   Temp 97.8 ?F (36.6 ?C)   Resp 18   SpO2 97%  ?Physical Exam ?Vitals and nursing note reviewed.  ?Constitutional:   ?   General: She is not in acute distress. ?   Appearance: She is well-developed.  ?HENT:  ?   Head: Normocephalic and atraumatic.  ?Eyes:  ?   Conjunctiva/sclera: Conjunctivae normal.  ?Cardiovascular:  ?   Rate and Rhythm: Normal rate and regular rhythm.  ?Pulmonary:  ?   Effort: Pulmonary effort is normal. No respiratory distress.  ?   Breath sounds: Normal breath sounds.  ?Abdominal:  ?   Palpations: Abdomen is soft.  ?   Tenderness: There is abdominal tenderness in the suprapubic area. There is right CVA tenderness. There is no left CVA tenderness, guarding or rebound.  ?Musculoskeletal:     ?   General: No swelling.  ?   Cervical back: Neck supple.  ?Skin: ?   General: Skin is warm and dry.  ?   Capillary Refill: Capillary refill takes less than 2 seconds.  ?Neurological:  ?   Mental Status: She is alert.  ?Psychiatric:     ?   Mood and Affect: Mood normal.  ? ? ?ED Results / Procedures / Treatments   ?Labs ?(all labs ordered are listed, but only abnormal results are displayed) ?Labs Reviewed  ?CBC WITH DIFFERENTIAL/PLATELET - Abnormal; Notable for the following components:  ?    Result Value  ? WBC 12.3 (*)   ? Platelets 416 (*)   ? Neutro Abs 8.5 (*)   ? All other components within normal limits  ?BASIC METABOLIC PANEL - Abnormal; Notable for the following components:  ? Potassium 3.1 (*)   ? Chloride 96 (*)   ? Glucose, Bld 193 (*)   ? All other components within normal limits  ?URINALYSIS, ROUTINE W REFLEX MICROSCOPIC - Abnormal; Notable for the following components:  ? Color, Urine  BROWN (*)   ? APPearance CLOUDY (*)   ? Specific Gravity, Urine 1.033 (*)   ? Hgb urine dipstick LARGE (*)   ? Protein, ur >300 (*)   ? Leukocytes,Ua LARGE (*)   ? RBC / HPF >50 (*)   ? WBC, UA >50 (*)   ? Bacteria, UA RARE (*)   ? Non Squamous Epithelial 0-5 (*)   ? All other components within normal limits  ?URINE CULTURE  ?MAGNESIUM  ? ? ?EKG ?None ? ?Radiology ?CT ABDOMEN PELVIS W CONTRAST ? ?Result Date: 02/27/2022 ?CLINICAL DATA:  Flank pain, stone suspected in a 61 year old female. EXAM: CT ABDOMEN AND PELVIS WITH  CONTRAST TECHNIQUE: Multidetector CT imaging of the abdomen and pelvis was performed using the standard protocol following bolus administration of intravenous contrast. RADIATION DOSE REDUCTION: This exam was performed according to the departmental dose-optimization program which includes automated exposure control, adjustment of the mA and/or kV according to patient size and/or use of iterative reconstruction technique. CONTRAST:  151m OMNIPAQUE IOHEXOL 300 MG/ML  SOLN COMPARISON:  Comparison is made with December 10, 2021. FINDINGS: Lower chest: Minimal basilar atelectasis. No effusion. No consolidative changes. Hepatobiliary: Smooth hepatic contours. Post cholecystectomy with signs of hepatic steatosis. Mild dilation of the biliary tree, common bile duct dilated up to 9-10 mm, previously 8-9 mm mm in February of 2023. Mild intrahepatic biliary duct distension slightly more prominent potentially but present on prior imaging. No focal, suspicious hepatic lesion. Portal vein is patent. Pancreas: Normal, without mass, inflammation or ductal dilatation. Spleen: Normal. Adrenals/Urinary Tract: Adrenal glands are normal. Symmetric renal enhancement without hydronephrosis. Urinary bladder with smooth contours. Urinary bladder is under distended. Mild perivesical stranding. No suspicious renal lesion or hydronephrosis. a enhancing peripherally within the area of the periurethral/perivaginal soft tissues  (image 92/2) is an eccentric crescentic area that has an appearance raising the question of urethral diverticulum best seen on coronal image 44/5. Not well evaluated on CT. Stomach/Bowel: Post appendectomy. N

## 2022-02-27 NOTE — Discharge Instructions (Addendum)
You were evaluated in the Emergency Department and after careful evaluation, we did not find any emergent condition requiring admission or further testing in the hospital. ? ?Your exam/testing today was concerning for a urinary tract infection and possible developing kidney infection.  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  Take antibiotics for the next 10 days to treat this infection. Continue to take your outpatient prescribed pain medications.  ? ?Please return to the Emergency Department if you experience any worsening of your condition.  Thank you for allowing [place] to be a part of your care. ? ?

## 2022-03-01 LAB — URINE CULTURE: Culture: >=100000 — AB

## 2022-03-02 NOTE — Progress Notes (Signed)
ED Antimicrobial Stewardship Positive Culture Follow Up  ? ?[name] is an 61 y.o. female who presented to [facility] on 02/27/2022 with a chief complaint of  ?Chief Complaint  ?Patient presents with  ? Flank Pain  ? ? ?Recent Results (from the past 720 hour(s))  ?Urine Culture     Status: Abnormal  ? Collection Time: 02/27/22  8:14 AM  ? Specimen: Urine, Clean Catch  ?Result Value Ref Range Status  ? Specimen Description   Final  ?  URINE, CLEAN CATCH ?Performed at [facility], [address] [place], [place] [address] ?  ? Special Requests   Final  ?  NONE ?Performed at [facility], [address], [place],  [address] ?  ? Culture >=100,000 COLONIES/mL ESCHERICHIA COLI (A)  Final  ? Report Status 03/01/2022 FINAL  Final  ? Organism ID, Bacteria ESCHERICHIA COLI (A)  Final  ?    Susceptibility  ? Escherichia coli - MIC*  ?  AMPICILLIN >=32 RESISTANT Resistant   ?  CEFAZOLIN >=64 RESISTANT Resistant   ?  CEFEPIME <=0.12 SENSITIVE Sensitive   ?  CEFTRIAXONE 32 RESISTANT Resistant   ?  CIPROFLOXACIN <=0.25 SENSITIVE Sensitive   ?  GENTAMICIN <=1 SENSITIVE Sensitive   ?  IMIPENEM <=0.25 SENSITIVE Sensitive   ?  NITROFURANTOIN <=16 SENSITIVE Sensitive   ?  TRIMETH/SULFA <=20 SENSITIVE Sensitive   ?  AMPICILLIN/SULBACTAM >=32 RESISTANT Resistant   ?  PIP/TAZO 8 SENSITIVE Sensitive   ?  * >=100,000 COLONIES/mL ESCHERICHIA COLI  ? ? ?'[x]'$  Treated with cefpodoxime 200 mg BID x 10 days, organism resistant to prescribed antimicrobial ?'[]'$  Patient discharged originally without antimicrobial agent and treatment is now indicated ? ?Call patient to assess if symptoms have resolved (flank pain, dysuria, hematuria). Also can clarify antibiotic allergies. Records show she has previously tolerated levofloxacin, but will need to clarify with patient if tolerated fluoroquinolones before sending new rx in.  ? ?If still symptomatic, stop cefpodoxime and start ?New antibiotic  prescription: ciprofloxacin 500 mg PO BID x 7 days ? ?ED Provider: Isla Pence, MD ? ?Thank you for involving pharmacy in this patient's care. ? ?Elita Quick, PharmD ?PGY1 Ambulatory Care Pharmacy Resident ?03/02/2022 10:01 AM ?Clinical Pharmacist ?Monday - Friday phone -  [phone] ?Saturday - Sunday phone - [phone] ? ?

## 2022-03-07 ENCOUNTER — Other Ambulatory Visit: Payer: Self-pay | Admitting: Urology

## 2022-03-07 DIAGNOSIS — N361 Urethral diverticulum: Secondary | ICD-10-CM

## 2022-03-13 ENCOUNTER — Ambulatory Visit
Admission: RE | Admit: 2022-03-13 | Discharge: 2022-03-13 | Disposition: A | Discharge status: Home / Self Care | Payer: Medicare Other | Source: Ambulatory Visit | Attending: Urology | Admitting: Urology

## 2022-03-13 DIAGNOSIS — N361 Urethral diverticulum: Secondary | ICD-10-CM

## 2022-03-13 MED ORDER — GADOBENATE DIMEGLUMINE 529 MG/ML IV SOLN
18.0000 mL | Freq: Once | INTRAVENOUS | Status: AC | PRN
  Administered 2022-03-13: 18 mL via INTRAVENOUS

## 2022-05-16 ENCOUNTER — Emergency Department (HOSPITAL_COMMUNITY): Payer: Medicare Other

## 2022-05-16 ENCOUNTER — Other Ambulatory Visit: Payer: Self-pay

## 2022-05-16 ENCOUNTER — Inpatient Hospital Stay (HOSPITAL_COMMUNITY)
Admission: EM | Admit: 2022-05-16 | Discharge: 2022-05-19 | DRG: 394 | Disposition: A | Discharge status: Home / Self Care | Payer: Medicare Other | Attending: Internal Medicine | Admitting: Internal Medicine

## 2022-05-16 ENCOUNTER — Encounter (HOSPITAL_COMMUNITY): Payer: Self-pay

## 2022-05-16 DIAGNOSIS — K558 Other vascular disorders of intestine: Principal | ICD-10-CM | POA: Diagnosis present

## 2022-05-16 DIAGNOSIS — K2270 Barrett's esophagus without dysplasia: Secondary | ICD-10-CM | POA: Diagnosis not present

## 2022-05-16 DIAGNOSIS — K5730 Diverticulosis of large intestine without perforation or abscess without bleeding: Secondary | ICD-10-CM | POA: Diagnosis present

## 2022-05-16 DIAGNOSIS — Z981 Arthrodesis status: Secondary | ICD-10-CM

## 2022-05-16 DIAGNOSIS — K922 Gastrointestinal hemorrhage, unspecified: Secondary | ICD-10-CM | POA: Diagnosis present

## 2022-05-16 DIAGNOSIS — Z794 Long term (current) use of insulin: Secondary | ICD-10-CM

## 2022-05-16 DIAGNOSIS — N361 Urethral diverticulum: Secondary | ICD-10-CM

## 2022-05-16 DIAGNOSIS — Z87442 Personal history of urinary calculi: Secondary | ICD-10-CM

## 2022-05-16 DIAGNOSIS — I10 Essential (primary) hypertension: Secondary | ICD-10-CM | POA: Diagnosis present

## 2022-05-16 DIAGNOSIS — M1990 Unspecified osteoarthritis, unspecified site: Secondary | ICD-10-CM | POA: Diagnosis present

## 2022-05-16 DIAGNOSIS — E785 Hyperlipidemia, unspecified: Secondary | ICD-10-CM | POA: Diagnosis present

## 2022-05-16 DIAGNOSIS — E871 Hypo-osmolality and hyponatremia: Secondary | ICD-10-CM | POA: Diagnosis present

## 2022-05-16 DIAGNOSIS — Z8249 Family history of ischemic heart disease and other diseases of the circulatory system: Secondary | ICD-10-CM

## 2022-05-16 DIAGNOSIS — E1165 Type 2 diabetes mellitus with hyperglycemia: Secondary | ICD-10-CM | POA: Diagnosis present

## 2022-05-16 DIAGNOSIS — Z6831 Body mass index (BMI) 31.0-31.9, adult: Secondary | ICD-10-CM

## 2022-05-16 DIAGNOSIS — R55 Syncope and collapse: Secondary | ICD-10-CM | POA: Diagnosis present

## 2022-05-16 DIAGNOSIS — E119 Type 2 diabetes mellitus without complications: Secondary | ICD-10-CM | POA: Diagnosis not present

## 2022-05-16 DIAGNOSIS — K921 Melena: Secondary | ICD-10-CM | POA: Diagnosis not present

## 2022-05-16 DIAGNOSIS — Z90710 Acquired absence of both cervix and uterus: Secondary | ICD-10-CM

## 2022-05-16 DIAGNOSIS — Z96651 Presence of right artificial knee joint: Secondary | ICD-10-CM | POA: Diagnosis present

## 2022-05-16 DIAGNOSIS — F419 Anxiety disorder, unspecified: Secondary | ICD-10-CM | POA: Diagnosis present

## 2022-05-16 DIAGNOSIS — E669 Obesity, unspecified: Secondary | ICD-10-CM | POA: Diagnosis present

## 2022-05-16 DIAGNOSIS — F32A Depression, unspecified: Secondary | ICD-10-CM | POA: Diagnosis present

## 2022-05-16 DIAGNOSIS — Z86010 Personal history of colonic polyps: Secondary | ICD-10-CM

## 2022-05-16 DIAGNOSIS — Z9049 Acquired absence of other specified parts of digestive tract: Secondary | ICD-10-CM

## 2022-05-16 DIAGNOSIS — Z79899 Other long term (current) drug therapy: Secondary | ICD-10-CM

## 2022-05-16 DIAGNOSIS — N3010 Interstitial cystitis (chronic) without hematuria: Secondary | ICD-10-CM | POA: Diagnosis not present

## 2022-05-16 DIAGNOSIS — Z87891 Personal history of nicotine dependence: Secondary | ICD-10-CM

## 2022-05-16 DIAGNOSIS — K219 Gastro-esophageal reflux disease without esophagitis: Secondary | ICD-10-CM | POA: Diagnosis present

## 2022-05-16 DIAGNOSIS — E860 Dehydration: Secondary | ICD-10-CM | POA: Diagnosis present

## 2022-05-16 DIAGNOSIS — R1084 Generalized abdominal pain: Secondary | ICD-10-CM

## 2022-05-16 DIAGNOSIS — G8929 Other chronic pain: Secondary | ICD-10-CM | POA: Diagnosis present

## 2022-05-16 DIAGNOSIS — Z8371 Family history of colonic polyps: Secondary | ICD-10-CM

## 2022-05-16 DIAGNOSIS — E876 Hypokalemia: Secondary | ICD-10-CM | POA: Diagnosis present

## 2022-05-16 DIAGNOSIS — Z825 Family history of asthma and other chronic lower respiratory diseases: Secondary | ICD-10-CM

## 2022-05-16 DIAGNOSIS — K633 Ulcer of intestine: Secondary | ICD-10-CM | POA: Diagnosis present

## 2022-05-16 DIAGNOSIS — K625 Hemorrhage of anus and rectum: Secondary | ICD-10-CM | POA: Diagnosis not present

## 2022-05-16 DIAGNOSIS — K648 Other hemorrhoids: Secondary | ICD-10-CM | POA: Diagnosis present

## 2022-05-16 DIAGNOSIS — Z85828 Personal history of other malignant neoplasm of skin: Secondary | ICD-10-CM

## 2022-05-16 DIAGNOSIS — M549 Dorsalgia, unspecified: Secondary | ICD-10-CM | POA: Diagnosis present

## 2022-05-16 LAB — URINALYSIS, ROUTINE W REFLEX MICROSCOPIC
Bilirubin Urine: NEGATIVE
Glucose, UA: 150 mg/dL — AB
Hgb urine dipstick: NEGATIVE
Ketones, ur: 5 mg/dL — AB
Leukocytes,Ua: NEGATIVE
Nitrite: NEGATIVE
Protein, ur: 100 mg/dL — AB
Specific Gravity, Urine: 1.023 (ref 1.005–1.030)
pH: 5 (ref 5.0–8.0)

## 2022-05-16 LAB — POC OCCULT BLOOD, ED: Fecal Occult Bld: POSITIVE — AB

## 2022-05-16 LAB — CBC
HCT: 42.7 % (ref 36.0–46.0)
Hemoglobin: 14.9 g/dL (ref 12.0–15.0)
MCH: 31.1 pg (ref 26.0–34.0)
MCHC: 34.9 g/dL (ref 30.0–36.0)
MCV: 89.1 fL (ref 80.0–100.0)
Platelets: 391 10*3/uL (ref 150–400)
RBC: 4.79 MIL/uL (ref 3.87–5.11)
RDW: 12.7 % (ref 11.5–15.5)
WBC: 11.3 10*3/uL — ABNORMAL HIGH (ref 4.0–10.5)
nRBC: 0 % (ref 0.0–0.2)

## 2022-05-16 LAB — COMPREHENSIVE METABOLIC PANEL
ALT: 32 U/L (ref 0–44)
AST: 29 U/L (ref 15–41)
Albumin: 4.2 g/dL (ref 3.5–5.0)
Alkaline Phosphatase: 74 U/L (ref 38–126)
Anion gap: 12 (ref 5–15)
BUN: 14 mg/dL (ref 6–20)
CO2: 24 mmol/L (ref 22–32)
Calcium: 9.5 mg/dL (ref 8.9–10.3)
Chloride: 98 mmol/L (ref 98–111)
Creatinine, Ser: 0.82 mg/dL (ref 0.44–1.00)
GFR, Estimated: >60 mL/min (ref >60)
Glucose, Bld: 199 mg/dL — ABNORMAL HIGH (ref 70–99)
Potassium: 3 mmol/L — ABNORMAL LOW (ref 3.5–5.1)
Sodium: 134 mmol/L — ABNORMAL LOW (ref 135–145)
Total Bilirubin: 0.9 mg/dL (ref 0.3–1.2)
Total Protein: 7.7 g/dL (ref 6.5–8.1)

## 2022-05-16 LAB — TYPE AND SCREEN
ABO/RH(D): O POS
Antibody Screen: NEGATIVE

## 2022-05-16 LAB — LIPASE, BLOOD: Lipase: 27 U/L (ref 11–51)

## 2022-05-16 LAB — GLUCOSE, CAPILLARY: Glucose-Capillary: 189 mg/dL — ABNORMAL HIGH (ref 70–99)

## 2022-05-16 MED ORDER — FLUOXETINE HCL 20 MG PO CAPS
40.0000 mg | ORAL_CAPSULE | Freq: Every day | ORAL | Status: DC
  Administered 2022-05-16 – 2022-05-18 (×3): 40 mg via ORAL
  Filled 2022-05-16 (×5): qty 2

## 2022-05-16 MED ORDER — ACETAMINOPHEN 325 MG PO TABS
650.0000 mg | ORAL_TABLET | Freq: Once | ORAL | Status: AC
  Administered 2022-05-16: 650 mg via ORAL
  Filled 2022-05-16: qty 2

## 2022-05-16 MED ORDER — DULOXETINE HCL 20 MG PO CPEP
20.0000 mg | ORAL_CAPSULE | Freq: Every day | ORAL | Status: DC
Start: 2022-05-16 — End: 2022-05-19
  Administered 2022-05-16 – 2022-05-18 (×3): 20 mg via ORAL
  Filled 2022-05-16 (×5): qty 1

## 2022-05-16 MED ORDER — EZETIMIBE 10 MG PO TABS
10.0000 mg | ORAL_TABLET | Freq: Every day | ORAL | Status: DC
  Administered 2022-05-16 – 2022-05-18 (×3): 10 mg via ORAL
  Filled 2022-05-16 (×5): qty 1

## 2022-05-16 MED ORDER — OXYCODONE HCL 5 MG PO TABS: 5.0000 mg | ORAL_TABLET | Freq: Four times a day (QID) | ORAL | Status: DC | PRN

## 2022-05-16 MED ORDER — POTASSIUM CHLORIDE 20 MEQ PO PACK
20.0000 meq | PACK | Freq: Once | ORAL | Status: AC
Start: 2022-05-16 — End: 2022-05-16
  Administered 2022-05-16: 20 meq via ORAL
  Filled 2022-05-16: qty 1

## 2022-05-16 MED ORDER — ONDANSETRON HCL 4 MG/2ML IJ SOLN: 4.0000 mg | Freq: Four times a day (QID) | INTRAMUSCULAR | Status: DC | PRN

## 2022-05-16 MED ORDER — ONDANSETRON HCL 4 MG PO TABS
4.0000 mg | ORAL_TABLET | Freq: Four times a day (QID) | ORAL | Status: DC | PRN
  Administered 2022-05-18: 4 mg via ORAL
  Filled 2022-05-16: qty 1

## 2022-05-16 MED ORDER — SODIUM CHLORIDE 0.9 % IV BOLUS
1000.0000 mL | Freq: Once | INTRAVENOUS | Status: AC
  Administered 2022-05-16: 1000 mL via INTRAVENOUS

## 2022-05-16 MED ORDER — PANTOPRAZOLE SODIUM 40 MG IV SOLR
40.0000 mg | Freq: Once | INTRAVENOUS | Status: AC
  Administered 2022-05-16: 40 mg via INTRAVENOUS
  Filled 2022-05-16: qty 10

## 2022-05-16 MED ORDER — HYDROCODONE-ACETAMINOPHEN 5-325 MG PO TABS
1.0000 | ORAL_TABLET | ORAL | Status: DC | PRN
  Administered 2022-05-16 – 2022-05-18 (×9): 1 via ORAL
  Filled 2022-05-16 (×9): qty 1

## 2022-05-16 MED ORDER — METRONIDAZOLE 500 MG/100ML IV SOLN: 500.0000 mg | Freq: Once | INTRAVENOUS | Status: DC

## 2022-05-16 MED ORDER — INSULIN ASPART 100 UNIT/ML IJ SOLN
0.0000 [IU] | Freq: Three times a day (TID) | INTRAMUSCULAR | Status: DC
  Administered 2022-05-17: 3 [IU] via SUBCUTANEOUS
  Administered 2022-05-17: 5 [IU] via SUBCUTANEOUS
  Administered 2022-05-18: 8 [IU] via SUBCUTANEOUS
  Administered 2022-05-18: 3 [IU] via SUBCUTANEOUS
  Administered 2022-05-19: 2 [IU] via SUBCUTANEOUS
  Administered 2022-05-19: 3 [IU] via SUBCUTANEOUS

## 2022-05-16 MED ORDER — AMLODIPINE BESYLATE 5 MG PO TABS
5.0000 mg | ORAL_TABLET | Freq: Every day | ORAL | Status: DC
  Administered 2022-05-16 – 2022-05-18 (×3): 5 mg via ORAL
  Filled 2022-05-16 (×5): qty 1

## 2022-05-16 MED ORDER — IOHEXOL 300 MG/ML  SOLN
100.0000 mL | Freq: Once | INTRAMUSCULAR | Status: AC | PRN
Start: 2022-05-16 — End: 2022-05-16
  Administered 2022-05-16: 100 mL via INTRAVENOUS

## 2022-05-16 MED ORDER — POTASSIUM CHLORIDE CRYS ER 20 MEQ PO TBCR
40.0000 meq | EXTENDED_RELEASE_TABLET | Freq: Once | ORAL | Status: AC
  Administered 2022-05-16: 40 meq via ORAL
  Filled 2022-05-16: qty 2

## 2022-05-16 MED ORDER — MORPHINE SULFATE (PF) 4 MG/ML IV SOLN
4.0000 mg | Freq: Once | INTRAVENOUS | Status: AC
  Administered 2022-05-16: 4 mg via INTRAVENOUS
  Filled 2022-05-16: qty 1

## 2022-05-16 MED ORDER — SODIUM CHLORIDE 0.9 % IV SOLN
2.0000 g | Freq: Once | INTRAVENOUS | Status: DC
  Administered 2022-05-16: 2 g via INTRAVENOUS
  Filled 2022-05-16: qty 12.5

## 2022-05-16 MED ORDER — PANTOPRAZOLE SODIUM 40 MG PO TBEC
40.0000 mg | DELAYED_RELEASE_TABLET | Freq: Two times a day (BID) | ORAL | Status: DC
  Administered 2022-05-17 – 2022-05-18 (×4): 40 mg via ORAL
  Filled 2022-05-16 (×5): qty 1

## 2022-05-16 MED ORDER — INSULIN ASPART 100 UNIT/ML IJ SOLN
0.0000 [IU] | Freq: Every day | INTRAMUSCULAR | Status: DC
  Administered 2022-05-18: 2 [IU] via SUBCUTANEOUS

## 2022-05-16 NOTE — ED Provider Triage Note (Signed)
Emergency Medicine Provider Triage Evaluation Note  [name] , a 61 y.o. female  was evaluated in triage.  Pt complains of abdominal pain, nausea, and diarrhea and rectal bleeding. States that this morning she has had 5 stools with 'all bright red blood with clots.' No hx of similar, she is not anticoagulated. Also endorses pain throughout her abdomen that does not radiate. States when she got up to have the last bloody bowel movement she had a syncopal episode with prodrome of diaphoresis. No injuries from this. Had recent colonoscopy that showed polyps.  Review of Systems  Positive:  Negative:   Physical Exam  BP (!) 140/91 (BP Location: Left Arm)   Pulse 87   Temp 98.8 F (37.1 C) (Oral)   Resp 16   Ht '5\' 6"'$  (1.676 m)   Wt 89.4 kg   SpO2 98%   BMI 31.80 kg/m  Gen:   Awake, no distress   Resp:  Normal effort  MSK:   Moves extremities without difficulty  Other:    Medical Decision Making  Medically screening exam initiated at 11:39 AM.  Appropriate orders placed.  [name] was informed that the remainder of the evaluation will be completed by another provider, this initial triage assessment does not replace that evaluation, and the importance of remaining in the ED until their evaluation is complete.     Bud Face, PA-C 05/16/22 1141

## 2022-05-16 NOTE — Assessment & Plan Note (Signed)
-   Continue fluoxetine and dulotetine

## 2022-05-16 NOTE — Assessment & Plan Note (Addendum)
-   Given IV fluids, trend BMP tomorrow - Hold HCTZ

## 2022-05-16 NOTE — Assessment & Plan Note (Signed)
-   Continue home amlodipine - Hold HCTZ until hemodynamics clear

## 2022-05-16 NOTE — Assessment & Plan Note (Addendum)
-   Supplement potassium - Hold HCTZ - Check mag

## 2022-05-16 NOTE — Assessment & Plan Note (Addendum)
Patient clinically has a colitis with crampy abdominal pain, frequent painful stools, hematochezia.  Differential probably favors infectious colitis, no real risk factors for ischemia.  No prior history of IBD. Less likely cancer or diverticular bleed.  \  Patient wonders if her recent antibiotics could influence her diarrhea, although I do not see how this would cause hematochezia unless it were just hemorrhoids. - Hold antibiotics - GI pathogen panel and C. difficile - Consult gastroenterology, appreciate recommendations - Hold aspirin

## 2022-05-16 NOTE — H&P (Signed)
History and Physical    Patient: [name] LTJ:[id number] DOB: 23-May-1961 DOA: 05/16/2022 DOS: the patient was seen and examined on 05/16/2022 PCP: Ardith Dark, PA-C  Patient coming from: Home  Chief Complaint:  Chief Complaint  Patient presents with   GI Bleeding   Loss of Consciousness       HPI:  [name] is a 61 y.o. 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  Patient was in her usual state of health until 3 days ago, her antibiotics were switched to cephalexin.  The next day she had abdominal cramps, anorexia, malaise and nausea, and then that night had an episode of severe nausea and vomiting, voluminous soft bowel movement with streaks of blood, then crampy colicky abdominal pain.  Then overnight last night, she had the urge to defecate and the toilet bowl was full of "all blood", no stool.  She had another grossly bloody bowel movement after that, and then went to her PCP who sent her to the ER.  In the ER, heart rate and blood pressure normal.  Hemoglobin 14.9 from baseline 13.5.  Abdomen and pelvis showed possible wall thickening of the descending colon.  She was given fluids and antibiotics and the hospital service were asked to evaluate for hematochezia.      Review of Systems  Constitutional:  Positive for chills (and sweats). Negative for fever and malaise/fatigue.  Gastrointestinal:  Positive for abdominal pain, blood in stool, diarrhea, nausea and vomiting. Negative for constipation, heartburn and melena.  All other systems reviewed and are negative.    Past Medical History:  Diagnosis Date   Anxiety    Barrett esophagus    Dr. Olevia Perches in GI   Cancer [facility])    Skin Cancer   Chronic back pain    stenosis and DDD   DDD (degenerative disc disease)    s/p back surgeries and steroid injxns   Depression     takes Fluoxetine daily   Diabetes mellitus    Victoza daily   GERD (gastroesophageal reflux disease)    takes Nexium daily   History of kidney stones    Hyperlipidemia    hasn't been on Pravastatin in 6-7wks    Hypertension    takes Amlodipine and HCTZ daily   Interstitial cystitis    Joint pain    Joint swelling    Muscle spasm    takes Ativan nightly as needed   Numbness    weakness in right leg   Pneumonia    fall 2014   PONV (postoperative nausea and vomiting)    Spinal headache    blood patch required    Urinary frequency    Past Surgical History:  Procedure Laterality Date   adhesions removed from abdomen     APPENDECTOMY     BACK SURGERY  12/2007   x 6-fusion x 2   CARDIAC CATHETERIZATION  2013   CESAREAN SECTION  1988/1989   CHOLECYSTECTOMY     COLONOSCOPY  2011   epidural injections     KNEE ARTHROSCOPY Bilateral    LAPAROSCOPIC LYSIS OF ADHESIONS N/A 04/15/2014   Procedure: LAPAROSCOPIC LYSIS OF ADHESION;  Surgeon: Marcello Moores A. Cornett, MD;  Location: [place];  Service: General;  Laterality: N/A;   LAPAROSCOPY N/A 04/15/2014   Procedure: LAPAROSCOPY DIAGNOSTIC;  Surgeon: Joyice Faster. Cornett, MD;  Location: [place];  Service: General;  Laterality: N/A;   LEFT HEART CATHETERIZATION WITH CORONARY ANGIOGRAM N/A 02/03/2012   Procedure: LEFT HEART CATHETERIZATION WITH CORONARY ANGIOGRAM;  Surgeon: Troy Sine, MD;  Location[facility] CATH LAB;  Service: Cardiovascular;  Laterality: N/A;   LUMBAR FUSION Right 03/24/2015   Procedure: SI joint fusion - right ;  Surgeon: Karie Chimera, MD;  Location: [place] NEURO ORS;  Service: Neurosurgery;  Laterality: Right;  SI joint fusion - right    NECK SURGERY  12/2007   OOPHORECTOMY     TOTAL ABDOMINAL HYSTERECTOMY     TOTAL KNEE ARTHROPLASTY Right 12/25/2018   TOTAL KNEE ARTHROPLASTY Right 12/25/2018   Procedure: RIGHT TOTAL KNEE ARTHROPLASTY;  Surgeon: Meredith Pel, MD;  Location: [place];  Service: Orthopedics;  Laterality: Right;   upper  gi endoscopy     Social History:  reports that she has quit smoking. Her smoking use included cigarettes. She has a 5.10 pack-year smoking history. She has never used smokeless tobacco. She reports current alcohol use. She reports that she does not use drugs.  Allergies  Allergen Reactions   Latex Anaphylaxis   Penicillins Anaphylaxis and Other (See Comments)    Has patient had a PCN reaction causing immediate rash, facial/tongue/throat swelling, SOB or lightheadedness with hypotension: No Has patient had a PCN reaction causing severe rash involving mucus membranes or skin necrosis: No Has patient had a PCN reaction that required hospitalization No Has patient had a PCN reaction occurring within the last 10 years: No If all of the above answers are "NO", then may proceed with Cephalosporin use.   Erythromycin Hives and Itching   Ofloxacin Hives and Itching   Sulfonamide Derivatives Hives and Itching   Azithromycin Other (See Comments)    "makes my heart rate go crazy"   Fenofibrate Other (See Comments)    Cramps   Statins Nausea And Vomiting    Cramps    Family History  Problem Relation Age of Onset   Heart disease Mother        ischemic   Gallbladder disease Mother    Other Mother        rectovaginal fistula   Hypertension Father    Colon polyps Father    Allergies Sister    Asthma Sister     Prior to Admission medications   Medication Sig Start Date End Date Taking? Authorizing Provider  amLODipine (NORVASC) 5 MG tablet Take 5 mg by mouth daily.   Yes [provider]  cephALEXin (KEFLEX) 500 MG capsule Take 500 mg by mouth 3 (three) times daily. 05/11/22  Yes [provider]  cyclobenzaprine (FLEXERIL) 10 MG tablet Take 1 tablet (10 mg total) by mouth 3 (three) times daily as needed for muscle spasms. 12/09/20  Yes Pool, Mallie Mussel, MD  DULoxetine (CYMBALTA) 20 MG capsule Take 20 mg by mouth daily.   Yes [provider]  ezetimibe (ZETIA) 10 MG tablet  Take 10 mg by mouth daily. 10/08/20  Yes [provider]  FLUoxetine (PROZAC) 40 MG capsule Take 1 capsule (40 mg total) by mouth daily. NEEDS APPT FOR REFILLS Patient taking differently: Take 40 mg by mouth daily. 07/10/12  Yes Tanda Rockers, MD  hydrochlorothiazide (HYDRODIURIL) 25 MG tablet Take 1 tablet (25 mg total) by mouth daily. NEEDS APPT FOR REFILLS Patient taking differently: Take 25 mg by mouth daily. 07/10/12  Yes Tanda Rockers, MD  HYDROcodone-acetaminophen (NORCO/VICODIN) 5-325 MG tablet 1 po q 4-6hrs prn pain Patient taking differently: Take 1 tablet  by mouth every 4 (four) hours as needed for moderate pain. 01/04/19  Yes Meredith Pel, MD  insulin lispro (HUMALOG) 100 UNIT/ML injection Inject 2 Units into the skin See admin instructions. Inject 2 units SQ up to 6 times daily as needed for BS > 250   Yes [provider]  nitrofurantoin, macrocrystal-monohydrate, (MACROBID) 100 MG capsule Take 100 mg by mouth daily. 05/05/22  Yes [provider]  OZEMPIC, 1 MG/DOSE, 4 MG/3ML SOPN Inject 1 mg into the skin once a week. 01/18/22  Yes [provider]  pantoprazole (PROTONIX) 40 MG tablet Take 40 mg by mouth 2 (two) times daily. 11/17/20  Yes [provider]  potassium chloride (KLOR-CON) 10 MEQ tablet Take 10 mEq by mouth 2 (two) times daily. 05/11/22  Yes [provider]  TRESIBA FLEXTOUCH 100 UNIT/ML SOPN FlexTouch Pen Inject 50 Units into the skin daily.  08/19/18  Yes [provider]  acetaminophen (TYLENOL) 325 MG tablet Take 1-2 tablets (325-650 mg total) by mouth every 6 (six) hours as needed for mild pain (pain score 1-3 or temp > 100.5). Patient not taking: Reported on 05/16/2022 12/27/18   Meredith Pel, MD  aspirin 81 MG chewable tablet Chew 1 tablet (81 mg total) by mouth 2 (two) times daily. Patient not taking: Reported on 05/16/2022 12/27/18   Meredith Pel, MD  HYDROcodone-acetaminophen [facility]) 10-325  MG tablet Take 1 tablet by mouth every 4 (four) hours as needed for moderate pain ((score 4 to 6)). Patient not taking: Reported on 05/16/2022 12/09/20   Earnie Larsson, MD    Physical Exam: Vitals:   05/16/22 1303 05/16/22 1330 05/16/22 1345 05/16/22 1544  BP: 125/88 136/85 (!) 146/90   Pulse: 95 76 85   Resp: '16 15 16   '$ Temp:    98.7 F (37.1 C)  TempSrc:    Oral  SpO2: 99% 99% 99%   Weight:      Height:       Adult female, lying in bed, no acute distress, interactive and appropriate RRR, no murmurs, no peripheral edema Respiratory rate normal, lungs clear without rales or wheezes Abdomen soft with minimal tenderness palpation, no guarding, no rebound, no rigidity, no distention, no masses Attention normal, affect appropriate, judgment and insight appear normal, face symmetric, speech fluent  Data Reviewed: Basic metabolic panel shows mild hyponatremia and hypokalemia, normal renal function LFTs and lipase normal Hemogram shows stable hemoglobin, mild leukocytosis, normal platelets Urinalysis with no white blood cells red blood cells on microscopy CT of the abdomen and pelvis report reviewed, shows thickening of the descending colon    Assessment and Plan: * Hematochezia Patient clinically has a colitis with crampy abdominal pain, frequent painful stools, hematochezia.  Differential probably favors infectious colitis, no real risk factors for ischemia.  No prior history of IBD. Less likely cancer or diverticular bleed.  \  Patient wonders if her recent antibiotics could influence her diarrhea, although I do not see how this would cause hematochezia unless it were just hemorrhoids. - Hold antibiotics - GI pathogen panel and C. difficile - Consult gastroenterology, appreciate recommendations - Hold aspirin  Urethral diverticulum - Hold antibiotics for now  Hypokalemia - Supplement potassium - Hold HCTZ - Check mag  Hyponatremia - Given IV fluids, trend BMP tomorrow -  Hold HCTZ  Barrett's esophagus - Continue PPI  INTERSTITIAL CYSTITIS    Essential hypertension - Continue home amlodipine - Hold HCTZ until hemodynamics clear  Depression, unspecified - Continue  fluoxetine and dulotetine  OBESITY BMI 31.8  Controlled type 2 diabetes mellitus without complication, with long-term current use of insulin (HCC) - Hold Ozempic, Tresiba - Hold aspirin - Sliding scale correction insulin         Advance Care Planning: Full code, presumed  Consults: GI, spoke with Dr. Paulita Fujita  Family Communication: None present  Severity of Illness: The appropriate patient status for this patient is OBSERVATION. Observation status is judged to be reasonable and necessary in order to provide the required intensity of service to ensure the patient's safety. The patient's presenting symptoms, physical exam findings, and initial radiographic and laboratory data in the context of their medical condition is felt to place them at decreased risk for further clinical deterioration. Furthermore, it is anticipated that the patient will be medically stable for discharge from the hospital within 2 midnights of admission.   Author: Edwin Dada, MD 05/16/2022 4:01 PM  For on call review [email or web address].

## 2022-05-16 NOTE — ED Triage Notes (Addendum)
Patient reports that she began having nausea and diarrhea with a small amount of blood x 2 days ago. Patient states yesterday she had bright red diarrhea. Patient states it is "straight blood." No stool.   Patient added at the end of triage that she "passed out" at 0400 today.

## 2022-05-16 NOTE — Hospital Course (Signed)
Mrs. [name] is a 61 y.o. 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  Patient was in her usual state of health until 3 days ago, her antibiotics were switched to cephalexin.  The next day she had abdominal cramps, anorexia, malaise and nausea, and then that night had an episode of severe nausea and vomiting, voluminous soft bowel movement with streaks of blood, then crampy colicky abdominal pain.  Then overnight last night, she had the urge to defecate and the toilet bowl was full of "all blood", no stool.  She had another grossly bloody bowel movement after that, and then went to her PCP who sent her to the ER.  In the ER, heart rate and blood pressure normal.  Hemoglobin 14.9 from baseline 13.5.  Abdomen and pelvis showed possible wall thickening of the descending colon.  She was given fluids and antibiotics and the hospital service were asked to evaluate for hematochezia.

## 2022-05-16 NOTE — Assessment & Plan Note (Signed)
Continue PPI ?

## 2022-05-16 NOTE — ED Provider Notes (Signed)
[facility] DEPT Provider Note   CSN: [id number] Arrival date & time: 05/16/22  1048     History  Chief Complaint  Patient presents with   GI Bleeding   Loss of Consciousness    [name] is a 61 y.o. female.  Patient with history of T2DM, Barrett's esophagus, hypertension, hyperlipidemia presents today with complaints of GI bleeding and abdominal pain. [name] states that same began this morning with 5 bouts of bright red bloody stools. States that [name] is also having left sided abdominal pain that began this morning as well. States that when [name] got up from the toilet after the 5th bloody stool [name] immediately felt lightheaded and diaphoretic and proceeded to have a syncopal episode. [name] denies hitting her head or any injuries associated with this. States [name] had a colonoscopy in 2011 that showed polyps but has not had a subsequent colonoscopy since then. [name] is not anticoagulated. No hx of previous abdominal surgeries.  The history is provided by the patient. No language interpreter was used.  Loss of Consciousness      Home Medications Prior to Admission medications   Medication Sig Start Date End Date Taking? Authorizing Provider  acetaminophen (TYLENOL) 325 MG tablet Take 1-2 tablets (325-650 mg total) by mouth every 6 (six) hours as needed for mild pain (pain score 1-3 or temp > 100.5). 12/27/18   Meredith Pel, MD  amLODipine (NORVASC) 5 MG tablet Take 5 mg by mouth daily.    [provider]  aspirin 81 MG chewable tablet Chew 1 tablet (81 mg total) by mouth 2 (two) times daily. 12/27/18   Meredith Pel, MD  cyclobenzaprine (FLEXERIL) 10 MG tablet Take 1 tablet (10 mg total) by mouth 3 (three) times daily as needed for muscle spasms. 12/09/20   Earnie Larsson, MD  DULoxetine (CYMBALTA) 20 MG capsule Take 20 mg by mouth 2 (two) times daily.    [provider]  ezetimibe (ZETIA) 10 MG tablet Take 10 mg by mouth daily.  10/08/20   [provider]  FLUoxetine (PROZAC) 40 MG capsule Take 1 capsule (40 mg total) by mouth daily. NEEDS APPT FOR REFILLS Patient taking differently: Take 40 mg by mouth daily. 07/10/12   Tanda Rockers, MD  hydrochlorothiazide (HYDRODIURIL) 25 MG tablet Take 1 tablet (25 mg total) by mouth daily. NEEDS APPT FOR REFILLS Patient taking differently: Take 25 mg by mouth daily. 07/10/12   Tanda Rockers, MD  HYDROcodone-acetaminophen (NORCO) 10-325 MG tablet Take 1 tablet by mouth every 4 (four) hours as needed for moderate pain ((score 4 to 6)). 12/09/20   Earnie Larsson, MD  HYDROcodone-acetaminophen (NORCO/VICODIN) 5-325 MG tablet 1 po q 4-6hrs prn pain Patient taking differently: Take 1 tablet by mouth every 4 (four) hours as needed for moderate pain. 01/04/19   Meredith Pel, MD  insulin lispro (HUMALOG) 100 UNIT/ML injection Inject 2 Units into the skin See admin instructions. Inject 2 units SQ up to 6 times daily as needed for BS > 250    [provider]  JARDIANCE 25 MG TABS tablet Take 25 mg by mouth daily. 08/19/18   [provider]  LORazepam (ATIVAN) 1 MG tablet Take 1 mg by mouth at bedtime.    [provider]  pantoprazole (PROTONIX) 40 MG tablet Take 40 mg by mouth daily. 11/17/20   [provider]  Potassium 99 MG TABS Take 99 mg by mouth daily.    [provider]  Semaglutide,0.25 or 0.'5MG'$ /DOS, 2 MG/1.5ML SOPN Inject 0.5 mg into the skin every Friday.     [provider]  TRESIBA FLEXTOUCH 100 UNIT/ML SOPN FlexTouch Pen Inject 50 Units into the skin daily.  08/19/18   [provider]      Allergies    Latex, Penicillins, Erythromycin, Ofloxacin, Sulfonamide derivatives, Azithromycin, Fenofibrate, and Statins    Review of Systems   Review of Systems  Cardiovascular:  Positive for syncope.  Gastrointestinal:  Positive for abdominal pain and anal bleeding. Negative for rectal pain.  All other systems  reviewed and are negative.   Physical Exam Updated Vital Signs BP 125/88 (BP Location: Left Arm)   Pulse 95   Temp 98.8 F (37.1 C) (Oral)   Resp 16   Ht '5\' 6"'$  (1.676 m)   Wt 89.4 kg   SpO2 99%   BMI 31.80 kg/m  Physical Exam Vitals and nursing note reviewed. Exam conducted with a chaperone present.  Constitutional:      General: [name] is not in acute distress.    Appearance: Normal appearance. [name] is normal weight. [name] is not ill-appearing, toxic-appearing or diaphoretic.  HENT:     Head: Normocephalic and atraumatic.  Cardiovascular:     Rate and Rhythm: Normal rate.  Pulmonary:     Effort: Pulmonary effort is normal. No respiratory distress.  Abdominal:     General: Abdomen is flat.     Palpations: Abdomen is soft.     Tenderness: There is abdominal tenderness.  Genitourinary:    Comments: Small amount of bright red blood present in the rectum. No other abnormalities present Musculoskeletal:        General: Normal range of motion.     Cervical back: Normal range of motion.  Skin:    General: Skin is warm and dry.  Neurological:     General: No focal deficit present.     Mental Status: [name] is alert.  Psychiatric:        Mood and Affect: Mood normal.        Behavior: Behavior normal.     ED Results / Procedures / Treatments   Labs (all labs ordered are listed, but only abnormal results are displayed) Labs Reviewed  COMPREHENSIVE METABOLIC PANEL - Abnormal; Notable for the following components:      Result Value   Sodium 134 (*)    Potassium 3.0 (*)    Glucose, Bld 199 (*)    All other components within normal limits  CBC - Abnormal; Notable for the following components:   WBC 11.3 (*)    All other components within normal limits  URINALYSIS, ROUTINE W REFLEX MICROSCOPIC - Abnormal; Notable for the following components:   Color, Urine AMBER (*)    APPearance HAZY (*)    Glucose, UA 150 (*)    Ketones, ur 5 (*)    Protein, ur 100 (*)    Bacteria, UA RARE  (*)    All other components within normal limits  POC OCCULT BLOOD, ED - Abnormal; Notable for the following components:   Fecal Occult Bld POSITIVE (*)    All other components within normal limits  C DIFFICILE QUICK SCREEN W PCR REFLEX    GASTROINTESTINAL PANEL BY PCR, STOOL (REPLACES STOOL CULTURE)  LIPASE, BLOOD  TYPE AND SCREEN    EKG None  Radiology CT ABDOMEN PELVIS W CONTRAST  Result Date: 05/16/2022 CLINICAL DATA:  Abdominal pain, acute, nonlocalized EXAM: CT ABDOMEN AND PELVIS WITH CONTRAST  TECHNIQUE: Multidetector CT imaging of the abdomen and pelvis was performed using the standard protocol following bolus administration of intravenous contrast. RADIATION DOSE REDUCTION: This exam was performed according to the departmental dose-optimization program which includes automated exposure control, adjustment of the mA and/or kV according to patient size and/or use of iterative reconstruction technique. CONTRAST:  136m OMNIPAQUE IOHEXOL 300 MG/ML  SOLN COMPARISON:  02/27/2022 FINDINGS: Lower chest: No acute abnormality. Hepatobiliary: No focal liver abnormality is seen. Status post cholecystectomy. Stable biliary dilatation. Pancreas: Unremarkable. Spleen: Unremarkable. Adrenals/Urinary Tract: Adrenals, kidneys, and poorly distended bladder are unremarkable. 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 Stomach/Bowel: Stomach is within normal limits. Bowel is normal in caliber. Question of wall thickening along the underdistended descending colon. Appendectomy. Vascular/Lymphatic: Atherosclerosis.  No enlarged nodes. Reproductive: Status post hysterectomy. No adnexal masses. Other: No free fluid. Musculoskeletal: Postoperative changes at L2-L5 and right sacroiliac joint. IMPRESSION: Possible descending colon wall thickening with evaluation limited by underdistention. This may reflect colitis. 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 Electronically Signed   By: PMacy MisM.D.    On: 05/16/2022 14:23    Procedures Procedures    Medications Ordered in ED Medications  oxyCODONE (Oxy IR/ROXICODONE) immediate release tablet 5 mg (has no administration in time range)  acetaminophen (TYLENOL) tablet 650 mg (650 mg Oral Given 05/16/22 1315)  sodium chloride 0.9 % bolus 1,000 mL (0 mLs Intravenous Stopped 05/16/22 1457)  morphine (PF) 4 MG/ML injection 4 mg (4 mg Intravenous Given 05/16/22 1341)  pantoprazole (PROTONIX) injection 40 mg (40 mg Intravenous Given 05/16/22 1339)  iohexol (OMNIPAQUE) 300 MG/ML solution 100 mL (100 mLs Intravenous Contrast Given 05/16/22 1405)  potassium chloride SA (KLOR-CON M) CR tablet 40 mEq (40 mEq Oral Given 05/16/22 1456)    ED Course/ Medical Decision Making/ A&P                           Medical Decision Making Amount and/or Complexity of Data Reviewed Labs: ordered. Radiology: ordered.  Risk OTC drugs. Prescription drug management. Decision regarding hospitalization.   This patient presents to the ED for concern of abdominal pain and rectal bleeding, this involves an extensive number of treatment options, and is a complaint that carries with it a high risk of complications and morbidity.   Co morbidities that complicate the patient evaluation  Hx Barrett's esophagus, T2DM, hypertension   Additional history obtained:  Additional history obtained from patient's colonoscopy report in 2011   Lab Tests:  I Ordered, and personally interpreted labs.  The pertinent results include:  K 3.0, Na 134, glucose 199. WBC 11.3. Hemoccult positive. UA with ketonuria and proteinuria   Imaging Studies ordered:  I ordered imaging studies including CT abdomen pelvis with contrast  I independently visualized and interpreted imaging which showed  Possible descending colon wall thickening with evaluation limited by underdistention. This may reflect colitis.  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 I agree with the radiologist  interpretation   Consultations Obtained:  I requested consultation with the gastroenterology,  and discussed lab and imaging findings as well as pertinent plan - they recommend: Hospitalist Dr. DLoleta Bookswho admitted patient states he will contact GI and determine plan.   Problem List / ED Course / Critical interventions / Medication management  I ordered medication including morphine, protonix, potassium, and fluids  for pain, GI bleed, hypokalemia, and dehydration  Reevaluation of the patient after these medicines showed that the patient improved I have reviewed the  patients home medicines and have made adjustments as needed   Test / Admission - Considered:  Patient presents today with complaints of GI bleeding that started this morning. Same has persisted since with most recent episode of bright red bleeding about 2 hours ago. [name] also continues to have significant abdominal pain. Hemoccult positive, hemoglobin stable. However, patient did have a syncopal episode this morning and states [name] feels lightheaded when [name] stands up. Given continued bleeding with symptoms, will need admission for same. Started on cefepime and flagyl for colitis. Patient is understanding and amenable with plan.  Findings and plan of care discussed with supervising physician Dr. Tomi Bamberger who is in agreement.    Discussed patient with hospitalist who agrees to admit.   Final Clinical Impression(s) / ED Diagnoses Final diagnoses:  Acute GI bleeding  Generalized abdominal pain  Vasovagal syncope    Rx / DC Orders ED Discharge Orders     None         Nestor Lewandowsky 05/17/22 1925    Dorie Rank, MD 05/21/22 [phone]

## 2022-05-16 NOTE — ED Provider Notes (Signed)
Pt's care assumed at 3:30pm.  Pt admitted to Hospitalist.  Pt awaiting gi consult.   Dr. Loleta Books evaluated pt for admission.  He states he will contact Gi.   Sidney Ace 05/16/22 1558    Carmin Muskrat, MD 05/16/22 [phone]

## 2022-05-16 NOTE — Assessment & Plan Note (Signed)
-   Hold Ozempic, Tresiba - Hold aspirin - Sliding scale correction insulin

## 2022-05-16 NOTE — Assessment & Plan Note (Signed)
BMI 31.8

## 2022-05-16 NOTE — Assessment & Plan Note (Signed)
-   Hold antibiotics for now

## 2022-05-17 DIAGNOSIS — E119 Type 2 diabetes mellitus without complications: Secondary | ICD-10-CM | POA: Diagnosis not present

## 2022-05-17 DIAGNOSIS — F32A Depression, unspecified: Secondary | ICD-10-CM

## 2022-05-17 DIAGNOSIS — N3010 Interstitial cystitis (chronic) without hematuria: Secondary | ICD-10-CM | POA: Diagnosis not present

## 2022-05-17 DIAGNOSIS — K2270 Barrett's esophagus without dysplasia: Secondary | ICD-10-CM | POA: Diagnosis not present

## 2022-05-17 DIAGNOSIS — K921 Melena: Secondary | ICD-10-CM | POA: Diagnosis not present

## 2022-05-17 DIAGNOSIS — E876 Hypokalemia: Secondary | ICD-10-CM

## 2022-05-17 LAB — CBC
HCT: 39.1 % (ref 36.0–46.0)
Hemoglobin: 13.1 g/dL (ref 12.0–15.0)
MCH: 30.8 pg (ref 26.0–34.0)
MCHC: 33.5 g/dL (ref 30.0–36.0)
MCV: 92 fL (ref 80.0–100.0)
Platelets: 311 10*3/uL (ref 150–400)
RBC: 4.25 MIL/uL (ref 3.87–5.11)
RDW: 12.8 % (ref 11.5–15.5)
WBC: 8.5 10*3/uL (ref 4.0–10.5)
nRBC: 0 % (ref 0.0–0.2)

## 2022-05-17 LAB — BASIC METABOLIC PANEL
Anion gap: 9 (ref 5–15)
BUN: 10 mg/dL (ref 6–20)
CO2: 26 mmol/L (ref 22–32)
Calcium: 8.9 mg/dL (ref 8.9–10.3)
Chloride: 104 mmol/L (ref 98–111)
Creatinine, Ser: 0.65 mg/dL (ref 0.44–1.00)
GFR, Estimated: >60 mL/min (ref >60)
Glucose, Bld: 173 mg/dL — ABNORMAL HIGH (ref 70–99)
Potassium: 3.6 mmol/L (ref 3.5–5.1)
Sodium: 139 mmol/L (ref 135–145)

## 2022-05-17 LAB — C DIFFICILE QUICK SCREEN W PCR REFLEX
C Diff antigen: NEGATIVE
C Diff interpretation: NOT DETECTED
C Diff toxin: NEGATIVE

## 2022-05-17 LAB — MAGNESIUM: Magnesium: 2 mg/dL (ref 1.7–2.4)

## 2022-05-17 LAB — GLUCOSE, CAPILLARY
Glucose-Capillary: 153 mg/dL — ABNORMAL HIGH (ref 70–99)
Glucose-Capillary: 217 mg/dL — ABNORMAL HIGH (ref 70–99)
Glucose-Capillary: 177 mg/dL — ABNORMAL HIGH (ref 70–99)

## 2022-05-17 LAB — HIV ANTIBODY (ROUTINE TESTING W REFLEX): HIV Screen 4th Generation wRfx: NONREACTIVE

## 2022-05-17 NOTE — H&P (View-Only) (Signed)
[facility] Consultation Note  Referring Provider: [facility] Primary Care Physician:  Ardith Dark, PA-C Primary Gastroenterologist:  [facility]  Reason for Consultation:  hematochezia  HPI: [name] is a 61 y.o. female admitted nausea, vomiting, diarrhea, hematochezia. Symptoms started within couple days of taking antibiotics (and several prior rounds of other antibiotics) for urethral diverticulitis.  CT scan showed possible descending colitis without colonic diverticulitis.  Has personal history colon polyps, tells me she is due for another colonoscopy now.  No blood in stool or diarrhea since her admission last night.  Past Medical History:  Diagnosis Date   Anxiety    Barrett esophagus    Dr. Olevia Perches in GI   Cancer [facility])    Skin Cancer   Chronic back pain    stenosis and DDD   DDD (degenerative disc disease)    s/p back surgeries and steroid injxns   Depression    takes Fluoxetine daily   Diabetes mellitus    Victoza daily   GERD (gastroesophageal reflux disease)    takes Nexium daily   History of kidney stones    Hyperlipidemia    hasn't been on Pravastatin in 6-7wks    Hypertension    takes Amlodipine and HCTZ daily   Interstitial cystitis    Joint pain    Joint swelling    Muscle spasm    takes Ativan nightly as needed   Numbness    weakness in right leg   Pneumonia    fall 2014   PONV (postoperative nausea and vomiting)    Spinal headache    blood patch required    Urinary frequency     Past Surgical History:  Procedure Laterality Date   adhesions removed from abdomen     APPENDECTOMY     BACK SURGERY  12/2007   x 6-fusion x 2   CARDIAC CATHETERIZATION  2013   CESAREAN SECTION  1988/1989   CHOLECYSTECTOMY     COLONOSCOPY  2011   epidural injections     KNEE ARTHROSCOPY Bilateral    LAPAROSCOPIC LYSIS OF ADHESIONS N/A 04/15/2014   Procedure: LAPAROSCOPIC LYSIS OF ADHESION;  Surgeon: Marcello Moores A. Cornett, MD;  Location:  [place];  Service: General;  Laterality: N/A;   LAPAROSCOPY N/A 04/15/2014   Procedure: LAPAROSCOPY DIAGNOSTIC;  Surgeon: Joyice Faster. Cornett, MD;  Location: [place];  Service: General;  Laterality: N/A;   LEFT HEART CATHETERIZATION WITH CORONARY ANGIOGRAM N/A 02/03/2012   Procedure: LEFT HEART CATHETERIZATION WITH CORONARY ANGIOGRAM;  Surgeon: Troy Sine, MD;  Location[facility];  Service: Cardiovascular;  Laterality: N/A;   LUMBAR FUSION Right 03/24/2015   Procedure: SI joint fusion - right ;  Surgeon: Karie Chimera, MD;  Location: [place] [facility];  Service: Neurosurgery;  Laterality: Right;  SI joint fusion - right    NECK SURGERY  12/2007   OOPHORECTOMY     TOTAL ABDOMINAL HYSTERECTOMY     TOTAL KNEE ARTHROPLASTY Right 12/25/2018   TOTAL KNEE ARTHROPLASTY Right 12/25/2018   Procedure: RIGHT TOTAL KNEE ARTHROPLASTY;  Surgeon: Meredith Pel, MD;  Location: [place];  Service: Orthopedics;  Laterality: Right;   upper gi endoscopy      Prior to Admission medications   Medication Sig Start Date End Date Taking? Authorizing Provider  amLODipine (NORVASC) 5 MG tablet Take 5 mg by mouth daily.   Yes [provider]  cephALEXin (KEFLEX) 500 MG capsule Take 500 mg by mouth 3 (three) times daily. 05/11/22  Yes [provider]  cyclobenzaprine (FLEXERIL) 10 MG tablet Take 1 tablet (10 mg total) by mouth 3 (three) times daily as needed for muscle spasms. 12/09/20  Yes Pool, Mallie Mussel, MD  DULoxetine (CYMBALTA) 20 MG capsule Take 20 mg by mouth daily.   Yes [provider]  ezetimibe (ZETIA) 10 MG tablet Take 10 mg by mouth daily. 10/08/20  Yes [provider]  FLUoxetine (PROZAC) 40 MG capsule Take 1 capsule (40 mg total) by mouth daily. NEEDS APPT FOR REFILLS Patient taking differently: Take 40 mg by mouth daily. 07/10/12  Yes Tanda Rockers, MD  hydrochlorothiazide (HYDRODIURIL) 25 MG tablet Take 1 tablet (25 mg total) by mouth daily. NEEDS APPT FOR REFILLS Patient taking  differently: Take 25 mg by mouth daily. 07/10/12  Yes Tanda Rockers, MD  HYDROcodone-acetaminophen (NORCO/VICODIN) 5-325 MG tablet 1 po q 4-6hrs prn pain Patient taking differently: Take 1 tablet by mouth every 4 (four) hours as needed for moderate pain. 01/04/19  Yes Meredith Pel, MD  insulin lispro (HUMALOG) 100 UNIT/ML injection Inject 2 Units into the skin See admin instructions. Inject 2 units SQ up to 6 times daily as needed for BS > 250   Yes [provider]  nitrofurantoin, macrocrystal-monohydrate, (MACROBID) 100 MG capsule Take 100 mg by mouth daily. 05/05/22  Yes [provider]  OZEMPIC, 1 MG/DOSE, 4 MG/3ML SOPN Inject 1 mg into the skin once a week. 01/18/22  Yes [provider]  pantoprazole (PROTONIX) 40 MG tablet Take 40 mg by mouth 2 (two) times daily. 11/17/20  Yes [provider]  potassium chloride (KLOR-CON) 10 MEQ tablet Take 10 mEq by mouth 2 (two) times daily. 05/11/22  Yes [provider]  TRESIBA FLEXTOUCH 100 UNIT/ML SOPN FlexTouch Pen Inject 50 Units into the skin daily.  08/19/18  Yes [provider]  acetaminophen (TYLENOL) 325 MG tablet Take 1-2 tablets (325-650 mg total) by mouth every 6 (six) hours as needed for mild pain (pain score 1-3 or temp > 100.5). Patient not taking: Reported on 05/16/2022 12/27/18   Meredith Pel, MD  aspirin 81 MG chewable tablet Chew 1 tablet (81 mg total) by mouth 2 (two) times daily. Patient not taking: Reported on 05/16/2022 12/27/18   Meredith Pel, MD  HYDROcodone-acetaminophen [facility]) 10-325 MG tablet Take 1 tablet by mouth every 4 (four) hours as needed for moderate pain ((score 4 to 6)). Patient not taking: Reported on 05/16/2022 12/09/20   Earnie Larsson, MD    Current Facility-Administered Medications  Medication Dose Route Frequency Provider Last Rate Last Admin   amLODipine (NORVASC) tablet 5 mg  5 mg Oral Daily Danford, Suann Larry, MD   5 mg at 05/17/22 7673    DULoxetine (CYMBALTA) DR capsule 20 mg  20 mg Oral Daily Edwin Dada, MD   20 mg at 05/17/22 4193   ezetimibe (ZETIA) tablet 10 mg  10 mg Oral Daily Edwin Dada, MD   10 mg at 05/17/22 0952   FLUoxetine (PROZAC) capsule 40 mg  40 mg Oral Daily Edwin Dada, MD   40 mg at 05/17/22 0952   HYDROcodone-acetaminophen (NORCO/VICODIN) 5-325 MG per tablet 1 tablet  1 tablet Oral Q4H PRN Edwin Dada, MD   1 tablet at 05/17/22 0720   insulin aspart (novoLOG) injection 0-15 Units  0-15 Units Subcutaneous TID WC Edwin Dada, MD   3 Units at 05/17/22 0803   insulin aspart (novoLOG) injection 0-5 Units  0-5 Units Subcutaneous QHS Danford, Suann Larry, MD       ondansetron (ZOFRAN) tablet 4 mg  4 mg Oral Q6H PRN Danford, Suann Larry, MD       Or   ondansetron (ZOFRAN) injection 4 mg  4 mg Intravenous Q6H PRN Danford, Suann Larry, MD       pantoprazole (PROTONIX) EC tablet 40 mg  40 mg Oral BID Edwin Dada, MD   40 mg at 05/17/22 1497    Allergies as of 05/16/2022 - Review Complete 05/16/2022  Allergen Reaction Noted   Latex Anaphylaxis    Penicillins Anaphylaxis and Other (See Comments)    Erythromycin Hives and Itching    Ofloxacin Hives and Itching    Sulfonamide derivatives Hives and Itching    Azithromycin Other (See Comments) 03/16/2015   Fenofibrate Other (See Comments) 08/14/2018   Statins Nausea And Vomiting 12/14/2018    Family History  Problem Relation Age of Onset   Heart disease Mother        ischemic   Gallbladder disease Mother    Other Mother        rectovaginal fistula   Hypertension Father    Colon polyps Father    Allergies Sister    Asthma Sister     Social History   Socioeconomic History   Marital status: Married    Spouse name: Not on file   Number of children: 2   Years of education: Not on file   Highest education level: Not on file  Occupational History   Occupation: on disability since March  2009 d/t back pain   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 She is still active and ambulate on her own. She is currently smoking up to a pack per day but interested in stopping.    Social Determinants of Health   Financial Resource Strain: Not on file  Food Insecurity: Not on file  Transportation Needs: Not on file  Physical Activity: Not on file  Stress: Not on file  Social Connections: Not on file  Intimate Partner Violence: Not on file    Review of Systems: As per HPI, all others negative  Physical Exam: Vital signs in last 24 hours: Temp:  [97.7 F (36.5 C)-98.8 F (37.1 C)] 97.8 F (36.6 C) (07/18 0637) Pulse Rate:  [63-95] 64 (07/18 0637) Resp:  [15-18] 18 (07/18 0637) BP: (117-164)/(73-91) 126/77 (07/18 0637) SpO2:  [97 %-100 %] 98 % (07/18 0637) Weight:  [89.4 kg] 89.4 kg (07/17 1126) Last BM Date : 05/16/22 General:   Alert,  Well-developed, well-nourished, pleasant and cooperative in NAD Head:  Normocephalic and atraumatic. Eyes:  Sclera clear, no icterus.   Conjunctiva pink. Ears:  Normal auditory acuity. Nose:  No deformity, discharge,  or lesions. Mouth:  No deformity or lesions.  Oropharynx pink & moist. Neck:  Supple; no masses  or thyromegaly. Lungs:  Clear throughout to auscultation.   No wheezes, crackles, or rhonchi. No acute distress. Heart:  Regular rate and rhythm; no murmurs, clicks, rubs,  or gallops. Abdomen:  Soft, protuberant, mild generalized tenderness without  peritonitis, and nondistended. No masses, hepatosplenomegaly or hernias noted. Normal bowel sounds, without guarding, and without rebound.     Msk:  Symmetrical without gross deformities. Normal posture. Pulses:  Normal pulses noted. Extremities:  Without clubbing or edema. Neurologic:  Alert and  oriented x4;  grossly normal neurologically. Skin:  Intact without significant lesions or rashes. Psych:  Alert and cooperative. Normal mood and affect.   Lab Results: Recent Labs    05/16/22 1147 05/17/22 0342  WBC 11.3* 8.5  HGB 14.9 13.1  HCT 42.7 39.1  PLT 391 311   BMET Recent Labs    05/16/22 1147 05/17/22 0342  NA 134* 139  K 3.0* 3.6  CL 98 104  CO2 24 26  GLUCOSE 199* 173*  BUN 14 10  CREATININE 0.82 0.65  CALCIUM 9.5 8.9   LFT Recent Labs    05/16/22 1147  PROT 7.7  ALBUMIN 4.2  AST 29  ALT 32  ALKPHOS 74  BILITOT 0.9   PT/INR No results for input(s): "LABPROT", "INR" in the last 72 hours.  Studies/Results: CT ABDOMEN PELVIS W CONTRAST  Result Date: 05/16/2022 CLINICAL DATA:  Abdominal pain, acute, nonlocalized EXAM: CT ABDOMEN AND PELVIS WITH CONTRAST TECHNIQUE: Multidetector CT imaging of the abdomen and pelvis was performed using the standard protocol following bolus administration of intravenous contrast. RADIATION DOSE REDUCTION: This exam was performed according to the departmental dose-optimization program which includes automated exposure control, adjustment of the mA and/or kV according to patient size and/or use of iterative reconstruction technique. CONTRAST:  141m OMNIPAQUE IOHEXOL 300 MG/ML  SOLN COMPARISON:  02/27/2022 FINDINGS: Lower chest: No acute abnormality. Hepatobiliary: No focal liver abnormality is seen. Status post cholecystectomy. Stable biliary dilatation. Pancreas: Unremarkable. Spleen: Unremarkable. Adrenals/Urinary Tract: Adrenals, kidneys, and poorly distended bladder are unremarkable. Possible right urethral diverticulum is again  noted. Stomach/Bowel: Stomach is within normal limits. Bowel is normal in caliber. Question of wall thickening along the underdistended descending colon. Appendectomy. Vascular/Lymphatic: Atherosclerosis.  No enlarged nodes. Reproductive: Status post hysterectomy. No adnexal masses. Other: No free fluid. Musculoskeletal: Postoperative changes at L2-L5 and right sacroiliac joint. IMPRESSION: Possible descending colon wall thickening with evaluation limited by underdistention. This may reflect colitis. 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 Electronically Signed   By: PMacy MisM.D.   On: 05/16/2022 14:23    Impression:   Hematochezia and diarrhea, resolved.    Nausea with vomiting. Overall suspect either infectious or inflammatory colitis. Personal history of colon polyps, last colonoscopy about 2 years ago [facility] patient states she is due for another. Urethral diverticulum, with multiple rounds of antibiotics for diverticulitis.  Plan:   Advance diet. Stool studies (if possible, she hasn't had stool since soon after admission yesterday afternoon). If patient does well without recurrent diarrhea or hematochezia, would consider discharge home tomorrow [facility] will follow.   LOS: 0 days   Merit Maybee M  05/17/2022, 11:04 AM  Cell [phone] no answer or after 5 PM call [phone]

## 2022-05-17 NOTE — Consult Note (Signed)
[facility] Consultation Note  Referring Provider: [facility] Primary Care Physician:  Ardith Dark, PA-C Primary Gastroenterologist:  [facility]  Reason for Consultation:  hematochezia  HPI: [name] is a 61 y.o. female admitted nausea, vomiting, diarrhea, hematochezia. Symptoms started within couple days of taking antibiotics (and several prior rounds of other antibiotics) for urethral diverticulitis.  CT scan showed possible descending colitis without colonic diverticulitis.  Has personal history colon polyps, tells me she is due for another colonoscopy now.  No blood in stool or diarrhea since her admission last night.  Past Medical History:  Diagnosis Date   Anxiety    Barrett esophagus    Dr. Olevia Perches in GI   Cancer [facility])    Skin Cancer   Chronic back pain    stenosis and DDD   DDD (degenerative disc disease)    s/p back surgeries and steroid injxns   Depression    takes Fluoxetine daily   Diabetes mellitus    Victoza daily   GERD (gastroesophageal reflux disease)    takes Nexium daily   History of kidney stones    Hyperlipidemia    hasn't been on Pravastatin in 6-7wks    Hypertension    takes Amlodipine and HCTZ daily   Interstitial cystitis    Joint pain    Joint swelling    Muscle spasm    takes Ativan nightly as needed   Numbness    weakness in right leg   Pneumonia    fall 2014   PONV (postoperative nausea and vomiting)    Spinal headache    blood patch required    Urinary frequency     Past Surgical History:  Procedure Laterality Date   adhesions removed from abdomen     APPENDECTOMY     BACK SURGERY  12/2007   x 6-fusion x 2   CARDIAC CATHETERIZATION  2013   CESAREAN SECTION  1988/1989   CHOLECYSTECTOMY     COLONOSCOPY  2011   epidural injections     KNEE ARTHROSCOPY Bilateral    LAPAROSCOPIC LYSIS OF ADHESIONS N/A 04/15/2014   Procedure: LAPAROSCOPIC LYSIS OF ADHESION;  Surgeon: Marcello Moores A. Cornett, MD;  Location:  [place];  Service: General;  Laterality: N/A;   LAPAROSCOPY N/A 04/15/2014   Procedure: LAPAROSCOPY DIAGNOSTIC;  Surgeon: Joyice Faster. Cornett, MD;  Location: [place];  Service: General;  Laterality: N/A;   LEFT HEART CATHETERIZATION WITH CORONARY ANGIOGRAM N/A 02/03/2012   Procedure: LEFT HEART CATHETERIZATION WITH CORONARY ANGIOGRAM;  Surgeon: Troy Sine, MD;  Location: [facility] CATH LAB;  Service: Cardiovascular;  Laterality: N/A;   LUMBAR FUSION Right 03/24/2015   Procedure: SI joint fusion - right ;  Surgeon: Karie Chimera, MD;  Location: [place] NEURO ORS;  Service: Neurosurgery;  Laterality: Right;  SI joint fusion - right    NECK SURGERY  12/2007   OOPHORECTOMY     TOTAL ABDOMINAL HYSTERECTOMY     TOTAL KNEE ARTHROPLASTY Right 12/25/2018   TOTAL KNEE ARTHROPLASTY Right 12/25/2018   Procedure: RIGHT TOTAL KNEE ARTHROPLASTY;  Surgeon: Meredith Pel, MD;  Location: [place];  Service: Orthopedics;  Laterality: Right;   upper gi endoscopy      Prior to Admission medications   Medication Sig Start Date End Date Taking? Authorizing Provider  amLODipine (NORVASC) 5 MG tablet Take 5 mg by mouth daily.   Yes [provider]  cephALEXin (KEFLEX) 500 MG capsule Take 500 mg by mouth 3 (three) times daily. 05/11/22  Yes [provider]  cyclobenzaprine (FLEXERIL) 10 MG tablet Take 1 tablet (10 mg total) by mouth 3 (three) times daily as needed for muscle spasms. 12/09/20  Yes Pool, Mallie Mussel, MD  DULoxetine (CYMBALTA) 20 MG capsule Take 20 mg by mouth daily.   Yes [provider]  ezetimibe (ZETIA) 10 MG tablet Take 10 mg by mouth daily. 10/08/20  Yes [provider]  FLUoxetine (PROZAC) 40 MG capsule Take 1 capsule (40 mg total) by mouth daily. NEEDS APPT FOR REFILLS Patient taking differently: Take 40 mg by mouth daily. 07/10/12  Yes Tanda Rockers, MD  hydrochlorothiazide (HYDRODIURIL) 25 MG tablet Take 1 tablet (25 mg total) by mouth daily. NEEDS APPT FOR REFILLS Patient taking  differently: Take 25 mg by mouth daily. 07/10/12  Yes Tanda Rockers, MD  HYDROcodone-acetaminophen (NORCO/VICODIN) 5-325 MG tablet 1 po q 4-6hrs prn pain Patient taking differently: Take 1 tablet by mouth every 4 (four) hours as needed for moderate pain. 01/04/19  Yes Meredith Pel, MD  insulin lispro (HUMALOG) 100 UNIT/ML injection Inject 2 Units into the skin See admin instructions. Inject 2 units SQ up to 6 times daily as needed for BS > 250   Yes [provider]  nitrofurantoin, macrocrystal-monohydrate, (MACROBID) 100 MG capsule Take 100 mg by mouth daily. 05/05/22  Yes [provider]  OZEMPIC, 1 MG/DOSE, 4 MG/3ML SOPN Inject 1 mg into the skin once a week. 01/18/22  Yes [provider]  pantoprazole (PROTONIX) 40 MG tablet Take 40 mg by mouth 2 (two) times daily. 11/17/20  Yes [provider]  potassium chloride (KLOR-CON) 10 MEQ tablet Take 10 mEq by mouth 2 (two) times daily. 05/11/22  Yes [provider]  TRESIBA FLEXTOUCH 100 UNIT/ML SOPN FlexTouch Pen Inject 50 Units into the skin daily.  08/19/18  Yes [provider]  acetaminophen (TYLENOL) 325 MG tablet Take 1-2 tablets (325-650 mg total) by mouth every 6 (six) hours as needed for mild pain (pain score 1-3 or temp > 100.5). Patient not taking: Reported on 05/16/2022 12/27/18   Meredith Pel, MD  aspirin 81 MG chewable tablet Chew 1 tablet (81 mg total) by mouth 2 (two) times daily. Patient not taking: Reported on 05/16/2022 12/27/18   Meredith Pel, MD  HYDROcodone-acetaminophen [facility]) 10-325 MG tablet Take 1 tablet by mouth every 4 (four) hours as needed for moderate pain ((score 4 to 6)). Patient not taking: Reported on 05/16/2022 12/09/20   Earnie Larsson, MD    Current Facility-Administered Medications  Medication Dose Route Frequency Provider Last Rate Last Admin   amLODipine (NORVASC) tablet 5 mg  5 mg Oral Daily Danford, Suann Larry, MD   5 mg at 05/17/22 8938    DULoxetine (CYMBALTA) DR capsule 20 mg  20 mg Oral Daily Edwin Dada, MD   20 mg at 05/17/22 1017   ezetimibe (ZETIA) tablet 10 mg  10 mg Oral Daily Edwin Dada, MD   10 mg at 05/17/22 0952   FLUoxetine (PROZAC) capsule 40 mg  40 mg Oral Daily Edwin Dada, MD   40 mg at 05/17/22 0952   HYDROcodone-acetaminophen (NORCO/VICODIN) 5-325 MG per tablet 1 tablet  1 tablet Oral Q4H PRN Edwin Dada, MD   1 tablet at 05/17/22 0720   insulin aspart (novoLOG) injection 0-15 Units  0-15 Units Subcutaneous TID WC Edwin Dada, MD   3 Units at 05/17/22 0803   insulin aspart (novoLOG) injection 0-5 Units  0-5 Units Subcutaneous QHS Danford, Suann Larry, MD       ondansetron (ZOFRAN) tablet 4 mg  4 mg Oral Q6H PRN Danford, Suann Larry, MD       Or   ondansetron (ZOFRAN) injection 4 mg  4 mg Intravenous Q6H PRN Danford, Suann Larry, MD       pantoprazole (PROTONIX) EC tablet 40 mg  40 mg Oral BID Edwin Dada, MD   40 mg at 05/17/22 4742    Allergies as of 05/16/2022 - Review Complete 05/16/2022  Allergen Reaction Noted   Latex Anaphylaxis    Penicillins Anaphylaxis and Other (See Comments)    Erythromycin Hives and Itching    Ofloxacin Hives and Itching    Sulfonamide derivatives Hives and Itching    Azithromycin Other (See Comments) 03/16/2015   Fenofibrate Other (See Comments) 08/14/2018   Statins Nausea And Vomiting 12/14/2018    Family History  Problem Relation Age of Onset   Heart disease Mother        ischemic   Gallbladder disease Mother    Other Mother        rectovaginal fistula   Hypertension Father    Colon polyps Father    Allergies Sister    Asthma Sister     Social History   Socioeconomic History   Marital status: Married    Spouse name: Not on file   Number of children: 2   Years of education: Not on file   Highest education level: Not on file  Occupational History   Occupation: on disability since March  2009 d/t back pain   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 She is still active and ambulate on her own. She is currently smoking up to a pack per day but interested in stopping.    Social Determinants of Health   Financial Resource Strain: Not on file  Food Insecurity: Not on file  Transportation Needs: Not on file  Physical Activity: Not on file  Stress: Not on file  Social Connections: Not on file  Intimate Partner Violence: Not on file    Review of Systems: As per HPI, all others negative  Physical Exam: Vital signs in last 24 hours: Temp:  [97.7 F (36.5 C)-98.8 F (37.1 C)] 97.8 F (36.6 C) (07/18 0637) Pulse Rate:  [63-95] 64 (07/18 0637) Resp:  [15-18] 18 (07/18 0637) BP: (117-164)/(73-91) 126/77 (07/18 0637) SpO2:  [97 %-100 %] 98 % (07/18 0637) Weight:  [89.4 kg] 89.4 kg (07/17 1126) Last BM Date : 05/16/22 General:   Alert,  Well-developed, well-nourished, pleasant and cooperative in NAD Head:  Normocephalic and atraumatic. Eyes:  Sclera clear, no icterus.   Conjunctiva pink. Ears:  Normal auditory acuity. Nose:  No deformity, discharge,  or lesions. Mouth:  No deformity or lesions.  Oropharynx pink & moist. Neck:  Supple; no masses  or thyromegaly. Lungs:  Clear throughout to auscultation.   No wheezes, crackles, or rhonchi. No acute distress. Heart:  Regular rate and rhythm; no murmurs, clicks, rubs,  or gallops. Abdomen:  Soft, protuberant, mild generalized tenderness without  peritonitis, and nondistended. No masses, hepatosplenomegaly or hernias noted. Normal bowel sounds, without guarding, and without rebound.     Msk:  Symmetrical without gross deformities. Normal posture. Pulses:  Normal pulses noted. Extremities:  Without clubbing or edema. Neurologic:  Alert and  oriented x4;  grossly normal neurologically. Skin:  Intact without significant lesions or rashes. Psych:  Alert and cooperative. Normal mood and affect.   Lab Results: Recent Labs    05/16/22 1147 05/17/22 0342  WBC 11.3* 8.5  HGB 14.9 13.1  HCT 42.7 39.1  PLT 391 311   BMET Recent Labs    05/16/22 1147 05/17/22 0342  NA 134* 139  K 3.0* 3.6  CL 98 104  CO2 24 26  GLUCOSE 199* 173*  BUN 14 10  CREATININE 0.82 0.65  CALCIUM 9.5 8.9   LFT Recent Labs    05/16/22 1147  PROT 7.7  ALBUMIN 4.2  AST 29  ALT 32  ALKPHOS 74  BILITOT 0.9   PT/INR No results for input(s): "LABPROT", "INR" in the last 72 hours.  Studies/Results: CT ABDOMEN PELVIS W CONTRAST  Result Date: 05/16/2022 CLINICAL DATA:  Abdominal pain, acute, nonlocalized EXAM: CT ABDOMEN AND PELVIS WITH CONTRAST TECHNIQUE: Multidetector CT imaging of the abdomen and pelvis was performed using the standard protocol following bolus administration of intravenous contrast. RADIATION DOSE REDUCTION: This exam was performed according to the departmental dose-optimization program which includes automated exposure control, adjustment of the mA and/or kV according to patient size and/or use of iterative reconstruction technique. CONTRAST:  132m OMNIPAQUE IOHEXOL 300 MG/ML  SOLN COMPARISON:  02/27/2022 FINDINGS: Lower chest: No acute abnormality. Hepatobiliary: No focal liver abnormality is seen. Status post cholecystectomy. Stable biliary dilatation. Pancreas: Unremarkable. Spleen: Unremarkable. Adrenals/Urinary Tract: Adrenals, kidneys, and poorly distended bladder are unremarkable. Possible right urethral diverticulum is again  noted. Stomach/Bowel: Stomach is within normal limits. Bowel is normal in caliber. Question of wall thickening along the underdistended descending colon. Appendectomy. Vascular/Lymphatic: Atherosclerosis.  No enlarged nodes. Reproductive: Status post hysterectomy. No adnexal masses. Other: No free fluid. Musculoskeletal: Postoperative changes at L2-L5 and right sacroiliac joint. IMPRESSION: Possible descending colon wall thickening with evaluation limited by underdistention. This may reflect colitis. 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 Electronically Signed   By: PMacy MisM.D.   On: 05/16/2022 14:23    Impression:   Hematochezia and diarrhea, resolved.    Nausea with vomiting. Overall suspect either infectious or inflammatory colitis. Personal history of colon polyps, [facility]. Urethral diverticulum, with multiple rounds of antibiotics for diverticulitis.  Plan:   Advance diet. Stool studies (if possible, she hasn't had stool since soon after admission yesterday afternoon). If patient does well without recurrent diarrhea or hematochezia, would consider discharge home tomorrow [facility] will follow.   LOS: 0 days   Lamona Eimer M  05/17/2022, 11:04 AM  Cell [phone] no answer or after 5 PM call [phone]

## 2022-05-17 NOTE — Progress Notes (Signed)
  Progress Note   Patient: [name] [id number] DOB: Oct 02, 1961 DOA: 05/16/2022     0 DOS: the patient was seen and examined on 05/17/2022        Brief hospital course: Mrs. Vary is a 61 y.o. 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  In the ER, she had gross hematochezia, was given antibiotics and fluids and the hospitalists were consulted.        Assessment and Plan: * Hematochezia Patient still with some pain through the afternoon, BM this afternoon, still bloody.   - Send for GI pathogen panel - Hold antibiotics until hemorrhagic E coli ruled out - Hold aspirin - Trend Hgb - Consult GI, appreciate cares - I have contacted her primary GI at [facility] and they will arrange for prompt follow up after discharge if she does not require endoscopy here  Urethral diverticulum INTERSTITIAL CYSTITIS Patient concerned that her symptoms started after starting cephalexin.  Was previously on nitrofurantoin.  This care is guided by her Urologist. - Hold antibiotics for now  Hypokalemia Resolved, mag normal  Hyponatremia Na normalized  Barrett's esophagus - Continue PPI  Essential hypertension BP normal - Continue home amlodipine -R esume HCTZ at discharge  Depression, unspecified - Continue fluoxetine and duloxetine  OBESITY BMI 31.8  Controlled type 2 diabetes mellitus without complication, with long-term current use of insulin (HCC) Glucose controlled - Hold Ozempic, Tresiba - Hold aspirin - Continue Sliding scale correction insulin          Subjective: Still crampy pain all day, still weak, had bloody BM this afternoon     Physical Exam: Vitals:   05/16/22 1721 05/16/22 2118 05/17/22 0637 05/17/22 1300  BP: 131/73 117/74 126/77 120/83  Pulse: 63 63 64 73  Resp: '18 16 18 18  '$ Temp: 98 F (36.7 C) 97.7 F  (36.5 C) 97.8 F (36.6 C) 98.3 F (36.8 C)  TempSrc: Oral Oral Oral Oral  SpO2: 100% 97% 98% 100%  Weight:      Height:       Adult female, lying in bed, no acute distress RRR, no murmurs, no peripheral edema Respiratory rate normal, lungs clear without rales or wheezes Abdomen soft with diffuse tenderness, voluntary guarding noted, no ascites or distention, no masses Attention normal, affect normal, judgment insight appear normal  Data Reviewed: Hemogram shows stable hemoglobin 13, slight drop from yesterday from 14 Basic metabolic panel shows normal sodium, potassium, creatinine normal   Family Communication: Husband at the bedside    Disposition: Status is: Observation The patient was admitted for rectal bleeding.  She has had some further rectal bleeding today, not yet formed stool, will watch overnight, hopefully tomorrow if she has formed stool, or her pain is better and hemoglobin is stable we will plan to discharge with close GI follow-up        Author: Edwin Dada, MD 05/17/2022 5:59 PM  For on call review [email or web address].

## 2022-05-17 NOTE — Progress Notes (Signed)
  Transition of Care [facility]) Screening Note   Patient Details  Name: [name] Date of Birth: [birth year]   Transition of Care [facility]) CM/SW Contact:    Lennart Pall, LCSW Phone Number: 05/17/2022, 3:27 PM    Transition of Care Department [facility]) has reviewed patient and no TOC needs have been identified at this time. We will continue to monitor patient advancement through interdisciplinary progression rounds. If new patient transition needs arise, please place a TOC consult.

## 2022-05-18 DIAGNOSIS — M1990 Unspecified osteoarthritis, unspecified site: Secondary | ICD-10-CM | POA: Diagnosis present

## 2022-05-18 DIAGNOSIS — F419 Anxiety disorder, unspecified: Secondary | ICD-10-CM | POA: Diagnosis present

## 2022-05-18 DIAGNOSIS — G8929 Other chronic pain: Secondary | ICD-10-CM | POA: Diagnosis present

## 2022-05-18 DIAGNOSIS — Z6831 Body mass index (BMI) 31.0-31.9, adult: Secondary | ICD-10-CM | POA: Diagnosis not present

## 2022-05-18 DIAGNOSIS — K219 Gastro-esophageal reflux disease without esophagitis: Secondary | ICD-10-CM | POA: Diagnosis present

## 2022-05-18 DIAGNOSIS — M549 Dorsalgia, unspecified: Secondary | ICD-10-CM | POA: Diagnosis present

## 2022-05-18 DIAGNOSIS — K922 Gastrointestinal hemorrhage, unspecified: Secondary | ICD-10-CM | POA: Diagnosis present

## 2022-05-18 DIAGNOSIS — K648 Other hemorrhoids: Secondary | ICD-10-CM | POA: Diagnosis present

## 2022-05-18 DIAGNOSIS — K5730 Diverticulosis of large intestine without perforation or abscess without bleeding: Secondary | ICD-10-CM | POA: Diagnosis present

## 2022-05-18 DIAGNOSIS — K633 Ulcer of intestine: Secondary | ICD-10-CM | POA: Diagnosis present

## 2022-05-18 DIAGNOSIS — I10 Essential (primary) hypertension: Secondary | ICD-10-CM | POA: Diagnosis present

## 2022-05-18 DIAGNOSIS — E785 Hyperlipidemia, unspecified: Secondary | ICD-10-CM | POA: Diagnosis present

## 2022-05-18 DIAGNOSIS — E876 Hypokalemia: Secondary | ICD-10-CM | POA: Diagnosis present

## 2022-05-18 DIAGNOSIS — K921 Melena: Secondary | ICD-10-CM | POA: Diagnosis not present

## 2022-05-18 DIAGNOSIS — N3010 Interstitial cystitis (chronic) without hematuria: Secondary | ICD-10-CM | POA: Diagnosis present

## 2022-05-18 DIAGNOSIS — K6389 Other specified diseases of intestine: Secondary | ICD-10-CM | POA: Diagnosis not present

## 2022-05-18 DIAGNOSIS — E860 Dehydration: Secondary | ICD-10-CM | POA: Diagnosis present

## 2022-05-18 DIAGNOSIS — R55 Syncope and collapse: Secondary | ICD-10-CM | POA: Diagnosis present

## 2022-05-18 DIAGNOSIS — E1165 Type 2 diabetes mellitus with hyperglycemia: Secondary | ICD-10-CM | POA: Diagnosis present

## 2022-05-18 DIAGNOSIS — F32A Depression, unspecified: Secondary | ICD-10-CM | POA: Diagnosis present

## 2022-05-18 DIAGNOSIS — K625 Hemorrhage of anus and rectum: Secondary | ICD-10-CM | POA: Diagnosis present

## 2022-05-18 DIAGNOSIS — K2270 Barrett's esophagus without dysplasia: Secondary | ICD-10-CM | POA: Diagnosis present

## 2022-05-18 DIAGNOSIS — K558 Other vascular disorders of intestine: Secondary | ICD-10-CM | POA: Diagnosis present

## 2022-05-18 DIAGNOSIS — K5731 Diverticulosis of large intestine without perforation or abscess with bleeding: Secondary | ICD-10-CM | POA: Diagnosis not present

## 2022-05-18 DIAGNOSIS — Z87442 Personal history of urinary calculi: Secondary | ICD-10-CM | POA: Diagnosis not present

## 2022-05-18 DIAGNOSIS — Z794 Long term (current) use of insulin: Secondary | ICD-10-CM | POA: Diagnosis not present

## 2022-05-18 DIAGNOSIS — E669 Obesity, unspecified: Secondary | ICD-10-CM | POA: Diagnosis present

## 2022-05-18 DIAGNOSIS — N361 Urethral diverticulum: Secondary | ICD-10-CM | POA: Diagnosis present

## 2022-05-18 DIAGNOSIS — E871 Hypo-osmolality and hyponatremia: Secondary | ICD-10-CM | POA: Diagnosis present

## 2022-05-18 LAB — GASTROINTESTINAL PANEL BY PCR, STOOL (REPLACES STOOL CULTURE)

## 2022-05-18 LAB — CBC
HCT: 35.8 % — ABNORMAL LOW (ref 36.0–46.0)
Hemoglobin: 12 g/dL (ref 12.0–15.0)
MCH: 30.9 pg (ref 26.0–34.0)
MCHC: 33.5 g/dL (ref 30.0–36.0)
MCV: 92.3 fL (ref 80.0–100.0)
Platelets: 296 10*3/uL (ref 150–400)
RBC: 3.88 MIL/uL (ref 3.87–5.11)
RDW: 12.7 % (ref 11.5–15.5)
WBC: 8.1 10*3/uL (ref 4.0–10.5)
nRBC: 0 % (ref 0.0–0.2)

## 2022-05-18 LAB — GLUCOSE, CAPILLARY
Glucose-Capillary: 198 mg/dL — ABNORMAL HIGH (ref 70–99)
Glucose-Capillary: 275 mg/dL — ABNORMAL HIGH (ref 70–99)
Glucose-Capillary: 99 mg/dL (ref 70–99)
Glucose-Capillary: 202 mg/dL — ABNORMAL HIGH (ref 70–99)

## 2022-05-18 MED ORDER — PEG-KCL-NACL-NASULF-NA ASC-C 100 G PO SOLR
0.5000 | Freq: Once | ORAL | Status: AC
  Administered 2022-05-19: 100 g via ORAL

## 2022-05-18 MED ORDER — BISACODYL 10 MG RE SUPP
10.0000 mg | Freq: Once | RECTAL | Status: AC
  Administered 2022-05-18: 10 mg via RECTAL
  Filled 2022-05-18: qty 1

## 2022-05-18 MED ORDER — PEG-KCL-NACL-NASULF-NA ASC-C 100 G PO SOLR: 1.0000 | Freq: Once | ORAL | Status: DC

## 2022-05-18 MED ORDER — SODIUM CHLORIDE 0.9 % IV SOLN: INTRAVENOUS | Status: DC

## 2022-05-18 MED ORDER — PEG-KCL-NACL-NASULF-NA ASC-C 100 G PO SOLR
0.5000 | Freq: Once | ORAL | Status: AC
  Administered 2022-05-18: 100 g via ORAL
  Filled 2022-05-18: qty 1

## 2022-05-18 NOTE — Progress Notes (Signed)
Subjective: Recurrent crampy abdominal pain and blood in stool with clots.  Objective: Vital signs in last 24 hours: Temp:  [98 F (36.7 C)-98.3 F (36.8 C)] 98 F (36.7 C) (07/19 0506) Pulse Rate:  [60-73] 60 (07/19 0506) Resp:  [16-18] 16 (07/19 0506) BP: (120-134)/(71-83) 123/71 (07/19 0506) SpO2:  [97 %-100 %] 97 % (07/19 0506) Weight change:  Last BM Date : 05/18/22  PE: GEN: NAD ABD: Soft, protuberant, mild generalized tenderness without peritonitis  Lab Results: CMP     Component Value Date/Time   NA 139 05/17/2022 0342   K 3.6 05/17/2022 0342   CL 104 05/17/2022 0342   CO2 26 05/17/2022 0342   GLUCOSE 173 (H) 05/17/2022 0342   BUN 10 05/17/2022 0342   CREATININE 0.65 05/17/2022 0342   CALCIUM 8.9 05/17/2022 0342   PROT 7.7 05/16/2022 1147   ALBUMIN 4.2 05/16/2022 1147   AST 29 05/16/2022 1147   ALT 32 05/16/2022 1147   ALKPHOS 74 05/16/2022 1147   BILITOT 0.9 05/16/2022 1147   GFRNONAA >60 05/17/2022 0342   GFRAA >60 12/14/2018 0835  CBC    Component Value Date/Time   WBC 8.1 05/18/2022 0325   RBC 3.88 05/18/2022 0325   HGB 12.0 05/18/2022 0325   HCT 35.8 (L) 05/18/2022 0325   PLT 296 05/18/2022 0325   MCV 92.3 05/18/2022 0325   MCH 30.9 05/18/2022 0325   MCHC 33.5 05/18/2022 0325   RDW 12.7 05/18/2022 0325   LYMPHSABS 2.7 02/27/2022 0814   MONOABS 0.7 02/27/2022 0814   EOSABS 0.4 02/27/2022 0814   BASOSABS 0.1 02/27/2022 0814   Assessment:   Diarrhea and blood in stool.  C. Diff and GI pathogen panel negative. Abnormal CT, possible descending colon thickening.  Plan:   De-escalate diet to clear liquids.  Colon prep today and colonoscopy tomorrow. Risks (bleeding, infection, bowel perforation that could require surgery, sedation-related changes in cardiopulmonary systems), benefits (identification and possible treatment of source of symptoms, exclusion of certain causes of symptoms), and alternatives (watchful waiting, radiographic imaging  studies, empiric medical treatment) of colonoscopy were explained to patient/family in detail and patient wishes to proceed.  [facility] will follow.   Landry Dyke 05/18/2022, 11:45 AM   Cell [phone] If no answer or after 5 PM call [phone]

## 2022-05-18 NOTE — Plan of Care (Signed)
  Problem: Education: Goal: Ability to describe self-care measures that may prevent or decrease complications (Diabetes Survival Skills Education) will improve Outcome: Progressing   Problem: Education: Goal: Knowledge of General Education information will improve Description: Including pain rating scale, medication(s)/side effects and non-pharmacologic comfort measures Outcome: Progressing   Problem: Activity: Goal: Risk for activity intolerance will decrease Outcome: Progressing   

## 2022-05-18 NOTE — Progress Notes (Addendum)
  Progress Note   Patient: [name] [id number]:[id number] DOB: [birth year] DOA: 05/16/2022     0 DOS: the patient was seen and examined on 05/18/2022        Brief hospital course: [name] is a 61 y.o. 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  In the ER, she had gross hematochezia, was given antibiotics and fluids and the hospitalists were consulted.        Assessment and Plan:  * Hematochezia/ab pain -reports symptom started after she took abx for urethral diverticulum recently -CT ab "Possible descending colon wall thickening with evaluation limited by underdistention. This may reflect colitis." GI prc panel negative, c diff negative - Hold aspirin - Trend Hgb -GI plan to scope tomorrow  Barrett's esophagus - Continue PPI -[place] GI [place]   Urethral diverticulum INTERSTITIAL CYSTITIS Patient concerned that her gi symptoms started after starting cephalexin.  Was previously on nitrofurantoin.  This care is guided by her Urologist. - Hold antibiotics for now  Hypokalemia improved, mag normal  Hyponatremia Na normalized   Essential hypertension BP normal - Continue home amlodipine -R esume HCTZ at discharge  Controlled type 2 diabetes mellitus without complication, with long-term current use of insulin (HCC) Glucose controlled - Hold Ozempic, Tresiba - Hold aspirin - Continue Sliding scale correction insulin  OBESITY Body mass index is 31.8 kg/m.   Depression, unspecified - Continue fluoxetine and duloxetine        Subjective:  Frustrated, c/o to have ab  crampy pain , had another bloody bm, currently no n/v, no fever Reports feeling weak No fever, wbc normalized Husband at bedside       Physical Exam: Vitals:   05/17/22 0637 05/17/22 1300 05/17/22 2144 05/18/22 0506  BP: 126/77  120/83 134/82 123/71  Pulse: 64 73 68 60  Resp: '18 18 16 16  '$ Temp: 97.8 F (36.6 C) 98.3 F (36.8 C) 98 F (36.7 C) 98 F (36.7 C)  TempSrc: Oral Oral Oral Oral  SpO2: 98% 100% 99% 97%  Weight:      Height:       Adult female, lying in bed, no acute distress RRR, no murmurs, no peripheral edema Respiratory rate normal, lungs clear without rales or wheezes Abdomen soft with diffuse tenderness, voluntary guarding noted, no ascites or distention, no masses Attention normal, affect normal, judgment insight appear normal  Data Reviewed: Hemogram shows stable hemoglobin 13, slight drop from yesterday from 14 Basic metabolic panel shows normal sodium, potassium, creatinine normal   Family Communication: Husband at the bedside    Disposition: The patient was admitted for rectal bleeding.  She has had some further rectal bleeding  and continued cramping today, gi plan to scope        Author: Florencia Reasons, MD PhD FACP 05/18/2022 11:48 AM  For on call review [email or web address].

## 2022-05-19 ENCOUNTER — Encounter (HOSPITAL_COMMUNITY): Payer: Self-pay | Admitting: Internal Medicine

## 2022-05-19 ENCOUNTER — Inpatient Hospital Stay (HOSPITAL_COMMUNITY): Payer: Medicare Other | Admitting: Registered Nurse

## 2022-05-19 ENCOUNTER — Encounter (HOSPITAL_COMMUNITY)
Admission: EM | Disposition: A | Discharge status: Home / Self Care | Payer: Self-pay | Source: Home / Self Care | Attending: Family Medicine

## 2022-05-19 DIAGNOSIS — K648 Other hemorrhoids: Secondary | ICD-10-CM

## 2022-05-19 DIAGNOSIS — K5731 Diverticulosis of large intestine without perforation or abscess with bleeding: Secondary | ICD-10-CM

## 2022-05-19 DIAGNOSIS — K921 Melena: Secondary | ICD-10-CM | POA: Diagnosis not present

## 2022-05-19 DIAGNOSIS — K6389 Other specified diseases of intestine: Secondary | ICD-10-CM

## 2022-05-19 DIAGNOSIS — K633 Ulcer of intestine: Secondary | ICD-10-CM

## 2022-05-19 HISTORY — PX: COLONOSCOPY WITH PROPOFOL: SHX5780

## 2022-05-19 HISTORY — PX: BIOPSY: SHX5522

## 2022-05-19 LAB — CBC WITH DIFFERENTIAL/PLATELET
Abs Immature Granulocytes: 0.03 10*3/uL (ref 0.00–0.07)
Basophils Absolute: 0.1 10*3/uL (ref 0.0–0.1)
Basophils Relative: 1 %
Eosinophils Absolute: 0.4 10*3/uL (ref 0.0–0.5)
Eosinophils Relative: 5 %
HCT: 36.9 % (ref 36.0–46.0)
Hemoglobin: 12.2 g/dL (ref 12.0–15.0)
Immature Granulocytes: 0 %
Lymphocytes Relative: 40 %
Lymphs Abs: 3.1 10*3/uL (ref 0.7–4.0)
MCH: 30.7 pg (ref 26.0–34.0)
MCHC: 33.1 g/dL (ref 30.0–36.0)
MCV: 92.9 fL (ref 80.0–100.0)
Monocytes Absolute: 0.6 10*3/uL (ref 0.1–1.0)
Monocytes Relative: 7 %
Neutro Abs: 3.7 10*3/uL (ref 1.7–7.7)
Neutrophils Relative %: 47 %
Platelets: 305 10*3/uL (ref 150–400)
RBC: 3.97 MIL/uL (ref 3.87–5.11)
RDW: 12.7 % (ref 11.5–15.5)
WBC: 7.8 10*3/uL (ref 4.0–10.5)
nRBC: 0 % (ref 0.0–0.2)

## 2022-05-19 LAB — BASIC METABOLIC PANEL
Anion gap: 9 (ref 5–15)
BUN: 10 mg/dL (ref 6–20)
CO2: 25 mmol/L (ref 22–32)
Calcium: 8.7 mg/dL — ABNORMAL LOW (ref 8.9–10.3)
Chloride: 107 mmol/L (ref 98–111)
Creatinine, Ser: 0.62 mg/dL (ref 0.44–1.00)
GFR, Estimated: >60 mL/min (ref >60)
Glucose, Bld: 130 mg/dL — ABNORMAL HIGH (ref 70–99)
Potassium: 3.9 mmol/L (ref 3.5–5.1)
Sodium: 141 mmol/L (ref 135–145)

## 2022-05-19 LAB — MAGNESIUM: Magnesium: 2.1 mg/dL (ref 1.7–2.4)

## 2022-05-19 LAB — GLUCOSE, CAPILLARY
Glucose-Capillary: 160 mg/dL — ABNORMAL HIGH (ref 70–99)
Glucose-Capillary: 140 mg/dL — ABNORMAL HIGH (ref 70–99)
Glucose-Capillary: 122 mg/dL — ABNORMAL HIGH (ref 70–99)

## 2022-05-19 SURGERY — COLONOSCOPY WITH PROPOFOL
Anesthesia: Monitor Anesthesia Care

## 2022-05-19 MED ORDER — LACTATED RINGERS IV SOLN
INTRAVENOUS | Status: AC | PRN
  Administered 2022-05-19: 20 mL/h via INTRAVENOUS

## 2022-05-19 MED ORDER — CEPHALEXIN 500 MG PO CAPS: 500.0000 mg | ORAL_CAPSULE | Freq: Three times a day (TID) | ORAL | Status: DC

## 2022-05-19 MED ORDER — NITROFURANTOIN MONOHYD MACRO 100 MG PO CAPS: 100.0000 mg | ORAL_CAPSULE | Freq: Every day | ORAL | Status: DC

## 2022-05-19 MED ORDER — LIDOCAINE 2% (20 MG/ML) 5 ML SYRINGE
INTRAMUSCULAR | Status: DC | PRN
  Administered 2022-05-19: 100 mg via INTRAVENOUS

## 2022-05-19 MED ORDER — PROPOFOL 500 MG/50ML IV EMUL
INTRAVENOUS | Status: DC | PRN
  Administered 2022-05-19: 130 ug/kg/min via INTRAVENOUS

## 2022-05-19 MED ORDER — ASPIRIN 81 MG PO CHEW: 81.0000 mg | CHEWABLE_TABLET | Freq: Two times a day (BID) | ORAL | 0 refills | Status: DC

## 2022-05-19 MED ORDER — PROPOFOL 10 MG/ML IV BOLUS
INTRAVENOUS | Status: DC | PRN
  Administered 2022-05-19 (×2): 20 mg via INTRAVENOUS
  Administered 2022-05-19: 10 mg via INTRAVENOUS

## 2022-05-19 SURGICAL SUPPLY — 22 items

## 2022-05-19 NOTE — Anesthesia Preprocedure Evaluation (Signed)
Anesthesia Evaluation  Patient identified by MRN, date of birth, ID band Patient awake    Reviewed: Allergy & Precautions, NPO status , Patient's Chart, lab work & pertinent test results  History of Anesthesia Complications (+) PONV and history of anesthetic complications  Airway Mallampati: II  TM Distance: >3 FB Neck ROM: Full    Dental  (+) Teeth Intact, Dental Advisory Given   Pulmonary former smoker,    breath sounds clear to auscultation       Cardiovascular hypertension, Pt. on medications  Rhythm:Regular Rate:Normal     Neuro/Psych  Headaches, PSYCHIATRIC DISORDERS Anxiety Depression    GI/Hepatic Neg liver ROS, GERD  Medicated,  Endo/Other  diabetes, Type 2, Insulin Dependent  Renal/GU negative Renal ROS     Musculoskeletal  (+) Arthritis ,   Abdominal Normal abdominal exam  (+)   Peds  Hematology negative hematology ROS (+)   Anesthesia Other Findings   Reproductive/Obstetrics                             Anesthesia Physical Anesthesia Plan  ASA: 2  Anesthesia Plan: MAC   Post-op Pain Management:    Induction: Intravenous  PONV Risk Score and Plan: 0 and Propofol infusion  Airway Management Planned: Natural Airway and Simple Face Mask  Additional Equipment: None  Intra-op Plan:   Post-operative Plan:   Informed Consent: I have reviewed the patients History and Physical, chart, labs and discussed the procedure including the risks, benefits and alternatives for the proposed anesthesia with the patient or authorized representative who has indicated his/her understanding and acceptance.       Plan Discussed with: CRNA  Anesthesia Plan Comments: (She denies taking Ozempic within the last week. Will proceed with MAC with low threshold to convert to GA.)       Anesthesia Quick Evaluation

## 2022-05-19 NOTE — Plan of Care (Signed)

## 2022-05-19 NOTE — Discharge Summary (Signed)
Discharge Summary  [name] TGG:[id number] DOB: 12-11-1960  PCP: Ardith Dark, PA-C  Admit date: 05/16/2022 Discharge date: 05/19/2022  Time spent:  25mns  Recommendations for Outpatient Follow-up:  F/u with PCP within a week  for hospital discharge follow up, repeat cbc/bmp at follow up F/u with Gi Dr OPaulita Fujita biopsy result pending     Discharge Diagnoses:  Active Hospital Problems   Diagnosis Date Noted   Hematochezia 05/16/2022    Priority: 1.   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    Discharge Condition: stable  Diet recommendation: heart healthy/carb modified  Filed Weights   05/16/22 1126  Weight: 89.4 kg    History of present illness: ( per admitting MD Dr DLoleta Books [name] a 61y.o. 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   Patient was in her usual state of health until 3 days ago, her antibiotics were switched to cephalexin.  The next day she had abdominal cramps, anorexia, malaise and nausea, and then that night had an episode of severe nausea and vomiting, voluminous soft bowel movement with streaks of blood, then crampy colicky abdominal pain.   Then overnight last night, she had the urge to defecate and the toilet bowl was full of "all blood", no stool.  She had another grossly bloody bowel movement after that, and then went to her PCP who sent her to the ER.  In the ER, heart rate and  blood pressure normal.  Hemoglobin 14.9 from baseline 13.5.  Abdomen and pelvis showed possible wall thickening of the descending colon.  She was given fluids and antibiotics and the hospital service were asked to evaluate for hematochezia.    Hospital Course:  Principal Problem:   Hematochezia Active Problems:   Controlled type 2 diabetes mellitus without complication, with long-term current use of insulin (HCC)   OBESITY   Depression, unspecified   Essential hypertension   INTERSTITIAL CYSTITIS   Barrett's esophagus   Hyponatremia   Hypokalemia   Urethral diverticulum   GI bleed   Assessment and Plan:  * Hematochezia/ab pain -reports symptom started after she took abx for urethral diverticulum recently -CT ab "Possible descending colon wall thickening with evaluation limited by underdistention. This may reflect colitis." -GI prc panel negative, c diff negative - Hold aspirin if active bleeding  -  Hgb has been stable, bleeding slowed down -[facility] S/p colonoscopy, per Dr oPaulita Fujita likely ischemic colitis in the setting of dehydration/volume depletion -she is cleared to discharge home per GI, patient desires to go home as well, she desires to follow up with Gi Dr oPaulita Fujita   Barrett's esophagus - Continue PPI -followed with GI    Urethral diverticulum INTERSTITIAL CYSTITIS Patient concerned that her gi symptoms started after starting cephalexin.  Was previously on nitrofurantoin.  This care is guided by her Urologist. - Hold antibiotics for now, f/u with urology   Hypokalemia improved, mag normal   Hyponatremia Na normalized     Essential  hypertension BP normal - Continue home amlodipine -Resume HCTZ at discharge  -f/u with pcp  Controlled type 2 diabetes mellitus without complication, with long-term current use of insulin (HCC) Glucose controlled -  Ozempic, Tresiba held in the hospital, resumed at discharge - f/u with pcp   OBESITY Body  mass index is 31.8 kg/m.  on ozempic   Depression, unspecified - Continue fluoxetine and duloxetine        Discharge Exam: BP 134/81   Pulse 63   Temp (!) 97.5 F (36.4 C) (Temporal)   Resp 13   Ht '5\' 6"'$  (1.676 m)   Wt 89.4 kg   SpO2 100%   BMI 31.80 kg/m   General: NAD Cardiovascular: RRR Respiratory: normal respiratory effort    Discharge Instructions     Diet - low sodium heart healthy   Complete by: As directed    Carb modified   Increase activity slowly   Complete by: As directed       Allergies as of 05/19/2022       Reactions   Latex Anaphylaxis   Penicillins Anaphylaxis, Other (See Comments)   Has patient had a PCN reaction causing immediate rash, facial/tongue/throat swelling, SOB or lightheadedness with hypotension: No Has patient had a PCN reaction causing severe rash involving mucus membranes or skin necrosis: No Has patient had a PCN reaction that required hospitalization No Has patient had a PCN reaction occurring within the last 10 years: No If all of the above answers are "NO", then may proceed with Cephalosporin use.   Erythromycin Hives, Itching   Ofloxacin Hives, Itching   Sulfonamide Derivatives Hives, Itching   Azithromycin Other (See Comments)   "makes my heart rate go crazy"   Fenofibrate Other (See Comments)   Cramps   Statins Nausea And Vomiting   Cramps        Medication List     STOP taking these medications    acetaminophen 325 MG tablet Commonly known as: TYLENOL       TAKE these medications    amLODipine 5 MG tablet Commonly known as: NORVASC Take 5 mg by mouth daily.   aspirin 81 MG chewable tablet Chew 1 tablet (81 mg total) by mouth 2 (two) times daily. Hold asa if you have active bleeding What changed: additional instructions   cephALEXin 500 MG capsule Commonly known as: KEFLEX Take 1 capsule (500 mg total) by mouth 3 (three) times daily. Hold for a week What changed: additional instructions    cyclobenzaprine 10 MG tablet Commonly known as: FLEXERIL Take 1 tablet (10 mg total) by mouth 3 (three) times daily as needed for muscle spasms.   DULoxetine 20 MG capsule Commonly known as: CYMBALTA Take 20 mg by mouth daily.   ezetimibe 10 MG tablet Commonly known as: ZETIA Take 10 mg by mouth daily.   FLUoxetine 40 MG capsule Commonly known as: PROZAC Take 1 capsule (40 mg total) by mouth daily. NEEDS APPT FOR REFILLS What changed: additional instructions   hydrochlorothiazide 25 MG tablet Commonly known as: HYDRODIURIL Take 1 tablet (25 mg total) by mouth daily. NEEDS APPT FOR REFILLS What changed: additional instructions   HYDROcodone-acetaminophen 5-325 MG tablet Commonly known as: NORCO/VICODIN 1 po q 4-6hrs prn pain What changed:  how much to take how to take this when to take this reasons to take this additional instructions Another medication with the same name was removed. Continue taking this medication, and follow the directions you see here.   insulin  lispro 100 UNIT/ML injection Commonly known as: HUMALOG Inject 2 Units into the skin See admin instructions. Inject 2 units SQ up to 6 times daily as needed for BS > 250   nitrofurantoin (macrocrystal-monohydrate) 100 MG capsule Commonly known as: MACROBID Take 1 capsule (100 mg total) by mouth daily. Hold for a week What changed: additional instructions   Ozempic (1 MG/DOSE) 4 MG/3ML Sopn Generic drug: Semaglutide (1 MG/DOSE) Inject 1 mg into the skin once a week.   pantoprazole 40 MG tablet Commonly known as: PROTONIX Take 40 mg by mouth 2 (two) times daily.   potassium chloride 10 MEQ tablet Commonly known as: KLOR-CON Take 10 mEq by mouth 2 (two) times daily.   Tyler Aas FlexTouch 100 UNIT/ML FlexTouch Pen Generic drug: insulin degludec Inject 50 Units into the skin daily.       Allergies  Allergen Reactions   Latex Anaphylaxis   Penicillins Anaphylaxis and Other (See Comments)    Has  patient had a PCN reaction causing immediate rash, facial/tongue/throat swelling, SOB or lightheadedness with hypotension: No Has patient had a PCN reaction causing severe rash involving mucus membranes or skin necrosis: No Has patient had a PCN reaction that required hospitalization No Has patient had a PCN reaction occurring within the last 10 years: No If all of the above answers are "NO", then may proceed with Cephalosporin use.   Erythromycin Hives and Itching   Ofloxacin Hives and Itching   Sulfonamide Derivatives Hives and Itching   Azithromycin Other (See Comments)    "makes my heart rate go crazy"   Fenofibrate Other (See Comments)    Cramps   Statins Nausea And Vomiting    Cramps    Follow-up Information     Hedgecock, Vinnie Level, PA-C Follow up in 1 week(s).   Specialty: Physician Assistant Why: Hospital discharge follow-up, repeat basic lab works including CBC/ BMP at follow-up Contact information: [address] [place] [address] [phone]         Arta Silence, MD Follow up.   Specialty: Gastroenterology Contact information: [address] [place] [address] [phone]                  The results of significant diagnostics from this hospitalization (including imaging, microbiology, ancillary and laboratory) are listed below for reference.    Significant Diagnostic Studies: CT ABDOMEN PELVIS W CONTRAST  Result Date: 05/16/2022 CLINICAL DATA:  Abdominal pain, acute, nonlocalized EXAM: CT ABDOMEN AND PELVIS WITH CONTRAST TECHNIQUE: Multidetector CT imaging of the abdomen and pelvis was performed using the standard protocol following bolus administration of intravenous contrast. RADIATION DOSE REDUCTION: This exam was performed according to the departmental dose-optimization program which includes automated exposure control, adjustment of the mA and/or kV according to patient size and/or use of iterative  reconstruction technique. CONTRAST:  150m OMNIPAQUE IOHEXOL 300 MG/ML  SOLN COMPARISON:  02/27/2022 FINDINGS: Lower chest: No acute abnormality. Hepatobiliary: No focal liver abnormality is seen. Status post cholecystectomy. Stable biliary dilatation. Pancreas: Unremarkable. Spleen: Unremarkable. Adrenals/Urinary Tract: Adrenals, kidneys, and poorly distended bladder are unremarkable. 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 Stomach/Bowel: Stomach is within normal limits. Bowel is normal in caliber. Question of wall thickening along the underdistended descending colon. Appendectomy. Vascular/Lymphatic: Atherosclerosis.  No enlarged nodes. Reproductive: Status post hysterectomy. No adnexal masses. Other: No free fluid. Musculoskeletal: Postoperative changes at L2-L5 and right sacroiliac joint. IMPRESSION: Possible descending colon wall thickening with evaluation limited by underdistention. This may reflect  colitis. 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 Electronically Signed   By: Macy Mis M.D.   On: 05/16/2022 14:23    Microbiology: Recent Results (from the past 240 hour(s))  C Difficile Quick Screen w PCR reflex     Status: None   Collection Time: 05/17/22  4:34 PM   Specimen: STOOL  Result Value Ref Range Status   C Diff antigen NEGATIVE NEGATIVE Final   C Diff toxin NEGATIVE NEGATIVE Final   C Diff interpretation No C. difficile detected.  Final    Comment: Performed at [facility], [place] [address], [place], [place] [address]  Gastrointestinal Panel by PCR , Stool     Status: None   Collection Time: 05/17/22  4:34 PM   Specimen: Stool  Result Value Ref Range Status   Campylobacter species NOT DETECTED NOT DETECTED Final   Plesimonas shigelloides NOT DETECTED NOT DETECTED Final   Salmonella species NOT DETECTED NOT DETECTED Final   Yersinia enterocolitica NOT DETECTED NOT DETECTED Final   Vibrio species NOT DETECTED NOT DETECTED Final   Vibrio  cholerae NOT DETECTED NOT DETECTED Final   Enteroaggregative E coli (EAEC) NOT DETECTED NOT DETECTED Final   Enteropathogenic E coli (EPEC) NOT DETECTED NOT DETECTED Final   Enterotoxigenic E coli (ETEC) NOT DETECTED NOT DETECTED Final   Shiga like toxin producing E coli (STEC) NOT DETECTED NOT DETECTED Final   Shigella/Enteroinvasive E coli (EIEC) NOT DETECTED NOT DETECTED Final   Cryptosporidium NOT DETECTED NOT DETECTED Final   Cyclospora cayetanensis NOT DETECTED NOT DETECTED Final   Entamoeba histolytica NOT DETECTED NOT DETECTED Final   Giardia lamblia NOT DETECTED NOT DETECTED Final   Adenovirus F40/41 NOT DETECTED NOT DETECTED Final   Astrovirus NOT DETECTED NOT DETECTED Final   Norovirus GI/GII NOT DETECTED NOT DETECTED Final   Rotavirus A NOT DETECTED NOT DETECTED Final   Sapovirus (I, II, IV, and V) NOT DETECTED NOT DETECTED Final    Comment: Performed at [facility], [address] [place], [place] [address]     Labs: Basic Metabolic Panel: Recent Labs  Lab 05/16/22 1147 05/17/22 0342 05/19/22 0343  NA 134* 139 141  K 3.0* 3.6 3.9  CL 98 104 107  CO2 '24 26 25  '$ GLUCOSE 199* 173* 130*  BUN '14 10 10  '$ CREATININE 0.82 0.65 0.62  CALCIUM 9.5 8.9 8.7*  MG  --  2.0 2.1   Liver Function Tests: Recent Labs  Lab 05/16/22 1147  AST 29  ALT 32  ALKPHOS 74  BILITOT 0.9  PROT 7.7  ALBUMIN 4.2   Recent Labs  Lab 05/16/22 1147  LIPASE 27   No results for input(s): "AMMONIA" in the last 168 hours. CBC: Recent Labs  Lab 05/16/22 1147 05/17/22 0342 05/18/22 0325 05/19/22 0343  WBC 11.3* 8.5 8.1 7.8  NEUTROABS  --   --   --  3.7  HGB 14.9 13.1 12.0 12.2  HCT 42.7 39.1 35.8* 36.9  MCV 89.1 92.0 92.3 92.9  PLT 391 311 296 305   Cardiac Enzymes: No results for input(s): "CKTOTAL", "CKMB", "CKMBINDEX", "TROPONINI" in the last 168 hours. BNP: BNP (last 3 results) No results for input(s): "BNP" in the last 8760 hours.  ProBNP (last 3 results) No  results for input(s): "PROBNP" in the last 8760 hours.  CBG: Recent Labs  Lab 05/18/22 1129 05/18/22 1619 05/18/22 2024 05/19/22 0751 05/19/22 1202  GLUCAP 275* 99 202* 160* 140*    FURTHER DISCHARGE INSTRUCTIONS:  Get Medicines reviewed and adjusted: Please take all your medications with you for your next visit with your Primary MD   Laboratory/radiological data: Please request your Primary MD to go over all hospital tests and procedure/radiological results at the follow up, please ask your Primary MD to get all Hospital records sent to his/her office.   In some cases, they will be blood work, cultures and biopsy results pending at the time of your discharge. Please request that your primary care M.D. goes through all the records of your hospital data and follows up on these results.   Also Note the following: If you experience worsening of your admission symptoms, develop shortness of breath, life threatening emergency, suicidal or homicidal thoughts you must seek medical attention immediately by calling 911 or calling your MD immediately  if symptoms less severe.   You must read complete instructions/literature along with all the possible adverse reactions/side effects for all the Medicines you take and that have been prescribed to you. Take any new Medicines after you have completely understood and accpet all the possible adverse reactions/side effects.    Do not drive when taking Pain medications or sleeping medications (Benzodaizepines)   Do not take more than prescribed Pain, Sleep and Anxiety Medications. It is not advisable to combine anxiety,sleep and pain medications without talking with your primary care practitioner   Special Instructions: If you have smoked or chewed Tobacco  in the last 2 yrs please stop smoking, stop any regular Alcohol  and or any Recreational drug use.   Wear Seat belts while driving.   Please note: You were cared for by a hospitalist during  your hospital stay. Once you are discharged, your primary care physician will handle any further medical issues. Please note that NO REFILLS for any discharge medications will be authorized once you are discharged, as it is imperative that you return to your primary care physician (or establish a relationship with a primary care physician if you do not have one) for your post hospital discharge needs so that they can reassess your need for medications and monitor your lab values.     Signed:  Florencia Reasons MD, PhD, FACP  [facility] 05/19/2022, 4:23 PM

## 2022-05-19 NOTE — Transfer of Care (Signed)
Immediate Anesthesia Transfer of Care Note  Patient: [name]  Procedure(s) Performed: COLONOSCOPY WITH PROPOFOL BIOPSY  Patient Location: Endoscopy Unit  Anesthesia Type:MAC  Level of Consciousness: drowsy and patient cooperative  Airway & Oxygen Therapy: Patient Spontanous Breathing and Patient connected to face mask oxygen  Post-op Assessment: Report given to RN and Post -op Vital signs reviewed and stable  Post vital signs: Reviewed and stable  Last Vitals:  Vitals Value Taken Time  BP 124/64 05/19/22 1524  Temp    Pulse 65 05/19/22 1525  Resp 12 05/19/22 1525  SpO2 100 % 05/19/22 1525  Vitals shown include unvalidated device data.  Last Pain:  Vitals:   05/19/22 1332  TempSrc: Temporal  PainSc: 7       Patients Stated Pain Goal: 2 (54/98/26 4158)  Complications: No notable events documented.

## 2022-05-19 NOTE — Discharge Instructions (Signed)

## 2022-05-19 NOTE — Interval H&P Note (Signed)
History and Physical Interval Note:  05/19/2022 2:19 PM  [name]  has presented today for surgery, with the diagnosis of hematochezia.  The various methods of treatment have been discussed with the patient and family. After consideration of risks, benefits and other options for treatment, the patient has consented to  Procedure(s): COLONOSCOPY WITH PROPOFOL (N/A) as a surgical intervention.  The patient's history has been reviewed, patient examined, no change in status, stable for surgery.  I have reviewed the patient's chart and labs.  Questions were answered to the patient's satisfaction.     Landry Dyke

## 2022-05-19 NOTE — Progress Notes (Signed)
  Progress Note   Patient: [name] OJJ:[id number] DOB: Nov 10, 1960 DOA: 05/16/2022     1 DOS: the patient was seen and examined on 05/19/2022        Brief hospital course: [name] is a 61 y.o. 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  In the ER, she had gross hematochezia, was given antibiotics and fluids and the hospitalists were consulted.        Assessment and Plan:  * Hematochezia/ab pain -reports symptom started after she took abx for urethral diverticulum recently -CT ab "Possible descending colon wall thickening with evaluation limited by underdistention. This may reflect colitis." GI prc panel negative, c diff negative - Hold aspirin - Trend Hgb -GI plan to scope tomorrow  Barrett's esophagus - Continue PPI -follow[place] with [place]   Urethral diverticulum INTERSTITIAL CYSTITIS Patient concerned that her gi symptoms started after starting cephalexin.  Was previously on nitrofurantoin.  This care is guided by her Urologist. - Hold antibiotics for now  Hypokalemia improved, mag normal  Hyponatremia Na normalized   Essential hypertension BP normal - Continue home amlodipine -R esume HCTZ at discharge  Controlled type 2 diabetes mellitus without complication, with long-term current use of insulin (HCC) Glucose controlled - Hold Ozempic, Tresiba - Hold aspirin - Continue Sliding scale correction insulin  OBESITY Body mass index is 31.8 kg/m.   Depression, unspecified - Continue fluoxetine and duloxetine        Subjective:  Still having cramping pain, tolerated bowel prep, reports still has blood in stool with clots, but seems less red,  Hgb stable,  Vital signs are stable, Wbc wnl She is npo awaiting for GI scope      Physical Exam: Vitals:   05/18/22 1347 05/18/22 2022  05/19/22 0529 05/19/22 1332  BP: 119/68 132/83 120/74 (!) 144/77  Pulse: 69 77 62 62  Resp:  '17 16 18  '$ Temp: 98 F (36.7 C) 97.9 F (36.6 C) 97.6 F (36.4 C) 97.8 F (36.6 C)  TempSrc:   Oral Temporal  SpO2: 99% 99% 98% 97%  Weight:      Height:       Adult female, lying in bed, no acute distress RRR, no murmurs, no peripheral edema Respiratory rate normal, lungs clear without rales or wheezes Abdomen soft with diffuse tenderness, voluntary guarding noted, no ascites or distention, no masses Attention normal, affect normal, judgment insight appear normal  Data Reviewed: Hemogram shows stable hemoglobin 13, slight drop from yesterday from 14 Basic metabolic panel shows normal sodium, potassium, creatinine normal   Family Communication: Husband at the bedside    Disposition: The patient was admitted for rectal bleeding.  She has had some further rectal bleeding  and continued cramping today, gi plan to scope        Author: Florencia Reasons, MD PhD FACP 05/19/2022 2:18 PM  For on call review [email or web address].

## 2022-05-19 NOTE — Progress Notes (Signed)
Patient being discharged home with husband. PIV removed per orders. Discharge education completed and all questions answered. Patient sent home with all personal belongings.

## 2022-05-19 NOTE — Op Note (Signed)
[facility] Patient Name: [name] Procedure Date: 05/19/2022 MRN: [id number] Attending MD: Arta Silence , MD Date of Birth: December 19, 1960 CSN: [id number] Age: 61 Admit Type: Inpatient Procedure:                Colonoscopy Indications:              Abdominal pain in the left lower quadrant,                            Hematochezia Providers:                Arta Silence, MD, Kary Kos RN, RN, Cherylynn Ridges, Technician, Courtney Heys. [place], CRNA Referring MD:             [facility] Medicines:                Monitored Anesthesia Care Complications:            No immediate complications. Estimated Blood Loss:     Estimated blood loss: none. Procedure:                Pre-Anesthesia Assessment:                           - Prior to the procedure, a History and Physical                            was performed, and patient medications and                            allergies were reviewed. The patient's tolerance of                            previous anesthesia was also reviewed. The risks                            and benefits of the procedure and the sedation                            options and risks were discussed with the patient.                            All questions were answered, and informed consent                            was obtained. Prior Anticoagulants: The patient has                            taken no previous anticoagulant or antiplatelet                            agents. ASA Grade Assessment: II - A patient with  mild systemic disease. After reviewing the risks                            and benefits, the patient was deemed in                            satisfactory condition to undergo the procedure.                           After obtaining informed consent, the colonoscope                            was passed under direct vision. Throughout the                             procedure, the patient's blood pressure, pulse, and                            oxygen saturations were monitored continuously. The                            [id number]-HQ190L ([id number]) Olympus colonoscope was                            introduced through the anus and advanced to the the                            cecum, identified by appendiceal orifice and                            ileocecal valve. The colonoscopy was performed                            without difficulty. The patient tolerated the                            procedure well. The quality of the bowel                            preparation was fair. Scope In: 2:37:50 PM Scope Out: 3:16:20 PM Scope Withdrawal Time: 0 hours 17 minutes 44 seconds  Total Procedure Duration: 0 hours 38 minutes 30 seconds  Findings:      Hemorrhoids were found on perianal exam.      Bowel prep diffusely fair; semisolid and viscous stool obscured some       views throughout colon; diminutive or subtle sessile polyps could easily       have been missed.      A diffuse area of mildly altered vascular, congested, erythematous and       ulcerated mucosa was found in the descending colon. Biopsies were taken       with a cold forceps for histology.      A few small-mouthed diverticula were found in the sigmoid colon.      Internal hemorrhoids were found during retroflexion. The hemorrhoids       were mild.  The exam was otherwise without abnormality on direct and retroflexion       views. Impression:               - Preparation of the colon was fair.                           - Hemorrhoids found on perianal exam.                           - Altered vascular, congested, erythematous and                            ulcerated mucosa in the descending colon. Biopsied.                           - Diverticulosis in the sigmoid colon.                           - Internal hemorrhoids.                           - The examination was otherwise normal on  direct                            and retroflexion views.                           - The examination was otherwise normal. Suspect                            resolving ischemic colitis as cause of patient's                            diarrhea, hematochezia, abdominal pain and abnormal                            CT scan (descending colon thickening). Moderate Sedation:      None Recommendation:           - Return patient to hospital ward for ongoing care.                           - Soft diet today.                           - Continue present medications.                           - Await pathology results.                           - Repeat colonoscopy (date not yet determined) for                            surveillance based on pathology results. Suspect 3  year repeat for routine colon cancer screening.                           - [facility] will follow. Procedure Code(s):        --- Professional ---                           [phone], Colonoscopy, flexible; with biopsy, single                            or multiple Diagnosis Code(s):        --- Professional ---                           K64.8, Other hemorrhoids                           K63.89, Other specified diseases of intestine                           K63.3, Ulcer of intestine                           R10.32, Left lower quadrant pain                           K92.1, Melena (includes Hematochezia)                           K57.30, Diverticulosis of large intestine without                            perforation or abscess without bleeding CPT copyright 2019 [employer]. All rights reserved. The codes documented in this report are preliminary and upon coder review may  be revised to meet current compliance requirements. Arta Silence, MD 05/19/2022 3:39:11 PM This report has been signed electronically. Number of Addenda: 0

## 2022-05-19 NOTE — Anesthesia Postprocedure Evaluation (Signed)
Anesthesia Post Note  Patient: [name]  Procedure(s) Performed: COLONOSCOPY WITH PROPOFOL BIOPSY     Patient location during evaluation: PACU Anesthesia Type: MAC Level of consciousness: awake and alert Pain management: pain level controlled Vital Signs Assessment: post-procedure vital signs reviewed and stable Respiratory status: spontaneous breathing, nonlabored ventilation, respiratory function stable and patient connected to nasal cannula oxygen Cardiovascular status: stable and blood pressure returned to baseline Postop Assessment: no apparent nausea or vomiting Anesthetic complications: no   No notable events documented.  Last Vitals:  Vitals:   05/19/22 1530 05/19/22 1540  BP: 129/72 134/81  Pulse: 66 63  Resp: 13 13  Temp:    SpO2: 100% 100%    Last Pain:  Vitals:   05/19/22 1540  TempSrc:   PainSc: 0-No pain                 Effie Berkshire

## 2022-05-20 LAB — SURGICAL PATHOLOGY

## 2022-05-23 ENCOUNTER — Encounter (HOSPITAL_COMMUNITY): Payer: Self-pay | Admitting: Gastroenterology

## 2022-08-15 ENCOUNTER — Encounter (HOSPITAL_BASED_OUTPATIENT_CLINIC_OR_DEPARTMENT_OTHER): Payer: Self-pay | Admitting: *Deleted

## 2022-08-19 ENCOUNTER — Ambulatory Visit (INDEPENDENT_AMBULATORY_CARE_PROVIDER_SITE_OTHER): Payer: Medicare Other | Admitting: Obstetrics and Gynecology

## 2022-08-19 ENCOUNTER — Encounter: Payer: Self-pay | Admitting: Obstetrics and Gynecology

## 2022-08-19 ENCOUNTER — Other Ambulatory Visit (HOSPITAL_COMMUNITY)
Admission: RE | Admit: 2022-08-19 | Discharge: 2022-08-19 | Disposition: A | Discharge status: Home / Self Care | Payer: Medicare Other | Source: Ambulatory Visit | Attending: Obstetrics and Gynecology | Admitting: Obstetrics and Gynecology

## 2022-08-19 VITALS — BP 133/81 | HR 91 | Ht 66.0 in | Wt 202.0 lb

## 2022-08-19 DIAGNOSIS — N3010 Interstitial cystitis (chronic) without hematuria: Secondary | ICD-10-CM

## 2022-08-19 DIAGNOSIS — N361 Urethral diverticulum: Secondary | ICD-10-CM | POA: Diagnosis not present

## 2022-08-19 DIAGNOSIS — R350 Frequency of micturition: Secondary | ICD-10-CM

## 2022-08-19 DIAGNOSIS — N393 Stress incontinence (female) (male): Secondary | ICD-10-CM | POA: Diagnosis not present

## 2022-08-19 DIAGNOSIS — K5904 Chronic idiopathic constipation: Secondary | ICD-10-CM

## 2022-08-19 DIAGNOSIS — N898 Other specified noninflammatory disorders of vagina: Secondary | ICD-10-CM | POA: Diagnosis not present

## 2022-08-19 LAB — POCT URINALYSIS DIPSTICK
Blood, UA: NEGATIVE
Glucose, UA: NEGATIVE
Leukocytes, UA: NEGATIVE
Nitrite, UA: NEGATIVE
Protein, UA: POSITIVE — AB
Spec Grav, UA: 1.025 (ref 1.010–1.025)
Urobilinogen, UA: 1 E.U./dL
pH, UA: 5.5 (ref 5.0–8.0)

## 2022-08-19 NOTE — Progress Notes (Signed)
[facility] New Patient Evaluation and Consultation  Referring Provider: Ardith Dark, PA-C PCP: Sheral Apley Date of Service: 08/19/2022  SUBJECTIVE Chief Complaint: New Patient (Initial Visit) [name] is a 61 y.o. female complains of urethral diverticulum. )  History of Present Illness: [name] is a 61 y.o. White or Caucasian female seen in consultation at the request of Dr. Almedia Balls for evaluation of Urethral diverticulum.    Review of records significant for:  Last A1c on 8/28 was 8.3 %  Pelvic MRI 03/15/22 There is a 1.8 cm right-sided urethral diverticulum, as queried by prior CT. Some associated peripheral contrast enhancement, suggesting infection or inflammation  Urinary Symptoms: Leaks urine with cough/ sneeze, exercise, with urgency, and while asleep Leaks off and on all day Pad use: 3-5 liners/ mini-pads per day.   She is bothered by her UI symptoms.  Day time voids- every few hours.  Nocturia: 5-10 times per night to void. Voiding dysfunction: she does not empty her bladder well.  does not use a catheter to empty bladder.  When urinating, she feels a weak stream, dribbling after finishing, and the need to urinate multiple times in a row. Has a lot of dribbling of urine.  Drinks: water, occasional decaf tea or orange juice  Has IC and regularly gets urinary tract infections. IC symptoms include bladder spasms, dysuria and increased urgency.  Urethral diverticulum was found in the beginning of the year. For a while she had constant discharge. Has a lot of systemic symptoms with infections. Has been treated with several rounds of antibiotics. Has not been coming and going.  UTIs: a few UTI's in the last year- all pertaining to discharge with the diverticulum.   Reports history of kidney or bladder stones  Pelvic Organ Prolapse Symptoms:                  She Admits to a feeling of a bulge the vaginal area. It has been  present for 8 months.  She Denies seeing a bulge.  This bulge is bothersome.  Bowel Symptom: Bowel movements: twice a week Stool consistency: hard Straining: yes.  Splinting: yes.  Incomplete evacuation: yes.  She Denies accidental bowel leakage / fecal incontinence Bowel regimen: stool softener Last colonoscopy: Date 04/2022, Results showed polyps  Sexual Function Sexually active: yes.  Sexual orientation: Straight Pain with sex: Yes  Pelvic Pain Admits to pelvic pain Location: deep in the pelvis, in the bladder and the pelvic floor Pain occurs: All the time, has deterred patient from having sexual intercourse Prior pain treatment: Denies Improved by: Nothing Worsened by: Sex   Past Medical History:  Past Medical History:  Diagnosis Date   Anxiety    Barrett esophagus    Dr. Olevia Perches in GI   Cancer [facility])    Skin Cancer   Chronic back pain    stenosis and DDD   DDD (degenerative disc disease)    s/p back surgeries and steroid injxns   Depression    takes Fluoxetine daily   Diabetes mellitus    Victoza daily   GERD (gastroesophageal reflux disease)    takes Nexium daily   History of kidney stones    Hyperlipidemia    hasn't been on Pravastatin in 6-7wks    Hypertension    takes Amlodipine and HCTZ daily   Interstitial cystitis    Joint pain    Joint swelling    Muscle spasm    takes Ativan nightly as  needed   Numbness    weakness in right leg   Pneumonia    fall 2014   PONV (postoperative nausea and vomiting)    Spinal headache    blood patch required    Urinary frequency      Past Surgical History:   Past Surgical History:  Procedure Laterality Date   adhesions removed from abdomen     APPENDECTOMY     BACK SURGERY  12/2007   x 6-fusion x 2   BIOPSY  05/19/2022   Procedure: BIOPSY;  Surgeon: Arta Silence, MD;  Location: [facility];  Service: Gastroenterology;;   CARDIAC CATHETERIZATION  2013   CESAREAN SECTION  1988/1989   CHOLECYSTECTOMY      COLONOSCOPY  2011   COLONOSCOPY WITH PROPOFOL N/A 05/19/2022   Procedure: COLONOSCOPY WITH PROPOFOL;  Surgeon: Arta Silence, MD;  Location: [facility];  Service: Gastroenterology;  Laterality: N/A;   epidural injections     KNEE ARTHROSCOPY Bilateral    LAPAROSCOPIC LYSIS OF ADHESIONS N/A 04/15/2014   Procedure: LAPAROSCOPIC LYSIS OF ADHESION;  Surgeon: Joyice Faster. Cornett, MD;  Location: [place];  Service: General;  Laterality: N/A;   LAPAROSCOPY N/A 04/15/2014   Procedure: LAPAROSCOPY DIAGNOSTIC;  Surgeon: Joyice Faster. Cornett, MD;  Location: [place];  Service: General;  Laterality: N/A;   LEFT HEART CATHETERIZATION WITH CORONARY ANGIOGRAM N/A 02/03/2012   Procedure: LEFT HEART CATHETERIZATION WITH CORONARY ANGIOGRAM;  Surgeon: Troy Sine, MD;  Location: [facility] C[facility] LAB;  Service: Cardiovascular;  Laterality: N/A;   LUMBAR FUSION Right 03/24/2015   Procedure: SI joint fusion - right ;  Surgeon: Karie Chimera, MD;  Location: [place] NEURO ORS;  Service: Neurosurgery;  Laterality: Right;  SI joint fusion - right    NECK SURGERY  12/2007   OOPHORECTOMY     TOTAL ABDOMINAL HYSTERECTOMY     TOTAL KNEE ARTHROPLASTY Right 12/25/2018   TOTAL KNEE ARTHROPLASTY Right 12/25/2018   Procedure: RIGHT TOTAL KNEE ARTHROPLASTY;  Surgeon: Meredith Pel, MD;  Location: [place];  Service: Orthopedics;  Laterality: Right;   upper gi endoscopy       Past OB/GYN History: G2 P2 Cesarean section: 2 S/p hysterectomy   Medications: She has a current medication list which includes the following prescription(s): amlodipine, aspirin, cyclobenzaprine, duloxetine, ezetimibe, fluoxetine, hydrochlorothiazide, hydrocodone-acetaminophen, insulin lispro, ozempic (1 mg/dose), pantoprazole, potassium chloride, and tresiba flextouch.   Allergies: Patient is allergic to latex, penicillins, erythromycin, ofloxacin, sulfonamide derivatives, azithromycin, fenofibrate, and statins.   Social History:  Social History   Tobacco Use    Smoking status: Former    Packs/day: 0.30    Years: 17.00    Total pack years: 5.10    Types: Cigarettes   Smokeless tobacco: Never   Tobacco comments:    quit smoking 2 1/2 yrs ago  Vaping Use   Vaping Use: Never used  Substance Use Topics   Alcohol use: Yes    Comment: rarely   Drug use: No    Relationship status: married She lives with husband.   She is not employed. Regular exercise: Yes:   History of abuse: No  Family History:   Family History  Problem Relation Age of Onset   Kidney disease Mother    Heart disease Mother        ischemic   Gallbladder disease Mother    Other Mother        rectovaginal fistula   Hypertension Father    Colon polyps Father    Allergies  Sister    Asthma Sister      Review of Systems: Review of Systems  Constitutional:  Positive for fever, malaise/fatigue and weight loss.  Respiratory:  Negative for cough, shortness of breath and wheezing.   Cardiovascular:  Negative for chest pain, palpitations and leg swelling.  Gastrointestinal:  Positive for abdominal pain. Negative for blood in stool.  Genitourinary:  Positive for dysuria.  Musculoskeletal:  Negative for myalgias.  Skin:  Negative for rash.  Neurological:  Negative for dizziness and headaches.  Endo/Heme/Allergies:  Does not bruise/bleed easily.  Psychiatric/Behavioral:  Negative for depression. The patient is not nervous/anxious.      OBJECTIVE Physical Exam: Vitals:   08/19/22 1415  BP: 133/81  Pulse: 91  Weight: 202 lb (91.6 kg)  Height: '5\' 6"'$  (1.676 m)    Physical Exam Constitutional:      General: She is not in acute distress. Pulmonary:     Effort: Pulmonary effort is normal.  Abdominal:     General: There is no distension.     Palpations: Abdomen is soft.     Tenderness: There is no abdominal tenderness. There is no rebound.  Musculoskeletal:        General: No swelling. Normal range of motion.  Skin:    General: Skin is warm and dry.      Findings: No rash.  Neurological:     Mental Status: She is alert and oriented to person, place, and time.  Psychiatric:        Mood and Affect: Mood normal.        Behavior: Behavior normal.      GU / Detailed Urogynecologic Evaluation:  Pelvic Exam: Normal external female genitalia, white discharge present at introitus, aptima swab obtained; Bartholin's and Skene's glands normal in appearance; urethral meatus normal in appearance, no obvious suburethral mass on visualization, but fullness palpated in right midurethral area and tender on palpation  CST: negative   s/p hysterectomy: Speculum exam reveals normal vaginal mucosa with  atrophy and normal vaginal cuff.  Adnexa no mass, fullness, tenderness.    Pelvic floor strength 0/V  Pelvic floor musculature: Right levator non-tender, Right obturator non-tender, Left levator non-tender, Left obturator non-tender  POP-Q:   POP-Q  -3                                            Aa   -3                                           Ba  -8                                              C   3                                            Gh  3.5  Pb  8                                            tvl   -3                                            Ap  -3                                            Bp                                                 D      Rectal Exam:  Normal external rectum  Post-Void Residual (PVR) by Bladder Scan: In order to evaluate bladder emptying, we discussed obtaining a postvoid residual and she agreed to this procedure.  Procedure: The ultrasound unit was placed on the patient's abdomen in the suprapubic region after the patient had voided. A PVR of 74 ml was obtained by bladder scan.  Laboratory Results: POC urine: trace protein   ASSESSMENT AND PLAN Ms. Hepp is a 61 y.o. with:  1. Urethral diverticulum   2. Urinary frequency   3. Vaginal irritation    4. SUI (stress urinary incontinence, female)   5. Interstitial cystitis   6. Chronic idiopathic constipation    Urethral diverticulum - Reviewed surgery for urethral diverticulectomy. Procedure is performed vaginally and will require prolonged catheter use as it requires urethral reconstruction. Reviewed risks of procedure.  - She currently has the procedure scheduled through [facility] but prefers to have it done in [place] so will cancel with them.  - Discussed importance of good blood sugar control prior to surgery as high levels can impede healing or lead to infection. Will plan to get a repeat A1c prior to surgery.   2. SUI - Has dribbling currently which is likely due to tic.  - We discussed that after diverticulectomy, she may have symptoms of SUI which would require further treatment due to urethral reconstruction.   3. Vaginal irritation - aptima swab sent  4. IC - symptoms currently stable, will address as symptoms arise.   5. Constipation - For constipation, we reviewed the importance of a better bowel regimen.   We discussed initiating therapy with increasing fluid intake, fiber supplementation, stool softeners, and laxatives such as miralax. She will add in miralax to  her regimen.    Request sent for surgical scheduling and she will return for a pre op  [name], MD

## 2022-08-19 NOTE — Patient Instructions (Signed)

## 2022-08-22 ENCOUNTER — Encounter: Payer: Self-pay | Admitting: *Deleted

## 2022-08-22 LAB — CERVICOVAGINAL ANCILLARY ONLY
Bacterial Vaginitis (gardnerella): NEGATIVE
Candida Glabrata: NEGATIVE
Candida Vaginitis: NEGATIVE
Comment: NEGATIVE
Comment: NEGATIVE
Comment: NEGATIVE

## 2022-09-28 ENCOUNTER — Ambulatory Visit (INDEPENDENT_AMBULATORY_CARE_PROVIDER_SITE_OTHER): Payer: Medicare Other | Admitting: Obstetrics and Gynecology

## 2022-09-28 ENCOUNTER — Encounter: Payer: Self-pay | Admitting: Obstetrics and Gynecology

## 2022-09-28 VITALS — BP 130/84 | HR 80 | Wt 197.0 lb

## 2022-09-28 DIAGNOSIS — R350 Frequency of micturition: Secondary | ICD-10-CM | POA: Diagnosis not present

## 2022-09-28 DIAGNOSIS — Z01818 Encounter for other preprocedural examination: Secondary | ICD-10-CM

## 2022-09-28 LAB — POCT URINALYSIS DIPSTICK
Bilirubin, UA: NEGATIVE
Blood, UA: NEGATIVE
Glucose, UA: NEGATIVE
Leukocytes, UA: NEGATIVE
Nitrite, UA: NEGATIVE
Protein, UA: POSITIVE — AB
Spec Grav, UA: >=1.03 — AB (ref 1.010–1.025)
Urobilinogen, UA: 2 E.U./dL — AB
pH, UA: 6 (ref 5.0–8.0)

## 2022-09-28 LAB — HEMOGLOBIN A1C
Est. average glucose Bld gHb Est-mCnc: 146 mg/dL
Hgb A1c MFr Bld: 6.7 % — ABNORMAL HIGH (ref 4.8–5.6)

## 2022-09-28 MED ORDER — IBUPROFEN 600 MG PO TABS: 600.0000 mg | ORAL_TABLET | Freq: Four times a day (QID) | ORAL | 0 refills | Status: DC | PRN

## 2022-09-28 MED ORDER — ACETAMINOPHEN 500 MG PO TABS: 500.0000 mg | ORAL_TABLET | Freq: Four times a day (QID) | ORAL | 0 refills | Status: AC | PRN

## 2022-09-28 MED ORDER — POLYETHYLENE GLYCOL 3350 17 GM/SCOOP PO POWD: 17.0000 g | Freq: Every day | ORAL | 0 refills | Status: DC

## 2022-09-28 MED ORDER — OXYCODONE HCL 5 MG PO TABS: 5.0000 mg | ORAL_TABLET | ORAL | 0 refills | Status: DC | PRN

## 2022-09-28 NOTE — Progress Notes (Signed)
[facility] Pre-Operative visit  Subjective Chief Complaint: [name] presents for a preoperative encounter.   History of Present Illness: [name] is a 61 y.o. female who presents for preoperative visit.  She is scheduled to undergo Exam under anesthesia, urethral diverticulectomy, cystoscopy on 10/27/22.    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 Some associated peripheral contrast enhancement, suggesting infection or inflammation.  Past Medical History:  Diagnosis Date   Anxiety    Barrett esophagus    Dr. Olevia Perches in GI   Cancer [facility])    Skin Cancer   Chronic back pain    stenosis and DDD   DDD (degenerative disc disease)    s/p back surgeries and steroid injxns   Depression    takes Fluoxetine daily   Diabetes mellitus    Victoza daily   GERD (gastroesophageal reflux disease)    takes Nexium daily   History of kidney stones    Hyperlipidemia    hasn't been on Pravastatin in 6-7wks    Hypertension    takes Amlodipine and HCTZ daily   Interstitial cystitis    Joint pain    Joint swelling    Muscle spasm    takes Ativan nightly as needed   Numbness    weakness in right leg   Pneumonia    fall 2014   PONV (postoperative nausea and vomiting)    Spinal headache    blood patch required    Urinary frequency      Past Surgical History:  Procedure Laterality Date   adhesions removed from abdomen     APPENDECTOMY     BACK SURGERY  12/2007   x 6-fusion x 2   BIOPSY  05/19/2022   Procedure: BIOPSY;  Surgeon: Arta Silence, MD;  Location: [facility];  Service: Gastroenterology;;   CARDIAC CATHETERIZATION  2013   CESAREAN SECTION  1988/1989   CHOLECYSTECTOMY     COLONOSCOPY  2011   COLONOSCOPY WITH PROPOFOL N/A 05/19/2022   Procedure: COLONOSCOPY WITH PROPOFOL;  Surgeon: Arta Silence, MD;  [facility]: [facility];  Service: Gastroenterology;  Laterality: N/A;   epidural injections      KNEE ARTHROSCOPY Bilateral    LAPAROSCOPIC LYSIS OF ADHESIONS N/A 04/15/2014   Procedure: LAPAROSCOPIC LYSIS OF ADHESION;  Surgeon: Joyice Faster. Cornett, MD;  Location: [place];  Service: General;  Laterality: N/A;   LAPAROSCOPY N/A 04/15/2014   Procedure: LAPAROSCOPY DIAGNOSTIC;  Surgeon: Joyice Faster. Cornett, MD;  Location: [place];  Service: General;  Laterality: N/A;   LEFT HEART CATHETERIZATION WITH CORONARY ANGIOGRAM N/A 02/03/2012   Procedure: LEFT HEART CATHETERIZATION WITH CORONARY ANGIOGRAM;  Surgeon: Troy Sine, MD;  Location: [facility] CATH LAB;  Service: Cardiovascular;  Laterality: N/A;   LUMBAR FUSION Right 03/24/2015   Procedure: SI joint fusion - right ;  Surgeon: Karie Chimera, MD;  Location: [place] [facility];  Service: Neurosurgery;  Laterality: Right;  SI joint fusion - right    NECK SURGERY  12/2007   OOPHORECTOMY     TOTAL ABDOMINAL HYSTERECTOMY     TOTAL KNEE ARTHROPLASTY Right 12/25/2018   TOTAL KNEE ARTHROPLASTY Right 12/25/2018   Procedure: RIGHT TOTAL KNEE ARTHROPLASTY;  Surgeon: Meredith Pel, MD;  Location: [place];  Service: Orthopedics;  Laterality: Right;   upper gi endoscopy      is allergic to latex, penicillins, erythromycin, ofloxacin, sulfonamide derivatives, azithromycin, fenofibrate, and statins.   Family History  Problem Relation  Age of Onset   Kidney disease Mother    Heart disease Mother        ischemic   Gallbladder disease Mother    Other Mother        rectovaginal fistula   Hypertension Father    Colon polyps Father    Allergies Sister    Asthma Sister     Social History   Tobacco Use   Smoking status: Former    Packs/day: 0.30    Years: 17.00    Total pack years: 5.10    Types: Cigarettes   Smokeless tobacco: Never   Tobacco comments:    quit smoking 2 1/2 yrs ago  Vaping Use   Vaping Use: Never used  Substance Use Topics   Alcohol use: Yes    Comment: rarely   Drug use: No     Review of Systems was negative for a full 10 system  review except as noted in the History of Present Illness.   Current Outpatient Medications:    amLODipine (NORVASC) 5 MG tablet, Take 5 mg by mouth daily., Disp: , Rfl:    aspirin 81 MG chewable tablet, Chew 1 tablet (81 mg total) by mouth 2 (two) times daily. Hold asa if you have active bleeding, Disp: 42 tablet, Rfl: 0   cyclobenzaprine (FLEXERIL) 10 MG tablet, Take 1 tablet (10 mg total) by mouth 3 (three) times daily as needed for muscle spasms., Disp: 30 tablet, Rfl: 0   DULoxetine (CYMBALTA) 20 MG capsule, Take 20 mg by mouth daily., Disp: , Rfl:    ezetimibe (ZETIA) 10 MG tablet, Take 10 mg by mouth daily., Disp: , Rfl:    FLUoxetine (PROZAC) 40 MG capsule, Take 1 capsule (40 mg total) by mouth daily. NEEDS APPT FOR REFILLS (Patient taking differently: Take 40 mg by mouth daily.), Disp: 90 capsule, Rfl: 0   hydrochlorothiazide (HYDRODIURIL) 25 MG tablet, Take 1 tablet (25 mg total) by mouth daily. NEEDS APPT FOR REFILLS (Patient taking differently: Take 25 mg by mouth daily.), Disp: 90 tablet, Rfl: 0   HYDROcodone-acetaminophen (NORCO/VICODIN) 5-325 MG tablet, 1 po q 4-6hrs prn pain (Patient taking differently: Take 1 tablet by mouth every 4 (four) hours as needed for moderate pain.), Disp: 45 tablet, Rfl: 0   insulin lispro (HUMALOG) 100 UNIT/ML injection, Inject 2 Units into the skin See admin instructions. Inject 2 units SQ up to 6 times daily as needed for BS > 250, Disp: , Rfl:    OZEMPIC, 1 MG/DOSE, 4 MG/3ML SOPN, Inject 1 mg into the skin once a week., Disp: , Rfl:    pantoprazole (PROTONIX) 40 MG tablet, Take 40 mg by mouth 2 (two) times daily., Disp: , Rfl:    potassium chloride (KLOR-CON) 10 MEQ tablet, Take 10 mEq by mouth 2 (two) times daily., Disp: , Rfl:    TRESIBA FLEXTOUCH 100 UNIT/ML SOPN FlexTouch Pen, Inject 50 Units into the skin daily. , Disp: , Rfl: 5   Objective Vitals:   09/28/22 1131  BP: 130/84  Pulse: 80    Gen: NAD CV: S1 S2 RRR Lungs: Clear to  auscultation bilaterally Abd: soft, nontender  Previous Pelvic Exam showed: urethral meatus normal in appearance, no obvious suburethral mass on visualization, but fullness palpated in right midurethral area and tender on palpation    Assessment/ Plan  Assessment: The patient is a 61 y.o. year old scheduled to undergo Exam under anesthesia, urethral diverticulectomy, cystoscopy. Verbal consent was obtained for these procedures.  Plan:  General Surgical Consent: The patient has previously been counseled on alternative treatments, and the decision by the patient and provider was to proceed with the procedure listed above.  For all procedures, there are risks of bleeding, infection, damage to surrounding organs including but not limited to bowel, bladder, blood vessels, ureters and nerves, and need for further surgery if an injury were to occur. These risks are all low with minimally invasive surgery.   There are risks of numbness and weakness at any body site or buttock/rectal pain.  It is possible that baseline pain can be worsened by surgery, either with or without mesh. If surgery is vaginal, there is also a low risk of possible conversion to laparoscopy or open abdominal incision where indicated. Very rare risks include blood transfusion, blood clot, heart attack, pneumonia, or death.   There is also a risk of short-term postoperative urinary retention with need to use a catheter. About half of patients need to go home from surgery with a catheter, which is then later removed in the office. The risk of long-term need for a catheter is very low. There is also a risk of worsening of overactive bladder.   We discussed consent for blood products. Risks for blood transfusion include allergic reactions, other reactions that can affect different body organs and managed accordingly, transmission of infectious diseases such as HIV or Hepatitis. However, the blood is screened. Patient consents for blood  products.  Pre-operative instructions:  She was instructed to not take Aspirin/NSAIDs x 7days prior to surgery.  Antibiotic prophylaxis was ordered as indicated.  Catheter use: She will wear a catheter for two weeks post operatively   Post-operative instructions:  She was provided with specific post-operative instructions, including precautions and signs/symptoms for which we would recommend contacting us, in addition to daytime and after-hours contact phone numbers. This was provided on a handout.   Post-operative medications: Prescriptions for motrin, tylenol, miralax, and oxycodone were sent to her pharmacy. Discussed using ibuprofen and tylenol on a schedule to limit use of narcotics.   Laboratory testing:  Will check repeat A1c  Preoperative clearance:  She does not require surgical clearance.    Post-operative follow-up:  A post-operative appointment will be made for 6 weeks from the date of surgery. If she needs a post-operative nurse visit for a voiding trial, that will be set up after she leaves the hospital.    Patient will call the clinic or use MyChart should anything change or any new issues arise.   [name], MD  Time spent: I spent 20 minutes dedicated to the care of this patient on the date of this encounter to include pre-visit review of records, face-to-face time with the patient  and post visit documentation and ordering medication/ testing.

## 2022-10-03 ENCOUNTER — Encounter: Payer: Self-pay | Admitting: Obstetrics and Gynecology

## 2022-10-03 NOTE — H&P (Signed)
[facility] Pre-Operative H&P  Subjective Chief Complaint: [name] presents for a preoperative encounter.   History of Present Illness: [name] is a 61 y.o. female who presents for preoperative visit.  She is scheduled to undergo Exam under anesthesia, urethral diverticulectomy, cystoscopy on 10/27/22.    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 Some associated peripheral contrast enhancement, suggesting infection or inflammation.  Past Medical History:  Diagnosis Date   Anxiety    Barrett esophagus    Dr. Olevia Perches in GI   Cancer [facility])    Skin Cancer   Chronic back pain    stenosis and DDD   DDD (degenerative disc disease)    s/p back surgeries and steroid injxns   Depression    takes Fluoxetine daily   Diabetes mellitus    Victoza daily   GERD (gastroesophageal reflux disease)    takes Nexium daily   History of kidney stones    Hyperlipidemia    hasn't been on Pravastatin in 6-7wks    Hypertension    takes Amlodipine and HCTZ daily   Interstitial cystitis    Joint pain    Joint swelling    Muscle spasm    takes Ativan nightly as needed   Numbness    weakness in right leg   Pneumonia    fall 2014   PONV (postoperative nausea and vomiting)    Spinal headache    blood patch required    Urinary frequency      Past Surgical History:  Procedure Laterality Date   adhesions removed from abdomen     APPENDECTOMY     BACK SURGERY  12/2007   x 6-fusion x 2   BIOPSY  05/19/2022   Procedure: BIOPSY;  Surgeon: Arta Silence, MD;  [facility];  Service: Gastroenterology;;   CARDIAC CATHETERIZATION  2013   CESAREAN SECTION  1988/1989   CHOLECYSTECTOMY     COLONOSCOPY  2011   COLONOSCOPY WITH PROPOFOL N/A 05/19/2022   Procedure: COLONOSCOPY WITH PROPOFOL;  Surgeon: Arta Silence, MD;  [facility];  Service: Gastroenterology;  Laterality: N/A;   epidural injections      KNEE ARTHROSCOPY Bilateral    LAPAROSCOPIC LYSIS OF ADHESIONS N/A 04/15/2014   Procedure: LAPAROSCOPIC LYSIS OF ADHESION;  Surgeon: Joyice Faster. Cornett, MD;  Location: [place];  Service: General;  Laterality: N/A;   LAPAROSCOPY N/A 04/15/2014   Procedure: LAPAROSCOPY DIAGNOSTIC;  Surgeon: Joyice Faster. Cornett, MD;  Location: [place];  Service: General;  Laterality: N/A;   LEFT HEART CATHETERIZATION WITH CORONARY ANGIOGRAM N/A 02/03/2012   Procedure: LEFT HEART CATHETERIZATION WITH CORONARY ANGIOGRAM;  Surgeon: Troy Sine, MD;  [facility] CATH LAB;  Service: Cardiovascular;  Laterality: N/A;   LUMBAR FUSION Right 03/24/2015   Procedure: SI joint fusion - right ;  Surgeon: Karie Chimera, MD;  Location: [place] [facility];  Service: Neurosurgery;  Laterality: Right;  SI joint fusion - right    NECK SURGERY  12/2007   OOPHORECTOMY     TOTAL ABDOMINAL HYSTERECTOMY     TOTAL KNEE ARTHROPLASTY Right 12/25/2018   TOTAL KNEE ARTHROPLASTY Right 12/25/2018   Procedure: RIGHT TOTAL KNEE ARTHROPLASTY;  Surgeon: Meredith Pel, MD;  Location: [place];  Service: Orthopedics;  Laterality: Right;   upper gi endoscopy      is allergic to latex, penicillins, erythromycin, ofloxacin, sulfonamide derivatives, azithromycin, fenofibrate, and statins.   Family History  Problem Relation  Age of Onset   Kidney disease Mother    Heart disease Mother        ischemic   Gallbladder disease Mother    Other Mother        rectovaginal fistula   Hypertension Father    Colon polyps Father    Allergies Sister    Asthma Sister     Social History   Tobacco Use   Smoking status: Former    Packs/day: 0.30    Years: 17.00    Total pack years: 5.10    Types: Cigarettes   Smokeless tobacco: Never   Tobacco comments:    quit smoking 2 1/2 yrs ago  Vaping Use   Vaping Use: Never used  Substance Use Topics   Alcohol use: Yes    Comment: rarely   Drug use: No     Review of Systems was negative for a full 10 system review  except as noted in the History of Present Illness.  No current facility-administered medications for this encounter.  Current Outpatient Medications:    acetaminophen (TYLENOL) 500 MG tablet, Take 1 tablet (500 mg total) by mouth every 6 (six) hours as needed (pain)., Disp: 30 tablet, Rfl: 0   amLODipine (NORVASC) 5 MG tablet, Take 5 mg by mouth daily., Disp: , Rfl:    aspirin 81 MG chewable tablet, Chew 1 tablet (81 mg total) by mouth 2 (two) times daily. Hold asa if you have active bleeding, Disp: 42 tablet, Rfl: 0   cyclobenzaprine (FLEXERIL) 10 MG tablet, Take 1 tablet (10 mg total) by mouth 3 (three) times daily as needed for muscle spasms., Disp: 30 tablet, Rfl: 0   DULoxetine (CYMBALTA) 20 MG capsule, Take 20 mg by mouth daily., Disp: , Rfl:    ezetimibe (ZETIA) 10 MG tablet, Take 10 mg by mouth daily., Disp: , Rfl:    FLUoxetine (PROZAC) 40 MG capsule, Take 1 capsule (40 mg total) by mouth daily. NEEDS APPT FOR REFILLS (Patient taking differently: Take 40 mg by mouth daily.), Disp: 90 capsule, Rfl: 0   hydrochlorothiazide (HYDRODIURIL) 25 MG tablet, Take 1 tablet (25 mg total) by mouth daily. NEEDS APPT FOR REFILLS (Patient taking differently: Take 25 mg by mouth daily.), Disp: 90 tablet, Rfl: 0   HYDROcodone-acetaminophen (NORCO/VICODIN) 5-325 MG tablet, 1 po q 4-6hrs prn pain (Patient taking differently: Take 1 tablet by mouth every 4 (four) hours as needed for moderate pain.), Disp: 45 tablet, Rfl: 0   ibuprofen (ADVIL) 600 MG tablet, Take 1 tablet (600 mg total) by mouth every 6 (six) hours as needed., Disp: 30 tablet, Rfl: 0   insulin lispro (HUMALOG) 100 UNIT/ML injection, Inject 2 Units into the skin See admin instructions. Inject 2 units SQ up to 6 times daily as needed for BS > 250, Disp: , Rfl:    oxyCODONE (OXY IR/ROXICODONE) 5 MG immediate release tablet, Take 1 tablet (5 mg total) by mouth every 4 (four) hours as needed for severe pain., Disp: 5 tablet, Rfl: 0   OZEMPIC, 1  MG/DOSE, 4 MG/3ML SOPN, Inject 1 mg into the skin once a week., Disp: , Rfl:    pantoprazole (PROTONIX) 40 MG tablet, Take 40 mg by mouth 2 (two) times daily., Disp: , Rfl:    polyethylene glycol powder (GLYCOLAX/MIRALAX) 17 GM/SCOOP powder, Take 17 g by mouth daily. Drink 17g (1 scoop) dissolved in water per day., Disp: 255 g, Rfl: 0   potassium chloride (KLOR-CON) 10 MEQ tablet, Take 10 mEq by  mouth 2 (two) times daily., Disp: , Rfl:    TRESIBA FLEXTOUCH 100 UNIT/ML SOPN FlexTouch Pen, Inject 50 Units into the skin daily. , Disp: , Rfl: 5   Objective There were no vitals filed for this visit.   Gen: NAD CV: S1 S2 RRR Lungs: Clear to auscultation bilaterally Abd: soft, nontender  Previous Pelvic Exam showed: urethral meatus normal in appearance, no obvious suburethral mass on visualization, but fullness palpated in right midurethral area and tender on palpation    Lab Results  Component Value Date   HGBA1C 6.7 (H) 09/28/2022     Assessment/ Plan  The patient is a 61 y.o. year old scheduled to undergo Exam under anesthesia, urethral diverticulectomy, cystoscopy.   [name], MD

## 2022-10-20 ENCOUNTER — Encounter (HOSPITAL_BASED_OUTPATIENT_CLINIC_OR_DEPARTMENT_OTHER): Payer: Self-pay | Admitting: Obstetrics and Gynecology

## 2022-10-20 NOTE — Progress Notes (Addendum)
Spoke w/ via phone for pre-op interview--- Wood----  ISTAT and EKG             Lab results------ COVID test -----patient states asymptomatic no test needed Arrive at -------1030 NPO after MN NO Solid Food.  Clear liquids from MN until---0930 Med rec completed Medications to take morning of surgery ----- Norvasc, Cymbalta, Prozac, Protonix. Diabetic medication ----- NONE AM of surgery. 1/2 insulin dose night before surgery. Patient instructed no nail polish to be worn day of surgery Patient instructed to bring photo id and insurance card day of surgery Patient aware to have Driver (ride ) / caregiver Husband [name]   for 24 hours after surgery  Patient Special Instructions ----- Pre-Op special Istructions ----- Patient takes Ozempic normal dose day Tuesday. Patient verbalized understanding that she needs to hold Ozempic dose on Tuesday 10/25/22 prior to surgery. Patient verbalized understanding of instructions that were given at this phone interview. Patient denies shortness of breath, chest pain, fever, cough at this phone interview.  *Per Dr Tommas Olp note patient to hold ASA and NSAIDS for 7 days prior to surgery, patient verbalized understanding.

## 2022-10-27 ENCOUNTER — Other Ambulatory Visit: Payer: Self-pay

## 2022-10-27 ENCOUNTER — Ambulatory Visit (HOSPITAL_BASED_OUTPATIENT_CLINIC_OR_DEPARTMENT_OTHER): Payer: Medicare Other | Admitting: Anesthesiology

## 2022-10-27 ENCOUNTER — Encounter (HOSPITAL_BASED_OUTPATIENT_CLINIC_OR_DEPARTMENT_OTHER)
Admission: RE | Disposition: A | Discharge status: Home / Self Care | Payer: Self-pay | Source: Home / Self Care | Attending: Obstetrics and Gynecology

## 2022-10-27 ENCOUNTER — Encounter (HOSPITAL_BASED_OUTPATIENT_CLINIC_OR_DEPARTMENT_OTHER): Payer: Self-pay | Admitting: Obstetrics and Gynecology

## 2022-10-27 ENCOUNTER — Ambulatory Visit (HOSPITAL_BASED_OUTPATIENT_CLINIC_OR_DEPARTMENT_OTHER)
Admission: RE | Admit: 2022-10-27 | Discharge: 2022-10-27 | Disposition: A | Discharge status: Home / Self Care | Payer: Medicare Other | Attending: Obstetrics and Gynecology | Admitting: Obstetrics and Gynecology

## 2022-10-27 DIAGNOSIS — Z6831 Body mass index (BMI) 31.0-31.9, adult: Secondary | ICD-10-CM | POA: Diagnosis not present

## 2022-10-27 DIAGNOSIS — F32A Depression, unspecified: Secondary | ICD-10-CM | POA: Insufficient documentation

## 2022-10-27 DIAGNOSIS — R519 Headache, unspecified: Secondary | ICD-10-CM | POA: Insufficient documentation

## 2022-10-27 DIAGNOSIS — Z87442 Personal history of urinary calculi: Secondary | ICD-10-CM | POA: Insufficient documentation

## 2022-10-27 DIAGNOSIS — M1990 Unspecified osteoarthritis, unspecified site: Secondary | ICD-10-CM | POA: Diagnosis not present

## 2022-10-27 DIAGNOSIS — F419 Anxiety disorder, unspecified: Secondary | ICD-10-CM | POA: Diagnosis not present

## 2022-10-27 DIAGNOSIS — N3289 Other specified disorders of bladder: Secondary | ICD-10-CM

## 2022-10-27 DIAGNOSIS — K219 Gastro-esophageal reflux disease without esophagitis: Secondary | ICD-10-CM | POA: Insufficient documentation

## 2022-10-27 DIAGNOSIS — Z87891 Personal history of nicotine dependence: Secondary | ICD-10-CM

## 2022-10-27 DIAGNOSIS — Z794 Long term (current) use of insulin: Secondary | ICD-10-CM | POA: Insufficient documentation

## 2022-10-27 DIAGNOSIS — F418 Other specified anxiety disorders: Secondary | ICD-10-CM

## 2022-10-27 DIAGNOSIS — E119 Type 2 diabetes mellitus without complications: Secondary | ICD-10-CM | POA: Insufficient documentation

## 2022-10-27 DIAGNOSIS — N361 Urethral diverticulum: Secondary | ICD-10-CM | POA: Diagnosis not present

## 2022-10-27 DIAGNOSIS — I10 Essential (primary) hypertension: Secondary | ICD-10-CM

## 2022-10-27 DIAGNOSIS — N9971 Accidental puncture and laceration of a genitourinary system organ or structure during a genitourinary system procedure: Secondary | ICD-10-CM | POA: Insufficient documentation

## 2022-10-27 HISTORY — PX: CYSTOSCOPY: SHX5120

## 2022-10-27 HISTORY — PX: URETHRAL CYST REMOVAL: SHX5128

## 2022-10-27 LAB — POCT I-STAT, CHEM 8
BUN: 13 mg/dL (ref 6–20)
Calcium, Ion: 1.19 mmol/L (ref 1.15–1.40)
Chloride: 98 mmol/L (ref 98–111)
Creatinine, Ser: 0.8 mg/dL (ref 0.44–1.00)
Glucose, Bld: 132 mg/dL — ABNORMAL HIGH (ref 70–99)
HCT: 38 % (ref 36.0–46.0)
Hemoglobin: 12.9 g/dL (ref 12.0–15.0)
Potassium: 3.1 mmol/L — ABNORMAL LOW (ref 3.5–5.1)
Sodium: 138 mmol/L (ref 135–145)
TCO2: 28 mmol/L (ref 22–32)

## 2022-10-27 LAB — GLUCOSE, CAPILLARY: Glucose-Capillary: 204 mg/dL — ABNORMAL HIGH (ref 70–99)

## 2022-10-27 SURGERY — EXCISION, CYST, PERIURETHRAL
Anesthesia: General | Site: Urethra

## 2022-10-27 MED ORDER — DEXTROSE 5 % IV SOLN
5.0000 mg/kg | INTRAVENOUS | Status: AC
  Administered 2022-10-27: 440 mg via INTRAVENOUS
  Filled 2022-10-27: qty 11

## 2022-10-27 MED ORDER — METRONIDAZOLE 500 MG/100ML IV SOLN
INTRAVENOUS | Status: AC
  Filled 2022-10-27: qty 100

## 2022-10-27 MED ORDER — ACETAMINOPHEN 500 MG PO TABS
1000.0000 mg | ORAL_TABLET | ORAL | Status: AC
  Administered 2022-10-27: 1000 mg via ORAL

## 2022-10-27 MED ORDER — KETOROLAC TROMETHAMINE 30 MG/ML IJ SOLN
INTRAMUSCULAR | Status: AC
  Filled 2022-10-27: qty 1

## 2022-10-27 MED ORDER — PROPOFOL 1000 MG/100ML IV EMUL
INTRAVENOUS | Status: AC
  Filled 2022-10-27: qty 100

## 2022-10-27 MED ORDER — MEPERIDINE HCL 25 MG/ML IJ SOLN: 6.2500 mg | INTRAMUSCULAR | Status: DC | PRN

## 2022-10-27 MED ORDER — LIDOCAINE 2% (20 MG/ML) 5 ML SYRINGE
INTRAMUSCULAR | Status: DC | PRN
  Administered 2022-10-27: 50 mg via INTRAVENOUS

## 2022-10-27 MED ORDER — DEXAMETHASONE SODIUM PHOSPHATE 10 MG/ML IJ SOLN
INTRAMUSCULAR | Status: AC
  Filled 2022-10-27: qty 1

## 2022-10-27 MED ORDER — ONDANSETRON HCL 4 MG/2ML IJ SOLN
INTRAMUSCULAR | Status: DC | PRN
  Administered 2022-10-27: 4 mg via INTRAVENOUS

## 2022-10-27 MED ORDER — METRONIDAZOLE 500 MG/100ML IV SOLN
500.0000 mg | INTRAVENOUS | Status: AC
  Administered 2022-10-27: 500 mg via INTRAVENOUS

## 2022-10-27 MED ORDER — ROCURONIUM 10MG/ML (10ML) SYRINGE FOR MEDFUSION PUMP - OPTIME
INTRAVENOUS | Status: DC | PRN
  Administered 2022-10-27: 40 mg via INTRAVENOUS

## 2022-10-27 MED ORDER — LIDOCAINE HCL (PF) 2 % IJ SOLN
INTRAMUSCULAR | Status: AC
  Filled 2022-10-27: qty 5

## 2022-10-27 MED ORDER — OXYCODONE HCL 5 MG PO TABS: 5.0000 mg | ORAL_TABLET | Freq: Once | ORAL | Status: DC | PRN

## 2022-10-27 MED ORDER — PROPOFOL 10 MG/ML IV BOLUS
INTRAVENOUS | Status: DC | PRN
  Administered 2022-10-27: 150 mg via INTRAVENOUS

## 2022-10-27 MED ORDER — PHENAZOPYRIDINE HCL 100 MG PO TABS
ORAL_TABLET | ORAL | Status: AC
  Filled 2022-10-27: qty 2

## 2022-10-27 MED ORDER — ACETAMINOPHEN 160 MG/5ML PO SOLN: 325.0000 mg | ORAL | Status: DC | PRN

## 2022-10-27 MED ORDER — FENTANYL CITRATE (PF) 100 MCG/2ML IJ SOLN
INTRAMUSCULAR | Status: AC
  Filled 2022-10-27: qty 2

## 2022-10-27 MED ORDER — LIDOCAINE-EPINEPHRINE 1 %-1:100000 IJ SOLN
INTRAMUSCULAR | Status: DC | PRN
  Administered 2022-10-27: 7 mL

## 2022-10-27 MED ORDER — OXYCODONE HCL 5 MG/5ML PO SOLN: 5.0000 mg | Freq: Once | ORAL | Status: DC | PRN

## 2022-10-27 MED ORDER — PROPOFOL 500 MG/50ML IV EMUL
INTRAVENOUS | Status: DC | PRN
  Administered 2022-10-27: 200 ug/kg/min via INTRAVENOUS

## 2022-10-27 MED ORDER — PROPOFOL 10 MG/ML IV BOLUS
INTRAVENOUS | Status: AC
  Filled 2022-10-27: qty 20

## 2022-10-27 MED ORDER — 0.9 % SODIUM CHLORIDE (POUR BTL) OPTIME
TOPICAL | Status: DC | PRN
  Administered 2022-10-27: 500 mL

## 2022-10-27 MED ORDER — SUGAMMADEX SODIUM 200 MG/2ML IV SOLN
INTRAVENOUS | Status: DC | PRN
  Administered 2022-10-27: 200 mg via INTRAVENOUS

## 2022-10-27 MED ORDER — ACETAMINOPHEN 500 MG PO TABS
ORAL_TABLET | ORAL | Status: AC
  Filled 2022-10-27: qty 2

## 2022-10-27 MED ORDER — ONDANSETRON HCL 4 MG/2ML IJ SOLN
INTRAMUSCULAR | Status: AC
  Filled 2022-10-27: qty 2

## 2022-10-27 MED ORDER — DIPHENHYDRAMINE HCL 50 MG/ML IJ SOLN
INTRAMUSCULAR | Status: AC
  Filled 2022-10-27: qty 1

## 2022-10-27 MED ORDER — FENTANYL CITRATE (PF) 100 MCG/2ML IJ SOLN
25.0000 ug | INTRAMUSCULAR | Status: DC | PRN
  Administered 2022-10-27 (×2): 25 ug via INTRAVENOUS

## 2022-10-27 MED ORDER — POVIDONE-IODINE 10 % EX SWAB: 2.0000 | Freq: Once | CUTANEOUS | Status: DC

## 2022-10-27 MED ORDER — DIPHENHYDRAMINE HCL 50 MG/ML IJ SOLN
INTRAMUSCULAR | Status: DC | PRN
  Administered 2022-10-27: 12.5 mg via INTRAVENOUS

## 2022-10-27 MED ORDER — MIDAZOLAM HCL 2 MG/2ML IJ SOLN
INTRAMUSCULAR | Status: DC | PRN
  Administered 2022-10-27: 2 mg via INTRAVENOUS

## 2022-10-27 MED ORDER — LACTATED RINGERS IV SOLN: INTRAVENOUS | Status: DC

## 2022-10-27 MED ORDER — SODIUM CHLORIDE 0.9 % IR SOLN
Status: DC | PRN
  Administered 2022-10-27 (×4): 1000 mL via INTRAVESICAL

## 2022-10-27 MED ORDER — ROCURONIUM BROMIDE 10 MG/ML (PF) SYRINGE
PREFILLED_SYRINGE | INTRAVENOUS | Status: AC
  Filled 2022-10-27: qty 10

## 2022-10-27 MED ORDER — MIDAZOLAM HCL 2 MG/2ML IJ SOLN
INTRAMUSCULAR | Status: AC
  Filled 2022-10-27: qty 2

## 2022-10-27 MED ORDER — KETOROLAC TROMETHAMINE 30 MG/ML IJ SOLN
INTRAMUSCULAR | Status: DC | PRN
  Administered 2022-10-27: 30 mg via INTRAVENOUS

## 2022-10-27 MED ORDER — ACETAMINOPHEN 325 MG PO TABS: 325.0000 mg | ORAL_TABLET | ORAL | Status: DC | PRN

## 2022-10-27 MED ORDER — PROPOFOL 1000 MG/100ML IV EMUL
INTRAVENOUS | Status: AC
  Filled 2022-10-27: qty 200

## 2022-10-27 MED ORDER — DEXAMETHASONE SODIUM PHOSPHATE 10 MG/ML IJ SOLN
INTRAMUSCULAR | Status: DC | PRN
  Administered 2022-10-27: 10 mg via INTRAVENOUS

## 2022-10-27 MED ORDER — ONDANSETRON HCL 4 MG/2ML IJ SOLN: 4.0000 mg | Freq: Once | INTRAMUSCULAR | Status: DC | PRN

## 2022-10-27 MED ORDER — PHENAZOPYRIDINE HCL 100 MG PO TABS
200.0000 mg | ORAL_TABLET | ORAL | Status: AC
  Administered 2022-10-27: 200 mg via ORAL

## 2022-10-27 MED ORDER — PHENYLEPHRINE 80 MCG/ML (10ML) SYRINGE FOR IV PUSH (FOR BLOOD PRESSURE SUPPORT)
PREFILLED_SYRINGE | INTRAVENOUS | Status: DC | PRN
  Administered 2022-10-27 (×12): 80 ug via INTRAVENOUS

## 2022-10-27 MED ORDER — FENTANYL CITRATE (PF) 250 MCG/5ML IJ SOLN
INTRAMUSCULAR | Status: DC | PRN
  Administered 2022-10-27: 25 ug via INTRAVENOUS
  Administered 2022-10-27: 50 ug via INTRAVENOUS
  Administered 2022-10-27 (×3): 25 ug via INTRAVENOUS
  Administered 2022-10-27: 50 ug via INTRAVENOUS

## 2022-10-27 SURGICAL SUPPLY — 39 items
AGENT HMST KT MTR STRL THRMB (HEMOSTASIS)
AGENT HMST MTR 8 SURGIFLO (HEMOSTASIS)
BLADE CLIPPER SENSICLIP SURGIC (BLADE) ×2 IMPLANT
BLADE SURG 15 STRL LF DISP TIS (BLADE) ×2 IMPLANT
BLADE SURG 15 STRL SS (BLADE) ×2
EVACUATOR MICROVAS BLADDER (UROLOGICAL SUPPLIES) IMPLANT
GAUZE 4X4 16PLY ~~LOC~~+RFID DBL (SPONGE) IMPLANT
GLOVE BIOGEL PI IND STRL 7.0 (GLOVE) IMPLANT
GLOVE SURG SS PI 6.0 STRL IVOR (GLOVE) IMPLANT
GOWN STRL REUS W/TWL LRG LVL3 (GOWN DISPOSABLE) ×2 IMPLANT
GOWN STRL REUS W/TWL XL LVL3 (GOWN DISPOSABLE) IMPLANT
HIBICLENS CHG 4% 4OZ BTL (MISCELLANEOUS) ×2 IMPLANT
HOLDER FOLEY CATH W/STRAP (MISCELLANEOUS) ×2 IMPLANT
KIT TURNOVER CYSTO (KITS) ×2 IMPLANT
MANIFOLD NEPTUNE II (INSTRUMENTS) IMPLANT
NEEDLE HYPO 22GX1.5 SAFETY (NEEDLE) ×2 IMPLANT
NS IRRIG 500ML POUR BTL (IV SOLUTION) IMPLANT
PACK PERINEAL COLD (PAD) IMPLANT
PACK VAGINAL WOMENS (CUSTOM PROCEDURE TRAY) ×2 IMPLANT
RETRACTOR LONE STAR DISPOSABLE (INSTRUMENTS) ×2 IMPLANT
RETRACTOR STAY HOOK 5MM (MISCELLANEOUS) ×2 IMPLANT
SET IRRIG Y TYPE TUR BLADDER L (SET/KITS/TRAYS/PACK) IMPLANT
SPIKE FLUID TRANSFER (MISCELLANEOUS) IMPLANT
SPOGE SURGIFLO 8M (HEMOSTASIS)
SPONGE SURGIFLO 8M (HEMOSTASIS) IMPLANT
SURGIFLO W/THROMBIN 8M KIT (HEMOSTASIS) IMPLANT
SUT VIC AB 2-0 SH 18 (SUTURE) ×2 IMPLANT
SUT VIC AB 2-0 SH 27 (SUTURE) ×2
SUT VIC AB 2-0 SH 27XBRD (SUTURE) IMPLANT
SUT VIC AB 3-0 SH 27 (SUTURE) ×4
SUT VIC AB 3-0 SH 27X BRD (SUTURE) IMPLANT
SUT VIC AB 4-0 PS2 18 (SUTURE) IMPLANT
SUT VIC AB 4-0 PS2 27 (SUTURE) IMPLANT
SUT VICRYL 2-0 SH 8X27 (SUTURE) IMPLANT
SYR BULB EAR ULCER 3OZ GRN STR (SYRINGE) ×2 IMPLANT
SYR TOOMEY IRRIG 70ML (MISCELLANEOUS) ×2
SYRINGE TOOMEY IRRIG 70ML (MISCELLANEOUS) IMPLANT
TOWEL OR 17X26 10 PK STRL BLUE (TOWEL DISPOSABLE) ×2 IMPLANT
TRAY FOLEY W/BAG SLVR 14FR LF (SET/KITS/TRAYS/PACK) ×2 IMPLANT

## 2022-10-27 NOTE — Interval H&P Note (Signed)
History and Physical Interval Note:  10/27/2022 11:26 AM  [name]  has presented today for surgery, with the diagnosis of urethral diverticulum.  The various methods of treatment have been discussed with the patient and family. After consideration of risks, benefits and other options for treatment, the patient has consented to  Procedure(s) with comments: urethral diverticulectomy (N/A), cystoscopy as a surgical intervention.  The patient's history has been reviewed, patient examined, no change in status, stable for surgery.  I have reviewed the patient's chart and labs.  Questions were answered to the patient's satisfaction.     [name]

## 2022-10-27 NOTE — Progress Notes (Signed)
Foley catheter care instructions printed and attached to other discharge instructions.

## 2022-10-27 NOTE — Discharge Instructions (Addendum)
POST OPERATIVE INSTRUCTIONS  General Instructions Recovery (not bed rest) will last approximately 6 weeks Walking is encouraged, but refrain from strenuous exercise/ housework/ heavy lifting. No lifting >10lbs  Nothing in the vagina- NO intercourse, tampons or douching Bathing:  Do not submerge in water (NO swimming, bath, hot tub, etc) until after your postop visit. You can shower starting the day after surgery.  No driving until you are not taking narcotic pain medicine and until your pain is well enough controlled that you can slam on the breaks or make sudden movements if needed.  Keep the catheter in place. We will arrange follow up for catheter removal in 10-14 days.   Taking your medications Please take your acetaminophen and ibuprofen on a schedule for the first 48 hours. Take '600mg'$  ibuprofen, then take '500mg'$  acetaminophen 3 hours later, then continue to alternate ibuprofen and acetaminophen. That way you are taking each type of medication every 6 hours. Take the prescribed narcotic (oxycodone, tramadol, etc) as needed, with a maximum being every 4 hours.  Take a stool softener daily to keep your stools soft and preventing you from straining. If you have diarrhea, you decrease your stool softener. This is explained more below. We have prescribed you Miralax.  Reasons to Call the Nurse (see last page for phone numbers) Heavy Bleeding (changing your pad every 1-2 hours) Persistent nausea/vomiting Fever (100.4 degrees or more) Incision problems (pus or other fluid coming out, redness, warmth, increased pain)  Things to Expect After Surgery Mild to Moderate pain is normal during the first day or two after surgery. If prescribed, take Ibuprofen or Tylenol first and use the stronger medicine for "break-through" pain. You can overlap these medicines because they work differently.   Constipation   To Prevent Constipation:  Eat a well-balanced diet including protein, grains, fresh fruit and  vegetables.  Drink plenty of fluids. Walk regularly.  Depending on specific instructions from your physician: take Miralax daily and additionally you can add a stool softener (colace/ docusate) and fiber supplement. Continue as long as you're on pain medications.   To Treat Constipation:  If you do not have a bowel movement in 2 days after surgery, you can take 2 Tbs of Milk of Magnesia 1-2 times a day until you have a bowel movement. If diarrhea occurs, decrease the amount or stop the laxative. If no results with Milk of Magnesia, you can drink a bottle of magnesium citrate which you can purchase over the counter.  Fatigue:  This is a normal response to surgery and will improve with time.  Plan frequent rest periods throughout the day.  Gas Pain:  This is very common but can also be very painful! Drink warm liquids such as herbal teas, bouillon or soup. Walking will help you pass more gas.  Mylicon or Gas-X can be taken over the counter.  Leaking Urine:  Varying amounts of leakage may occur after surgery.  This should improve with time. Your bladder needs at least 3 months to recover from surgery. If you leak after surgery, be sure to mention this to your doctor at your post-op visit. If you were taking medications for overactive bladder prior to surgery, be sure to restart the medications immediately after surgery.  Incisions: If you have incisions on your abdomen, the skin glue will dissolve on its own over time. It is ok to gently rinse with soap and water over these incisions but do not scrub.  Catheter You will be discharged home with  a catheter. We will arrange for you to have a test at the hospital where your bladder is filled with dye and an x-ray is obtained to ensure the bladder has healed. Then we will plan for your catheter to be removed after.   Return to Work  As work demands and recovery times vary widely, it is hard to predict when you will want to return to work. If you have a desk  job with no strenuous physical activity, and if you would like to return sooner than generally recommended, discuss this with your provider or call our office.   Post op concerns  For non-emergent issues, please call the Urogynecology Nurse. Please leave a message and someone will contact you within one business day.  You can also send a message through [place].   AFTER HOURS (After 5:00 PM and on weekends):  For urgent matters that cannot wait until the next business day. Call our office [phone] and connect to the doctor on call.  Please reserve this for important issues.   **FOR ANY TRUE EMERGENCY ISSUES CALL 911 OR GO TO THE NEAREST EMERGENCY ROOM.** Please inform our office or the doctor on call of any emergency.     APPOINTMENTS: Call [phone]    Post Anesthesia Home Care Instructions  Activity: Get plenty of rest for the remainder of the day. A responsible individual must stay with you for 24 hours following the procedure.  For the next 24 hours, DO NOT: -Drive a car -Paediatric nurse -Drink alcoholic beverages -Take any medication unless instructed by your physician -Make any legal decisions or sign important papers.  Meals: Start with liquid foods such as gelatin or soup. Progress to regular foods as tolerated. Avoid greasy, spicy, heavy foods. If nausea and/or vomiting occur, drink only clear liquids until the nausea and/or vomiting subsides. Call your physician if vomiting continues.  Special Instructions/Symptoms: Your throat may feel dry or sore from the anesthesia or the breathing tube placed in your throat during surgery. If this causes discomfort, gargle with warm salt water. The discomfort should disappear within 24 hours.  If you had a scopolamine patch placed behind your ear for the management of post- operative nausea and/or vomiting:  1. The medication in the patch is effective for 72 hours, after which it should be removed.  Wrap patch in a tissue  and discard in the trash. Wash hands thoroughly with soap and water. 2. You may remove the patch earlier than 72 hours if you experience unpleasant side effects which may include dry mouth, dizziness or visual disturbances. 3. Avoid touching the patch. Wash your hands with soap and water after contact with the patch.

## 2022-10-27 NOTE — Anesthesia Postprocedure Evaluation (Signed)
Anesthesia Post Note  Patient: [name]  Procedure(s) Performed: EXAM UNDER ANESTHESIA, URETHRAL DIVERTICULECTOMY, REPAIR OF BLADDER INJURY (Urethra) CYSTOSCOPY (Bladder)     Patient location during evaluation: PACU Anesthesia Type: General Level of consciousness: awake and alert and oriented Pain management: pain level controlled Vital Signs Assessment: post-procedure vital signs reviewed and stable Respiratory status: spontaneous breathing, nonlabored ventilation and respiratory function stable Cardiovascular status: blood pressure returned to baseline and stable Postop Assessment: no apparent nausea or vomiting Anesthetic complications: no   No notable events documented.  Last Vitals:  Vitals:   10/27/22 1059 10/27/22 1626  BP: 131/81 119/69  Pulse: 85 69  Resp: 16 17  Temp: 36.7 C 36.8 C  SpO2: 96% 98%    Last Pain:  Vitals:   10/27/22 1626  TempSrc:   PainSc: Asleep                 Jediah Horger A.

## 2022-10-27 NOTE — Anesthesia Procedure Notes (Signed)
Procedure Name: Intubation Date/Time: 10/27/2022 12:00 PM  Performed by: Clearnce Sorrel, CRNAPre-anesthesia Checklist: Patient identified, Emergency Drugs available, Suction available and Patient being monitored Patient Re-evaluated:Patient Re-evaluated prior to induction Oxygen Delivery Method: Circle System Utilized Preoxygenation: Pre-oxygenation with 100% oxygen Induction Type: IV induction Ventilation: Mask ventilation without difficulty Laryngoscope Size: Mac and 3 Grade View: Grade I Tube type: Oral Tube size: 7.0 mm Number of attempts: 1 Airway Equipment and Method: Stylet and Oral airway Placement Confirmation: ETT inserted through vocal cords under direct vision, positive ETCO2 and breath sounds checked- equal and bilateral Secured at: 22 cm Tube secured with: Tape Dental Injury: Teeth and Oropharynx as per pre-operative assessment

## 2022-10-27 NOTE — Anesthesia Preprocedure Evaluation (Addendum)
Anesthesia Evaluation  Patient identified by MRN, date of birth, ID band Patient awake    Reviewed: Allergy & Precautions, NPO status , Patient's Chart, lab work & pertinent test results  History of Anesthesia Complications (+) PONV, POST - OP SPINAL HEADACHE and history of anesthetic complications  Airway Mallampati: I  TM Distance: >3 FB Neck ROM: Full    Dental  (+) Teeth Intact, Dental Advisory Given   Pulmonary pneumonia, former smoker   breath sounds clear to auscultation       Cardiovascular hypertension, Pt. on medications  Rhythm:Regular Rate:Normal     Neuro/Psych  Headaches PSYCHIATRIC DISORDERS Anxiety Depression       GI/Hepatic Neg liver ROS,GERD  Medicated,,  Endo/Other  diabetes, Well Controlled, Type 2, Insulin Dependent  Morbid obesity  Renal/GU negative Renal ROS     Musculoskeletal  (+) Arthritis , Osteoarthritis,    Abdominal Normal abdominal exam  (+)   Peds  Hematology negative hematology ROS (+)   Anesthesia Other Findings   Reproductive/Obstetrics                             Anesthesia Physical Anesthesia Plan  ASA: 3  Anesthesia Plan: General   Post-op Pain Management: Ofirmev IV (intra-op)*   Induction: Intravenous  PONV Risk Score and Plan: 0 and Ondansetron, TIVA, Dexamethasone and Treatment may vary due to age or medical condition  Airway Management Planned: Oral ETT  Additional Equipment: None  Intra-op Plan:   Post-operative Plan: Extubation in OR  Informed Consent: I have reviewed the patients History and Physical, chart, labs and discussed the procedure including the risks, benefits and alternatives for the proposed anesthesia with the patient or authorized representative who has indicated his/her understanding and acceptance.       Plan Discussed with: CRNA and Anesthesiologist  Anesthesia Plan Comments:         Anesthesia Quick  Evaluation

## 2022-10-27 NOTE — Op Note (Signed)
Operative Note  Preoperative Diagnosis:  urethral diverticulum  Postoperative Diagnosis: same  Procedures performed:  Urethral diverticulectomy, cystoscopy, repair of bladder injury  Implants: * No implants in log *  Attending Surgeon: Sherlene Shams, MD  Anesthesia: General endotracheal  Findings: Diverticulum not immediately visualized on vaginal exam. After dissection, 2cm bilobed diverticulum present on the right lateral aspect of the urethra.    On cystoscopy, normal bladder and urethral mucosa after repair, brisk bilateral ureteral efflux present.   Specimens:  ID Type Source Tests Collected by Time Destination  1 : URETHRAL DIVERTICULUM Tissue PATH GU biopsy SURGICAL PATHOLOGY [name], MD 10/27/2022 1335     Estimated blood loss: 150 mL  IV fluids: 1300 mL  Urine output: 580 mL  Complications: small injury to bladder neck with removal of diverticulum, water tight repair performed.   Procedure in Detail: After informed consent was obtained the patient was taken to the operating room where general anesthesia was induced and found to be adequate.  She was placed in dorsolithotomy position taking care to avoid any nerve injury and prepped and draped in the usual sterile fashion.  Foley catheter was placed and a [place] retractor was placed.  A weighted speculum was also placed in the posterior vagina. Exam under anesthesia did not immediately reveal any suburethral swelling.  An inverted U-shaped incision was marked with a marking pen in the suburethral space and the vaginal wall was injected with 1% lidocaine with epinephrine.  Incision was made with a 15 blade scalpel.  Dissection was performed with Metzenbaum scissors to gently dissect the epithelium off of the vesicovaginal fascia.  The vesicovaginal fascia was then incised vertically with a scalpel.  Dissection was then performed underneath the fascia to free the periurethral tissue.  Tension was placed on  the Foley catheter and a finger was placed in the vagina in order to identify a small swelling on the right lateral urethra.  This was grasped with an Allis clamp and dissection was performed with Metzenbaum scissors to identify the diverticulum.  The diverticulum was dissected down to the urethral base.  The diverticulum was opened with scissors and was noted to be bilobed.  A probe was placed through the urethra and noted to come through the diverticular sac. The diverticulum was then removed with with scissors.  Repair of the urethra was performed with 4-0 Vicryl suture in an interrupted fashion.  Cystourethroscopy  was was then performed with 70 and 30 degree cystoscope and a 360 degree view of the bladder was obtained.  Cystoscopy fluid was noted to be leaking into the vagina at the area of the bladder neck.  Cystoscope was removed and repair of the bladder neck was performed with a 4-0 Vicryl suture in an interrupted fashion.  Repeat cystoscopy was then performed and noted to be watertight in this area however on removal of the cystoscope through the urethra there were noted to be 2 areas of leaking.  Additional sutures were then used to reinforce the urethra with 4-0 Vicryl.  Urethroscopy was again performed to ensure it was watertight.  Cystoscopy was again performed and bilateral ureteral reflux was noted.  A small clot was noted in the bladder and a Toomey syringe was connected to the sheath and used to remove the clot.  The cystoscope was then removed and a Foley catheter was replaced.  The vesicovaginal fascia was then closed with a 3-0 Vicryl suture.  The vaginal epithelium was then closed with a 2-0  Vicryl suture in a running fashion.  2 small areas of bleeding were noted and 2-0 Vicryl interrupted sutures were placed to obtain hemostasis.  Irrigation was performed and good hemostasis was again noted.  The patient tolerated the procedure well and she was awoken and taken to the recovery room in stable  condition.  Needle and sponge count was correct x 2.   [name], MD

## 2022-10-27 NOTE — Transfer of Care (Signed)
Immediate Anesthesia Transfer of Care Note  Patient: [name]  Procedure(s) Performed: Procedure(s) (LRB): EXAM UNDER ANESTHESIA, URETHRAL DIVERTICULECTOMY, REPAIR OF BLADDER INJURY (N/A) CYSTOSCOPY  Patient Location: PACU  Anesthesia Type: General  Level of Consciousness: sedated and sleepy  Airway & Oxygen Therapy: Patient Spontanous Breathing and Patient connected to face mask oxygen  Post-op Assessment: Report given to PACU RN and Post -op Vital signs reviewed and stable  Post vital signs: Reviewed and stable  Complications: No apparent anesthesia complications Last Vitals:  Vitals Value Taken Time  BP 116/65 10/27/22 1630  Temp    Pulse 66 10/27/22 1634  Resp 17 10/27/22 1634  SpO2 96 % 10/27/22 1634  Vitals shown include unvalidated device data.  Last Pain:  Vitals:   10/27/22 1059  TempSrc: Oral  PainSc: 5       Patients Stated Pain Goal: 5 (94/80/16 5537)  Complications: No notable events documented.

## 2022-10-28 ENCOUNTER — Encounter (HOSPITAL_BASED_OUTPATIENT_CLINIC_OR_DEPARTMENT_OTHER): Payer: Self-pay | Admitting: Obstetrics and Gynecology

## 2022-10-28 ENCOUNTER — Other Ambulatory Visit: Payer: Self-pay | Admitting: Obstetrics and Gynecology

## 2022-10-28 ENCOUNTER — Telehealth: Payer: Self-pay | Admitting: Obstetrics and Gynecology

## 2022-10-28 DIAGNOSIS — S3720XD Unspecified injury of bladder, subsequent encounter: Secondary | ICD-10-CM

## 2022-10-28 NOTE — Telephone Encounter (Signed)
Post- Op Call  [name] underwent urethral diverticulectomy, cystoscopy, repair of bladder injury on 10/28/22 . The patient reports that her pain is controlled. She is taking prescribed medications and also phenergan for nausea (she has a prior prescription). She denies vaginal bleeding. She has not had a bowel movement.  Plan was to keep catheter in place 10-14 days. Due to bladder injury/ repair, will plan for fluoro cystogram on POD #11. If no contrast extravasation noted then will have her come for a voiding trial the next day.   Additionally, patient reports that her right foot feels like its asleep. She does not have any pain but just numbness and because of this is unsteady when she walks. She is able to bear weight. We discussed this is likely due to positioning during surgery and usually resolves with time. If she is still having difficulty ambulating in the next week, then we will plan to send her for physical therapy. She is in agreement with this plan.   [name], MD

## 2022-11-01 LAB — SURGICAL PATHOLOGY

## 2022-11-04 ENCOUNTER — Encounter: Payer: Self-pay | Admitting: Obstetrics and Gynecology

## 2022-11-04 NOTE — Telephone Encounter (Signed)
Pt has been contacted.

## 2022-11-04 NOTE — Addendum Note (Signed)
Addended by: [name] on: 11/04/2022 01:13 PM   Modules accepted: Orders

## 2022-11-07 NOTE — Telephone Encounter (Signed)
Pt has been scheduled.  °

## 2022-11-07 NOTE — Addendum Note (Signed)
Addendum  created 11/07/22 0914 by Josephine Igo, MD   Attestation recorded in [place], [place] deleted, [place] filed, Dance movement psychotherapist edited

## 2022-11-08 ENCOUNTER — Ambulatory Visit (HOSPITAL_COMMUNITY)
Admission: RE | Admit: 2022-11-08 | Discharge: 2022-11-08 | Disposition: A | Discharge status: Home / Self Care | Payer: Medicare Other | Source: Ambulatory Visit | Attending: Obstetrics and Gynecology | Admitting: Obstetrics and Gynecology

## 2022-11-08 DIAGNOSIS — S3720XD Unspecified injury of bladder, subsequent encounter: Secondary | ICD-10-CM | POA: Insufficient documentation

## 2022-11-08 MED ORDER — IOTHALAMATE MEGLUMINE 17.2 % UR SOLN
250.0000 mL | Freq: Once | URETHRAL | Status: AC | PRN
  Administered 2022-11-08: 25 mL via INTRAVESICAL

## 2022-11-08 MED ORDER — IOTHALAMATE MEGLUMINE 17.2 % UR SOLN
250.0000 mL | Freq: Once | URETHRAL | Status: AC | PRN
  Administered 2022-11-08: 250 mL via INTRAVESICAL

## 2022-11-10 ENCOUNTER — Ambulatory Visit (INDEPENDENT_AMBULATORY_CARE_PROVIDER_SITE_OTHER): Payer: Medicare Other

## 2022-11-10 DIAGNOSIS — Z9889 Other specified postprocedural states: Secondary | ICD-10-CM

## 2022-11-11 NOTE — Patient Instructions (Signed)
Keep scheduled post op appointments

## 2022-11-11 NOTE — Progress Notes (Signed)
Urogyn Nurse Voiding Trial Note  [name] underwent EXAM UNDER ANESTHESIA, URETHRAL DIVERTICULECTOMY, REPAIR OF BLADDER INJURY  on 10/27/22.  She presents today for a voiding trial .   Patient was identified with 2 indicators. 241m of NS was instilled into the bladder via the catheter. The catheter was removed and patient was instructed to void into the urinary hat. She voided 2036m The post void residual measured by bladder scan was 2542mShe passed the voiding trial and a catheter was not replaced.   The patient received aftercare instructions and will follow up as scheduled.

## 2022-12-07 ENCOUNTER — Ambulatory Visit (INDEPENDENT_AMBULATORY_CARE_PROVIDER_SITE_OTHER): Payer: Medicare Other | Admitting: Obstetrics and Gynecology

## 2022-12-07 ENCOUNTER — Encounter: Payer: Self-pay | Admitting: Obstetrics and Gynecology

## 2022-12-07 VITALS — BP 120/86 | HR 76

## 2022-12-07 DIAGNOSIS — N393 Stress incontinence (female) (male): Secondary | ICD-10-CM

## 2022-12-07 DIAGNOSIS — N3941 Urge incontinence: Secondary | ICD-10-CM

## 2022-12-07 MED ORDER — SOLIFENACIN SUCCINATE 10 MG PO TABS: 10.0000 mg | ORAL_TABLET | Freq: Every day | ORAL | 5 refills | Status: DC

## 2022-12-07 NOTE — Progress Notes (Signed)
[facility]  Date of Visit: 12/07/2022  History of Present Illness: [name] is a 62 y.o. female scheduled today for a post-operative visit.   Surgery: s/p Urethral diverticulectomy, cystoscopy, repair of bladder injury on 10/27/22  Due to bladder/ urethral reconstruction, she had prolonged catheter until 11/10/22 then passed voiding trial. Had cystogram 11/08/22 which showed no bladder extravasation.   Postoperative course has otherwise been uncomplicated.   Today she reports she is doing well overall. Pain has reduced. She is having leakage but has expected this.   UTI in the last 6 weeks? No  Pain? No  unless she had a bladder spasm She has returned to her normal activity (except for postop restrictions) Vaginal bulge? No  Stress incontinence: Yes - was dribbling a lot all day, and that has slowly improved.  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   Allergies: Patient is allergic to latex, penicillins, erythromycin, ofloxacin, sulfonamide derivatives, azithromycin, fenofibrate, and statins.   Physical Exam: BP 120/86   Pulse 76    Pelvic Examination: Cough stress test negative. Vagina: Incisions well healed. Sutures are not present at incision line and there is not granulation tissue. No  tenderness along the anterior or posterior vagina. No apical tenderness.   POP-Q: Deferred, no prolapse  ---------------------------------------------------------  Assessment and Plan:  1. Urge incontinence   2. SUI (stress urinary incontinence, female)     - Pathology results were reviewed and benign.  - Can resume regular activity including exercise and intercourse,  if desired.  - To evaluate leakage, will proceed with urodynamic testing. Had slight SUI prior to surgery and this has worsened as expected post operatively. We discussed the plan for midurethral sling if SUI confirmed.  - For urge incontinence symptoms, will start vesicare '10mg'$  daily. Discussed side effects of dry mouth, dry eyes and constipation.   Return for urodynamics  Wendy Folds, MD

## 2022-12-07 NOTE — Patient Instructions (Signed)

## 2022-12-19 ENCOUNTER — Encounter: Payer: Self-pay | Admitting: *Deleted

## 2022-12-27 NOTE — Progress Notes (Unsigned)
[facility] Urodynamics Procedure  Referring Physician: Sheral Apley Date of Procedure: 12/28/2022  [name] is a 62 y.o. female who presents for urodynamic evaluation. Indication(s) for study: SUI  Vital Signs: There were no vitals taken for this visit.  Laboratory Results: A catheterized urine specimen revealed:  POC urine:    Voiding Diary: The patient voided {NUMBERS; 1-20:18551} times per day. Voided volumes ranged from *** mL to *** mL, with an average of *** mL.  Intervals between voids ranged from *** hr to ***hr with an average interval between voids of *** hr.  Intake per day ranged from ***mL to ***mL.  Output ranged from ***mL to ***mL.  She had *** incontinence episodes per day (*** UUI, *** SUI) and *** episodes of nocturia.  She drinks: *** oz of caffeinated beverages, *** oz of juice, and *** oz of alcohol per day.  Procedure Timeout:  The correct patient was verified and the correct procedure was verified. The patient was in the correct position and safety precautions were reviewed based on at the patient's history.  Urodynamic Procedure A 5F dual lumen urodynamics catheter was placed under sterile conditions into the patient's bladder. A 5F catheter was placed into the {vagina/ rectum:24786} in order to measure abdominal pressure. EMG patches were placed in the appropriate position.  All connections were confirmed and calibrations/adjusted made. Saline was instilled into the bladder through the dual lumen catheters.  Cough/valsalva pressures were measured periodically during filling.  Patient was allowed to void.  The bladder was then emptied of its residual.  UROFLOW: Revealed a Qmax of *** mL/sec.  She voided *** mL and had a residual of *** mL.  It was a {uroflow:24789} pattern and represented normal habits ***though interpretation limited due to low voided volume.***  CMG: This was performed with sterile water in the sitting  position at a fill rate of 30*** mL/min.    First sensation of fullness was *** mLs,  First urge was *** mLs,  Strong urge was *** mLs and  Capacity was *** mLs  Stress incontinence {WAS/WAS NOT:[phone]::"was not"} demonstrated {highest/ lowest:24787} {Desc; negative/positive:13464} CLPP was *** cmH20 at *** ml. {highest/ lowest:24787} {Desc; negative/positive:13464} VLPP was *** cmH20at *** ml. {highest/ lowest:24787} {Desc; negative/positive:13464} Barrier CLPP was *** cmH20 at *** ml. {highest/ lowest:24787} {Desc; negative/positive:13464} Barrier VLPP was *** cmH20 at *** ml.  Detrusor function was {Desc; normal/underactive/overactive:13463}, {With/no:32556} phasic contractions seen.  The first occurred at *** mL to *** cm of water and {WAS/WAS NOT:[phone]::"was not"} associated with {urge/ UW:[id number].  Compliance:  ***. End fill detrusor pressure was ***cmH20.  Calculated compliance was ***DW:[id number]  UPP: MUCP {With/without:5700} barrier reduction was *** cm of water.    MICTURITION STUDY: Voiding was performed {reduction:24791} in the sitting position.  Pdet at Qmax was *** cm of water.  Qmax was *** mL/sec.  It was a {uroflow:24789} pattern.  She voided *** mL and had a residual of *** mL.  It was a volitional void, sustained detrusor contraction was {DESC; PRESENT/NOT PRESENT:21021351} and abdominal straining was {DESC; PRESENT/NOT PRESENT:21021351}  EMG: This was performed with patches.  She had voluntary contractions, recruitment with fill was {DESC; PRESENT/NOT PRESENT:21021351} and urethral sphincter was {relaxed:24792} with void.  The details of the procedure with the study tracings have been scanned into EPIC.   Urodynamic Impression:  1. Sensation was {DESC; NORMAL/REDUCED/ABSENT/INCREASED:23133}; capacity was {DESC; NORMAL/REDUCED/ABSENT/INCREASED:23133} 2. Stress Incontinence {WAS/WAS NOT:[phone]::"was not"} demonstrated at {ISD:24793} pressures; 3. Detrusor  Overactivity {WAS/WAS NOT:[phone]::"was  not"} demonstrated {With-without:32421} leakage. 4. Emptying was {dysfunctional:24794} with a {elevated:24795} PVR, a sustained detrusor contraction {DESC; PRESENT/NOT PRESENT:21021351},  abdominal straining {DESC; PRESENT/NOT PRESENT:21021351}, {dyssynergic:24796} urethral sphincter activity on EMG.  Plan: - The patient will follow up  to discuss the findings and treatment options.

## 2022-12-28 ENCOUNTER — Ambulatory Visit (INDEPENDENT_AMBULATORY_CARE_PROVIDER_SITE_OTHER): Payer: Medicare Other | Admitting: Obstetrics and Gynecology

## 2022-12-28 ENCOUNTER — Encounter: Payer: Self-pay | Admitting: Obstetrics and Gynecology

## 2022-12-28 VITALS — BP 119/81 | HR 88

## 2022-12-28 DIAGNOSIS — R350 Frequency of micturition: Secondary | ICD-10-CM | POA: Diagnosis not present

## 2022-12-28 DIAGNOSIS — N3941 Urge incontinence: Secondary | ICD-10-CM | POA: Diagnosis not present

## 2022-12-28 DIAGNOSIS — N393 Stress incontinence (female) (male): Secondary | ICD-10-CM

## 2022-12-28 LAB — POCT URINALYSIS DIPSTICK
Bilirubin, UA: NEGATIVE
Blood, UA: NEGATIVE
Glucose, UA: NEGATIVE
Ketones, UA: NEGATIVE
Nitrite, UA: NEGATIVE
Protein, UA: POSITIVE — AB
Spec Grav, UA: 1.025 (ref 1.010–1.025)
Urobilinogen, UA: 0.2 E.U./dL
pH, UA: 6 (ref 5.0–8.0)

## 2022-12-28 NOTE — Patient Instructions (Signed)
Taking Care of Yourself after Urodynamics   Drink plenty of water for a day or two following your procedure. Try to have about 8 ounces (one cup) at a time, and do this 6 times or more per day unless you have fluid restrictitons AVOID irritative beverages such as coffee, tea, soda, alcoholic or citrus drinks for a day or two, as this may cause burning with urination.  For the first 1-2 days after the procedure, your urine may be pink or red in color. You may have some blood in your urine as a normal side effect of the procedure. Large amounts of bleeding or difficulty urinating are NOT normal. Call the nurse line if this happens or go to the nearest Emergency Room if the bleeding is heavy or you cannot urinate at all and it is after hours. If you had a Bulkamid injection in the urethra and need to be catheterized, ask for a pediatric catheter to be used (size 10 or 12-French) so the material is not pushed out of place.   You may experience some discomfort or a burning sensation with urination after having this procedure. You can use over the counter Azo or pyridium to help with burning and follow the instructions on the packaging. If it does not improve within 1-2 days, or other symptoms appear (fever, chills, or difficulty urinating) call the office to speak to a nurse.  You may return to normal daily activities such as work, school, driving, exercising and housework on the day of the procedure.

## 2023-01-24 ENCOUNTER — Ambulatory Visit (INDEPENDENT_AMBULATORY_CARE_PROVIDER_SITE_OTHER): Payer: Medicare Other | Admitting: Obstetrics and Gynecology

## 2023-01-24 ENCOUNTER — Other Ambulatory Visit: Payer: Self-pay

## 2023-01-24 ENCOUNTER — Encounter (HOSPITAL_BASED_OUTPATIENT_CLINIC_OR_DEPARTMENT_OTHER): Payer: Self-pay | Admitting: Obstetrics and Gynecology

## 2023-01-24 ENCOUNTER — Encounter: Payer: Self-pay | Admitting: Obstetrics and Gynecology

## 2023-01-24 VITALS — BP 110/74 | HR 76 | Ht 66.4 in | Wt 186.0 lb

## 2023-01-24 DIAGNOSIS — N393 Stress incontinence (female) (male): Secondary | ICD-10-CM | POA: Diagnosis not present

## 2023-01-24 MED ORDER — IBUPROFEN 800 MG PO TABS: 800.0000 mg | ORAL_TABLET | Freq: Three times a day (TID) | ORAL | 0 refills | Status: DC | PRN

## 2023-01-24 NOTE — Progress Notes (Signed)
Spoke w/ via phone for pre-op interview: self Lab needs dos: BMP Lab results: EKG 10/27/22 COVID test: patient states asymptomatic no test needed. Arrive at 1000 01/30/23 NPO after MN except clear liquids.Clear liquids from MN until 0900 Med rec completed. Medications to take morning of surgery: amlodipine, Cymbalta, Prozac, and Protonix only; hold Aspirin, vitamins/supplements, Advil, and Ozempic until post procedure Patient instructed no nail polish to be worn day of surgery. Patient instructed to bring photo id and insurance card day of surgery. Patient aware to have Driver (ride ) / caregiver for 24 hours after surgery. (Husband to drive.) Patient Special Instructions: NA Pre-Op special Istructions:NA Patient verbalized understanding of instructions that were given at this phone interview. Patient denies shortness of breath, chest pain, fever, cough at this phone interview.  *Pt states she took Ozempic this morning (01/23/22) before 0800. Dr. Royce Macadamia, anesthesiologist, notified. Per aforesaid, no need to re-schedule surgery d/t this.

## 2023-01-24 NOTE — Progress Notes (Signed)
[facility] Pre-Operative visit  Subjective Chief Complaint: [name] presents for a preoperative encounter.   History of Present Illness: [name] is a 62 y.o. female who presents for preoperative visit.  She is scheduled to undergo Exam under anesthesia, midurethral sling, cystoscopy on 01/30/23.  Her symptoms include urinary incontinence after undergoing urethral diverticulectomy on 10/27/22  Urodynamic Impression (12/28/22):  1. Sensation was normal; capacity was normal 2. Stress Incontinence was demonstrated at normal pressures; 3. Detrusor Overactivity was not demonstrated with leakage. 4. Emptying was normal with a normal PVR, a sustained detrusor contraction present,  abdominal straining present, dyssynergic urethral sphincter activity on EMG.  Past Medical History:  Diagnosis Date   Anxiety    Barrett esophagus    Dr. Olevia Perches in GI   Cancer [facility])    Skin Cancer   Chronic back pain    stenosis and DDD   DDD (degenerative disc disease)    s/p back surgeries and steroid injxns   Depression    takes Fluoxetine daily   Diabetes mellitus    Victoza daily   GERD (gastroesophageal reflux disease)    takes Nexium daily   History of kidney stones    Hyperlipidemia    hasn't been on Pravastatin in 6-7wks    Hypertension    takes Amlodipine and HCTZ daily   Interstitial cystitis    Joint pain    Joint swelling    Muscle spasm    takes Ativan nightly as needed   Numbness    weakness in right leg   Pneumonia    fall 2014   PONV (postoperative nausea and vomiting)    Spinal headache    blood patch required    Urinary frequency      Past Surgical History:  Procedure Laterality Date   adhesions removed from abdomen     APPENDECTOMY     BACK SURGERY  12/2007   x 6-fusion x 2   BIOPSY  05/19/2022   Procedure: BIOPSY;  Surgeon: Arta Silence, MD;  Location: [facility];  Service: Gastroenterology;;   CARDIAC CATHETERIZATION  2013   CESAREAN  SECTION  1988/1989   CHOLECYSTECTOMY     COLONOSCOPY  2011   COLONOSCOPY WITH PROPOFOL N/A 05/19/2022   Procedure: COLONOSCOPY WITH PROPOFOL;  Surgeon: Arta Silence, MD;  Location: [facility];  Service: Gastroenterology;  Laterality: N/A;   CYSTOSCOPY  10/27/2022   Procedure: CYSTOSCOPY;  Surgeon: [name], MD;  Location: [facility];  Service: Gynecology;;   epidural injections     KNEE ARTHROSCOPY Bilateral    LAPAROSCOPIC LYSIS OF ADHESIONS N/A 04/15/2014   Procedure: LAPAROSCOPIC LYSIS OF ADHESION;  Surgeon: Joyice Faster. Cornett, MD;  Location: [facility];  Service: General;  Laterality: N/A;   LAPAROSCOPY N/A 04/15/2014   Procedure: LAPAROSCOPY DIAGNOSTIC;  Surgeon: Joyice Faster. Cornett, MD;  Location: [facility];  Service: General;  Laterality: N/A;   LEFT HEART CATHETERIZATION WITH CORONARY ANGIOGRAM N/A 02/03/2012   Procedure: LEFT HEART CATHETERIZATION WITH CORONARY ANGIOGRAM;  Surgeon: Troy Sine, MD;  Location: [facility];  Service: Cardiovascular;  Laterality: N/A;   LUMBAR FUSION Right 03/24/2015   Procedure: SI joint fusion - right ;  Surgeon: Karie Chimera, MD;  Location: [facility];  Service: Neurosurgery;  Laterality: Right;  SI joint fusion - right    NECK SURGERY  12/2007   OOPHORECTOMY     TOTAL ABDOMINAL HYSTERECTOMY     TOTAL KNEE ARTHROPLASTY Right 12/25/2018  TOTAL KNEE ARTHROPLASTY Right 12/25/2018   Procedure: RIGHT TOTAL KNEE ARTHROPLASTY;  Surgeon: Meredith Pel, MD;  Location: [facility];  Service: Orthopedics;  Laterality: Right;   upper gi endoscopy     URETHRAL CYST REMOVAL N/A 10/27/2022   Procedure: EXAM UNDER ANESTHESIA, URETHRAL DIVERTICULECTOMY, REPAIR OF BLADDER INJURY;  Surgeon: [name], MD;  Location: [facility];  Service: Gynecology;  Laterality: N/A;    is allergic to latex, penicillins, erythromycin, ofloxacin, sulfonamide derivatives, azithromycin, fenofibrate, and statins.   Family History   Problem Relation Age of Onset   Kidney disease Mother    Heart disease Mother        ischemic   Gallbladder disease Mother    Other Mother        rectovaginal fistula   Hypertension Father    Colon polyps Father    Allergies Sister    Asthma Sister     Social History   Tobacco Use   Smoking status: Former    Packs/day: 0.30    Years: 17.00    Additional pack years: 0.00    Total pack years: 5.10    Types: Cigarettes   Smokeless tobacco: Never   Tobacco comments:    quit smoking 2 1/2 yrs ago  Vaping Use   Vaping Use: Never used  Substance Use Topics   Alcohol use: Yes    Comment: rarely   Drug use: No     Review of Systems was negative for a full 10 system review except as noted in the History of Present Illness.   Current Outpatient Medications:    acetaminophen (TYLENOL) 500 MG tablet, Take 1 tablet (500 mg total) by mouth every 6 (six) hours as needed (pain)., Disp: 30 tablet, Rfl: 0   amLODipine (NORVASC) 5 MG tablet, Take 5 mg by mouth daily., Disp: , Rfl:    aspirin 81 MG chewable tablet, Chew 1 tablet (81 mg total) by mouth 2 (two) times daily. Hold asa if you have active bleeding, Disp: 42 tablet, Rfl: 0   cyclobenzaprine (FLEXERIL) 10 MG tablet, Take 1 tablet (10 mg total) by mouth 3 (three) times daily as needed for muscle spasms., Disp: 30 tablet, Rfl: 0   DULoxetine (CYMBALTA) 20 MG capsule, Take 20 mg by mouth daily., Disp: , Rfl:    ELDERBERRY PO, Take by mouth., Disp: , Rfl:    ezetimibe (ZETIA) 10 MG tablet, Take 10 mg by mouth daily., Disp: , Rfl:    FLUoxetine (PROZAC) 40 MG capsule, Take 1 capsule (40 mg total) by mouth daily. NEEDS APPT FOR REFILLS, Disp: 90 capsule, Rfl: 0   hydrochlorothiazide (HYDRODIURIL) 25 MG tablet, Take 1 tablet (25 mg total) by mouth daily. NEEDS APPT FOR REFILLS, Disp: 90 tablet, Rfl: 0   HYDROcodone-acetaminophen (NORCO/VICODIN) 5-325 MG tablet, 1 po q 4-6hrs prn pain (Patient taking differently: Take 1 tablet by mouth  every 4 (four) hours as needed for moderate pain.), Disp: 45 tablet, Rfl: 0   ibuprofen (ADVIL) 600 MG tablet, Take 1 tablet (600 mg total) by mouth every 6 (six) hours as needed., Disp: 30 tablet, Rfl: 0   ibuprofen (ADVIL) 800 MG tablet, Take 1 tablet (800 mg total) by mouth every 8 (eight) hours as needed., Disp: 30 tablet, Rfl: 0   insulin lispro (HUMALOG) 100 UNIT/ML injection, Inject 2 Units into the skin See admin instructions. Inject 2 units SQ up to 6 times daily as needed for BS > 250, Disp: , Rfl:  OZEMPIC, 1 MG/DOSE, 4 MG/3ML SOPN, Inject 1 mg into the skin once a week., Disp: , Rfl:    pantoprazole (PROTONIX) 40 MG tablet, Take 40 mg by mouth 2 (two) times daily., Disp: , Rfl:    potassium chloride (KLOR-CON) 10 MEQ tablet, Take 10 mEq by mouth 2 (two) times daily. Takes every other month per pt, Disp: , Rfl:    solifenacin (VESICARE) 10 MG tablet, Take 1 tablet (10 mg total) by mouth daily., Disp: 30 tablet, Rfl: 5   TRESIBA FLEXTOUCH 100 UNIT/ML SOPN FlexTouch Pen, Inject 50 Units into the skin daily. , Disp: , Rfl: 5   Objective Vitals:   01/24/23 0927  BP: 110/74  Pulse: 76    Gen: NAD CV: S1 S2 RRR Lungs: Clear to auscultation bilaterally Abd: soft, nontender   Assessment/ Plan  Assessment: The patient is a 62 y.o. year old scheduled to undergo Exam under anesthesia, midurethral sling, cystoscopy. Verbal consent was obtained for these procedures.  Plan: General Surgical Consent: The patient has previously been counseled on alternative treatments, and the decision by the patient and provider was to proceed with the procedure listed above.  For all procedures, there are risks of bleeding, infection, damage to surrounding organs including but not limited to bowel, bladder, blood vessels, ureters and nerves, and need for further surgery if an injury were to occur. These risks are all low with minimally invasive surgery.   There are risks of numbness and weakness at  any body site or buttock/rectal pain.  It is possible that baseline pain can be worsened by surgery, either with or without mesh. If surgery is vaginal, there is also a low risk of possible conversion to laparoscopy or open abdominal incision where indicated. Very rare risks include blood transfusion, blood clot, heart attack, pneumonia, or death.   There is also a risk of short-term postoperative urinary retention with need to use a catheter. About half of patients need to go home from surgery with a catheter, which is then later removed in the office. The risk of long-term need for a catheter is very low. There is also a risk of worsening of overactive bladder.   Sling: The effectiveness of a midurethral vaginal mesh sling is approximately 85%, and thus, there will be times when you may leak urine after surgery, especially if your bladder is full or if you have a strong cough. There is a balance between making the sling tight enough to treat your leakage but not too tight so that you have long-term difficulty emptying your bladder. A mesh sling will not directly treat overactive bladder/urge incontinence and may worsen it.  There is an FDA safety notification on vaginal mesh procedures for prolapse but NOT mesh slings. We have extensive experience and training with mesh placement and we have close postoperative follow up to identify any potential complications from mesh. It is important to realize that this mesh is a permanent implant that cannot be easily removed. There are rare risks of mesh exposure (2-4%), pain with intercourse (0-7%), and infection (<1%). The risk of mesh exposure if more likely in a woman with risks for poor healing (prior radiation, poorly controlled diabetes, or immunocompromised). The risk of new or worsened chronic pain after mesh implant is more common in women with baseline chronic pain and/or poorly controlled anxiety or depression. Approximately 2-4% of patients will experience  longer-term post-operative voiding dysfunction that may require surgical revision of the sling. We also reviewed that postoperatively, her  stream may not be as strong as before surgery.   We discussed consent for blood products. Risks for blood transfusion include allergic reactions, other reactions that can affect different body organs and managed accordingly, transmission of infectious diseases such as HIV or Hepatitis. However, the blood is screened. Patient consents for blood products.  Pre-operative instructions:  She was instructed to not take Aspirin/NSAIDs x 7days prior to surgery.  Antibiotic prophylaxis was ordered as indicated.  Catheter use: Patient will go home with foley if needed after post-operative voiding trial.  Post-operative instructions:  She was provided with specific post-operative instructions, including precautions and signs/symptoms for which we would recommend contacting us, in addition to daytime and after-hours contact phone numbers. This was provided on a handout.   Post-operative medications: Prescriptions for motrin, tylenol, miralax, and oxycodone were sent to her pharmacy. Discussed using ibuprofen and tylenol on a schedule to limit use of narcotics.   Laboratory testing:  No labs required  Preoperative clearance:  She does not require surgical clearance.    Post-operative follow-up:  A post-operative appointment will be made for 6 weeks from the date of surgery. If she needs a post-operative nurse visit for a voiding trial, that will be set up after she leaves the hospital.    Patient will call the clinic or use MyChart should anything change or any new issues arise.   [name], MD   Time spent: I spent 20 minutes dedicated to the care of this patient on the date of this encounter to include pre-visit review of records, face-to-face time with the patient discussing surgery and post visit documentation and ordering medication/ testing.

## 2023-01-24 NOTE — H&P (Signed)
[facility] Pre-Operative H&P  Subjective Chief Complaint: [name] presents for a preoperative encounter.   History of Present Illness: [name] is a 62 y.o. female who presents for preoperative visit.  She is scheduled to undergo Exam under anesthesia, midurethral sling, cystoscopy on 01/30/23.  Her symptoms include urinary incontinence after undergoing urethral diverticulectomy on 10/27/22  Urodynamic Impression (12/28/22):  1. Sensation was normal; capacity was normal 2. Stress Incontinence was demonstrated at normal pressures; 3. Detrusor Overactivity was not demonstrated with leakage. 4. Emptying was normal with a normal PVR, a sustained detrusor contraction present,  abdominal straining present, dyssynergic urethral sphincter activity on EMG.  Past Medical History:  Diagnosis Date   Anxiety    Barrett esophagus    Dr. Olevia Perches in GI   Cancer [facility])    Skin Cancer   Chronic back pain    stenosis and DDD   DDD (degenerative disc disease)    s/p back surgeries and steroid injxns   Depression    takes Fluoxetine daily   Diabetes mellitus    Victoza daily   GERD (gastroesophageal reflux disease)    takes Nexium daily   History of kidney stones    Hyperlipidemia    hasn't been on Pravastatin in 6-7wks    Hypertension    takes Amlodipine and HCTZ daily   Interstitial cystitis    Joint pain    Joint swelling    Muscle spasm    takes Ativan nightly as needed   Numbness    weakness in right leg   Pneumonia    fall 2014   PONV (postoperative nausea and vomiting)    Spinal headache    blood patch required    Urinary frequency      Past Surgical History:  Procedure Laterality Date   adhesions removed from abdomen     APPENDECTOMY     BACK SURGERY  12/2007   x 6-fusion x 2   BIOPSY  05/19/2022   Procedure: BIOPSY;  Surgeon: Arta Silence, MD;  Location: [facility];  Service: Gastroenterology;;   CARDIAC CATHETERIZATION  2013   CESAREAN  SECTION  1988/1989   CHOLECYSTECTOMY     COLONOSCOPY  2011   COLONOSCOPY WITH PROPOFOL N/A 05/19/2022   Procedure: COLONOSCOPY WITH PROPOFOL;  Surgeon: Arta Silence, MD;  Location: [facility];  Service: Gastroenterology;  Laterality: N/A;   CYSTOSCOPY  10/27/2022   Procedure: CYSTOSCOPY;  Surgeon: [name], MD;  Location: [facility];  Service: Gynecology;;   epidural injections     KNEE ARTHROSCOPY Bilateral    LAPAROSCOPIC LYSIS OF ADHESIONS N/A 04/15/2014   Procedure: LAPAROSCOPIC LYSIS OF ADHESION;  Surgeon: Joyice Faster. Cornett, MD;  Location: [place];  Service: General;  Laterality: N/A;   LAPAROSCOPY N/A 04/15/2014   Procedure: LAPAROSCOPY DIAGNOSTIC;  Surgeon: Joyice Faster. Cornett, MD;  Location: [place];  Service: General;  Laterality: N/A;   LEFT HEART CATHETERIZATION WITH CORONARY ANGIOGRAM N/A 02/03/2012   Procedure: LEFT HEART CATHETERIZATION WITH CORONARY ANGIOGRAM;  Surgeon: Troy Sine, MD;  Location: [facility] CATH LAB;  Service: Cardiovascular;  Laterality: N/A;   LUMBAR FUSION Right 03/24/2015   Procedure: SI joint fusion - right ;  Surgeon: Karie Chimera, MD;  Location: [place] [facility];  Service: Neurosurgery;  Laterality: Right;  SI joint fusion - right    NECK SURGERY  12/2007   OOPHORECTOMY     TOTAL ABDOMINAL HYSTERECTOMY     TOTAL KNEE ARTHROPLASTY Right 12/25/2018  TOTAL KNEE ARTHROPLASTY Right 12/25/2018   Procedure: RIGHT TOTAL KNEE ARTHROPLASTY;  Surgeon: Meredith Pel, MD;  Location: [place];  Service: Orthopedics;  Laterality: Right;   upper gi endoscopy     URETHRAL CYST REMOVAL N/A 10/27/2022   Procedure: EXAM UNDER ANESTHESIA, URETHRAL DIVERTICULECTOMY, REPAIR OF BLADDER INJURY;  Surgeon: [name], MD;  Location: [facility];  Service: Gynecology;  Laterality: N/A;    is allergic to latex, penicillins, erythromycin, ofloxacin, sulfonamide derivatives, azithromycin, fenofibrate, and statins.   Family History   Problem Relation Age of Onset   Kidney disease Mother    Heart disease Mother        ischemic   Gallbladder disease Mother    Other Mother        rectovaginal fistula   Hypertension Father    Colon polyps Father    Allergies Sister    Asthma Sister     Social History   Tobacco Use   Smoking status: Former    Packs/day: 0.30    Years: 17.00    Additional pack years: 0.00    Total pack years: 5.10    Types: Cigarettes   Smokeless tobacco: Never   Tobacco comments:    quit smoking 2 1/2 yrs ago  Vaping Use   Vaping Use: Never used  Substance Use Topics   Alcohol use: Yes    Comment: rarely   Drug use: No     Review of Systems was negative for a full 10 system review except as noted in the History of Present Illness.  No current facility-administered medications for this encounter.  Current Outpatient Medications:    acetaminophen (TYLENOL) 500 MG tablet, Take 1 tablet (500 mg total) by mouth every 6 (six) hours as needed (pain)., Disp: 30 tablet, Rfl: 0   amLODipine (NORVASC) 5 MG tablet, Take 5 mg by mouth daily., Disp: , Rfl:    aspirin 81 MG chewable tablet, Chew 1 tablet (81 mg total) by mouth 2 (two) times daily. Hold asa if you have active bleeding, Disp: 42 tablet, Rfl: 0   cyclobenzaprine (FLEXERIL) 10 MG tablet, Take 1 tablet (10 mg total) by mouth 3 (three) times daily as needed for muscle spasms., Disp: 30 tablet, Rfl: 0   DULoxetine (CYMBALTA) 20 MG capsule, Take 20 mg by mouth daily., Disp: , Rfl:    ELDERBERRY PO, Take by mouth., Disp: , Rfl:    ezetimibe (ZETIA) 10 MG tablet, Take 10 mg by mouth daily., Disp: , Rfl:    FLUoxetine (PROZAC) 40 MG capsule, Take 1 capsule (40 mg total) by mouth daily. NEEDS APPT FOR REFILLS, Disp: 90 capsule, Rfl: 0   hydrochlorothiazide (HYDRODIURIL) 25 MG tablet, Take 1 tablet (25 mg total) by mouth daily. NEEDS APPT FOR REFILLS, Disp: 90 tablet, Rfl: 0   HYDROcodone-acetaminophen (NORCO/VICODIN) 5-325 MG tablet, 1 po q  4-6hrs prn pain (Patient taking differently: Take 1 tablet by mouth every 4 (four) hours as needed for moderate pain.), Disp: 45 tablet, Rfl: 0   ibuprofen (ADVIL) 600 MG tablet, Take 1 tablet (600 mg total) by mouth every 6 (six) hours as needed., Disp: 30 tablet, Rfl: 0   ibuprofen (ADVIL) 800 MG tablet, Take 1 tablet (800 mg total) by mouth every 8 (eight) hours as needed., Disp: 30 tablet, Rfl: 0   insulin lispro (HUMALOG) 100 UNIT/ML injection, Inject 2 Units into the skin See admin instructions. Inject 2 units SQ up to 6 times daily as needed for  BS > 250, Disp: , Rfl:    OZEMPIC, 1 MG/DOSE, 4 MG/3ML SOPN, Inject 1 mg into the skin once a week., Disp: , Rfl:    pantoprazole (PROTONIX) 40 MG tablet, Take 40 mg by mouth 2 (two) times daily., Disp: , Rfl:    potassium chloride (KLOR-CON) 10 MEQ tablet, Take 10 mEq by mouth 2 (two) times daily. Takes every other month per pt, Disp: , Rfl:    solifenacin (VESICARE) 10 MG tablet, Take 1 tablet (10 mg total) by mouth daily., Disp: 30 tablet, Rfl: 5   TRESIBA FLEXTOUCH 100 UNIT/ML SOPN FlexTouch Pen, Inject 50 Units into the skin daily. , Disp: , Rfl: 5   Objective There were no vitals filed for this visit.   Gen: NAD CV: S1 S2 RRR Lungs: Clear to auscultation bilaterally Abd: soft, nontender   Assessment/ Plan  The patient is a 62 y.o. year old scheduled to undergo Exam under anesthesia, midurethral sling, cystoscopy.  [name], MD

## 2023-01-29 MED ORDER — CLINDAMYCIN PHOSPHATE 900 MG/50ML IV SOLN
900.0000 mg | INTRAVENOUS | Status: AC
  Administered 2023-01-30: 900 mg via INTRAVENOUS

## 2023-01-29 MED ORDER — GENTAMICIN SULFATE 40 MG/ML IJ SOLN
5.0000 mg/kg | INTRAVENOUS | Status: AC
  Administered 2023-01-30: 350 mg via INTRAVENOUS
  Filled 2023-01-29: qty 8.75

## 2023-01-30 ENCOUNTER — Telehealth: Payer: Self-pay | Admitting: Obstetrics and Gynecology

## 2023-01-30 ENCOUNTER — Ambulatory Visit (HOSPITAL_BASED_OUTPATIENT_CLINIC_OR_DEPARTMENT_OTHER)
Admission: RE | Admit: 2023-01-30 | Discharge: 2023-01-30 | Disposition: A | Discharge status: Home / Self Care | Payer: Medicare Other | Attending: Obstetrics and Gynecology | Admitting: Obstetrics and Gynecology

## 2023-01-30 ENCOUNTER — Encounter (HOSPITAL_BASED_OUTPATIENT_CLINIC_OR_DEPARTMENT_OTHER): Payer: Self-pay | Admitting: Obstetrics and Gynecology

## 2023-01-30 ENCOUNTER — Other Ambulatory Visit: Payer: Self-pay

## 2023-01-30 ENCOUNTER — Ambulatory Visit (HOSPITAL_BASED_OUTPATIENT_CLINIC_OR_DEPARTMENT_OTHER): Payer: Medicare Other | Admitting: Anesthesiology

## 2023-01-30 ENCOUNTER — Encounter (HOSPITAL_BASED_OUTPATIENT_CLINIC_OR_DEPARTMENT_OTHER)
Admission: RE | Disposition: A | Discharge status: Home / Self Care | Payer: Self-pay | Source: Home / Self Care | Attending: Obstetrics and Gynecology

## 2023-01-30 DIAGNOSIS — Z7985 Long-term (current) use of injectable non-insulin antidiabetic drugs: Secondary | ICD-10-CM | POA: Insufficient documentation

## 2023-01-30 DIAGNOSIS — I10 Essential (primary) hypertension: Secondary | ICD-10-CM

## 2023-01-30 DIAGNOSIS — E119 Type 2 diabetes mellitus without complications: Secondary | ICD-10-CM

## 2023-01-30 DIAGNOSIS — Z87891 Personal history of nicotine dependence: Secondary | ICD-10-CM | POA: Diagnosis not present

## 2023-01-30 DIAGNOSIS — Z794 Long term (current) use of insulin: Secondary | ICD-10-CM | POA: Diagnosis not present

## 2023-01-30 DIAGNOSIS — F418 Other specified anxiety disorders: Secondary | ICD-10-CM | POA: Insufficient documentation

## 2023-01-30 DIAGNOSIS — Z01818 Encounter for other preprocedural examination: Secondary | ICD-10-CM

## 2023-01-30 DIAGNOSIS — N393 Stress incontinence (female) (male): Secondary | ICD-10-CM | POA: Insufficient documentation

## 2023-01-30 DIAGNOSIS — K219 Gastro-esophageal reflux disease without esophagitis: Secondary | ICD-10-CM | POA: Diagnosis not present

## 2023-01-30 HISTORY — PX: BLADDER SUSPENSION: SHX72

## 2023-01-30 HISTORY — PX: CYSTOSCOPY: SHX5120

## 2023-01-30 LAB — GLUCOSE, CAPILLARY
Glucose-Capillary: 99 mg/dL (ref 70–99)
Glucose-Capillary: 113 mg/dL — ABNORMAL HIGH (ref 70–99)

## 2023-01-30 LAB — BASIC METABOLIC PANEL
Anion gap: 11 (ref 5–15)
BUN: 20 mg/dL (ref 6–20)
CO2: 28 mmol/L (ref 22–32)
Calcium: 9.3 mg/dL (ref 8.9–10.3)
Chloride: 96 mmol/L — ABNORMAL LOW (ref 98–111)
Creatinine, Ser: 0.95 mg/dL (ref 0.44–1.00)
GFR, Estimated: >60 mL/min (ref >60)
Glucose, Bld: 103 mg/dL — ABNORMAL HIGH (ref 70–99)
Potassium: 2.7 mmol/L — CL (ref 3.5–5.1)
Sodium: 135 mmol/L (ref 135–145)

## 2023-01-30 SURGERY — URETHROPEXY, USING TRANSVAGINAL TAPE
Anesthesia: General

## 2023-01-30 MED ORDER — SODIUM CHLORIDE 0.9 % IR SOLN
Status: DC | PRN
  Administered 2023-01-30: 1000 mL

## 2023-01-30 MED ORDER — GABAPENTIN 300 MG PO CAPS
ORAL_CAPSULE | ORAL | Status: AC
  Filled 2023-01-30: qty 1

## 2023-01-30 MED ORDER — LACTATED RINGERS IV SOLN: INTRAVENOUS | Status: DC

## 2023-01-30 MED ORDER — ONDANSETRON HCL 4 MG/2ML IJ SOLN
INTRAMUSCULAR | Status: AC
  Filled 2023-01-30: qty 2

## 2023-01-30 MED ORDER — OXYCODONE HCL 5 MG/5ML PO SOLN: 5.0000 mg | Freq: Once | ORAL | Status: AC | PRN

## 2023-01-30 MED ORDER — PHENYLEPHRINE HCL (PRESSORS) 10 MG/ML IV SOLN
INTRAVENOUS | Status: DC | PRN
  Administered 2023-01-30 (×2): 80 ug via INTRAVENOUS

## 2023-01-30 MED ORDER — SCOPOLAMINE 1 MG/3DAYS TD PT72
MEDICATED_PATCH | TRANSDERMAL | Status: AC
  Filled 2023-01-30: qty 1

## 2023-01-30 MED ORDER — FENTANYL CITRATE (PF) 100 MCG/2ML IJ SOLN
INTRAMUSCULAR | Status: AC
  Filled 2023-01-30: qty 2

## 2023-01-30 MED ORDER — FENTANYL CITRATE (PF) 100 MCG/2ML IJ SOLN
INTRAMUSCULAR | Status: DC | PRN
  Administered 2023-01-30 (×3): 50 ug via INTRAVENOUS

## 2023-01-30 MED ORDER — GABAPENTIN 300 MG PO CAPS
300.0000 mg | ORAL_CAPSULE | ORAL | Status: AC
  Administered 2023-01-30: 300 mg via ORAL

## 2023-01-30 MED ORDER — MIDAZOLAM HCL 5 MG/5ML IJ SOLN
INTRAMUSCULAR | Status: DC | PRN
  Administered 2023-01-30: 2 mg via INTRAVENOUS

## 2023-01-30 MED ORDER — OXYCODONE HCL 5 MG PO TABS
ORAL_TABLET | ORAL | Status: AC
  Filled 2023-01-30: qty 1

## 2023-01-30 MED ORDER — LIDOCAINE 2% (20 MG/ML) 5 ML SYRINGE
INTRAMUSCULAR | Status: DC | PRN
  Administered 2023-01-30: 50 mg via INTRAVENOUS

## 2023-01-30 MED ORDER — LIDOCAINE-EPINEPHRINE 1 %-1:100000 IJ SOLN
INTRAMUSCULAR | Status: DC | PRN
  Administered 2023-01-30: 10 mL

## 2023-01-30 MED ORDER — PHENAZOPYRIDINE HCL 100 MG PO TABS
200.0000 mg | ORAL_TABLET | ORAL | Status: AC
  Administered 2023-01-30: 200 mg via ORAL

## 2023-01-30 MED ORDER — DEXAMETHASONE SODIUM PHOSPHATE 10 MG/ML IJ SOLN
INTRAMUSCULAR | Status: DC | PRN
  Administered 2023-01-30: 10 mg via INTRAVENOUS

## 2023-01-30 MED ORDER — ACETAMINOPHEN 500 MG PO TABS
ORAL_TABLET | ORAL | Status: AC
  Filled 2023-01-30: qty 2

## 2023-01-30 MED ORDER — OXYCODONE HCL 5 MG PO TABS
5.0000 mg | ORAL_TABLET | Freq: Once | ORAL | Status: AC | PRN
  Administered 2023-01-30: 5 mg via ORAL

## 2023-01-30 MED ORDER — MIDAZOLAM HCL 2 MG/2ML IJ SOLN: 0.5000 mg | Freq: Once | INTRAMUSCULAR | Status: DC | PRN

## 2023-01-30 MED ORDER — STERILE WATER FOR IRRIGATION IR SOLN
Status: DC | PRN
  Administered 2023-01-30: 500 mL

## 2023-01-30 MED ORDER — PROPOFOL 10 MG/ML IV BOLUS
INTRAVENOUS | Status: AC
  Filled 2023-01-30: qty 20

## 2023-01-30 MED ORDER — DEXAMETHASONE SODIUM PHOSPHATE 10 MG/ML IJ SOLN
INTRAMUSCULAR | Status: AC
  Filled 2023-01-30: qty 1

## 2023-01-30 MED ORDER — PROPOFOL 1000 MG/100ML IV EMUL
INTRAVENOUS | Status: AC
  Filled 2023-01-30: qty 100

## 2023-01-30 MED ORDER — MIDAZOLAM HCL 2 MG/2ML IJ SOLN
INTRAMUSCULAR | Status: AC
  Filled 2023-01-30: qty 2

## 2023-01-30 MED ORDER — PROPOFOL 10 MG/ML IV BOLUS
INTRAVENOUS | Status: DC | PRN
  Administered 2023-01-30: 200 mg via INTRAVENOUS
  Administered 2023-01-30: 30 mg via INTRAVENOUS

## 2023-01-30 MED ORDER — FENTANYL CITRATE (PF) 100 MCG/2ML IJ SOLN: 25.0000 ug | INTRAMUSCULAR | Status: DC | PRN

## 2023-01-30 MED ORDER — MEPERIDINE HCL 25 MG/ML IJ SOLN: 6.2500 mg | INTRAMUSCULAR | Status: DC | PRN

## 2023-01-30 MED ORDER — PROPOFOL 500 MG/50ML IV EMUL
INTRAVENOUS | Status: DC | PRN
  Administered 2023-01-30: 200 ug/kg/min via INTRAVENOUS

## 2023-01-30 MED ORDER — PROMETHAZINE HCL 25 MG/ML IJ SOLN: 6.2500 mg | INTRAMUSCULAR | Status: DC | PRN

## 2023-01-30 MED ORDER — ACETAMINOPHEN 500 MG PO TABS
1000.0000 mg | ORAL_TABLET | ORAL | Status: AC
  Administered 2023-01-30: 1000 mg via ORAL

## 2023-01-30 MED ORDER — PHENAZOPYRIDINE HCL 100 MG PO TABS
ORAL_TABLET | ORAL | Status: AC
  Filled 2023-01-30: qty 2

## 2023-01-30 MED ORDER — CLINDAMYCIN PHOSPHATE 900 MG/50ML IV SOLN
INTRAVENOUS | Status: AC
  Filled 2023-01-30: qty 50

## 2023-01-30 MED ORDER — ONDANSETRON HCL 4 MG/2ML IJ SOLN
INTRAMUSCULAR | Status: DC | PRN
  Administered 2023-01-30: 4 mg via INTRAVENOUS

## 2023-01-30 MED ORDER — SCOPOLAMINE 1 MG/3DAYS TD PT72
1.0000 | MEDICATED_PATCH | TRANSDERMAL | Status: DC
  Administered 2023-01-30: 1.5 mg via TRANSDERMAL

## 2023-01-30 SURGICAL SUPPLY — 38 items
ADH SKN CLS APL DERMABOND .7 (GAUZE/BANDAGES/DRESSINGS) ×1
AGENT HMST KT MTR STRL THRMB (HEMOSTASIS)
BLADE CLIPPER SENSICLIP SURGIC (BLADE) ×1 IMPLANT
BLADE SURG 15 STRL LF DISP TIS (BLADE) ×1 IMPLANT
BLADE SURG 15 STRL SS (BLADE) ×1
CATH SILICONE 14FRX5CC (CATHETERS) IMPLANT
DERMABOND ADVANCED .7 DNX12 (GAUZE/BANDAGES/DRESSINGS) ×1 IMPLANT
ELECT REM PT RETURN 9FT ADLT (ELECTROSURGICAL)
ELECTRODE REM PT RTRN 9FT ADLT (ELECTROSURGICAL) IMPLANT
GAUZE 4X4 16PLY ~~LOC~~+RFID DBL (SPONGE) IMPLANT
GLOVE BIOGEL PI IND STRL 6.5 (GLOVE) ×1 IMPLANT
GLOVE ECLIPSE 6.0 STRL STRAW (GLOVE) ×1 IMPLANT
GOWN STRL REUS W/TWL LRG LVL3 (GOWN DISPOSABLE) ×1 IMPLANT
HIBICLENS CHG 4% 4OZ BTL (MISCELLANEOUS) ×1 IMPLANT
HOLDER FOLEY CATH W/STRAP (MISCELLANEOUS) ×1 IMPLANT
KIT TURNOVER CYSTO (KITS) ×1 IMPLANT
MANIFOLD NEPTUNE II (INSTRUMENTS) ×1 IMPLANT
NDL HYPO 22X1.5 SAFETY MO (MISCELLANEOUS) ×1 IMPLANT
NEEDLE HYPO 22X1.5 SAFETY MO (MISCELLANEOUS) ×1 IMPLANT
NS IRRIG 1000ML POUR BTL (IV SOLUTION) ×1 IMPLANT
PACK CYSTO (CUSTOM PROCEDURE TRAY) ×1 IMPLANT
PACK VAGINAL WOMENS (CUSTOM PROCEDURE TRAY) ×1 IMPLANT
RETRACTOR LONE STAR DISPOSABLE (INSTRUMENTS) ×1 IMPLANT
RETRACTOR STAY HOOK 5MM (MISCELLANEOUS) ×1 IMPLANT
SET IRRIG Y TYPE TUR BLADDER L (SET/KITS/TRAYS/PACK) ×1 IMPLANT
SLEEVE SCD COMPRESS KNEE MED (STOCKING) ×1 IMPLANT
SPIKE FLUID TRANSFER (MISCELLANEOUS) ×1 IMPLANT
SUCTION FRAZIER HANDLE 10FR (MISCELLANEOUS) ×1
SUCTION TUBE FRAZIER 10FR DISP (MISCELLANEOUS) ×1 IMPLANT
SURGIFLO W/THROMBIN 8M KIT (HEMOSTASIS) IMPLANT
SUT ABS MONO DBL WITH NDL 48IN (SUTURE) IMPLANT
SUT VIC AB 2-0 SH 27 (SUTURE) ×1
SUT VIC AB 2-0 SH 27XBRD (SUTURE) ×1 IMPLANT
SYR BULB EAR ULCER 3OZ GRN STR (SYRINGE) ×1 IMPLANT
SYS SLING ADV FIT BLUE TRNSVAG (Sling) IMPLANT
TOWEL OR 17X24 6PK STRL BLUE (TOWEL DISPOSABLE) ×1 IMPLANT
TRAY FOLEY W/BAG SLVR 14FR LF (SET/KITS/TRAYS/PACK) ×1 IMPLANT
WATER STERILE IRR 500ML POUR (IV SOLUTION) IMPLANT

## 2023-01-30 NOTE — OR Nursing (Signed)
Late entry. Patient voided greater than 100cc yellow urine with small blood. Assisted back to stretcher.  Patient states she needed to void again.  Voided 75cc clear yellow urine. Bladder scanned for 0cc. Rescanned for 0cc. Dr. Wannetta Sender notified and states patient may be discharged to home without a cathetor. Patient voids a third time- voided in restroom and flushed without measuring. Darin Engels rn

## 2023-01-30 NOTE — Op Note (Signed)
Operative Note  Preoperative Diagnosis: stress urinary incontinence  Postoperative Diagnosis: same  Procedures performed:  Midurethral sling (Advantage Fit), cystoscopy  Implants:  Implant Name Type Inv. Item Serial No. Manufacturer Lot No. LRB No. Used Action  SYS SLING ADV FIT BLUE TRNSVAG - [id number] Sling SYS SLING ADV FIT Dawson [id number] N/A 1 Implanted    Attending Surgeon: Sherlene Shams, MD  Assistant Surgeon: Tommie Sams, MD PGY4  Anesthesia: General LMA  Findings: On cystoscopy at the end of the case, normal bladder and urethra without injury, lesion or foreign body. Brisk bilateral ureteral efflux noted.    Specimens: none  Estimated blood loss: 30 mL  IV fluids: 600 mL  Urine output: 25 mL  Complications: none  Procedure in Detail:  After informed consent was obtained, the patient was taken to the operating room where she was placed under anesthesia.  She was then placed in the dorsal lithotomy position with allen stirrups,  and prepped and draped in the usual sterile fashion.  A strap was placed across her upper abdomen on top of her gown so it was not in direct contact with her skin.  Care was taken to avoid hyperflexion or hyperextension of her upper and lower extremities. A foley catheter was placed.  A lonestar self-retraining retractor was placed with 4 stay hooks. The mid urethral area was located on the anterior vaginal wall.  Two Allis clamps were placed at the level of the midurethra. 1% lidocaine with epinephrine was injected into the vaginal mucosa. A vertical incision was made between the two clamps using a 15-blade scalpel.  Using sharp dissection, Metzenbaum scissors were used to make a periurethral tunnel from the vaginal incision towards the pubic rami bilaterally for the future sling tracts. The bladder was ensured to be empty. The trocar and attached sling were introduced into the right side of the periurethral vaginal  incision, just inferior to the pubic symphysis on the right side. The trocar was guided through the endopelvic fascia and directly vertically.  While hugging the cephalad surface of the pubic bone, the trocar was guided out through the abdomen 2 fingerbreadths lateral to midline at the level of the pubic symphysis on the ipsilateral side. The trocar was placed on the left side in a similar fashion.  A 70-degree cystoscope was introduced, and 360-degree inspection revealed no trauma or trocars in the bladder. However, the left side of the sling appeared very superficial under the mucosa. The cystoscope was removed and the catheter was replaced. The left side trocar was removed and replaced, ensuring that it was hugging the pubic bone. Cystoscopy was again performed and both trocars were noted to be well placed. Brisk bilateral ureteral efflux noted. The bladder was drained and the cystoscope was removed.  The Foley catheter was reinserted.  The sling was brought to lie beneath the mid-urethra.  A needle driver was placed behind the sling to ensure no tension.   The plastic sheath was removed from the sling and the distal ends of the sling were trimmed just below the level of the skin incisions.  Tension-free positioning of the sling was confirmed. Vaginal inspection revealed no vaginotomy or sling perforations of the mucosa.  The vaginal mucosal edges were reapproximated using 2-0 Vicryl in a running fashion.  The vagina was copiously irrigated.  Hemostasis was again noted. Vaginal packing not placed. The suprapubic sling incisions were closed with Dermabond. The patient tolerated the procedure well.  She was awakened from  anesthesia and transferred to the recovery room in stable condition. All needle and sponge counts were correct x 2.    [name], MD

## 2023-01-30 NOTE — Discharge Instructions (Addendum)
POST OPERATIVE INSTRUCTIONS  General Instructions Recovery (not bed rest) will last approximately 6 weeks Walking is encouraged, but refrain from strenuous exercise/ housework/ heavy lifting for 2 weeks. No lifting >10lbs  Nothing in the vagina- NO intercourse, tampons or douching for 6 weeks Bathing:  Do not submerge in water (NO swimming, bath, hot tub, etc) until after your postop visit. You can shower starting the day after surgery.  No driving until you are not taking narcotic pain medicine and until your pain is well enough controlled that you can slam on the breaks or make sudden movements if needed.   Taking your medications Please take your acetaminophen and ibuprofen on a schedule for the first 48 hours. Take '600mg'$  ibuprofen, then take '500mg'$  acetaminophen 3 hours later, then continue to alternate ibuprofen and acetaminophen. That way you are taking each type of medication every 6 hours. Take the prescribed narcotic (oxycodone, tramadol, etc) as needed, with a maximum being every 4 hours.  Take a stool softener daily to keep your stools soft and preventing you from straining. If you have diarrhea, you decrease your stool softener. This is explained more below. We have prescribed you Miralax.  Reasons to Call the Nurse (see last page for phone numbers) Heavy Bleeding (changing your pad every 1-2 hours) Persistent nausea/vomiting Fever (100.4 degrees or more) Incision problems (pus or other fluid coming out, redness, warmth, increased pain)  Things to Expect After Surgery Mild to Moderate pain is normal during the first day or two after surgery. If prescribed, take Ibuprofen or Tylenol first and use the stronger medicine for "break-through" pain. You can overlap these medicines because they work differently.   Constipation   To Prevent Constipation:  Eat a well-balanced diet including protein, grains, fresh fruit and vegetables.  Drink plenty of fluids. Walk regularly.  Depending on  specific instructions from your physician: take Miralax daily and additionally you can add a stool softener (colace/ docusate) and fiber supplement. Continue as long as you're on pain medications.   To Treat Constipation:  If you do not have a bowel movement in 2 days after surgery, you can take 2 Tbs of Milk of Magnesia 1-2 times a day until you have a bowel movement. If diarrhea occurs, decrease the amount or stop the laxative. If no results with Milk of Magnesia, you can drink a bottle of magnesium citrate which you can purchase over the counter.  Fatigue:  This is a normal response to surgery and will improve with time.  Plan frequent rest periods throughout the day.  Gas Pain:  This is very common but can also be very painful! Drink warm liquids such as herbal teas, bouillon or soup. Walking will help you pass more gas.  Mylicon or Gas-X can be taken over the counter.  Leaking Urine:  Varying amounts of leakage may occur after surgery.  This should improve with time. Your bladder needs at least 3 months to recover from surgery. If you leak after surgery, be sure to mention this to your doctor at your post-op visit. If you were taking medications for overactive bladder prior to surgery, be sure to restart the medications immediately after surgery.  Incisions: If you have incisions on your abdomen, the skin glue will dissolve on its own over time. It is ok to gently rinse with soap and water over these incisions but do not scrub.  Catheter Approximately 50% of patients are unable to urinate after surgery and need to go home with a  catheter. This allows your bladder to rest so it can return to full function. If you go home with a catheter, the office will call to set up a voiding trial a few days after surgery. For most patients, by this visit, they are able to urinate on their own. Long term catheter use is rare.   Return to Work  As work demands and recovery times vary widely, it is hard to  predict when you will want to return to work. If you have a desk job with no strenuous physical activity, and if you would like to return sooner than generally recommended, discuss this with your provider or call our office.   Post op concerns  For non-emergent issues, please call the Urogynecology Nurse. Please leave a message and someone will contact you within one business day.  You can also send a message through [place].   AFTER HOURS (After 5:00 PM and on weekends):  For urgent matters that cannot wait until the next business day. Call our office [phone] and connect to the doctor on call.  Please reserve this for important issues.   **FOR ANY TRUE EMERGENCY ISSUES CALL 911 OR GO TO THE NEAREST EMERGENCY ROOM.** Please inform our office or the doctor on call of any emergency.     APPOINTMENTS: Call [phone]   Post Anesthesia Home Care Instructions  Activity: Get plenty of rest for the remainder of the day. A responsible individual must stay with you for 24 hours following the procedure.  For the next 24 hours, DO NOT: -Drive a car -Paediatric nurse -Drink alcoholic beverages -Take any medication unless instructed by your physician -Make any legal decisions or sign important papers.  Meals: Start with liquid foods such as gelatin or soup. Progress to regular foods as tolerated. Avoid greasy, spicy, heavy foods. If nausea and/or vomiting occur, drink only clear liquids until the nausea and/or vomiting subsides. Call your physician if vomiting continues.  Special Instructions/Symptoms: Your throat may feel dry or sore from the anesthesia or the breathing tube placed in your throat during surgery. If this causes discomfort, gargle with warm salt water. The discomfort should disappear within 24 hours.  If you had a scopolamine patch placed behind your ear for the management of post- operative nausea and/or vomiting:  1. The medication in the patch is effective for 72  hours, after which it should be removed.  Wrap patch in a tissue and discard in the trash. Wash hands thoroughly with soap and water. 2. You may remove the patch earlier than 72 hours if you experience unpleasant side effects which may include dry mouth, dizziness or visual disturbances. 3. Avoid touching the patch. Wash your hands with soap and water after contact with the patch.    No acetaminophen/Tylenol until after 4:30 pm today if needed.

## 2023-01-30 NOTE — Anesthesia Postprocedure Evaluation (Signed)
Anesthesia Post Note  Patient: [name]  Procedure(s) Performed: TRANSVAGINAL TAPE (TVT) PROCEDURE CYSTOSCOPY     Patient location during evaluation: PACU Anesthesia Type: General Level of consciousness: awake and alert, patient cooperative and oriented Pain management: pain level controlled Vital Signs Assessment: post-procedure vital signs reviewed and stable Respiratory status: spontaneous breathing, nonlabored ventilation and respiratory function stable Cardiovascular status: blood pressure returned to baseline and stable Postop Assessment: no apparent nausea or vomiting, able to ambulate and adequate PO intake Anesthetic complications: no   No notable events documented.  Last Vitals:  Vitals:   01/30/23 1245 01/30/23 1300  BP: 126/64 113/65  Pulse: 68 65  Resp: 14 12  Temp:    SpO2: 95% 97%    Last Pain:  Vitals:   01/30/23 1300  TempSrc:   PainSc: 0-No pain                 Weslie Pretlow,E. Maryelizabeth Eberle

## 2023-01-30 NOTE — Transfer of Care (Signed)
Immediate Anesthesia Transfer of Care Note  Patient: [name]  Procedure(s) Performed: TRANSVAGINAL TAPE (TVT) PROCEDURE CYSTOSCOPY  Patient Location: PACU  Anesthesia Type:General  Level of Consciousness: drowsy, patient cooperative, and responds to stimulation  Airway & Oxygen Therapy: Patient Spontanous Breathing  Post-op Assessment: Report given to RN and Post -op Vital signs reviewed and stable  Post vital signs: Reviewed and stable  Last Vitals:  Vitals Value Taken Time  BP 145/72 01/30/23 1221  Temp 36.6 C 01/30/23 1221  Pulse 86 01/30/23 1224  Resp 15 01/30/23 1224  SpO2 92 % 01/30/23 1224  Vitals shown include unvalidated device data.  Last Pain:  Vitals:   01/30/23 1033  TempSrc: Oral  PainSc: 0-No pain      Patients Stated Pain Goal: 4 ([id number])  Complications: No notable events documented.

## 2023-01-30 NOTE — Anesthesia Procedure Notes (Signed)
Procedure Name: LMA Insertion Date/Time: 01/30/2023 11:20 AM  Performed by: Clearnce Sorrel, CRNAPre-anesthesia Checklist: Patient identified, Emergency Drugs available, Suction available and Patient being monitored Patient Re-evaluated:Patient Re-evaluated prior to induction Oxygen Delivery Method: Circle System Utilized Preoxygenation: Pre-oxygenation with 100% oxygen Induction Type: IV induction Ventilation: Mask ventilation without difficulty LMA: LMA inserted LMA Size: 4.0 Number of attempts: 1 Placement Confirmation: positive ETCO2 Tube secured with: Tape Dental Injury: Teeth and Oropharynx as per pre-operative assessment

## 2023-01-30 NOTE — Progress Notes (Signed)
Dr. Glennon Mac aware of K 2.7. No new orders at this time.

## 2023-01-30 NOTE — Anesthesia Preprocedure Evaluation (Addendum)
Anesthesia Evaluation  Patient identified by MRN, date of birth, ID band Patient awake    Reviewed: Allergy & Precautions, NPO status , Patient's Chart, lab work & pertinent test results  History of Anesthesia Complications (+) PONV and POST - OP SPINAL HEADACHE  Airway Mallampati: II  TM Distance: >3 FB Neck ROM: Full    Dental  (+) Caps, Dental Advisory Given, Chipped   Pulmonary former smoker   breath sounds clear to auscultation       Cardiovascular hypertension, Pt. on medications (-) angina  Rhythm:Regular Rate:Normal     Neuro/Psych  Headaches  Anxiety Depression    DDD: chronic back pain    GI/Hepatic Neg liver ROS,GERD  Medicated and Controlled,,  Endo/Other  diabetes, Insulin Dependent  Ozempic: last dose 10d ago  Renal/GU negative Renal ROS     Musculoskeletal   Abdominal   Peds  Hematology   Anesthesia Other Findings   Reproductive/Obstetrics                             Anesthesia Physical Anesthesia Plan  ASA: 3  Anesthesia Plan: General   Post-op Pain Management: Tylenol PO (pre-op)*   Induction: Intravenous  PONV Risk Score and Plan: 4 or greater and Scopolamine patch - Pre-op, Dexamethasone, Ondansetron and TIVA  Airway Management Planned: LMA  Additional Equipment: None  Intra-op Plan:   Post-operative Plan:   Informed Consent: I have reviewed the patients History and Physical, chart, labs and discussed the procedure including the risks, benefits and alternatives for the proposed anesthesia with the patient or authorized representative who has indicated his/her understanding and acceptance.     Dental advisory given  Plan Discussed with: CRNA and Surgeon  Anesthesia Plan Comments:        Anesthesia Quick Evaluation

## 2023-01-30 NOTE — Interval H&P Note (Signed)
History and Physical Interval Note:  01/30/2023 10:55 AM  [name]  has presented today for surgery, with the diagnosis of stress urinary incontinence;.  The various methods of treatment have been discussed with the patient and family. After consideration of risks, benefits and other options for treatment, the patient has consented to  Procedure(s) with comments: TRANSVAGINAL TAPE (TVT) PROCEDURE (N/A) CYSTOSCOPY (N/A) as a surgical intervention.  The patient's history has been reviewed, patient examined, no change in status, stable for surgery.  I have reviewed the patient's chart and labs.  Questions were answered to the patient's satisfaction.     [name]

## 2023-01-30 NOTE — Telephone Encounter (Signed)
[name] underwent midurethral sling and cystoscopy on 01/30/23.   She passed her voiding trial.  171ml was backfilled into the bladder (unable to fill more due to patient discomfort) Voided 152ml  PVR by bladder scan was 81ml.   She was discharged without a catheter. Please call her for a routine post op check. Thanks!  [name], MD

## 2023-01-31 ENCOUNTER — Encounter (HOSPITAL_BASED_OUTPATIENT_CLINIC_OR_DEPARTMENT_OTHER): Payer: Self-pay | Admitting: Obstetrics and Gynecology

## 2023-01-31 NOTE — Telephone Encounter (Signed)
Called and spoke with patient:   Patient reports pain 7/10 and has been up and walking around Patient reports she has been able to urinate normally but has had increased urgency. Small amount of bleeding but not much.  Patient reports she is taking miralax but has not yet had a bowel movement. Is aware if she has not had one by tomorrow afternoon to add in [place].   Overall patient reports she is doing well and will call if she has any concerns.

## 2023-02-28 ENCOUNTER — Encounter: Payer: Medicare Other | Admitting: Obstetrics and Gynecology

## 2023-03-02 ENCOUNTER — Encounter: Payer: Self-pay | Admitting: Obstetrics and Gynecology

## 2023-03-02 ENCOUNTER — Ambulatory Visit (INDEPENDENT_AMBULATORY_CARE_PROVIDER_SITE_OTHER): Payer: Medicare Other | Admitting: Obstetrics and Gynecology

## 2023-03-02 VITALS — BP 122/80 | HR 85

## 2023-03-02 DIAGNOSIS — N3941 Urge incontinence: Secondary | ICD-10-CM

## 2023-03-02 DIAGNOSIS — Z9889 Other specified postprocedural states: Secondary | ICD-10-CM

## 2023-03-02 MED ORDER — TROSPIUM CHLORIDE 20 MG PO TABS
20.0000 mg | ORAL_TABLET | Freq: Two times a day (BID) | ORAL | 5 refills | Status: DC
Start: 2023-03-02 — End: 2023-09-19

## 2023-03-02 NOTE — Progress Notes (Signed)
[facility]  Date of Visit: 03/02/2023  History of Present Illness: [name] is a 62 y.o. female scheduled today for a post-operative visit.   Surgery: s/p Midurethral sling (Advantage Fit), cystoscopy on 01/30/23. Also had urethral diverticulectomy, cystoscopy, repair of bladder injury on 10/27/22   She passed her postoperative void trial.   Postoperative course has been uncomplicated.   Today she reports she is very happy with the procedure. She is no longer leaking on a regular basis from SUI. Has a history of IC and bladder urgency and this has been stable but still present and bothersome. She is waking up multiple times per night and does not feel she gets good sleep. Did try vesicare 10mg  a few months ago but did not make much of a difference and no longer taking it. Has also tried oxybutynin in the past and reports she had side effects.    UTI symptoms? No  Pain? No  She has returned to her normal activity (except for postop restrictions) Vaginal bulge? No  Stress incontinence: No  Urgency/frequency: Yes  Urge incontinence: Yes  Voiding dysfunction: No  Bowel issues: No    Medications: She has a current medication list which includes the following prescription(s): trospium, acetaminophen, amlodipine, cyclobenzaprine, duloxetine, elderberry, ezetimibe, fluoxetine, hydrochlorothiazide, hydrocodone-acetaminophen, ibuprofen, ibuprofen, insulin lispro, ozempic (1 mg/dose), pantoprazole, potassium chloride, and tresiba flextouch.   Allergies: Patient is allergic to latex, penicillins, erythromycin, ofloxacin, sulfonamide derivatives, azithromycin, fenofibrate, and statins.   Physical Exam: BP 122/80   Pulse 85    Suprapubic Incisions: healing well.  Pelvic Examination: Vagina: Incisions healing well. Sutures are present at incision line and there is not granulation tissue. No tenderness. No visible or palpable mesh. - No prolapse noted.    ---------------------------------------------------------  Assessment and Plan:  1. Post-operative state   2. Urge incontinence    - Healing well. Has had good result from the sling.  - Can resume regular activity including exercise and swimming but wait an additional two weeks for intercourse.  - For urgency symptoms, will start Trospium 20mg  BID. We also reviewed third line therapy options of PTNS, botox and Sacral nerve stimulation. She is interested in PTNS or botox and handouts provided. She will follow up with me in 3 months. We discussed that if medication is not working well or she has side effects before that time then she can meet with the nurse practitioner to review alternative options.   Return 3 months or sooner if needed  Marguerita Beards, MD

## 2023-04-18 ENCOUNTER — Inpatient Hospital Stay (HOSPITAL_BASED_OUTPATIENT_CLINIC_OR_DEPARTMENT_OTHER)
Admission: EM | Admit: 2023-04-18 | Discharge: 2023-04-20 | DRG: 287 | Disposition: A | Discharge status: Home / Self Care | Payer: Medicare Other | Attending: Internal Medicine | Admitting: Internal Medicine

## 2023-04-18 ENCOUNTER — Encounter (HOSPITAL_BASED_OUTPATIENT_CLINIC_OR_DEPARTMENT_OTHER): Payer: Self-pay | Admitting: Emergency Medicine

## 2023-04-18 ENCOUNTER — Other Ambulatory Visit (HOSPITAL_BASED_OUTPATIENT_CLINIC_OR_DEPARTMENT_OTHER): Payer: Self-pay

## 2023-04-18 ENCOUNTER — Emergency Department (HOSPITAL_BASED_OUTPATIENT_CLINIC_OR_DEPARTMENT_OTHER): Payer: Medicare Other

## 2023-04-18 ENCOUNTER — Other Ambulatory Visit: Payer: Self-pay

## 2023-04-18 DIAGNOSIS — R079 Chest pain, unspecified: Secondary | ICD-10-CM | POA: Diagnosis present

## 2023-04-18 DIAGNOSIS — F17200 Nicotine dependence, unspecified, uncomplicated: Secondary | ICD-10-CM | POA: Diagnosis present

## 2023-04-18 DIAGNOSIS — Z6830 Body mass index (BMI) 30.0-30.9, adult: Secondary | ICD-10-CM | POA: Diagnosis not present

## 2023-04-18 DIAGNOSIS — Z85828 Personal history of other malignant neoplasm of skin: Secondary | ICD-10-CM | POA: Diagnosis not present

## 2023-04-18 DIAGNOSIS — Z881 Allergy status to other antibiotic agents status: Secondary | ICD-10-CM

## 2023-04-18 DIAGNOSIS — F17210 Nicotine dependence, cigarettes, uncomplicated: Secondary | ICD-10-CM | POA: Diagnosis present

## 2023-04-18 DIAGNOSIS — Z981 Arthrodesis status: Secondary | ICD-10-CM | POA: Diagnosis not present

## 2023-04-18 DIAGNOSIS — Z841 Family history of disorders of kidney and ureter: Secondary | ICD-10-CM

## 2023-04-18 DIAGNOSIS — Z794 Long term (current) use of insulin: Secondary | ICD-10-CM | POA: Diagnosis not present

## 2023-04-18 DIAGNOSIS — I209 Angina pectoris, unspecified: Secondary | ICD-10-CM | POA: Diagnosis not present

## 2023-04-18 DIAGNOSIS — E785 Hyperlipidemia, unspecified: Secondary | ICD-10-CM | POA: Diagnosis not present

## 2023-04-18 DIAGNOSIS — K219 Gastro-esophageal reflux disease without esophagitis: Secondary | ICD-10-CM | POA: Diagnosis present

## 2023-04-18 DIAGNOSIS — E88819 Insulin resistance, unspecified: Secondary | ICD-10-CM | POA: Diagnosis not present

## 2023-04-18 DIAGNOSIS — E119 Type 2 diabetes mellitus without complications: Secondary | ICD-10-CM | POA: Diagnosis not present

## 2023-04-18 DIAGNOSIS — E669 Obesity, unspecified: Secondary | ICD-10-CM | POA: Diagnosis present

## 2023-04-18 DIAGNOSIS — Z9049 Acquired absence of other specified parts of digestive tract: Secondary | ICD-10-CM

## 2023-04-18 DIAGNOSIS — Z79899 Other long term (current) drug therapy: Secondary | ICD-10-CM

## 2023-04-18 DIAGNOSIS — E876 Hypokalemia: Secondary | ICD-10-CM | POA: Diagnosis present

## 2023-04-18 DIAGNOSIS — Z7985 Long-term (current) use of injectable non-insulin antidiabetic drugs: Secondary | ICD-10-CM | POA: Diagnosis not present

## 2023-04-18 DIAGNOSIS — Z825 Family history of asthma and other chronic lower respiratory diseases: Secondary | ICD-10-CM

## 2023-04-18 DIAGNOSIS — I25119 Atherosclerotic heart disease of native coronary artery with unspecified angina pectoris: Secondary | ICD-10-CM | POA: Diagnosis not present

## 2023-04-18 DIAGNOSIS — I2510 Atherosclerotic heart disease of native coronary artery without angina pectoris: Secondary | ICD-10-CM | POA: Insufficient documentation

## 2023-04-18 DIAGNOSIS — K2270 Barrett's esophagus without dysplasia: Secondary | ICD-10-CM | POA: Diagnosis not present

## 2023-04-18 DIAGNOSIS — R031 Nonspecific low blood-pressure reading: Secondary | ICD-10-CM | POA: Diagnosis not present

## 2023-04-18 DIAGNOSIS — Z8249 Family history of ischemic heart disease and other diseases of the circulatory system: Secondary | ICD-10-CM | POA: Diagnosis not present

## 2023-04-18 DIAGNOSIS — Z880 Allergy status to penicillin: Secondary | ICD-10-CM

## 2023-04-18 DIAGNOSIS — Z91040 Latex allergy status: Secondary | ICD-10-CM

## 2023-04-18 DIAGNOSIS — I2081 Angina pectoris with coronary microvascular dysfunction: Secondary | ICD-10-CM

## 2023-04-18 DIAGNOSIS — Z888 Allergy status to other drugs, medicaments and biological substances status: Secondary | ICD-10-CM

## 2023-04-18 DIAGNOSIS — Z96651 Presence of right artificial knee joint: Secondary | ICD-10-CM | POA: Diagnosis not present

## 2023-04-18 DIAGNOSIS — Z87442 Personal history of urinary calculi: Secondary | ICD-10-CM

## 2023-04-18 DIAGNOSIS — Z7982 Long term (current) use of aspirin: Secondary | ICD-10-CM

## 2023-04-18 DIAGNOSIS — F32A Depression, unspecified: Secondary | ICD-10-CM | POA: Diagnosis not present

## 2023-04-18 DIAGNOSIS — Z83719 Family history of colon polyps, unspecified: Secondary | ICD-10-CM

## 2023-04-18 DIAGNOSIS — T502X5A Adverse effect of carbonic-anhydrase inhibitors, benzothiadiazides and other diuretics, initial encounter: Secondary | ICD-10-CM | POA: Diagnosis not present

## 2023-04-18 DIAGNOSIS — Z882 Allergy status to sulfonamides status: Secondary | ICD-10-CM

## 2023-04-18 DIAGNOSIS — Z90710 Acquired absence of both cervix and uterus: Secondary | ICD-10-CM

## 2023-04-18 DIAGNOSIS — I10 Essential (primary) hypertension: Secondary | ICD-10-CM | POA: Diagnosis present

## 2023-04-18 DIAGNOSIS — F419 Anxiety disorder, unspecified: Secondary | ICD-10-CM | POA: Diagnosis present

## 2023-04-18 DIAGNOSIS — Z716 Tobacco abuse counseling: Secondary | ICD-10-CM

## 2023-04-18 DIAGNOSIS — Z87892 Personal history of anaphylaxis: Secondary | ICD-10-CM

## 2023-04-18 LAB — TSH: TSH: 1.275 u[IU]/mL (ref 0.350–4.500)

## 2023-04-18 LAB — CBC
HCT: 37 % (ref 36.0–46.0)
Hemoglobin: 13 g/dL (ref 12.0–15.0)
MCH: 30.7 pg (ref 26.0–34.0)
MCHC: 35.1 g/dL (ref 30.0–36.0)
MCV: 87.5 fL (ref 80.0–100.0)
Platelets: 357 10*3/uL (ref 150–400)
RBC: 4.23 MIL/uL (ref 3.87–5.11)
RDW: 12.7 % (ref 11.5–15.5)
WBC: 8.7 10*3/uL (ref 4.0–10.5)
nRBC: 0 % (ref 0.0–0.2)

## 2023-04-18 LAB — COMPREHENSIVE METABOLIC PANEL
ALT: 12 U/L (ref 0–44)
AST: 17 U/L (ref 15–41)
Albumin: 4.1 g/dL (ref 3.5–5.0)
Alkaline Phosphatase: 71 U/L (ref 38–126)
Anion gap: 11 (ref 5–15)
BUN: 14 mg/dL (ref 8–23)
CO2: 31 mmol/L (ref 22–32)
Calcium: 9.3 mg/dL (ref 8.9–10.3)
Chloride: 98 mmol/L (ref 98–111)
Creatinine, Ser: 0.9 mg/dL (ref 0.44–1.00)
GFR, Estimated: >60 mL/min (ref >60)
Glucose, Bld: 128 mg/dL — ABNORMAL HIGH (ref 70–99)
Potassium: 2.9 mmol/L — ABNORMAL LOW (ref 3.5–5.1)
Sodium: 140 mmol/L (ref 135–145)
Total Bilirubin: 0.5 mg/dL (ref 0.3–1.2)
Total Protein: 6.8 g/dL (ref 6.5–8.1)

## 2023-04-18 LAB — CBG MONITORING, ED: Glucose-Capillary: 99 mg/dL (ref 70–99)

## 2023-04-18 LAB — GLUCOSE, CAPILLARY
Glucose-Capillary: 96 mg/dL (ref 70–99)
Glucose-Capillary: 129 mg/dL — ABNORMAL HIGH (ref 70–99)

## 2023-04-18 LAB — MRSA NEXT GEN BY PCR, NASAL: MRSA by PCR Next Gen: NOT DETECTED

## 2023-04-18 LAB — D-DIMER, QUANTITATIVE: D-Dimer, Quant: <0.27 ug/mL-FEU (ref 0.00–0.50)

## 2023-04-18 LAB — HEMOGLOBIN A1C
Hgb A1c MFr Bld: 5.7 % — ABNORMAL HIGH (ref 4.8–5.6)
Mean Plasma Glucose: 116.89 mg/dL

## 2023-04-18 LAB — RAPID URINE DRUG SCREEN, HOSP PERFORMED
Amphetamines: NOT DETECTED
Barbiturates: NOT DETECTED
Benzodiazepines: POSITIVE — AB
Cocaine: NOT DETECTED
Opiates: POSITIVE — AB
Tetrahydrocannabinol: NOT DETECTED

## 2023-04-18 LAB — TROPONIN I (HIGH SENSITIVITY)
Troponin I (High Sensitivity): 3 ng/L (ref <18)
Troponin I (High Sensitivity): 3 ng/L (ref <18)

## 2023-04-18 LAB — MAGNESIUM: Magnesium: 2.1 mg/dL (ref 1.7–2.4)

## 2023-04-18 MED ORDER — EZETIMIBE 10 MG PO TABS
10.0000 mg | ORAL_TABLET | Freq: Every day | ORAL | Status: DC
  Administered 2023-04-18 – 2023-04-20 (×3): 10 mg via ORAL
  Filled 2023-04-18 (×3): qty 1

## 2023-04-18 MED ORDER — CYCLOBENZAPRINE HCL 10 MG PO TABS: 10.0000 mg | ORAL_TABLET | Freq: Three times a day (TID) | ORAL | Status: DC | PRN

## 2023-04-18 MED ORDER — FLUOXETINE HCL 20 MG PO CAPS
40.0000 mg | ORAL_CAPSULE | Freq: Every day | ORAL | Status: DC
  Administered 2023-04-18 – 2023-04-20 (×3): 40 mg via ORAL
  Filled 2023-04-18 (×3): qty 2

## 2023-04-18 MED ORDER — AMLODIPINE BESYLATE 5 MG PO TABS
5.0000 mg | ORAL_TABLET | Freq: Every day | ORAL | Status: DC
  Administered 2023-04-18 – 2023-04-19 (×2): 5 mg via ORAL
  Filled 2023-04-18 (×3): qty 1

## 2023-04-18 MED ORDER — ONDANSETRON HCL 4 MG/2ML IJ SOLN
4.0000 mg | Freq: Once | INTRAMUSCULAR | Status: AC
  Administered 2023-04-18: 4 mg via INTRAVENOUS
  Filled 2023-04-18: qty 2

## 2023-04-18 MED ORDER — ASPIRIN 81 MG PO TBEC
81.0000 mg | DELAYED_RELEASE_TABLET | Freq: Every day | ORAL | Status: DC
  Administered 2023-04-19 – 2023-04-20 (×2): 81 mg via ORAL
  Filled 2023-04-18 (×2): qty 1

## 2023-04-18 MED ORDER — ASPIRIN 325 MG PO TBEC
325.0000 mg | DELAYED_RELEASE_TABLET | Freq: Once | ORAL | Status: AC
  Administered 2023-04-18: 325 mg via ORAL
  Filled 2023-04-18: qty 1

## 2023-04-18 MED ORDER — ACETAMINOPHEN 500 MG PO TABS: 500.0000 mg | ORAL_TABLET | Freq: Four times a day (QID) | ORAL | Status: DC | PRN

## 2023-04-18 MED ORDER — NICOTINE 14 MG/24HR TD PT24: 14.0000 mg | MEDICATED_PATCH | Freq: Every day | TRANSDERMAL | Status: DC

## 2023-04-18 MED ORDER — MORPHINE SULFATE (PF) 4 MG/ML IV SOLN
4.0000 mg | Freq: Once | INTRAVENOUS | Status: AC
  Administered 2023-04-18: 4 mg via INTRAVENOUS
  Filled 2023-04-18: qty 1

## 2023-04-18 MED ORDER — HYDROXYZINE HCL 25 MG PO TABS
25.0000 mg | ORAL_TABLET | Freq: Once | ORAL | Status: AC
  Administered 2023-04-18: 25 mg via ORAL
  Filled 2023-04-18: qty 1

## 2023-04-18 MED ORDER — FESOTERODINE FUMARATE ER 4 MG PO TB24
4.0000 mg | ORAL_TABLET | Freq: Every day | ORAL | Status: DC
  Administered 2023-04-19: 4 mg via ORAL
  Filled 2023-04-18 (×3): qty 1

## 2023-04-18 MED ORDER — ONDANSETRON HCL 4 MG/2ML IJ SOLN: 4.0000 mg | Freq: Four times a day (QID) | INTRAMUSCULAR | Status: DC | PRN

## 2023-04-18 MED ORDER — HYDROCODONE-ACETAMINOPHEN 5-325 MG PO TABS
1.0000 | ORAL_TABLET | ORAL | Status: DC | PRN
  Administered 2023-04-18 – 2023-04-20 (×4): 1 via ORAL
  Filled 2023-04-18 (×4): qty 1

## 2023-04-18 MED ORDER — POTASSIUM CHLORIDE CRYS ER 20 MEQ PO TBCR
40.0000 meq | EXTENDED_RELEASE_TABLET | Freq: Once | ORAL | Status: AC
  Administered 2023-04-18: 40 meq via ORAL
  Filled 2023-04-18: qty 2

## 2023-04-18 MED ORDER — ACETAMINOPHEN 325 MG PO TABS: 650.0000 mg | ORAL_TABLET | ORAL | Status: DC | PRN

## 2023-04-18 MED ORDER — PANTOPRAZOLE SODIUM 40 MG PO TBEC
40.0000 mg | DELAYED_RELEASE_TABLET | Freq: Two times a day (BID) | ORAL | Status: DC
  Administered 2023-04-18 – 2023-04-20 (×4): 40 mg via ORAL
  Filled 2023-04-18 (×4): qty 1

## 2023-04-18 MED ORDER — NITROGLYCERIN 0.4 MG SL SUBL
0.4000 mg | SUBLINGUAL_TABLET | SUBLINGUAL | Status: DC | PRN
  Administered 2023-04-18 – 2023-04-19 (×7): 0.4 mg via SUBLINGUAL
  Filled 2023-04-18 (×4): qty 1

## 2023-04-18 MED ORDER — DULOXETINE HCL 20 MG PO CPEP
20.0000 mg | ORAL_CAPSULE | Freq: Every day | ORAL | Status: DC
  Administered 2023-04-18 – 2023-04-20 (×3): 20 mg via ORAL
  Filled 2023-04-18 (×3): qty 1

## 2023-04-18 MED ORDER — ORAL CARE MOUTH RINSE: 15.0000 mL | OROMUCOSAL | Status: DC | PRN

## 2023-04-18 MED ORDER — ACETAMINOPHEN 325 MG PO TABS
650.0000 mg | ORAL_TABLET | Freq: Once | ORAL | Status: AC
  Administered 2023-04-18: 650 mg via ORAL
  Filled 2023-04-18: qty 2

## 2023-04-18 MED ORDER — INSULIN ASPART 100 UNIT/ML IJ SOLN: 0.0000 [IU] | Freq: Three times a day (TID) | INTRAMUSCULAR | Status: DC

## 2023-04-18 MED ORDER — INSULIN GLARGINE-YFGN 100 UNIT/ML ~~LOC~~ SOLN
50.0000 [IU] | Freq: Every day | SUBCUTANEOUS | Status: DC
  Filled 2023-04-18: qty 0.5

## 2023-04-18 MED ORDER — INSULIN DEGLUDEC 100 UNIT/ML ~~LOC~~ SOPN: 50.0000 [IU] | PEN_INJECTOR | Freq: Every day | SUBCUTANEOUS | Status: DC

## 2023-04-18 MED ORDER — POTASSIUM CHLORIDE CRYS ER 20 MEQ PO TBCR: 40.0000 meq | EXTENDED_RELEASE_TABLET | Freq: Two times a day (BID) | ORAL | Status: DC

## 2023-04-18 MED ORDER — LORAZEPAM 1 MG PO TABS
1.0000 mg | ORAL_TABLET | Freq: Once | ORAL | Status: AC
  Administered 2023-04-18: 1 mg via ORAL
  Filled 2023-04-18: qty 1

## 2023-04-18 MED ORDER — SODIUM CHLORIDE 0.9 % WEIGHT BASED INFUSION
3.0000 mL/kg/h | INTRAVENOUS | Status: DC
  Administered 2023-04-19: 3 mL/kg/h via INTRAVENOUS

## 2023-04-18 MED ORDER — SODIUM CHLORIDE 0.9 % WEIGHT BASED INFUSION
1.0000 mL/kg/h | INTRAVENOUS | Status: DC
  Administered 2023-04-19: 1 mL/kg/h via INTRAVENOUS

## 2023-04-18 MED ORDER — HYDROCHLOROTHIAZIDE 25 MG PO TABS
25.0000 mg | ORAL_TABLET | Freq: Every day | ORAL | Status: DC
  Filled 2023-04-18: qty 1

## 2023-04-18 MED ORDER — ENOXAPARIN SODIUM 40 MG/0.4ML IJ SOSY
40.0000 mg | PREFILLED_SYRINGE | INTRAMUSCULAR | Status: DC
  Administered 2023-04-18: 40 mg via SUBCUTANEOUS
  Filled 2023-04-18: qty 0.4

## 2023-04-18 NOTE — Plan of Care (Signed)
Transfer from [facility] Patient is a 62 year old female with pmh HTN, HLD, DM type II, anxiety, depression, and GERD who presented with complaints of chest pain.  Labs noted high-sensitivity troponins negative x 2, potassium 2.9, magnesium 2.1.  Chest x-ray showed no acute abnormality.  Admitted to a medical telemetry bed as observation for workup of chest pain.

## 2023-04-18 NOTE — ED Notes (Signed)
Administered 1 sublingual nitroglycerin, pain from level 10/10 to 6/10. Patient is now reporting left jaw pain as well. Bp 121/ 78 to 96/78. MD made aware. Emotional support provide, pt feeling very anxious.

## 2023-04-18 NOTE — ED Notes (Signed)
Pt stated she feels like the pain is decreased, still feels pressure

## 2023-04-18 NOTE — ED Notes (Signed)
Greggory Stallion at [facility] will send transport for Bed Ready at [facility] 2C Room 05.-ABB(NS)

## 2023-04-18 NOTE — Consult Note (Addendum)
Cardiology Consultation   Patient ID: [name] MRN: [id number]; DOB: 1961/07/13  Admit date: 04/18/2023 Date of Consult: 04/18/2023  PCP:  Jamal Collin, PA-C   [facility] Providers Cardiologist:  None   {  Patient Profile:   [name] is a 62 y.o. female with a hx of hypertension, diabetes mellitus type 2, hyperlipidemia, Barrett's esophagus, previous tobacco use, GERD who is being seen 04/18/2023 for the evaluation of chest pain at the request of Dr Chipper Herb.  History of Present Illness:   [name] has no prior significant cardiac history, however does have a mother who has had CABG done in her 43s and 39s.  She also used to smoke, however has quit, but other reports conflict with this. She has not been seen by cardiology recently, however did have a cardiac catheterization in 2013 that did not note any coronary artery disease. In 2015 patient did have CTA that revealed mild to moderate LAD coronary artery calcifications.  This morning patient presented with severe chest pain that woke her up from her sleep that she says radiated to her left jaw and shoulder.  She describes this pain as heavy and squeezing.  She took her husband's 2 sublingual nitroglycerin and found intermittent relief, but has had return of chest pain that has persists intermittently with these nitroglycerin doses.  In addition, patient has noted decrease in functional status when walking with her friend.  She states that she is not able to walk without experiencing shortness of breath and more difficulty.  She denies any recent illness, nausea, vomiting, fever, chills, peripheral edema.  EKG showing no acute ST-T wave changes.  Troponins negative x 2.  Potassium 2.9. CXR neg. Past Medical History:  Diagnosis Date   Anxiety    Barrett esophagus    Dr. Juanda Chance in GI   Cancer [facility])    Skin Cancer   Chronic back pain    stenosis and DDD   DDD (degenerative disc disease)    s/p back  surgeries and steroid injxns   Depression    takes Fluoxetine daily   Diabetes mellitus    Victoza daily   GERD (gastroesophageal reflux disease)    takes Nexium daily   History of kidney stones    Hyperlipidemia    hasn't been on Pravastatin in 6-7wks    Hypertension    takes Amlodipine and HCTZ daily   Interstitial cystitis    Joint pain    Joint swelling    Muscle spasm    takes Ativan nightly as needed   Numbness    weakness in right leg   Pneumonia    fall 2014   PONV (postoperative nausea and vomiting)    Spinal headache    blood patch required    Urinary frequency     Past Surgical History:  Procedure Laterality Date   adhesions removed from abdomen     APPENDECTOMY     BACK SURGERY  12/2007   x 6-fusion x 2   BIOPSY  05/19/2022   Procedure: BIOPSY;  Surgeon: Willis Modena, MD;  Location: [facility];  Service: Gastroenterology;;   BLADDER SUSPENSION N/A 01/30/2023   Procedure: TRANSVAGINAL TAPE (TVT) PROCEDURE;  Surgeon: Marguerita Beards, MD;  Location: [facility];  Service: Gynecology;  Laterality: N/A;  total time requested is 1 hour   CARDIAC CATHETERIZATION  2013   CESAREAN SECTION  1988/1989   CHOLECYSTECTOMY     COLONOSCOPY  2011  COLONOSCOPY WITH PROPOFOL N/A 05/19/2022   Procedure: COLONOSCOPY WITH PROPOFOL;  Surgeon: Willis Modena, MD;  Location: [facility];  Service: Gastroenterology;  Laterality: N/A;   CYSTOSCOPY  10/27/2022   Procedure: CYSTOSCOPY;  Surgeon: Marguerita Beards, MD;  Location[facility];  Service: Gynecology;;   CYSTOSCOPY N/A 01/30/2023   Procedure: Bluford Kaufmann;  Surgeon: Marguerita Beards, MD;  Location: [facility];  Service: Gynecology;  Laterality: N/A;   epidural injections     KNEE ARTHROSCOPY Bilateral    LAPAROSCOPIC LYSIS OF ADHESIONS N/A 04/15/2014   Procedure: LAPAROSCOPIC LYSIS OF ADHESION;  Surgeon: Clovis Pu. Cornett, MD;  Location: [facility] OR;  Service: General;   Laterality: N/A;   LAPAROSCOPY N/A 04/15/2014   Procedure: LAPAROSCOPY DIAGNOSTIC;  Surgeon: Clovis Pu. Cornett, MD;  Location: [facility] OR;  Service: General;  Laterality: N/A;   LEFT HEART CATHETERIZATION WITH CORONARY ANGIOGRAM N/A 02/03/2012   Procedure: LEFT HEART CATHETERIZATION WITH CORONARY ANGIOGRAM;  Surgeon: Lennette Bihari, MD;  [facility];  Service: Cardiovascular;  Laterality: N/A;   LUMBAR FUSION Right 03/24/2015   Procedure: SI joint fusion - right ;  Surgeon: Aliene Beams, MD;  Location: [facility]URO [facility]S;  Service: Neurosurgery;  Laterality: Right;  SI joint fusion - right    NECK SURGERY  12/2007   OOPHORECTOMY     TOTAL ABDOMINAL HYSTERECTOMY     TOTAL KNEE ARTHROPLASTY Right 12/25/2018   TOTAL KNEE ARTHROPLASTY Right 12/25/2018   Procedure: RIGHT TOTAL KNEE ARTHROPLASTY;  Surgeon: Cammy Copa, MD;  Location: [facility] OR;  Service: Orthopedics;  Laterality: Right;   upper gi endoscopy     URETHRAL CYST REMOVAL N/A 10/27/2022   Procedure: EXAM UNDER ANESTHESIA, URETHRAL DIVERTICULECTOMY, REPAIR OF BLADDER INJURY;  Surgeon: Marguerita Beards, MD;  Location: [facility];  Service: Gynecology;  Laterality: N/A;     Inpatient Medications: Scheduled Meds:  amLODipine  5 mg Oral Daily   [START ON 04/19/2023] aspirin EC  81 mg Oral Daily   DULoxetine  20 mg Oral Daily   enoxaparin (LOVENOX) injection  40 mg Subcutaneous Q24H   ezetimibe  10 mg Oral Daily   fesoterodine  4 mg Oral Daily   FLUoxetine  40 mg Oral Daily   hydrochlorothiazide  25 mg Oral Daily   [START ON 04/19/2023] insulin aspart  0-20 Units Subcutaneous TID WC   insulin degludec  50 Units Subcutaneous Daily   nicotine  14 mg Transdermal Daily   pantoprazole  40 mg Oral BID   Continuous Infusions:  PRN Meds: acetaminophen, acetaminophen, cyclobenzaprine, HYDROcodone-acetaminophen, nitroGLYCERIN, ondansetron (ZOFRAN) IV, mouth rinse  Allergies:    Allergies  Allergen Reactions   Latex  Anaphylaxis   Penicillins Anaphylaxis and Other (See Comments)    Has patient had a PCN reaction causing immediate rash, facial/tongue/throat swelling, SOB or lightheadedness with hypotension: No Has patient had a PCN reaction causing severe rash involving mucus membranes or skin necrosis: No Has patient had a PCN reaction that required hospitalization No Has patient had a PCN reaction occurring within the last 10 years: No If all of the above answers are "NO", then may proceed with Cephalosporin use.   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 She is still active and ambulate on her own. She is currently smoking up to a pack per day but interested in stopping.    Social Determinants of Health   Financial Resource Strain: Not on file  Food Insecurity: No Food Insecurity (04/18/2023)   Hunger Vital Sign    Worried About Running Out  of Food in the Last Year: Never true    Ran Out of Food in the Last Year: Never true  Transportation Needs: No Transportation Needs (04/18/2023)   PRAPARE - Administrator, Civil Service (Medical): No    Lack of Transportation (Non-Medical): No  Physical Activity: Not on file  Stress: Not on file  Social Connections: Not on file  Intimate Partner Violence: Not on file    Family History:   Family History  Problem Relation Age of Onset   Kidney disease Mother    Heart disease Mother        ischemic   Gallbladder disease Mother    Other Mother        rectovaginal fistula   Hypertension Father    Colon polyps Father    Allergies Sister    Asthma Sister      ROS:  Please see the history of present illness.  All other ROS reviewed and negative.     Physical Exam/Data:   Vitals:   04/18/23 1145 04/18/23 1358 04/18/23 1519 04/18/23 1520  BP: 115/80 126/78  125/73  Pulse: 81 71  65  Resp: 15 12  12   Temp:    98.6 F (37 C)  TempSrc:    Oral  SpO2: 100% 100%  97%  Weight:   85.9 kg   Height:   5\' 6"  (1.676 m)    No intake or output data in the 24 hours ending 04/18/23 1647    04/18/2023    3:19 PM 04/18/2023    7:14 AM 01/30/2023   10:33 AM  Last 3 Weights  Weight (lbs) 189 lb 6 oz 181 lb 187 lb 8 oz  Weight (kg) 85.9 kg 82.101 kg 85.049 kg     Body mass index is 30.57 kg/m.  General:  Well nourished, well developed, in no acute distress HEENT: normal Neck: no JVD Vascular: No carotid bruits; Distal pulses 2+ bilaterally Cardiac:  normal S1, S2; RRR; no murmur  Lungs:  clear to auscultation bilaterally, no wheezing, rhonchi or  rales  Abd: soft, nontender, no hepatomegaly  Ext: no edema Musculoskeletal:  No deformities, BUE and BLE strength normal and equal Skin: warm and dry  Neuro:  CNs 2-12 intact, no focal abnormalities noted Psych:  Normal affect   EKG:  The EKG was personally reviewed and demonstrates:  normal sinus rhythm, heart rate 77.  No  acute ST-T wave changes Telemetry:  Telemetry was personally reviewed and demonstrates: Normal sinus rhythm heart rate in the 60s.  Relevant CV Studies:  Laboratory Data:  High Sensitivity Troponin:   Recent Labs  Lab 04/18/23 0721 04/18/23 0932  TROPONINIHS 3 3     Chemistry Recent Labs  Lab 04/18/23 0721 04/18/23 0827  NA 140  --   K 2.9*  --   CL 98  --   CO2 31  --   GLUCOSE 128*  --   BUN 14  --   CREATININE 0.90  --   CALCIUM 9.3  --   MG  --  2.1  GFRNONAA >60  --   ANIONGAP 11  --     Recent Labs  Lab 04/18/23 0721  PROT 6.8  ALBUMIN 4.1  AST 17  ALT 12  ALKPHOS 71  BILITOT 0.5   Lipids No results for input(s): "CHOL", "TRIG", "HDL", "LABVLDL", "LDLCALC", "CHOLHDL" in the last 168 hours.  Hematology Recent Labs  Lab 04/18/23 0721  WBC 8.7  RBC 4.23  HGB 13.0  HCT 37.0  MCV 87.5  MCH 30.7  MCHC 35.1  RDW 12.7  PLT 357   Thyroid No results for input(s): "TSH", "FREET4" in the last 168 hours.  BNPNo results for input(s): "BNP", "PROBNP" in the last 168 hours.  DDimer  Recent Labs  Lab 04/18/23 0721  DDIMER <0.27     Radiology/Studies:  [facility] Chest Port 1 View  Result Date: 04/18/2023 CLINICAL DATA:  Chest pain EXAM: PORTABLE CHEST 1 VIEW COMPARISON:  09/14/2014 FINDINGS: Normal heart size and mediastinal contours. No acute infiltrate or edema. No effusion or pneumothorax. No acute osseous findings. Artifact from EKG leads. IMPRESSION: No evidence of active disease. Electronically Signed   By: Tiburcio Pea M.D.   On: 04/18/2023 07:41     Assessment and Plan:   Chest pain Patient presenting with concerning features of chest pain that mimic ACS however has had negative troponin's and no ischemic changes on EKG.  Previous CTA in 2015 did showed mild to moderate disease in the LAD.  Given her significant risk factors, both family and personal we will plan for cardiac catheterization tomorrow for further definitve evaluation. Scheduled  04/19/2023.  Echo pending Continue aspirin 81 mg  Informed Consent   Shared Decision Making/Informed Consent{  The risks [stroke (1 in 1000), death (1 in 1000), kidney failure [usually temporary] (1 in 500), bleeding (1 in 200), allergic reaction [possibly serious] (1 in 200)], benefits (diagnostic support and management of coronary artery disease) and alternatives of a cardiac catheterization were discussed in detail with Ms. Whiteley and she is willing to proceed.  Hypertension Continue amlodipine 5 mg daily. Well-controlled Stopping HCTZ due to hypokalemia.  See below.  Hyperlipidemia Continue Zetia 10.  Will obtain lipid panel.  Has history of intolerance to statins.  Hypokalemia.   Potassium 2.9.  Will supplement.  Likely due to HCTZ.  Will stop.  Tobacco use? Patient denies but other reports say she is continuing. Need to clarify   Insulin-dependent diabetes mellitus Risk Assessment/Risk Scores:    For questions or updates, please  contact [facility] Please consult [email or web address] for contact info under    Signed, Abagail Kitchens, PA-C  04/18/2023 4:47 PM   Personally seen and examined. Agree with above.  89 year old, mother CABG 44, DM, former smoker 10 years ago quit with HTN, HL here with CP, squeezing central with radiation to neck, left arm, improved with NTG from husband. SOB past month when walking neighborhood.   RRR, 2+ radial pulse, lungs clear  A/P:  Angina - concerning story. Trop normal, ecg no changes, however prior CT in 2015 of chest LAD calcification, known CAD.  Plan cardiac cath - risk and benefits reviewed including CVA, MI.  Willing to proceed.   Hypokalemia - being repleted. 2.9. Stopped hydrochlorothiazide  DM - primary team  Donato Schultz, MD

## 2023-04-18 NOTE — ED Triage Notes (Signed)
Pt arrives pov, c/o LT side CP radiating to neck that started upon waking today. Denies shob, endorses nausea. Reports taking 2 of husbands nitroglycerin

## 2023-04-18 NOTE — ED Provider Notes (Addendum)
[place] EMERGENCY DEPARTMENT AT [facility] Provider Note   CSN: [id number] Arrival date & time: 04/18/23  7829     History  Chief Complaint  Patient presents with   Chest Pain    [name] is a 62 y.o. female.   Chest Pain    62 year old female with medical history significant for HLD, anxiety, GERD, Barrett's esophagus, HTN, DM2 who presents to the emergency department with chest pain.  The patient states that she developed left-sided chest pressure that radiates to her neck and jaw.  She denies any shortness of breath, diaphoresis.  She endorses nausea.  She states that she took 2 of her husbands sublingual nitroglycerin which alleviated the pain somewhat.  She denies any cough, fever or chills.  No ripping or tearing component of the chest pain, no radiation to the back.  No lower extremity swelling.  She denies any history of ACS, DVT or PE.  Home Medications Prior to Admission medications   Medication Sig Start Date End Date Taking? Authorizing Provider  acetaminophen (TYLENOL) 500 MG tablet Take 1 tablet (500 mg total) by mouth every 6 (six) hours as needed (pain). 09/28/22   Marguerita Beards, MD  amLODipine (NORVASC) 5 MG tablet Take 5 mg by mouth daily.    [provider]  cyclobenzaprine (FLEXERIL) 10 MG tablet Take 1 tablet (10 mg total) by mouth 3 (three) times daily as needed for muscle spasms. 12/09/20   Julio Sicks, MD  DULoxetine (CYMBALTA) 20 MG capsule Take 20 mg by mouth daily.    [provider]  ELDERBERRY PO Take by mouth.    [provider]  ezetimibe (ZETIA) 10 MG tablet Take 10 mg by mouth daily. 10/08/20   [provider]  FLUoxetine (PROZAC) 40 MG capsule Take 1 capsule (40 mg total) by mouth daily. NEEDS APPT FOR REFILLS 07/10/12   Nyoka Cowden, MD  hydrochlorothiazide (HYDRODIURIL) 25 MG tablet Take 1 tablet (25 mg total) by mouth daily. NEEDS APPT FOR REFILLS 07/10/12   Nyoka Cowden, MD   HYDROcodone-acetaminophen (NORCO/VICODIN) 5-325 MG tablet 1 po q 4-6hrs prn pain Patient taking differently: Take 1 tablet by mouth every 4 (four) hours as needed for moderate pain. 01/04/19   Cammy Copa, MD  ibuprofen (ADVIL) 600 MG tablet Take 1 tablet (600 mg total) by mouth every 6 (six) hours as needed. 09/28/22   Marguerita Beards, MD  ibuprofen (ADVIL) 800 MG tablet Take 1 tablet (800 mg total) by mouth every 8 (eight) hours as needed. 01/24/23   Marguerita Beards, MD  insulin lispro (HUMALOG) 100 UNIT/ML injection Inject 2 Units into the skin See admin instructions. Inject 2 units SQ up to 6 times daily as needed for BS > 250    [provider]  OZEMPIC, 1 MG/DOSE, 4 MG/3ML SOPN Inject 1 mg into the skin once a week. 01/18/22   [provider]  pantoprazole (PROTONIX) 40 MG tablet Take 40 mg by mouth 2 (two) times daily. 11/17/20   [provider]  potassium chloride (KLOR-CON) 10 MEQ tablet Take 10 mEq by mouth 2 (two) times daily. Takes every other month per pt 05/11/22   [provider]  TRESIBA FLEXTOUCH 100 UNIT/ML SOPN FlexTouch Pen Inject 50 Units into the skin daily.  08/19/18   [provider]  trospium (SANCTURA) 20 MG tablet Take 1 tablet (20 mg total) by mouth 2 (two) times daily. 03/02/23   Marguerita Beards, MD  Allergies    Latex, Penicillins, Erythromycin, Ofloxacin, Sulfonamide derivatives, Azithromycin, Fenofibrate, and Statins    Review of Systems   Review of Systems  Cardiovascular:  Positive for chest pain.  All other systems reviewed and are negative.   Physical Exam Updated Vital Signs BP (!) 140/84   Pulse 65   Temp 98.2 F (36.8 C)   Resp 11   Ht 5\' 6"  (1.676 m)   Wt 82.1 kg   SpO2 100%   BMI 29.21 kg/m  Physical Exam Vitals and nursing note reviewed.  Constitutional:      General: She is not in acute distress.    Appearance: She is well-developed.  HENT:     Head: Normocephalic and  atraumatic.  Eyes:     Conjunctiva/sclera: Conjunctivae normal.  Cardiovascular:     Rate and Rhythm: Normal rate and regular rhythm.  Pulmonary:     Effort: Pulmonary effort is normal. No respiratory distress.     Breath sounds: Normal breath sounds.  Abdominal:     Palpations: Abdomen is soft.     Tenderness: There is no abdominal tenderness.  Musculoskeletal:        General: No swelling.     Cervical back: Neck supple.  Skin:    General: Skin is warm and dry.     Capillary Refill: Capillary refill takes less than 2 seconds.  Neurological:     Mental Status: She is alert.  Psychiatric:        Mood and Affect: Mood normal.     ED Results / Procedures / Treatments   Labs (all labs ordered are listed, but only abnormal results are displayed) Labs Reviewed  COMPREHENSIVE METABOLIC PANEL - Abnormal; Notable for the following components:      Result Value   Potassium 2.9 (*)    Glucose, Bld 128 (*)    All other components within normal limits  CBC  MAGNESIUM  D-DIMER, QUANTITATIVE  TROPONIN I (HIGH SENSITIVITY)  TROPONIN I (HIGH SENSITIVITY)    EKG EKG Interpretation  Date/Time:  Tuesday April 18 2023 08:18:07 EDT Ventricular Rate:  81 PR Interval:  145 QRS Duration: 85 QT Interval:  427 QTC Calculation: 496 R Axis:   53 Text Interpretation: Sinus rhythm Low voltage, precordial leads Borderline prolonged QT interval Confirmed by Ernie Avena ([id number]) on 04/18/2023 8:26:34 AM  Radiology DG Chest Port 1 View  Result Date: 04/18/2023 CLINICAL DATA:  Chest pain EXAM: PORTABLE CHEST 1 VIEW COMPARISON:  09/14/2014 FINDINGS: Normal heart size and mediastinal contours. No acute infiltrate or edema. No effusion or pneumothorax. No acute osseous findings. Artifact from EKG leads. IMPRESSION: No evidence of active disease. Electronically Signed   By: Tiburcio Pea M.D.   On: 04/18/2023 07:41    Procedures Procedures    Medications Ordered in ED Medications   nitroGLYCERIN (NITROSTAT) SL tablet 0.4 mg (0.4 mg Sublingual Given 04/18/23 0812)  aspirin EC tablet 325 mg (325 mg Oral Given 04/18/23 0811)  potassium chloride SA (KLOR-CON M) CR tablet 40 mEq (40 mEq Oral Given 04/18/23 0928)  morphine (PF) 4 MG/ML injection 4 mg (4 mg Intravenous Given 04/18/23 0838)  ondansetron (ZOFRAN) injection 4 mg (4 mg Intravenous Given 04/18/23 0835)  LORazepam (ATIVAN) tablet 1 mg (1 mg Oral Given 04/18/23 1008)    ED Course/ Medical Decision Making/ A&P             HEART Score: 5  Medical Decision Making Amount and/or Complexity of Data Reviewed Labs: ordered. Radiology: ordered.  Risk OTC drugs. Prescription drug management. Decision regarding hospitalization.     62 year old female with medical history significant for HLD, anxiety, GERD, Barrett's esophagus, HTN, DM2 who presents to the emergency department with chest pain.  The patient states that she developed left-sided chest pressure that radiates to her neck and jaw.  She denies any shortness of breath, diaphoresis.  She endorses nausea.  She states that she took 2 of her husbands sublingual nitroglycerin which alleviated the pain somewhat.  She denies any cough, fever or chills.  No ripping or tearing component of the chest pain, no radiation to the back.  No lower extremity swelling.  She denies any history of ACS, DVT or PE.  Medical Decision Making: [name] is a 62 y.o. female who presented to the ED today with chest pain, detailed above.  Based on patient's comorbidities, patient has a heart score of 5.    Patient placed on continuous vitals and telemetry monitoring while in ED which was reviewed periodically.  Complete initial physical exam performed, notably the patient was CTAB, chest wall and abdomen nontender.   Reviewed and confirmed nursing documentation for past medical history, family history, social history.    Initial Assessment: With the patient's  presentation of left-sided chest pain, most likely diagnosis is ACS vs unstable angina. Other diagnoses were considered including (but not limited to) pulmonary embolism, community-acquired pneumonia, aortic dissection, pneumothorax, underlying bony abnormality, anemia, MSK, GERD. These are considered less likely due to history of present illness and physical exam findings.    In particular, concerning pulmonary embolism: Patient is PERC positive and the they deny malignancy, recent surgery, history of DVT, or calf tenderness leading to a low risk Wells score. Aortic Dissection also reconsidered but seems less likely based on the location, quality, onset, and severity of symptoms in this case.  This is most consistent with an acute complicated illness   Initial Plan: Evaluate for ACS with delta troponin and EKG evaluated as below  Evaluate for dissection, bony abnormality, or pneumonia with chest x-ray and screening laboratory evaluation including CBC, BMP  Further evaluation for pulmonary embolism indicated at this time based on patient's PERC and Wells score. Dimer ordered Further evaluation for Thoracic Aortic Dissection not indicated at this time based on patient's clinical history and PE findings.   Initial Study Results: EKG was reviewed independently. Rate, rhythm, axis, intervals all examined and without medically relevant abnormality. ST segments without concerns for elevations.    Laboratory  Delta troponin demonstrated normal values   CBC and BMP without obvious metabolic or inflammatory abnormalities requiring further evaluation other than mild hypokalemia, replenish orally  Mg was normal  Dimer normal  Radiology  DG Chest Port 1 View  Result Date: 04/18/2023 CLINICAL DATA:  Chest pain EXAM: PORTABLE CHEST 1 VIEW COMPARISON:  09/14/2014 FINDINGS: Normal heart size and mediastinal contours. No acute infiltrate or edema. No effusion or pneumothorax. No acute osseous findings.  Artifact from EKG leads. IMPRESSION: No evidence of active disease. Electronically Signed   By: Tiburcio Pea M.D.   On: 04/18/2023 07:41    Final Assessment and Plan: The patient has a assuring laboratory evaluation.  She has no significant ST segment changes to indicate ongoing ischemia however she is a heart score of 5 and continues to endorse chest pressure that has been responsive to nitroglycerin.  Due to this, I recommended medicine admission for chest  pain observation and further monitoring and management.  Hospitalist medicine consulted for admission, Dr. Katrinka Blazing accepting.    Final Clinical Impression(s) / ED Diagnoses Final diagnoses:  Chest pain, unspecified type  Hypokalemia    Rx / DC Orders ED Discharge Orders     None         Ernie Avena, MD 04/18/23 1038    Ernie Avena, MD 04/18/23 1056    Ernie Avena, MD 04/18/23 2144

## 2023-04-18 NOTE — H&P (Signed)
History and Physical    [name] QQV:[id number] DOB: Nov 16, 1960 DOA: 04/18/2023  PCP: Jamal Collin, PA-C (Confirm with patient/family/NH records and if not entered, this has to be entered at [facility] point of entry) Patient coming from: Home  I have personally briefly reviewed patient's old medical records in [facility] Link  Chief Complaint: Chest pain  HPI: [name] is a 62 y.o. female with medical history significant of HTN, IDDM with insulin resistance, HLD, cigarette smoking, family history of early onset CAD/CABG, anxiety/depression GERD, came with new onset of chest pain.  Symptoms started this morning, patient woke up with severe 10/10 squeezing-like chest pain radiating to left jaw and left shoulder and left arm, associated with nausea but denies any vomiting sweating lightheadedness.  Came to ED at [facility], initial study showed troponin negative x 2 and EKG no acute ST changes.  The chest pain initially responded to nitroglycerin however after about 1 hour the chest pain came back and subsequently patient was given another 2 rounds of nitroglycerin and at the time as well patient she still claims 3-4/10 squeezing-like chest pain.  Patient also reported to her mother had triple CABG in her early 35s and she continues to smoke cigarettes.   Review of Systems: As per HPI otherwise 14 point review of systems negative.    Past Medical History:  Diagnosis Date   Anxiety    Barrett esophagus    Dr. Juanda Chance in GI   [facility])    Skin Cancer   Chronic back pain    stenosis and DDD   DDD (degenerative disc disease)    s/p back surgeries and steroid injxns   Depression    takes Fluoxetine daily   Diabetes mellitus    Victoza daily   GERD (gastroesophageal reflux disease)    takes Nexium daily   History of kidney stones    Hyperlipidemia    hasn't been on Pravastatin in 6-7wks    Hypertension    takes Amlodipine and HCTZ daily   Interstitial cystitis     Joint pain    Joint swelling    Muscle spasm    takes Ativan nightly as needed   Numbness    weakness in right leg   Pneumonia    fall 2014   PONV (postoperative nausea and vomiting)    Spinal headache    blood patch required    Urinary frequency     Past Surgical History:  Procedure Laterality Date   adhesions removed from abdomen     APPENDECTOMY     BACK SURGERY  12/2007   x 6-fusion x 2   BIOPSY  05/19/2022   Procedure: BIOPSY;  Surgeon: Willis Modena, MD;  Location: [facility];  Service: Gastroenterology;;   BLADDER SUSPENSION N/A 01/30/2023   Procedure: TRANSVAGINAL TAPE (TVT) PROCEDURE;  Surgeon: Marguerita Beards, MD;  Location: [facility];  Service: Gynecology;  Laterality: N/A;  total time requested is 1 hour   CARDIAC CATHETERIZATION  2013   CESAREAN SECTION  1988/1989   CHOLECYSTECTOMY     COLONOSCOPY  2011   COLONOSCOPY WITH PROPOFOL N/A 05/19/2022   Procedure: COLONOSCOPY WITH PROPOFOL;  Surgeon: Willis Modena, MD;  Location: [facility];  Service: Gastroenterology;  Laterality: N/A;   CYSTOSCOPY  10/27/2022   Procedure: CYSTOSCOPY;  Surgeon: Marguerita Beards, MD;  Location: [facility];  Service: Gynecology;;   CYSTOSCOPY N/A 01/30/2023   Procedure: Bluford Kaufmann;  Surgeon: Marguerita Beards, MD;  Location: [facility];  Service: Gynecology;  Laterality: N/A;   epidural injections     KNEE ARTHROSCOPY Bilateral    LAPAROSCOPIC LYSIS OF ADHESIONS N/A 04/15/2014   Procedure: LAPAROSCOPIC LYSIS OF ADHESION;  Surgeon: Clovis Pu. Cornett, MD;  Location: [facility] OR;  Service: General;  Laterality: N/A;   LAPAROSCOPY N/A 04/15/2014   Procedure: LAPAROSCOPY DIAGNOSTIC;  Surgeon: Clovis Pu. Cornett, MD;  Location: [facility] OR;  Service: General;  Laterality: N/A;   LEFT HEART CATHETERIZATION WITH CORONARY ANGIOGRAM N/A 02/03/2012   Procedure: LEFT HEART CATHETERIZATION WITH CORONARY ANGIOGRAM;  Surgeon: Lennette Bihari, MD;   Location: [facility] CATH LAB;  Service: Cardiovascular;  Laterality: N/A;   LUMBAR FUSION Right 03/24/2015   Procedure: SI joint fusion - right ;  Surgeon: Aliene Beams, MD;  Location: [facility];  Service: Neurosurgery;  Laterality: Right;  SI joint fusion - right    NECK SURGERY  12/2007   OOPHORECTOMY     TOTAL ABDOMINAL HYSTERECTOMY     TOTAL KNEE ARTHROPLASTY Right 12/25/2018   TOTAL KNEE ARTHROPLASTY Right 12/25/2018   Procedure: RIGHT TOTAL KNEE ARTHROPLASTY;  Surgeon: Cammy Copa, MD;  Location: [facility] OR;  Service: Orthopedics;  Laterality: Right;   upper gi endoscopy     URETHRAL CYST REMOVAL N/A 10/27/2022   Procedure: EXAM UNDER ANESTHESIA, URETHRAL DIVERTICULECTOMY, REPAIR OF BLADDER INJURY;  Surgeon: Marguerita Beards, MD;  Location: [facility];  Service: Gynecology;  Laterality: N/A;     reports that she has quit smoking. Her smoking use included cigarettes. She has a 5.10 pack-year smoking history. She has never used smokeless tobacco. She reports current alcohol use. She reports that she does not use drugs.  Allergies  Allergen Reactions   Latex Anaphylaxis   Penicillins Anaphylaxis and Other (See Comments)    Has patient had a PCN reaction causing immediate rash, facial/tongue/throat swelling, SOB or lightheadedness with hypotension: No Has patient had a PCN reaction causing severe rash involving mucus membranes or skin necrosis: No Has patient had a PCN reaction that required hospitalization No Has patient had a PCN reaction occurring within the last 10 years: No If all of the above answers are "NO", then may proceed with Cephalosporin use.   Erythromycin Hives and Itching   Ofloxacin Hives and Itching   Sulfonamide Derivatives Hives and Itching   Azithromycin Other (See Comments)    "makes my heart rate go crazy"   Fenofibrate Other (See Comments)    Cramps   Statins Nausea And Vomiting    Cramps    Family History  Problem Relation Age of  Onset   Kidney disease Mother    Heart disease Mother        ischemic   Gallbladder disease Mother    Other Mother        rectovaginal fistula   Hypertension Father    Colon polyps Father    Allergies Sister    Asthma Sister      Prior to Admission medications   Medication Sig Start Date End Date Taking? Authorizing Provider  amLODipine (NORVASC) 5 MG tablet Take 5 mg by mouth daily.   Yes [provider]  cyclobenzaprine (FLEXERIL) 10 MG tablet Take 1 tablet (10 mg total) by mouth 3 (three) times daily as needed for muscle spasms. 12/09/20  Yes Pool, Sherilyn Cooter, MD  DULoxetine (CYMBALTA) 20 MG capsule Take 20 mg by mouth daily.   Yes [provider]  ezetimibe (ZETIA) 10 MG tablet  Take 10 mg by mouth daily. 10/08/20  Yes [provider]  FLUoxetine (PROZAC) 40 MG capsule Take 1 capsule (40 mg total) by mouth daily. NEEDS APPT FOR REFILLS 07/10/12  Yes Nyoka Cowden, MD  hydrochlorothiazide (HYDRODIURIL) 25 MG tablet Take 1 tablet (25 mg total) by mouth daily. NEEDS APPT FOR REFILLS 07/10/12  Yes Nyoka Cowden, MD  HYDROcodone-acetaminophen (NORCO/VICODIN) 5-325 MG tablet 1 po q 4-6hrs prn pain Patient taking differently: Take 1 tablet by mouth every 4 (four) hours as needed for moderate pain. 01/04/19  Yes Cammy Copa, MD  nitroGLYCERIN (NITROSTAT) 0.4 MG SL tablet Place 0.4 mg under the tongue every 5 (five) minutes as needed for chest pain.   Yes [provider]  OZEMPIC, 1 MG/DOSE, 4 MG/3ML SOPN Inject 1 mg into the skin once a week. 01/18/22  Yes [provider]  pantoprazole (PROTONIX) 40 MG tablet Take 40 mg by mouth 2 (two) times daily. 11/17/20  Yes [provider]  POTASSIUM PO Take 1 tablet by mouth daily. otc   Yes [provider]  TRESIBA FLEXTOUCH 100 UNIT/ML SOPN FlexTouch Pen Inject 50 Units into the skin daily.  08/19/18  Yes [provider]  acetaminophen (TYLENOL) 500 MG tablet Take 1 tablet (500 mg  total) by mouth every 6 (six) hours as needed (pain). 09/28/22   Marguerita Beards, MD  ibuprofen (ADVIL) 600 MG tablet Take 1 tablet (600 mg total) by mouth every 6 (six) hours as needed. Patient not taking: Reported on 04/18/2023 09/28/22   Marguerita Beards, MD  ibuprofen (ADVIL) 800 MG tablet Take 1 tablet (800 mg total) by mouth every 8 (eight) hours as needed. Patient not taking: Reported on 04/18/2023 01/24/23   Marguerita Beards, MD  insulin lispro (HUMALOG) 100 UNIT/ML injection Inject 2 Units into the skin See admin instructions. Inject 2 units SQ up to 6 times daily as needed for BS > 250    [provider]  trospium (SANCTURA) 20 MG tablet Take 1 tablet (20 mg total) by mouth 2 (two) times daily. 03/02/23   Marguerita Beards, MD    Physical Exam: Vitals:   04/18/23 1145 04/18/23 1358 04/18/23 1519 04/18/23 1520  BP: 115/80 126/78  125/73  Pulse: 81 71  65  Resp: 15 12  12   Temp:    98.6 F (37 C)  TempSrc:    Oral  SpO2: 100% 100%  97%  Weight:   85.9 kg   Height:   5\' 6"  (1.676 m)     Constitutional: NAD, calm, comfortable Vitals:   04/18/23 1145 04/18/23 1358 04/18/23 1519 04/18/23 1520  BP: 115/80 126/78  125/73  Pulse: 81 71  65  Resp: 15 12  12   Temp:    98.6 F (37 C)  TempSrc:    Oral  SpO2: 100% 100%  97%  Weight:   85.9 kg   Height:   5\' 6"  (1.676 m)    Eyes: PERRL, lids and conjunctivae normal ENMT: Mucous membranes are moist. Posterior pharynx clear of any exudate or lesions.Normal dentition.  Neck: normal, supple, no masses, no thyromegaly Respiratory: clear to auscultation bilaterally, no wheezing, no crackles. Normal respiratory effort. No accessory muscle use.  Cardiovascular: Regular rate and rhythm, no murmurs / rubs / gallops. No extremity edema. 2+ pedal pulses. No carotid bruits.  Abdomen: no tenderness, no masses palpated. No hepatosplenomegaly. Bowel sounds positive.  Musculoskeletal: no clubbing / cyanosis. No joint  deformity upper  and lower extremities. Good ROM, no contractures. Normal muscle tone.  Skin: no rashes, lesions, ulcers. No induration Neurologic: CN 2-12 grossly intact. Sensation intact, DTR normal. Strength 5/5 in all 4.  Psychiatric: Normal judgment and insight. Alert and oriented x 3. Normal mood.    Labs on Admission: I have personally reviewed following labs and imaging studies  CBC: Recent Labs  Lab 04/18/23 0721  WBC 8.7  HGB 13.0  HCT 37.0  MCV 87.5  PLT 357   Basic Metabolic Panel: Recent Labs  Lab 04/18/23 0721 04/18/23 0827  NA 140  --   K 2.9*  --   CL 98  --   CO2 31  --   GLUCOSE 128*  --   BUN 14  --   CREATININE 0.90  --   CALCIUM 9.3  --   MG  --  2.1   GFR: Estimated Creatinine Clearance: 72.4 mL/min (by C-G formula based on SCr of 0.9 mg/dL). Liver Function Tests: Recent Labs  Lab 04/18/23 0721  AST 17  ALT 12  ALKPHOS 71  BILITOT 0.5  PROT 6.8  ALBUMIN 4.1   No results for input(s): "LIPASE", "AMYLASE" in the last 168 hours. No results for input(s): "AMMONIA" in the last 168 hours. Coagulation Profile: No results for input(s): "INR", "PROTIME" in the last 168 hours. Cardiac Enzymes: No results for input(s): "CKTOTAL", "CKMB", "CKMBINDEX", "TROPONINI" in the last 168 hours. BNP (last 3 results) No results for input(s): "PROBNP" in the last 8760 hours. HbA1C: No results for input(s): "HGBA1C" in the last 72 hours. CBG: Recent Labs  Lab 04/18/23 1216  GLUCAP 99   Lipid Profile: No results for input(s): "CHOL", "HDL", "LDLCALC", "TRIG", "CHOLHDL", "LDLDIRECT" in the last 72 hours. Thyroid Function Tests: No results for input(s): "TSH", "T4TOTAL", "FREET4", "T3FREE", "THYROIDAB" in the last 72 hours. Anemia Panel: No results for input(s): "VITAMINB12", "FOLATE", "FERRITIN", "TIBC", "IRON", "RETICCTPCT" in the last 72 hours. Urine analysis:    Component Value Date/Time   COLORURINE AMBER (A) 05/16/2022 1147   APPEARANCEUR HAZY  (A) 05/16/2022 1147   LABSPEC 1.023 05/16/2022 1147   PHURINE 5.0 05/16/2022 1147   GLUCOSEU 150 (A) 05/16/2022 1147   GLUCOSEU NEGATIVE 05/30/2011 0930   HGBUR NEGATIVE 05/16/2022 1147   BILIRUBINUR negative 12/28/2022 0955   KETONESUR 5 (A) 05/16/2022 1147   PROTEINUR Positive (A) 12/28/2022 0955   PROTEINUR 100 (A) 05/16/2022 1147   UROBILINOGEN 0.2 12/28/2022 0955   UROBILINOGEN 1.0 07/23/2013 1348   NITRITE negative 12/28/2022 0955   NITRITE NEGATIVE 05/16/2022 1147   LEUKOCYTESUR Trace (A) 12/28/2022 0955   LEUKOCYTESUR NEGATIVE 05/16/2022 1147    Radiological Exams on Admission: DG Chest Port 1 View  Result Date: 04/18/2023 CLINICAL DATA:  Chest pain EXAM: PORTABLE CHEST 1 VIEW COMPARISON:  09/14/2014 FINDINGS: Normal heart size and mediastinal contours. No acute infiltrate or edema. No effusion or pneumothorax. No acute osseous findings. Artifact from EKG leads. IMPRESSION: No evidence of active disease. Electronically Signed   By: Tiburcio Pea M.D.   On: 04/18/2023 07:41    EKG: Independently reviewed.  Sinus rhythm, no acute ST changes.  Assessment/Plan Principal Problem:   Chest pain Active Problems:   Controlled type 2 diabetes mellitus without complication, with long-term current use of insulin (HCC)   OBESITY   SMOKER  (please populate well all problems here in Problem List. (For example, if patient is on BP meds at home and you resume or decide to hold them, it is a  problem that needs to be her. Same for CAD, COPD, HLD and so on)  Angina -ACS ruled out.  Case discussed with on-call cardiology fellow, given the significant risk factor of family history of early CAD CABG, HTN and HLD diabetes and cigarette smoke, cardiology recommended inpatient workup to rule out CAD.  Cardiology will see patient on consult. - 81 mg dailyStart aspirin -Lipid panel A1c -Echo -Cardio consult to follow  HTN -Controlled, continue hydrochlorothiazide amlodipine  IDDM with  insulin resistance -Continue Lantus 15 units daily -Resistant sliding scale -Outpatient Ozempic  HLD -On Zetia  Cigarette smoking -Cessation education performed at bedside -Nicotine patch  DVT prophylaxis: Lovenox Code Status: Full code Family Communication: Husband at bed side Disposition Plan: Expect less than 2 midnight hospital stay Consults called: Cardiology fellow Dr. Welton Flakes Admission status: Tele obs   Emeline General MD [facility] Pager [phone]  04/18/2023, 4:25 PM

## 2023-04-19 ENCOUNTER — Encounter (HOSPITAL_COMMUNITY)
Admission: EM | Disposition: A | Discharge status: Home / Self Care | Payer: Self-pay | Source: Home / Self Care | Attending: Internal Medicine

## 2023-04-19 ENCOUNTER — Observation Stay (HOSPITAL_BASED_OUTPATIENT_CLINIC_OR_DEPARTMENT_OTHER): Payer: Medicare Other

## 2023-04-19 DIAGNOSIS — R079 Chest pain, unspecified: Secondary | ICD-10-CM

## 2023-04-19 DIAGNOSIS — E119 Type 2 diabetes mellitus without complications: Secondary | ICD-10-CM

## 2023-04-19 DIAGNOSIS — Z794 Long term (current) use of insulin: Secondary | ICD-10-CM | POA: Diagnosis not present

## 2023-04-19 DIAGNOSIS — I2510 Atherosclerotic heart disease of native coronary artery without angina pectoris: Secondary | ICD-10-CM

## 2023-04-19 DIAGNOSIS — R072 Precordial pain: Secondary | ICD-10-CM | POA: Diagnosis not present

## 2023-04-19 HISTORY — PX: LEFT HEART CATH AND CORONARY ANGIOGRAPHY: CATH118249

## 2023-04-19 HISTORY — PX: CORONARY PRESSURE/FFR STUDY: CATH118243

## 2023-04-19 LAB — BASIC METABOLIC PANEL
Anion gap: 7 (ref 5–15)
Anion gap: 8 (ref 5–15)
BUN: 15 mg/dL (ref 8–23)
BUN: 15 mg/dL (ref 8–23)
CO2: 28 mmol/L (ref 22–32)
CO2: 31 mmol/L (ref 22–32)
Calcium: 8.7 mg/dL — ABNORMAL LOW (ref 8.9–10.3)
Calcium: 8.9 mg/dL (ref 8.9–10.3)
Chloride: 100 mmol/L (ref 98–111)
Chloride: 101 mmol/L (ref 98–111)
Creatinine, Ser: 0.8 mg/dL (ref 0.44–1.00)
Creatinine, Ser: 0.99 mg/dL (ref 0.44–1.00)
GFR, Estimated: >60 mL/min (ref >60)
GFR, Estimated: >60 mL/min (ref >60)
Glucose, Bld: 109 mg/dL — ABNORMAL HIGH (ref 70–99)
Glucose, Bld: 112 mg/dL — ABNORMAL HIGH (ref 70–99)
Potassium: 3.3 mmol/L — ABNORMAL LOW (ref 3.5–5.1)
Potassium: 4.2 mmol/L (ref 3.5–5.1)
Sodium: 137 mmol/L (ref 135–145)
Sodium: 138 mmol/L (ref 135–145)

## 2023-04-19 LAB — LIPID PANEL
Cholesterol: 176 mg/dL (ref 0–200)
HDL: 44 mg/dL (ref >40)
LDL Cholesterol: 104 mg/dL — ABNORMAL HIGH (ref 0–99)
Total CHOL/HDL Ratio: 4 RATIO
Triglycerides: 140 mg/dL (ref <150)
VLDL: 28 mg/dL (ref 0–40)

## 2023-04-19 LAB — CBC
HCT: 35.7 % — ABNORMAL LOW (ref 36.0–46.0)
Hemoglobin: 11.7 g/dL — ABNORMAL LOW (ref 12.0–15.0)
MCH: 29.6 pg (ref 26.0–34.0)
MCHC: 32.8 g/dL (ref 30.0–36.0)
MCV: 90.4 fL (ref 80.0–100.0)
Platelets: 298 10*3/uL (ref 150–400)
RBC: 3.95 MIL/uL (ref 3.87–5.11)
RDW: 12.6 % (ref 11.5–15.5)
WBC: 6.7 10*3/uL (ref 4.0–10.5)
nRBC: 0 % (ref 0.0–0.2)

## 2023-04-19 LAB — GLUCOSE, CAPILLARY
Glucose-Capillary: 104 mg/dL — ABNORMAL HIGH (ref 70–99)
Glucose-Capillary: 78 mg/dL (ref 70–99)
Glucose-Capillary: 87 mg/dL (ref 70–99)

## 2023-04-19 LAB — ECHOCARDIOGRAM COMPLETE
AR max vel: 2.64 cm2
AV Peak grad: 7 mmHg
Ao pk vel: 1.32 m/s
Area-P 1/2: 3.53 cm2
Height: 66 in
S' Lateral: 2.5 cm
Weight: 3030 oz

## 2023-04-19 LAB — TROPONIN I (HIGH SENSITIVITY): Troponin I (High Sensitivity): 3 ng/L (ref <18)

## 2023-04-19 LAB — POCT ACTIVATED CLOTTING TIME: Activated Clotting Time: 269 seconds

## 2023-04-19 SURGERY — LEFT HEART CATH AND CORONARY ANGIOGRAPHY
Anesthesia: LOCAL

## 2023-04-19 MED ORDER — LIDOCAINE HCL (PF) 1 % IJ SOLN
INTRAMUSCULAR | Status: AC
  Filled 2023-04-19: qty 30

## 2023-04-19 MED ORDER — SODIUM CHLORIDE 0.9% FLUSH: 3.0000 mL | INTRAVENOUS | Status: DC | PRN

## 2023-04-19 MED ORDER — HEPARIN SODIUM (PORCINE) 1000 UNIT/ML IJ SOLN
INTRAMUSCULAR | Status: AC
  Filled 2023-04-19: qty 10

## 2023-04-19 MED ORDER — ONDANSETRON HCL 4 MG/2ML IJ SOLN
INTRAMUSCULAR | Status: AC
  Filled 2023-04-19: qty 2

## 2023-04-19 MED ORDER — ASPIRIN 81 MG PO CHEW: 81.0000 mg | CHEWABLE_TABLET | Freq: Every day | ORAL | Status: DC

## 2023-04-19 MED ORDER — METOPROLOL SUCCINATE ER 25 MG PO TB24
25.0000 mg | ORAL_TABLET | Freq: Every day | ORAL | Status: DC
  Administered 2023-04-19 – 2023-04-20 (×2): 25 mg via ORAL
  Filled 2023-04-19 (×2): qty 1

## 2023-04-19 MED ORDER — ENOXAPARIN SODIUM 40 MG/0.4ML IJ SOSY
40.0000 mg | PREFILLED_SYRINGE | INTRAMUSCULAR | Status: DC
  Administered 2023-04-20: 40 mg via SUBCUTANEOUS
  Filled 2023-04-19: qty 0.4

## 2023-04-19 MED ORDER — MIDAZOLAM HCL 2 MG/2ML IJ SOLN
INTRAMUSCULAR | Status: AC
  Filled 2023-04-19: qty 2

## 2023-04-19 MED ORDER — INSULIN GLARGINE-YFGN 100 UNIT/ML ~~LOC~~ SOLN
25.0000 [IU] | Freq: Once | SUBCUTANEOUS | Status: AC
  Administered 2023-04-19: 25 [IU] via SUBCUTANEOUS
  Filled 2023-04-19: qty 0.25

## 2023-04-19 MED ORDER — IOHEXOL 350 MG/ML SOLN
INTRAVENOUS | Status: DC | PRN
  Administered 2023-04-19: 105 mL

## 2023-04-19 MED ORDER — SODIUM CHLORIDE 0.9% FLUSH
3.0000 mL | Freq: Two times a day (BID) | INTRAVENOUS | Status: DC
  Administered 2023-04-19: 3 mL via INTRAVENOUS

## 2023-04-19 MED ORDER — ACETAMINOPHEN 325 MG PO TABS: 650.0000 mg | ORAL_TABLET | ORAL | Status: DC | PRN

## 2023-04-19 MED ORDER — VERAPAMIL HCL 2.5 MG/ML IV SOLN
INTRAVENOUS | Status: AC
  Filled 2023-04-19: qty 2

## 2023-04-19 MED ORDER — HYDRALAZINE HCL 20 MG/ML IJ SOLN: 10.0000 mg | INTRAMUSCULAR | Status: AC | PRN

## 2023-04-19 MED ORDER — FENTANYL CITRATE (PF) 100 MCG/2ML IJ SOLN
INTRAMUSCULAR | Status: DC | PRN
  Administered 2023-04-19 (×2): 25 ug via INTRAVENOUS

## 2023-04-19 MED ORDER — ISOSORBIDE MONONITRATE ER 30 MG PO TB24
30.0000 mg | ORAL_TABLET | Freq: Every day | ORAL | Status: DC
  Administered 2023-04-19 – 2023-04-20 (×2): 30 mg via ORAL
  Filled 2023-04-19 (×2): qty 1

## 2023-04-19 MED ORDER — HEPARIN SODIUM (PORCINE) 1000 UNIT/ML IJ SOLN
INTRAMUSCULAR | Status: DC | PRN
  Administered 2023-04-19: 4000 [IU] via INTRAVENOUS
  Administered 2023-04-19: 4500 [IU] via INTRAVENOUS
  Administered 2023-04-19: 2000 [IU] via INTRAVENOUS

## 2023-04-19 MED ORDER — ONDANSETRON HCL 4 MG/2ML IJ SOLN
INTRAMUSCULAR | Status: DC | PRN
  Administered 2023-04-19: 4 mg via INTRAVENOUS

## 2023-04-19 MED ORDER — FENTANYL CITRATE (PF) 100 MCG/2ML IJ SOLN
INTRAMUSCULAR | Status: AC
  Filled 2023-04-19: qty 2

## 2023-04-19 MED ORDER — ONDANSETRON HCL 4 MG/2ML IJ SOLN: 4.0000 mg | Freq: Four times a day (QID) | INTRAMUSCULAR | Status: DC | PRN

## 2023-04-19 MED ORDER — LABETALOL HCL 5 MG/ML IV SOLN: 10.0000 mg | INTRAVENOUS | Status: AC | PRN

## 2023-04-19 MED ORDER — MIDAZOLAM HCL 2 MG/2ML IJ SOLN
INTRAMUSCULAR | Status: DC | PRN
  Administered 2023-04-19: 2 mg via INTRAVENOUS
  Administered 2023-04-19: 1 mg via INTRAVENOUS

## 2023-04-19 MED ORDER — HEPARIN (PORCINE) IN NACL 1000-0.9 UT/500ML-% IV SOLN
INTRAVENOUS | Status: DC | PRN
  Administered 2023-04-19 (×2): 500 mL

## 2023-04-19 MED ORDER — SODIUM CHLORIDE 0.9 % IV SOLN: INTRAVENOUS | Status: DC

## 2023-04-19 MED ORDER — INSULIN GLARGINE-YFGN 100 UNIT/ML ~~LOC~~ SOLN
50.0000 [IU] | Freq: Every day | SUBCUTANEOUS | Status: DC
  Administered 2023-04-20: 50 [IU] via SUBCUTANEOUS
  Filled 2023-04-19: qty 0.5

## 2023-04-19 MED ORDER — POTASSIUM CHLORIDE CRYS ER 20 MEQ PO TBCR
40.0000 meq | EXTENDED_RELEASE_TABLET | Freq: Once | ORAL | Status: AC
  Administered 2023-04-19: 40 meq via ORAL
  Filled 2023-04-19: qty 2

## 2023-04-19 MED ORDER — VERAPAMIL HCL 2.5 MG/ML IV SOLN
INTRAVENOUS | Status: DC | PRN
  Administered 2023-04-19: 10 mL via INTRA_ARTERIAL

## 2023-04-19 MED ORDER — SODIUM CHLORIDE 0.9 % IV SOLN: 250.0000 mL | INTRAVENOUS | Status: DC | PRN

## 2023-04-19 MED ORDER — DIPHENHYDRAMINE HCL 25 MG PO CAPS
25.0000 mg | ORAL_CAPSULE | Freq: Four times a day (QID) | ORAL | Status: DC | PRN
  Administered 2023-04-19 – 2023-04-20 (×2): 25 mg via ORAL
  Filled 2023-04-19 (×2): qty 1

## 2023-04-19 MED ORDER — LIDOCAINE HCL (PF) 1 % IJ SOLN
INTRAMUSCULAR | Status: DC | PRN
  Administered 2023-04-19: 5 mL via INTRADERMAL

## 2023-04-19 SURGICAL SUPPLY — 12 items
CATH OPTITORQUE TIG 4.0 5F (CATHETERS) IMPLANT
CATH VISTA GUIDE 6FR XBLAD3.5 (CATHETERS) IMPLANT
DEVICE RAD COMP TR BAND LRG (VASCULAR PRODUCTS) IMPLANT
GLIDESHEATH SLEND SS 6F .021 (SHEATH) IMPLANT
GUIDEWIRE INQWIRE 1.5J.035X260 (WIRE) IMPLANT
GUIDEWIRE PRESSURE X 175 (WIRE) IMPLANT
INQWIRE 1.5J .035X260CM (WIRE) ×1
KIT ESSENTIALS PG (KITS) IMPLANT
KIT HEART LEFT (KITS) ×1 IMPLANT
PACK CARDIAC CATHETERIZATION (CUSTOM PROCEDURE TRAY) ×1 IMPLANT
TRANSDUCER W/STOPCOCK (MISCELLANEOUS) ×1 IMPLANT
TUBING CIL FLEX 10 FLL-RA (TUBING) ×1 IMPLANT

## 2023-04-19 NOTE — Plan of Care (Signed)
  Problem: Education: Goal: Knowledge of General Education information will improve Description: Including pain rating scale, medication(s)/side effects and non-pharmacologic comfort measures Outcome: Progressing   Problem: Health Behavior/Discharge Planning: Goal: Ability to manage health-related needs will improve Outcome: Progressing   Problem: Clinical Measurements: Goal: Ability to maintain clinical measurements within normal limits will improve Outcome: Progressing Goal: Will remain free from infection Outcome: Progressing Goal: Diagnostic test results will improve Outcome: Progressing Goal: Respiratory complications will improve Outcome: Progressing Goal: Cardiovascular complication will be avoided Outcome: Progressing   Problem: Activity: Goal: Risk for activity intolerance will decrease Outcome: Progressing   Problem: Nutrition: Goal: Adequate nutrition will be maintained Outcome: Progressing   Problem: Coping: Goal: Level of anxiety will decrease Outcome: Progressing   Problem: Elimination: Goal: Will not experience complications related to bowel motility Outcome: Progressing Goal: Will not experience complications related to urinary retention Outcome: Progressing   Problem: Safety: Goal: Ability to remain free from injury will improve Outcome: Progressing   Problem: Skin Integrity: Goal: Risk for impaired skin integrity will decrease Outcome: Progressing   Problem: Education: Goal: Understanding of cardiac disease, CV risk reduction, and recovery process will improve Outcome: Progressing Goal: Individualized Educational Video(s) Outcome: Progressing   Problem: Activity: Goal: Ability to tolerate increased activity will improve Outcome: Progressing   Problem: Cardiac: Goal: Ability to achieve and maintain adequate cardiovascular perfusion will improve Outcome: Progressing   Problem: Health Behavior/Discharge Planning: Goal: Ability to safely  manage health-related needs after discharge will improve Outcome: Progressing   Problem: Education: Goal: Ability to describe self-care measures that may prevent or decrease complications (Diabetes Survival Skills Education) will improve Outcome: Progressing Goal: Individualized Educational Video(s) Outcome: Progressing   Problem: Coping: Goal: Ability to adjust to condition or change in health will improve Outcome: Progressing   Problem: Fluid Volume: Goal: Ability to maintain a balanced intake and output will improve Outcome: Progressing   Problem: Health Behavior/Discharge Planning: Goal: Ability to identify and utilize available resources and services will improve Outcome: Progressing Goal: Ability to manage health-related needs will improve Outcome: Progressing   Problem: Metabolic: Goal: Ability to maintain appropriate glucose levels will improve Outcome: Progressing   Problem: Nutritional: Goal: Maintenance of adequate nutrition will improve Outcome: Progressing Goal: Progress toward achieving an optimal weight will improve Outcome: Progressing   Problem: Skin Integrity: Goal: Risk for impaired skin integrity will decrease Outcome: Progressing   Problem: Tissue Perfusion: Goal: Adequacy of tissue perfusion will improve Outcome: Progressing   Problem: Education: Goal: Understanding of CV disease, CV risk reduction, and recovery process will improve Outcome: Progressing Goal: Individualized Educational Video(s) Outcome: Progressing

## 2023-04-19 NOTE — Care Management Obs Status (Signed)
MEDICARE OBSERVATION STATUS NOTIFICATION   Patient Details  Name: [name] MRN: [id number] Date of Birth: 1961-03-30   Medicare Observation Status Notification Given:  Yes    Harriet Masson, RN 04/19/2023, 1:42 PM

## 2023-04-19 NOTE — Progress Notes (Signed)
 Rounding Note    Patient Name: [name] Date of Encounter: 04/19/2023  [facility] Cardiologist: None   Subjective   Continues to have intermittent chest pain and has had to have frequent nitroglycerin.  Nitroglycerin helps however pain when it subsides a 6 out of 10.  Plans for cardiac catheterization today.  Inpatient Medications    Scheduled Meds:  amLODipine  5 mg Oral Daily   aspirin EC  81 mg Oral Daily   DULoxetine  20 mg Oral Daily   enoxaparin (LOVENOX) injection  40 mg Subcutaneous Q24H   ezetimibe  10 mg Oral Daily   fesoterodine  4 mg Oral Daily   FLUoxetine  40 mg Oral Daily   insulin aspart  0-20 Units Subcutaneous TID WC   insulin glargine-yfgn  50 Units Subcutaneous Daily   pantoprazole  40 mg Oral BID   potassium chloride  40 mEq Oral Once   Continuous Infusions:  sodium chloride 1 mL/kg/hr (04/19/23 0557)   PRN Meds: acetaminophen, acetaminophen, cyclobenzaprine, HYDROcodone-acetaminophen, nitroGLYCERIN, ondansetron (ZOFRAN) IV, mouth rinse   Vital Signs    Vitals:   04/18/23 1914 04/18/23 2311 04/19/23 0406 04/19/23 0628  BP: 107/70 113/70 120/71   Pulse: 61 67 61 63  Resp: 14 16 13 17  Temp: 98.3 F (36.8 C) 98.3 F (36.8 C) 97.8 F (36.6 C)   TempSrc: Oral Oral Oral   SpO2: 95% 97% 96%   Weight:      Height:        Intake/Output Summary (Last 24 hours) at 04/19/2023 0742 Last data filed at 04/19/2023 0557 Gross per 24 hour  Intake 347.9 ml  Output 1000 ml  Net -652.1 ml      04/18/2023    3:19 PM 04/18/2023    7:14 AM 01/30/2023   10:33 AM  Last 3 Weights  Weight (lbs) 189 lb 6 oz 181 lb 187 lb 8 oz  Weight (kg) 85.9 kg 82.101 kg 85.049 kg      Telemetry    Normal sinus rhythm heart rate in the 60s- Personally Reviewed  ECG    No new tracings- Personally Reviewed  Physical Exam   GEN: No acute distress.   Neck: No JVD Cardiac: RRR, no murmurs, rubs, or gallops.  Respiratory: Clear to auscultation  bilaterally. GI: Soft, nontender, non-distended  MS: No edema; No deformity. Neuro:  Nonfocal  Psych: Normal affect   Labs    High Sensitivity Troponin:   Recent Labs  Lab 04/18/23 0721 04/18/23 0932  TROPONINIHS 3 3     Chemistry Recent Labs  Lab 04/18/23 0721 04/18/23 0827 04/18/23 2357  NA 140  --  138  K 2.9*  --  3.3*  CL 98  --  100  CO2 31  --  31  GLUCOSE 128*  --  112*  BUN 14  --  15  CREATININE 0.90  --  0.99  CALCIUM 9.3  --  8.9  MG  --  2.1  --   PROT 6.8  --   --   ALBUMIN 4.1  --   --   AST 17  --   --   ALT 12  --   --   ALKPHOS 71  --   --   BILITOT 0.5  --   --   GFRNONAA >60  --  >60  ANIONGAP 11  --  7    Lipids  Recent Labs  Lab 04/18/23 2357  CHOL 176    TRIG 140  HDL 44  LDLCALC 104*  CHOLHDL 4.0    Hematology Recent Labs  Lab 04/18/23 0721  WBC 8.7  RBC 4.23  HGB 13.0  HCT 37.0  MCV 87.5  MCH 30.7  MCHC 35.1  RDW 12.7  PLT 357   Thyroid  Recent Labs  Lab 04/18/23 1620  TSH 1.275    BNPNo results for input(s): "BNP", "PROBNP" in the last 168 hours.  DDimer  Recent Labs  Lab 04/18/23 0721  DDIMER <0.27     Radiology    DG Chest Port 1 View  Result Date: 04/18/2023 CLINICAL DATA:  Chest pain EXAM: PORTABLE CHEST 1 VIEW COMPARISON:  09/14/2014 FINDINGS: Normal heart size and mediastinal contours. No acute infiltrate or edema. No effusion or pneumothorax. No acute osseous findings. Artifact from EKG leads. IMPRESSION: No evidence of active disease. Electronically Signed   By: Jonathan  Watts M.D.   On: 04/18/2023 07:41    Cardiac Studies    Patient Profile     62 y.o. female  hypertension, diabetes mellitus type 2, hyperlipidemia, Barrett's esophagus, previous tobacco use, GERD currently being evaluated for chest pain.  Plans for cardiac catheterization today.  Assessment & Plan    Chest pain Patient presenting with concerning features of chest pain that mimic ACS however has had negative troponin's and no  ischemic changes on EKG.  Previous CTA in 2015 did showed mild to moderate disease in the LAD.  Cardiac catheterization today. Echo pending Continue aspirin 81 mg (has received)    Hypertension Continue amlodipine 5 mg daily. Well-controlled Stopped HCTZ due to hypokalemia.  See below.   Hyperlipidemia Continue Zetia 10.  LDL 104.  Given intolerance to statins previously, may benefit from PCSK9 inhibitor.   Hypokalemia.   Potassium 3.2.  Will given an extra dose today 40 mEq.   Tobacco use? Patient denies but other reports say she is continuing. Need to clarify   DM Managed per primary. Given NPO status today will inform primary team for any adjustments.   For questions or updates, please contact [facility] Please consult [email or web address] for contact info under        Signed, Sheng L Haley, PA-C  04/19/2023, 7:42 AM     Personally seen and examined. Agree with above.  62-year-old with diabetes former smoker mother who had CABG at age 61 with hypertension hyperlipidemia.  Ongoing intermittent chest pain.  Awaiting cardiac catheterization.  Prior CT scan showed LAD calcification.  This was a CT angiogram of the chest approximately 10 years ago.  Prior cardiac catheterization approximately 10 years ago showed no flow-limiting CAD. -On aspirin, amlodipine, Zetia. -Had trouble with statins in the past will refer to lipid clinic for PCSK9 inhibitor.  LDL 104 Hypokalemia, potassium 3.3 continuing to replete.  EKG stable. Alert and orient x 3 lungs are clear regular rate and rhythm Awaiting cardiac catheterization.  Arliss Hepburn, MD  

## 2023-04-19 NOTE — Interval H&P Note (Signed)
Cath Lab Visit (complete for each Cath Lab visit)  Clinical Evaluation Leading to the Procedure:   ACS: No.  Non-ACS:    Anginal Classification: CCS III  Anti-ischemic medical therapy: Minimal Therapy (1 class of medications)  Non-Invasive Test Results: No non-invasive testing performed  Prior CABG: No previous CABG      History and Physical Interval Note:  04/19/2023 11:51 AM  [name]  has presented today for surgery, with the diagnosis of chest pain.  The various methods of treatment have been discussed with the patient and family. After consideration of risks, benefits and other options for treatment, the patient has consented to  Procedure(s): LEFT HEART CATH AND CORONARY ANGIOGRAPHY (N/A) as a surgical intervention.  The patient's history has been reviewed, patient examined, no change in status, stable for surgery.  I have reviewed the patient's chart and labs.  Questions were answered to the patient's satisfaction.     Nicki Guadalajara

## 2023-04-19 NOTE — H&P (View-Only) (Signed)
Rounding Note    Patient Name: [name] Date of Encounter: 04/19/2023  [facility] Cardiologist: None   Subjective   Continues to have intermittent chest pain and has had to have frequent nitroglycerin.  Nitroglycerin helps however pain when it subsides a 6 out of 10.  Plans for cardiac catheterization today.  Inpatient Medications    Scheduled Meds:  amLODipine  5 mg Oral Daily   aspirin EC  81 mg Oral Daily   DULoxetine  20 mg Oral Daily   enoxaparin (LOVENOX) injection  40 mg Subcutaneous Q24H   ezetimibe  10 mg Oral Daily   fesoterodine  4 mg Oral Daily   FLUoxetine  40 mg Oral Daily   insulin aspart  0-20 Units Subcutaneous TID WC   insulin glargine-yfgn  50 Units Subcutaneous Daily   pantoprazole  40 mg Oral BID   potassium chloride  40 mEq Oral Once   Continuous Infusions:  sodium chloride 1 mL/kg/hr (04/19/23 0557)   PRN Meds: acetaminophen, acetaminophen, cyclobenzaprine, HYDROcodone-acetaminophen, nitroGLYCERIN, ondansetron (ZOFRAN) IV, mouth rinse   Vital Signs    Vitals:   04/18/23 1914 04/18/23 2311 04/19/23 0406 04/19/23 0628  BP: 107/70 113/70 120/71   Pulse: 61 67 61 63  Resp: 14 16 13 17   Temp: 98.3 F (36.8 C) 98.3 F (36.8 C) 97.8 F (36.6 C)   TempSrc: Oral Oral Oral   SpO2: 95% 97% 96%   Weight:      Height:        Intake/Output Summary (Last 24 hours) at 04/19/2023 0742 Last data filed at 04/19/2023 0557 Gross per 24 hour  Intake 347.9 ml  Output 1000 ml  Net -652.1 ml      04/18/2023    3:19 PM 04/18/2023    7:14 AM 01/30/2023   10:33 AM  Last 3 Weights  Weight (lbs) 189 lb 6 oz 181 lb 187 lb 8 oz  Weight (kg) 85.9 kg 82.101 kg 85.049 kg      Telemetry    Normal sinus rhythm heart rate in the 60s- Personally Reviewed  ECG    No new tracings- Personally Reviewed  Physical Exam   GEN: No acute distress.   Neck: No JVD Cardiac: RRR, no murmurs, rubs, or gallops.  Respiratory: Clear to auscultation  bilaterally. GI: Soft, nontender, non-distended  MS: No edema; No deformity. Neuro:  Nonfocal  Psych: Normal affect   Labs    High Sensitivity Troponin:   Recent Labs  Lab 04/18/23 0721 04/18/23 0932  TROPONINIHS 3 3     Chemistry Recent Labs  Lab 04/18/23 0721 04/18/23 0827 04/18/23 2357  NA 140  --  138  K 2.9*  --  3.3*  CL 98  --  100  CO2 31  --  31  GLUCOSE 128*  --  112*  BUN 14  --  15  CREATININE 0.90  --  0.99  CALCIUM 9.3  --  8.9  MG  --  2.1  --   PROT 6.8  --   --   ALBUMIN 4.1  --   --   AST 17  --   --   ALT 12  --   --   ALKPHOS 71  --   --   BILITOT 0.5  --   --   GFRNONAA >60  --  >60  ANIONGAP 11  --  7    Lipids  Recent Labs  Lab 04/18/23 2357  CHOL 176  TRIG 140  HDL 44  LDLCALC 104*  CHOLHDL 4.0    Hematology Recent Labs  Lab 04/18/23 0721  WBC 8.7  RBC 4.23  HGB 13.0  HCT 37.0  MCV 87.5  MCH 30.7  MCHC 35.1  RDW 12.7  PLT 357   Thyroid  Recent Labs  Lab 04/18/23 1620  TSH 1.275    BNPNo results for input(s): "BNP", "PROBNP" in the last 168 hours.  DDimer  Recent Labs  Lab 04/18/23 0721  DDIMER <0.27     Radiology    DG Chest Port 1 View  Result Date: 04/18/2023 CLINICAL DATA:  Chest pain EXAM: PORTABLE CHEST 1 VIEW COMPARISON:  09/14/2014 FINDINGS: Normal heart size and mediastinal contours. No acute infiltrate or edema. No effusion or pneumothorax. No acute osseous findings. Artifact from EKG leads. IMPRESSION: No evidence of active disease. Electronically Signed   By: Tiburcio Pea M.D.   On: 04/18/2023 07:41    Cardiac Studies    Patient Profile     62 y.o. female  hypertension, diabetes mellitus type 2, hyperlipidemia, Barrett's esophagus, previous tobacco use, GERD currently being evaluated for chest pain.  Plans for cardiac catheterization today.  Assessment & Plan    Chest pain Patient presenting with concerning features of chest pain that mimic ACS however has had negative troponin's and no  ischemic changes on EKG.  Previous CTA in 2015 did showed mild to moderate disease in the LAD.  Cardiac catheterization today. Echo pending Continue aspirin 81 mg (has received)    Hypertension Continue amlodipine 5 mg daily. Well-controlled Stopped HCTZ due to hypokalemia.  See below.   Hyperlipidemia Continue Zetia 10.  LDL 104.  Given intolerance to statins previously, may benefit from PCSK9 inhibitor.   Hypokalemia.   Potassium 3.2.  Will given an extra dose today 40 mEq.   Tobacco use? Patient denies but other reports say she is continuing. Need to clarify   DM Managed per primary. Given NPO status today will inform primary team for any adjustments.   For questions or updates, please contact [facility] Please consult [email or web address] for contact info under        Signed, Abagail Kitchens, PA-C  04/19/2023, 7:42 AM     Personally seen and examined. Agree with above.  62 year old with diabetes former smoker mother who had CABG at age 33 with hypertension hyperlipidemia.  Ongoing intermittent chest pain.  Awaiting cardiac catheterization.  Prior CT scan showed LAD calcification.  This was a CT angiogram of the chest approximately 10 years ago.  Prior cardiac catheterization approximately 10 years ago showed no flow-limiting CAD. -On aspirin, amlodipine, Zetia. -Had trouble with statins in the past will refer to lipid clinic for PCSK9 inhibitor.  LDL 104 Hypokalemia, potassium 3.3 continuing to replete.  EKG stable. Alert and orient x 3 lungs are clear regular rate and rhythm Awaiting cardiac catheterization.  Donato Schultz, MD

## 2023-04-19 NOTE — Plan of Care (Signed)
  Problem: Education: Goal: Knowledge of General Education information will improve Description: Including pain rating scale, medication(s)/side effects and non-pharmacologic comfort measures Outcome: Progressing   Problem: Health Behavior/Discharge Planning: Goal: Ability to manage health-related needs will improve Outcome: Progressing   Problem: Clinical Measurements: Goal: Ability to maintain clinical measurements within normal limits will improve Outcome: Progressing Goal: Will remain free from infection Outcome: Progressing Goal: Diagnostic test results will improve Outcome: Progressing Goal: Respiratory complications will improve Outcome: Progressing Goal: Cardiovascular complication will be avoided Outcome: Progressing   Problem: Activity: Goal: Risk for activity intolerance will decrease Outcome: Progressing   Problem: Nutrition: Goal: Adequate nutrition will be maintained Outcome: Progressing   Problem: Coping: Goal: Level of anxiety will decrease Outcome: Progressing   Problem: Elimination: Goal: Will not experience complications related to bowel motility Outcome: Progressing Goal: Will not experience complications related to urinary retention Outcome: Progressing   Problem: Pain Managment: Goal: General experience of comfort will improve Outcome: Progressing   Problem: Safety: Goal: Ability to remain free from injury will improve Outcome: Progressing   Problem: Education: Goal: Understanding of cardiac disease, CV risk reduction, and recovery process will improve Outcome: Progressing Goal: Individualized Educational Video(s) Outcome: Progressing   Problem: Activity: Goal: Ability to tolerate increased activity will improve Outcome: Progressing   Problem: Cardiac: Goal: Ability to achieve and maintain adequate cardiovascular perfusion will improve Outcome: Progressing   Problem: Health Behavior/Discharge Planning: Goal: Ability to safely manage  health-related needs after discharge will improve Outcome: Progressing   Problem: Education: Goal: Ability to describe self-care measures that may prevent or decrease complications (Diabetes Survival Skills Education) will improve Outcome: Progressing Goal: Individualized Educational Video(s) Outcome: Progressing   Problem: Coping: Goal: Ability to adjust to condition or change in health will improve Outcome: Progressing   Problem: Fluid Volume: Goal: Ability to maintain a balanced intake and output will improve Outcome: Progressing   Problem: Health Behavior/Discharge Planning: Goal: Ability to identify and utilize available resources and services will improve Outcome: Progressing Goal: Ability to manage health-related needs will improve Outcome: Progressing   Problem: Metabolic: Goal: Ability to maintain appropriate glucose levels will improve Outcome: Progressing   Problem: Nutritional: Goal: Maintenance of adequate nutrition will improve Outcome: Progressing Goal: Progress toward achieving an optimal weight will improve Outcome: Progressing   Problem: Skin Integrity: Goal: Risk for impaired skin integrity will decrease Outcome: Progressing   Problem: Tissue Perfusion: Goal: Adequacy of tissue perfusion will improve Outcome: Progressing   Problem: Education: Goal: Understanding of CV disease, CV risk reduction, and recovery process will improve Outcome: Progressing Goal: Individualized Educational Video(s) Outcome: Progressing   Problem: Activity: Goal: Ability to return to baseline activity level will improve Outcome: Progressing   Problem: Cardiovascular: Goal: Ability to achieve and maintain adequate cardiovascular perfusion will improve Outcome: Progressing Goal: Vascular access site(s) Level 0-1 will be maintained Outcome: Progressing

## 2023-04-19 NOTE — TOC CM/SW Note (Signed)
Transition of Care [facility]) - Inpatient Brief Assessment   Patient Details  Name: [name] MRN: [id number] Date of Birth: 12/31/1960  Transition of [facility]) CM/SW Contact:    Harriet Masson, RN Phone Number: 04/19/2023, 2:57 PM   Clinical Narrative:  LEFT HEART CATH AND CORONARY ANGIOGRAPHY today.  TOC following.  Transition of Care Asessment: Insurance and Status: Insurance coverage has been reviewed Patient has primary care physician: Yes Home environment has been reviewed: safe to discharge home Prior level of function:: fine at home Prior/Current Home Services: No current home services Social Determinants of Health Reivew: SDOH reviewed no interventions necessary Readmission risk has been reviewed: Yes Transition of care needs: no transition of care needs at this time

## 2023-04-19 NOTE — Progress Notes (Signed)
MD notified of small amount oozing blood and 3 cm hematoma above TR band. RN applied pressure over hematoma and held pressure 10-15 minutes. Hematoma no longer present. No longer bleeding. VSS

## 2023-04-19 NOTE — Progress Notes (Signed)
[facility] PROGRESS NOTE   [name] ZOX:[id number] DOB: Feb 21, 1961 DOA: 04/18/2023  PCP: Jamal Collin, PA-C  Brief History/Interval Summary: 62 y.o. female with medical history significant of HTN, IDDM with insulin resistance, HLD, cigarette smoking, family history of early onset CAD/CABG, anxiety/depression GERD, came with new onset of chest pain.  Patient was seen by cardiology.  Plan is for cardiac catheterization today.  Consultants: Cardiology  Procedures: Cardiac catheterization is planned for today    Subjective/Interval History: Patient complains of chest pain ongoing most of the night.  Required multiple nitroglycerin.  Denies any shortness of breath.  No nausea or vomiting.    Assessment/Plan:  Chest pain Ruled out for acute coronary syndrome but symptoms were concerning for angina.  Seen by cardiology.  Plan is for cardiac catheterization today. LDL is 104.  Previous intolerance to statin.  Cardiology plans to refer to lipid clinic.  Essential hypertension Continue HCTZ and amlodipine.  Insulin-dependent diabetes mellitus Continue glargine.  Decrease the dose as she is going to get a cath today and is NPO.  Hyperlipidemia On Zetia.  Intolerance to statin.  LDL is 104.  To be referred to the lipid clinic.  Tobacco abuse Counseled.  Obesity Estimated body mass index is 30.57 kg/m as calculated from the following:   Height as of this encounter: 5\' 6"  (1.676 m).   Weight as of this encounter: 85.9 kg.   DVT Prophylaxis: Lovenox Code Status: Full code Family Communication: Discussed with patient Disposition Plan: Hopefully home when cleared by cardiology  Status is: Observation The patient remains OBS appropriate and may or may not d/c before 2 midnights.      Medications: Scheduled:  amLODipine  5 mg Oral Daily   aspirin EC  81 mg Oral Daily   DULoxetine  20 mg Oral Daily   enoxaparin (LOVENOX) injection  40 mg Subcutaneous  Q24H   ezetimibe  10 mg Oral Daily   fesoterodine  4 mg Oral Daily   FLUoxetine  40 mg Oral Daily   insulin aspart  0-20 Units Subcutaneous TID WC   insulin glargine-yfgn  25 Units Subcutaneous Once   [START ON 04/20/2023] insulin glargine-yfgn  50 Units Subcutaneous Daily   pantoprazole  40 mg Oral BID   Continuous:  sodium chloride 1 mL/kg/hr (04/19/23 0557)   WJX:[id number], acetaminophen, cyclobenzaprine, HYDROcodone-acetaminophen, nitroGLYCERIN, ondansetron (ZOFRAN) IV, mouth rinse  Antibiotics: Anti-infectives (From admission, onward)    None       Objective:  Vital Signs  Vitals:   04/18/23 2311 04/19/23 0406 04/19/23 0628 04/19/23 0800  BP: 113/70 120/71  119/66  Pulse: 67 61 63 62  Resp: 16 13 17 15   Temp: 98.3 F (36.8 C) 97.8 F (36.6 C)  97.9 F (36.6 C)  TempSrc: Oral Oral  Oral  SpO2: 97% 96%    Weight:      Height:        Intake/Output Summary (Last 24 hours) at 04/19/2023 1002 Last data filed at 04/19/2023 0557 Gross per 24 hour  Intake 347.9 ml  Output 1000 ml  Net -652.1 ml   Filed Weights   04/18/23 0714 04/18/23 1519  Weight: 82.1 kg 85.9 kg    General appearance: Awake alert.  In no distress Resp: Clear to auscultation bilaterally.  Normal effort Cardio: S1-S2 is normal regular.  No S3-S4.  No rubs murmurs or bruit GI: Abdomen is soft.  Nontender nondistended.  Bowel sounds are present normal.  No masses organomegaly Extremities: No edema.  Full range of motion of lower extremities. Neurologic: Alert and oriented x3.  No focal neurological deficits.    Lab Results:  Data Reviewed: I have personally reviewed following labs and reports of the imaging studies  CBC: Recent Labs  Lab 04/18/23 0721  WBC 8.7  HGB 13.0  HCT 37.0  MCV 87.5  PLT 357    Basic Metabolic Panel: Recent Labs  Lab 04/18/23 0721 04/18/23 0827 04/18/23 2357 04/19/23 0735  NA 140  --  138 137  K 2.9*  --  3.3* 4.2  CL 98  --  100 101  CO2 31  --   31 28  GLUCOSE 128*  --  112* 109*  BUN 14  --  15 15  CREATININE 0.90  --  0.99 0.80  CALCIUM 9.3  --  8.9 8.7*  MG  --  2.1  --   --     GFR: Estimated Creatinine Clearance: 81.5 mL/min (by C-G formula based on SCr of 0.8 mg/dL).  Liver Function Tests: Recent Labs  Lab 04/18/23 0721  AST 17  ALT 12  ALKPHOS 71  BILITOT 0.5  PROT 6.8  ALBUMIN 4.1    HbA1C: Recent Labs    04/18/23 1617  HGBA1C 5.7*    CBG: Recent Labs  Lab 04/18/23 1216 04/18/23 1639 04/18/23 2127 04/19/23 0615  GLUCAP 99 96 129* 104*    Lipid Profile: Recent Labs    04/18/23 2357  CHOL 176  HDL 44  LDLCALC 104*  TRIG 140  CHOLHDL 4.0    Thyroid Function Tests: Recent Labs    04/18/23 1620  TSH 1.275    Recent Results (from the past 240 hour(s))  MRSA Next Gen by PCR, Nasal     Status: None   Collection Time: 04/18/23  3:27 PM   Specimen: Nasal Mucosa; Nasal Swab  Result Value Ref Range Status   MRSA by PCR Next Gen NOT DETECTED NOT DETECTED Final    Comment: (NOTE) The GeneXpert MRSA Assay (FDA approved for NASAL specimens only), is one component of a comprehensive MRSA colonization surveillance program. It is not intended to diagnose MRSA infection nor to guide or monitor treatment for MRSA infections. Test performance is not FDA approved in patients less than 74 years old. Performed at [facility], [address], [place], [place] [address]       Radiology Studies: DG Chest Port 1 View  Result Date: 04/18/2023 CLINICAL DATA:  Chest pain EXAM: PORTABLE CHEST 1 VIEW COMPARISON:  09/14/2014 FINDINGS: Normal heart size and mediastinal contours. No acute infiltrate or edema. No effusion or pneumothorax. No acute osseous findings. Artifact from EKG leads. IMPRESSION: No evidence of active disease. Electronically Signed   By: Tiburcio Pea M.D.   On: 04/18/2023 07:41       LOS: 0 days   Asencion Guisinger Rito Ehrlich  [facility] Pager on [email or web address]  04/19/2023,  10:02 AM

## 2023-04-19 NOTE — Progress Notes (Signed)
Echocardiogram 2D Echocardiogram has been performed.  Lucendia Herrlich 04/19/2023, 10:20 AM

## 2023-04-20 ENCOUNTER — Encounter (HOSPITAL_COMMUNITY): Payer: Self-pay | Admitting: Cardiovascular Disease

## 2023-04-20 ENCOUNTER — Other Ambulatory Visit: Payer: Self-pay | Admitting: Cardiology

## 2023-04-20 DIAGNOSIS — Z7982 Long term (current) use of aspirin: Secondary | ICD-10-CM | POA: Diagnosis not present

## 2023-04-20 DIAGNOSIS — Z79899 Other long term (current) drug therapy: Secondary | ICD-10-CM | POA: Diagnosis not present

## 2023-04-20 DIAGNOSIS — K2270 Barrett's esophagus without dysplasia: Secondary | ICD-10-CM | POA: Diagnosis present

## 2023-04-20 DIAGNOSIS — E785 Hyperlipidemia, unspecified: Secondary | ICD-10-CM | POA: Diagnosis not present

## 2023-04-20 DIAGNOSIS — R072 Precordial pain: Secondary | ICD-10-CM | POA: Diagnosis not present

## 2023-04-20 DIAGNOSIS — E88819 Insulin resistance, unspecified: Secondary | ICD-10-CM | POA: Diagnosis not present

## 2023-04-20 DIAGNOSIS — Z7985 Long-term (current) use of injectable non-insulin antidiabetic drugs: Secondary | ICD-10-CM | POA: Diagnosis not present

## 2023-04-20 DIAGNOSIS — I25118 Atherosclerotic heart disease of native coronary artery with other forms of angina pectoris: Secondary | ICD-10-CM

## 2023-04-20 DIAGNOSIS — R031 Nonspecific low blood-pressure reading: Secondary | ICD-10-CM | POA: Diagnosis not present

## 2023-04-20 DIAGNOSIS — Z981 Arthrodesis status: Secondary | ICD-10-CM | POA: Diagnosis not present

## 2023-04-20 DIAGNOSIS — Z841 Family history of disorders of kidney and ureter: Secondary | ICD-10-CM | POA: Diagnosis not present

## 2023-04-20 DIAGNOSIS — Z794 Long term (current) use of insulin: Secondary | ICD-10-CM | POA: Diagnosis not present

## 2023-04-20 DIAGNOSIS — K219 Gastro-esophageal reflux disease without esophagitis: Secondary | ICD-10-CM | POA: Diagnosis present

## 2023-04-20 DIAGNOSIS — E669 Obesity, unspecified: Secondary | ICD-10-CM | POA: Diagnosis not present

## 2023-04-20 DIAGNOSIS — Z8249 Family history of ischemic heart disease and other diseases of the circulatory system: Secondary | ICD-10-CM | POA: Diagnosis not present

## 2023-04-20 DIAGNOSIS — F17210 Nicotine dependence, cigarettes, uncomplicated: Secondary | ICD-10-CM | POA: Diagnosis not present

## 2023-04-20 DIAGNOSIS — E876 Hypokalemia: Secondary | ICD-10-CM | POA: Diagnosis not present

## 2023-04-20 DIAGNOSIS — Z85828 Personal history of other malignant neoplasm of skin: Secondary | ICD-10-CM | POA: Diagnosis not present

## 2023-04-20 DIAGNOSIS — Z6830 Body mass index (BMI) 30.0-30.9, adult: Secondary | ICD-10-CM | POA: Diagnosis not present

## 2023-04-20 DIAGNOSIS — I10 Essential (primary) hypertension: Secondary | ICD-10-CM | POA: Diagnosis not present

## 2023-04-20 DIAGNOSIS — Z825 Family history of asthma and other chronic lower respiratory diseases: Secondary | ICD-10-CM | POA: Diagnosis not present

## 2023-04-20 DIAGNOSIS — E119 Type 2 diabetes mellitus without complications: Secondary | ICD-10-CM | POA: Diagnosis not present

## 2023-04-20 DIAGNOSIS — Z96651 Presence of right artificial knee joint: Secondary | ICD-10-CM | POA: Diagnosis not present

## 2023-04-20 DIAGNOSIS — F32A Depression, unspecified: Secondary | ICD-10-CM | POA: Diagnosis not present

## 2023-04-20 DIAGNOSIS — I25119 Atherosclerotic heart disease of native coronary artery with unspecified angina pectoris: Secondary | ICD-10-CM | POA: Diagnosis not present

## 2023-04-20 DIAGNOSIS — I2510 Atherosclerotic heart disease of native coronary artery without angina pectoris: Secondary | ICD-10-CM | POA: Insufficient documentation

## 2023-04-20 DIAGNOSIS — T502X5A Adverse effect of carbonic-anhydrase inhibitors, benzothiadiazides and other diuretics, initial encounter: Secondary | ICD-10-CM | POA: Diagnosis present

## 2023-04-20 LAB — BASIC METABOLIC PANEL
Anion gap: 7 (ref 5–15)
BUN: 13 mg/dL (ref 8–23)
CO2: 26 mmol/L (ref 22–32)
Calcium: 8.5 mg/dL — ABNORMAL LOW (ref 8.9–10.3)
Chloride: 104 mmol/L (ref 98–111)
Creatinine, Ser: 0.82 mg/dL (ref 0.44–1.00)
GFR, Estimated: >60 mL/min (ref >60)
Glucose, Bld: 115 mg/dL — ABNORMAL HIGH (ref 70–99)
Potassium: 3.8 mmol/L (ref 3.5–5.1)
Sodium: 137 mmol/L (ref 135–145)

## 2023-04-20 LAB — CBC
HCT: 33.5 % — ABNORMAL LOW (ref 36.0–46.0)
Hemoglobin: 11.3 g/dL — ABNORMAL LOW (ref 12.0–15.0)
MCH: 30.9 pg (ref 26.0–34.0)
MCHC: 33.7 g/dL (ref 30.0–36.0)
MCV: 91.5 fL (ref 80.0–100.0)
Platelets: 293 10*3/uL (ref 150–400)
RBC: 3.66 MIL/uL — ABNORMAL LOW (ref 3.87–5.11)
RDW: 12.6 % (ref 11.5–15.5)
WBC: 8.1 10*3/uL (ref 4.0–10.5)
nRBC: 0 % (ref 0.0–0.2)

## 2023-04-20 LAB — GLUCOSE, CAPILLARY
Glucose-Capillary: 142 mg/dL — ABNORMAL HIGH (ref 70–99)
Glucose-Capillary: 103 mg/dL — ABNORMAL HIGH (ref 70–99)

## 2023-04-20 MED ORDER — ISOSORBIDE MONONITRATE ER 30 MG PO TB24: 30.0000 mg | ORAL_TABLET | Freq: Every day | ORAL | 2 refills | Status: DC

## 2023-04-20 MED ORDER — METOPROLOL SUCCINATE ER 25 MG PO TB24: 25.0000 mg | ORAL_TABLET | Freq: Every day | ORAL | 2 refills | Status: DC

## 2023-04-20 MED ORDER — ASPIRIN 81 MG PO TBEC: 81.0000 mg | DELAYED_RELEASE_TABLET | Freq: Every day | ORAL | 12 refills | Status: AC

## 2023-04-20 NOTE — Progress Notes (Signed)
Will email clinic schedulers to help set up lipid clinic referral.

## 2023-04-20 NOTE — Progress Notes (Addendum)
Rounding Note    Patient Name: [name] Date of Encounter: 04/20/2023  [facility] Cardiologist: None   Subjective   Has no complaints today.  Chest pain has decreased intensity is now more tolerable.  Inpatient Medications    Scheduled Meds:  amLODipine  5 mg Oral Daily   aspirin EC  81 mg Oral Daily   DULoxetine  20 mg Oral Daily   enoxaparin (LOVENOX) injection  40 mg Subcutaneous Q24H   ezetimibe  10 mg Oral Daily   fesoterodine  4 mg Oral Daily   FLUoxetine  40 mg Oral Daily   insulin aspart  0-20 Units Subcutaneous TID WC   insulin glargine-yfgn  50 Units Subcutaneous Daily   isosorbide mononitrate  30 mg Oral Daily   metoprolol succinate  25 mg Oral Daily   pantoprazole  40 mg Oral BID   sodium chloride flush  3 mL Intravenous Q12H   Continuous Infusions:  sodium chloride 100 mL/hr at 04/19/23 1841   sodium chloride     PRN Meds: sodium chloride, acetaminophen, acetaminophen, cyclobenzaprine, diphenhydrAMINE, HYDROcodone-acetaminophen, nitroGLYCERIN, ondansetron (ZOFRAN) IV, mouth rinse, sodium chloride flush   Vital Signs    Vitals:   04/19/23 1645 04/19/23 1930 04/19/23 2320 04/20/23 0343  BP: 114/79 100/66 100/66 94/62  Pulse:  63 (!) 58 65  Resp:  11 14 15   Temp:  98.6 F (37 C) 98 F (36.7 C) 97.8 F (36.6 C)  TempSrc:  Oral Oral Oral  SpO2:  97% 95% 97%  Weight:      Height:        Intake/Output Summary (Last 24 hours) at 04/20/2023 0733 Last data filed at 04/19/2023 1500 Gross per 24 hour  Intake --  Output 500 ml  Net -500 ml       04/18/2023    3:19 PM 04/18/2023    7:14 AM 01/30/2023   10:33 AM  Last 3 Weights  Weight (lbs) 189 lb 6 oz 181 lb 187 lb 8 oz  Weight (kg) 85.9 kg 82.101 kg 85.049 kg      Telemetry    Normal sinus rhythm heart rate in the 60s- Personally Reviewed  ECG    No new tracings- Personally Reviewed  Physical Exam   GEN: No acute distress.   Neck: No JVD Cardiac: RRR, no murmurs,  rubs, or gallops.  Respiratory: Clear to auscultation bilaterally. GI: Soft, nontender, non-distended  MS: No edema; No deformity.  Right radial cath site evaluated: free of discharge, redness, tenderness. Neuro:  Nonfocal  Psych: Normal affect   Labs    High Sensitivity Troponin:   Recent Labs  Lab 04/18/23 0721 04/18/23 0932 04/19/23 0735  TROPONINIHS 3 3 3       Chemistry Recent Labs  Lab 04/18/23 0721 04/18/23 0827 04/18/23 2357 04/19/23 0735 04/20/23 0036  NA 140  --  138 137 137  K 2.9*  --  3.3* 4.2 3.8  CL 98  --  100 101 104  CO2 31  --  31 28 26   GLUCOSE 128*  --  112* 109* 115*  BUN 14  --  15 15 13   CREATININE 0.90  --  0.99 0.80 0.82  CALCIUM 9.3  --  8.9 8.7* 8.5*  MG  --  2.1  --   --   --   PROT 6.8  --   --   --   --   ALBUMIN 4.1  --   --   --   --  AST 17  --   --   --   --   ALT 12  --   --   --   --   ALKPHOS 71  --   --   --   --   BILITOT 0.5  --   --   --   --   GFRNONAA >60  --  >60 >60 >60  ANIONGAP 11  --  7 8 7      Lipids  Recent Labs  Lab 04/18/23 2357  CHOL 176  TRIG 140  HDL 44  LDLCALC 104*  CHOLHDL 4.0     Hematology Recent Labs  Lab 04/18/23 0721 04/19/23 0735 04/20/23 0036  WBC 8.7 6.7 8.1  RBC 4.23 3.95 3.66*  HGB 13.0 11.7* 11.3*  HCT 37.0 35.7* 33.5*  MCV 87.5 90.4 91.5  MCH 30.7 29.6 30.9  MCHC 35.1 32.8 33.7  RDW 12.7 12.6 12.6  PLT 357 298 293    Thyroid  Recent Labs  Lab 04/18/23 1620  TSH 1.275     BNPNo results for input(s): "BNP", "PROBNP" in the last 168 hours.  DDimer  Recent Labs  Lab 04/18/23 0721  DDIMER <0.27      Radiology    CARDIAC CATHETERIZATION  Result Date: 04/19/2023   Ramus lesion is 70% stenosed.   Ost LM to Mid LM lesion is 5% stenosed.   Ost LAD to Prox LAD lesion is 50% stenosed.   Recommend Aspirin 81mg  daily for moderate CAD. There is evidence for mild to moderate diffuse coronary calcification. Left main has mild 5% mid narrowing and trifurcates into the LAD,  a moderate size ramus intermediate vessel, and left circumflex coronary artery. LAD is calcified proximally with stenosis of 50% in the very proximal bend of the vessel just after the ramus take-off. Ramus intermediate vessel has 70% eccentric stenosis ostially. The left circumflex coronary artery is free of obstructive disease. The RCA is a small caliber normal vessel. LVEDP 9 mmHg. Abbott pressure wire X RFR flow analysis of the LAD was 0.95 arguing against hemodynamically significant stenosis. RECOMMENDATION: Since the LAD proximal stenosis was not hemodynamically significant, plan initial medical therapy trial for the eccentric ostial ramus intermediate stenosis.  The patient's troponins are negative and ECG normal.  Echo reveals normal LV function without wall motion abnormality.  She has been on low-dose amlodipine 5 mg will initiate isosorbide 30 mg and metoprolol succinate initially at 25 mg.  Further titrate amlodipine depending upon clinical response.  Patient will need aggressive lipid-lowering therapy.  She has only been on Zetia and apparently cannot tolerate statins.  Will need to initiate PCSK9 inhibition with target LDL less than 55.   ECHOCARDIOGRAM COMPLETE  Result Date: 04/19/2023    ECHOCARDIOGRAM REPORT   Patient Name:   [name] Date of Exam: 04/19/2023 Medical Rec #:  [id number]        Height:       66.0 in Accession #:    [id number]       Weight:       189.4 lb Date of Birth:  [birth year]        BSA:          1.954 m Patient Age:    62 years         BP:           120/71 mmHg Patient Gender: F                HR:  64 bpm. Exam Location:  Inpatient Procedure: 2D Echo, Cardiac Doppler and Color Doppler Indications:    Chest Pain R07.9  History:        Patient has no prior history of Echocardiogram examinations.                 Signs/Symptoms:Chest Pain; Risk Factors:Hypertension, Diabetes,                 Current Smoker and Dyslipidemia.  Sonographer:    Lucendia Herrlich  Referring Phys: [id number] PING T ZHANG IMPRESSIONS  1. Left ventricular ejection fraction, by estimation, is 60 to 65%. The left ventricle has normal function. The left ventricle has no regional wall motion abnormalities. Left ventricular diastolic parameters were normal.  2. Right ventricular systolic function is normal. The right ventricular size is normal. Tricuspid regurgitation signal is inadequate for assessing PA pressure.  3. The mitral valve is normal in structure. No evidence of mitral valve regurgitation. No evidence of mitral stenosis.  4. The aortic valve is normal in structure. Aortic valve regurgitation is not visualized. No aortic stenosis is present.  5. The inferior vena cava is normal in size with greater than 50% respiratory variability, suggesting right atrial pressure of 3 mmHg. FINDINGS  Left Ventricle: Left ventricular ejection fraction, by estimation, is 60 to 65%. The left ventricle has normal function. The left ventricle has no regional wall motion abnormalities. The left ventricular internal cavity size was normal in size. There is  no left ventricular hypertrophy. Left ventricular diastolic parameters were normal. Normal left ventricular filling pressure. Right Ventricle: The right ventricular size is normal. No increase in right ventricular wall thickness. Right ventricular systolic function is normal. Tricuspid regurgitation signal is inadequate for assessing PA pressure. Left Atrium: Left atrial size was normal in size. Right Atrium: Right atrial size was normal in size. Pericardium: There is no evidence of pericardial effusion. Mitral Valve: The mitral valve is normal in structure. Mild mitral annular calcification. No evidence of mitral valve regurgitation. No evidence of mitral valve stenosis. Tricuspid Valve: The tricuspid valve is normal in structure. Tricuspid valve regurgitation is not demonstrated. No evidence of tricuspid stenosis. Aortic Valve: The aortic valve is normal in  structure. Aortic valve regurgitation is not visualized. No aortic stenosis is present. Aortic valve peak gradient measures 7.0 mmHg. Pulmonic Valve: The pulmonic valve was normal in structure. Pulmonic valve regurgitation is not visualized. No evidence of pulmonic stenosis. Aorta: The aortic root is normal in size and structure. Venous: The inferior vena cava is normal in size with greater than 50% respiratory variability, suggesting right atrial pressure of 3 mmHg. IAS/Shunts: No atrial level shunt detected by color flow Doppler.  LEFT VENTRICLE PLAX 2D LVIDd:         4.00 cm   Diastology LVIDs:         2.50 cm   LV e' medial:    8.24 cm/s LV PW:         0.80 cm   LV E/e' medial:  10.1 LV IVS:        0.80 cm   LV e' lateral:   10.70 cm/s LVOT diam:     2.20 cm   LV E/e' lateral: 7.8 LV SV:         79 LV SV Index:   40 LVOT Area:     3.80 cm  RIGHT VENTRICLE             IVC RV S prime:  15.10 cm/s  IVC diam: 1.30 cm TAPSE (M-mode): 2.4 cm LEFT ATRIUM             Index        RIGHT ATRIUM          Index LA diam:        3.70 cm 1.89 cm/m   RA Area:     8.81 cm LA Vol (A2C):   34.0 ml 17.40 ml/m  RA Volume:   15.70 ml 8.04 ml/m LA Vol (A4C):   31.0 ml 15.87 ml/m LA Biplane Vol: 35.0 ml 17.91 ml/m  AORTIC VALVE AV Area (Vmax): 2.64 cm AV Vmax:        132.00 cm/s AV Peak Grad:   7.0 mmHg LVOT Vmax:      91.50 cm/s LVOT Vmean:     59.800 cm/s LVOT VTI:       0.207 m  AORTA Ao Root diam: 3.20 cm Ao Asc diam:  3.20 cm MITRAL VALVE MV Area (PHT): 3.53 cm     SHUNTS MV Decel Time: 215 msec     Systemic VTI:  0.21 m MV E velocity: 83.10 cm/s   Systemic Diam: 2.20 cm MV A velocity: 105.00 cm/s MV E/A ratio:  0.79 Armanda Magic MD Electronically signed by Armanda Magic MD Signature Date/Time: 04/19/2023/10:37:43 AM    Final    DG Chest Port 1 View  Result Date: 04/18/2023 CLINICAL DATA:  Chest pain EXAM: PORTABLE CHEST 1 VIEW COMPARISON:  09/14/2014 FINDINGS: Normal heart size and mediastinal contours. No acute  infiltrate or edema. No effusion or pneumothorax. No acute osseous findings. Artifact from EKG leads. IMPRESSION: No evidence of active disease. Electronically Signed   By: Tiburcio Pea M.D.   On: 04/18/2023 07:41    Cardiac Studies    Patient Profile     62 y.o. female  hypertension, diabetes mellitus type 2, hyperlipidemia, Barrett's esophagus, previous tobacco use, GERD currently being evaluated for chest pain.  Plans for cardiac catheterization today.  Assessment & Plan    Chest pain Mild to moderate non obstructing CAD Patient presenting with concerning features of chest pain that mimic ACS however has had negative troponin's and no ischemic changes on EKG.  Previous CTA in 2015 did showed mild to moderate nonobstructing disease in the LAD.   Cardiac catheterization showed mild to moderate diffuse calcifications. LAD had 50% stenosis and the ramus intermediate vessel had 70% eccentric stenosis ostially. Normal LVEDP. Plan for medical management with up titration of anti anginal's.  Blood pressure has decreased slightly and is around 90 systolic, however this is not an accurate depiction of patient and his normal functional status since being bedridden here in the hospital..  Patient asymptomatic.  Given minimal symptoms and now better tolerance with chest pain I think it is reasonable to continue medical therapy for now and continue to follow blood pressure and symptoms closely. Echo showed normal LVEF function 60-65% Continue amlodipine 5mg  daily  Starting imur 30mg  and Toprol XL 25mg  daily Continue aspirin  Will schedule follow-up.    Hypertension Continue amlodipine 5 mg daily. Well-controlled. See above.  Stopped HCTZ due to hypokalemia.    Hyperlipidemia Continue Zetia 10.  LDL 104.  Given intolerance to statins previously, plan for PCSK9 inhibitor. Goal <55.  Patient believes she has been on this previously and tolerated well.   Hypokalemia.   Potassium has now normalized  after supplementation and stopping of HCTZ   Tobacco use Prior notes show conflicting information.  Patient DOES NOT smoke and has not for several years.  DM Managed per primary.   For questions or updates, please contact [facility] Please consult [email or web address] for contact info under        Signed, Abagail Kitchens, PA-C  04/20/2023, 7:33 AM      Personally seen and examined. Agree with above.   Coronary artery disease - Cardiac catheterization with 70% ramus branch stenosis, 50% ostial LAD stenosis, FloWire negative.  Nonobstructive disease. -We will treat medically.  We have added isosorbide 30 mg and low-dose metoprolol 25 mg.  Currently on amlodipine 5 mg a day as well.  Blood pressure has been fairly soft.  May not be able to tolerate.  Because of this, I will go ahead and stop the amlodipine 5 mg a day.  Her husband has blood pressure cuff at home.  They will check.  She is asymptomatic with her systolics in the 90s. -Normal echocardiogram.  Statin intolerant hyperlipidemia - On Zetia 10 mg.  We will refer to lipid clinic for PCSK9 inhibitor. -LDL goal less than 50, currently 104  Diabetes mellitus - Currently stable, per primary team.  Okay with discharge from cardiology perspective.  We will continue with outpatient follow-up.  Donato Schultz, MD

## 2023-04-20 NOTE — Discharge Summary (Signed)
[facility]  Physician Discharge Summary   Patient ID: [name] MRN: [id number] DOB/AGE: 03-15-1961 62 y.o.  Admit date: 04/18/2023 Discharge date:   04/20/2023   PCP: Jamal Collin, PA-C  DISCHARGE DIAGNOSES:    Chest pain   Controlled type 2 diabetes mellitus without complication, with long-term current use of insulin (HCC)   OBESITY   SMOKER   Mild to Moderate Nonobstructing CAD   RECOMMENDATIONS FOR OUTPATIENT FOLLOW UP: Cardiology to arrange outpatient follow-up   Home Health: None Equipment/Devices: None  CODE STATUS: Full code  DISCHARGE CONDITION: fair  Diet recommendation: Heart healthy  INITIAL HISTORY:  62 y.o. female with medical history significant of HTN, IDDM with insulin resistance, HLD, cigarette smoking, family history of early onset CAD/CABG, anxiety/depression GERD, came with new onset of chest pain.  Patient was seen by cardiology.  Plan is for cardiac catheterization today.   Consultants: Cardiology   Procedures: Cardiac catheterization.  Echocardiogram   HOSPITAL COURSE:   Mild to moderate coronary artery disease/chest pain Presented with chest pain.  Pain radiated to her jaw as well as left arm.  She ruled out for ACS.  Seen by cardiology.  Underwent cardiac catheterization which showed mild to moderate CAD.  No interventions were performed.  Patient was started on nitrates.  Did experience a drop in blood pressure.  Amlodipine and HCTZ discontinued.  Patient wants to go home.  Cleared by cardiology.  Okay for discharge.  Continue with aspirin and beta-blocker.     Essential hypertension HCTZ amlodipine discontinued.  Patient started on beta-blocker and nitrates.  Low blood pressure noted.  This was addressed by cardiology with patient.  She will check her blood pressures at home.     Insulin-dependent diabetes mellitus Continue with home medication regimen.     Hyperlipidemia On Zetia.  Intolerance to statin.  LDL is  104.  To be referred to the lipid clinic.   Tobacco abuse Counseled.   Obesity Estimated body mass index is 30.57 kg/m as calculated from the following:   Height as of this encounter: 5\' 6"  (1.676 m).   Weight as of this encounter: 85.9 kg.   Patient is stable.  Okay for discharge home today.   PERTINENT LABS:  The results of significant diagnostics from this hospitalization (including imaging, microbiology, ancillary and laboratory) are listed below for reference.    Microbiology: Recent Results (from the past 240 hour(s))  MRSA Next Gen by PCR, Nasal     Status: None   Collection Time: 04/18/23  3:27 PM   Specimen: Nasal Mucosa; Nasal Swab  Result Value Ref Range Status   MRSA by PCR Next Gen NOT DETECTED NOT DETECTED Final    Comment: (NOTE) The GeneXpert MRSA Assay (FDA approved for NASAL specimens only), is one component of a comprehensive MRSA colonization surveillance program. It is not intended to diagnose MRSA infection nor to guide or monitor treatment for MRSA infections. Test performance is not FDA approved in patients less than 84 years old. Performed at [facility], [address], [place], [place] [address]      Labs:   Basic Metabolic Panel: Recent Labs  Lab 04/18/23 0721 04/18/23 0827 04/18/23 2357 04/19/23 0735 04/20/23 0036  NA 140  --  138 137 137  K 2.9*  --  3.3* 4.2 3.8  CL 98  --  100 101 104  CO2 31  --  31 28 26   GLUCOSE 128*  --  112* 109* 115*  BUN 14  --  15 15 13   CREATININE 0.90  --  0.99 0.80 0.82  CALCIUM 9.3  --  8.9 8.7* 8.5*  MG  --  2.1  --   --   --    Liver Function Tests: Recent Labs  Lab 04/18/23 0721  AST 17  ALT 12  ALKPHOS 71  BILITOT 0.5  PROT 6.8  ALBUMIN 4.1    CBC: Recent Labs  Lab 04/18/23 0721 04/19/23 0735 04/20/23 0036  WBC 8.7 6.7 8.1  HGB 13.0 11.7* 11.3*  HCT 37.0 35.7* 33.5*  MCV 87.5 90.4 91.5  PLT 357 298 293    CBG: Recent Labs  Lab 04/19/23 0615 04/19/23 1101  04/19/23 1602 04/19/23 2122 04/20/23 0608  GLUCAP 104* 78 87 142* 103*     IMAGING STUDIES CARDIAC CATHETERIZATION  Result Date: 04/19/2023   Ramus lesion is 70% stenosed.   Ost LM to Mid LM lesion is 5% stenosed.   Ost LAD to Prox LAD lesion is 50% stenosed.   Recommend Aspirin 81mg  daily for moderate CAD. There is evidence for mild to moderate diffuse coronary calcification. Left main has mild 5% mid narrowing and trifurcates into the LAD, a moderate size ramus intermediate vessel, and left circumflex coronary artery. LAD is calcified proximally with stenosis of 50% in the very proximal bend of the vessel just after the ramus take-off. Ramus intermediate vessel has 70% eccentric stenosis ostially. The left circumflex coronary artery is free of obstructive disease. The RCA is a small caliber normal vessel. LVEDP 9 mmHg. Abbott pressure wire X RFR flow analysis of the LAD was 0.95 arguing against hemodynamically significant stenosis. RECOMMENDATION: Since the LAD proximal stenosis was not hemodynamically significant, plan initial medical therapy trial for the eccentric ostial ramus intermediate stenosis.  The patient's troponins are negative and ECG normal.  Echo reveals normal LV function without wall motion abnormality.  She has been on low-dose amlodipine 5 mg will initiate isosorbide 30 mg and metoprolol succinate initially at 25 mg.  Further titrate amlodipine depending upon clinical response.  Patient will need aggressive lipid-lowering therapy.  She has only been on Zetia and apparently cannot tolerate statins.  Will need to initiate PCSK9 inhibition with target LDL less than 55.   ECHOCARDIOGRAM COMPLETE  Result Date: 04/19/2023    ECHOCARDIOGRAM REPORT   Patient Name:   [name] Date of Exam: 04/19/2023 Medical Rec #:  [id number]        Height:       66.0 in Accession #:    [id number]       Weight:       189.4 lb Date of Birth:  04-21-1961        BSA:          1.954 m Patient Age:    62  years         BP:           120/71 mmHg Patient Gender: F                HR:           64 bpm. Exam Location:  Inpatient Procedure: 2D Echo, Cardiac Doppler and Color Doppler Indications:    Chest Pain R07.9  History:        Patient has no prior history of Echocardiogram examinations.                 Signs/Symptoms:Chest Pain; Risk Factors:Hypertension, Diabetes,  Current Smoker and Dyslipidemia.  Sonographer:    Lucendia Herrlich Referring Phys: [id number] PING T ZHANG IMPRESSIONS  1. Left ventricular ejection fraction, by estimation, is 60 to 65%. The left ventricle has normal function. The left ventricle has no regional wall motion abnormalities. Left ventricular diastolic parameters were normal.  2. Right ventricular systolic function is normal. The right ventricular size is normal. Tricuspid regurgitation signal is inadequate for assessing PA pressure.  3. The mitral valve is normal in structure. No evidence of mitral valve regurgitation. No evidence of mitral stenosis.  4. The aortic valve is normal in structure. Aortic valve regurgitation is not visualized. No aortic stenosis is present.  5. The inferior vena cava is normal in size with greater than 50% respiratory variability, suggesting right atrial pressure of 3 mmHg. FINDINGS  Left Ventricle: Left ventricular ejection fraction, by estimation, is 60 to 65%. The left ventricle has normal function. The left ventricle has no regional wall motion abnormalities. The left ventricular internal cavity size was normal in size. There is  no left ventricular hypertrophy. Left ventricular diastolic parameters were normal. Normal left ventricular filling pressure. Right Ventricle: The right ventricular size is normal. No increase in right ventricular wall thickness. Right ventricular systolic function is normal. Tricuspid regurgitation signal is inadequate for assessing PA pressure. Left Atrium: Left atrial size was normal in size. Right Atrium: Right atrial  size was normal in size. Pericardium: There is no evidence of pericardial effusion. Mitral Valve: The mitral valve is normal in structure. Mild mitral annular calcification. No evidence of mitral valve regurgitation. No evidence of mitral valve stenosis. Tricuspid Valve: The tricuspid valve is normal in structure. Tricuspid valve regurgitation is not demonstrated. No evidence of tricuspid stenosis. Aortic Valve: The aortic valve is normal in structure. Aortic valve regurgitation is not visualized. No aortic stenosis is present. Aortic valve peak gradient measures 7.0 mmHg. Pulmonic Valve: The pulmonic valve was normal in structure. Pulmonic valve regurgitation is not visualized. No evidence of pulmonic stenosis. Aorta: The aortic root is normal in size and structure. Venous: The inferior vena cava is normal in size with greater than 50% respiratory variability, suggesting right atrial pressure of 3 mmHg. IAS/Shunts: No atrial level shunt detected by color flow Doppler.  LEFT VENTRICLE PLAX 2D LVIDd:         4.00 cm   Diastology LVIDs:         2.50 cm   LV e' medial:    8.24 cm/s LV PW:         0.80 cm   LV E/e' medial:  10.1 LV IVS:        0.80 cm   LV e' lateral:   10.70 cm/s LVOT diam:     2.20 cm   LV E/e' lateral: 7.8 LV SV:         79 LV SV Index:   40 LVOT Area:     3.80 cm  RIGHT VENTRICLE             IVC RV S prime:     15.10 cm/s  IVC diam: 1.30 cm TAPSE (M-mode): 2.4 cm LEFT ATRIUM             Index        RIGHT ATRIUM          Index LA diam:        3.70 cm 1.89 cm/m   RA Area:     8.81 cm LA Vol (A2C):   34.0  ml 17.40 ml/m  RA Volume:   15.70 ml 8.04 ml/m LA Vol (A4C):   31.0 ml 15.87 ml/m LA Biplane Vol: 35.0 ml 17.91 ml/m  AORTIC VALVE AV Area (Vmax): 2.64 cm AV Vmax:        132.00 cm/s AV Peak Grad:   7.0 mmHg LVOT Vmax:      91.50 cm/s LVOT Vmean:     59.800 cm/s LVOT VTI:       0.207 m  AORTA Ao Root diam: 3.20 cm Ao Asc diam:  3.20 cm MITRAL VALVE MV Area (PHT): 3.53 cm     SHUNTS MV Decel  Time: 215 msec     Systemic VTI:  0.21 m MV E velocity: 83.10 cm/s   Systemic Diam: 2.20 cm MV A velocity: 105.00 cm/s MV E/A ratio:  0.79 Armanda Magic MD Electronically signed by Armanda Magic MD Signature Date/Time: 04/19/2023/10:37:43 AM    Final    DG Chest Port 1 View  Result Date: 04/18/2023 CLINICAL DATA:  Chest pain EXAM: PORTABLE CHEST 1 VIEW COMPARISON:  09/14/2014 FINDINGS: Normal heart size and mediastinal contours. No acute infiltrate or edema. No effusion or pneumothorax. No acute osseous findings. Artifact from EKG leads. IMPRESSION: No evidence of active disease. Electronically Signed   By: Tiburcio Pea M.D.   On: 04/18/2023 07:41    DISCHARGE EXAMINATION: Vitals:   04/19/23 1930 04/19/23 2320 04/20/23 0343 04/20/23 0742  BP: 100/66 100/66 94/62 (!) 91/55  Pulse: 63 (!) 58 65 64  Resp: 11 14 15 12   Temp: 98.6 F (37 C) 98 F (36.7 C) 97.8 F (36.6 C) 98.2 F (36.8 C)  TempSrc: Oral Oral Oral Oral  SpO2: 97% 95% 97% 97%  Weight:      Height:       General appearance: Awake alert.  In no distress Resp: Clear to auscultation bilaterally.  Normal effort Cardio: S1-S2 is normal regular.  No S3-S4.  No rubs murmurs or bruit GI: Abdomen is soft.  Nontender nondistended.  Bowel sounds are present normal.  No masses organomegaly    DISPOSITION: Home  Discharge Instructions     Call MD for:  difficulty breathing, headache or visual disturbances   Complete by: As directed    Call MD for:  extreme fatigue   Complete by: As directed    Call MD for:  persistant dizziness or light-headedness   Complete by: As directed    Call MD for:  persistant nausea and vomiting   Complete by: As directed    Call MD for:  severe uncontrolled pain   Complete by: As directed    Call MD for:  temperature >100.4   Complete by: As directed    Diet - low sodium heart healthy   Complete by: As directed    Discharge instructions   Complete by: As directed    Please follow instructions  provided by cardiology.  Monitor your blood pressures at home as instructed.  You were cared for by a hospitalist during your hospital stay. If you have any questions about your discharge medications or the care you received while you were in the hospital after you are discharged, you can call the unit and asked to speak with the hospitalist on call if the hospitalist that took care of you is not available. Once you are discharged, your primary care physician will handle any further medical issues. Please note that NO REFILLS for any discharge medications will be authorized once you are discharged, as  it is imperative that you return to your primary care physician (or establish a relationship with a primary care physician if you do not have one) for your aftercare needs so that they can reassess your need for medications and monitor your lab values. If you do not have a primary care physician, you can call [phone] for a physician referral.   Increase activity slowly   Complete by: As directed          Allergies as of 04/20/2023       Reactions   Latex Anaphylaxis   Penicillins Anaphylaxis, Other (See Comments)   Has patient had a PCN reaction causing immediate rash, facial/tongue/throat swelling, SOB or lightheadedness with hypotension: No Has patient had a PCN reaction causing severe rash involving mucus membranes or skin necrosis: No Has patient had a PCN reaction that required hospitalization No Has patient had a PCN reaction occurring within the last 10 years: No If all of the above answers are "NO", then may proceed with Cephalosporin use.   Erythromycin Hives, Itching   Ofloxacin Hives, Itching   Sulfonamide Derivatives Hives, Itching   Azithromycin Other (See Comments)   "makes my heart rate go crazy"   Fenofibrate Other (See Comments)   Cramps   Statins Nausea And Vomiting   Cramps        Medication List     STOP taking these medications    amLODipine 5 MG  tablet Commonly known as: NORVASC   hydrochlorothiazide 25 MG tablet Commonly known as: HYDRODIURIL   ibuprofen 600 MG tablet Commonly known as: ADVIL   ibuprofen 800 MG tablet Commonly known as: ADVIL       TAKE these medications    acetaminophen 500 MG tablet Commonly known as: TYLENOL Take 1 tablet (500 mg total) by mouth every 6 (six) hours as needed (pain).   aspirin EC 81 MG tablet Take 1 tablet (81 mg total) by mouth daily. Swallow whole. Start taking on: April 21, 2023   cyclobenzaprine 10 MG tablet Commonly known as: FLEXERIL Take 1 tablet (10 mg total) by mouth 3 (three) times daily as needed for muscle spasms.   DULoxetine 20 MG capsule Commonly known as: CYMBALTA Take 20 mg by mouth daily.   ezetimibe 10 MG tablet Commonly known as: ZETIA Take 10 mg by mouth daily.   FLUoxetine 40 MG capsule Commonly known as: PROZAC Take 1 capsule (40 mg total) by mouth daily. NEEDS APPT FOR REFILLS   HYDROcodone-acetaminophen 5-325 MG tablet Commonly known as: NORCO/VICODIN 1 po q 4-6hrs prn pain What changed:  how much to take how to take this when to take this reasons to take this additional instructions   insulin lispro 100 UNIT/ML injection Commonly known as: HUMALOG Inject 2 Units into the skin See admin instructions. Inject 2 units SQ up to 6 times daily as needed for BS > 250   isosorbide mononitrate 30 MG 24 hr tablet Commonly known as: IMDUR Take 1 tablet (30 mg total) by mouth daily. Start taking on: April 21, 2023   metoprolol succinate 25 MG 24 hr tablet Commonly known as: TOPROL-XL Take 1 tablet (25 mg total) by mouth daily. Start taking on: April 21, 2023   nitroGLYCERIN 0.4 MG SL tablet Commonly known as: NITROSTAT Place 0.4 mg under the tongue every 5 (five) minutes as needed for chest pain.   Ozempic (1 MG/DOSE) 4 MG/3ML Sopn Generic drug: Semaglutide (1 MG/DOSE) Inject 1 mg into the skin once a week.  pantoprazole 40 MG  tablet Commonly known as: PROTONIX Take 40 mg by mouth 2 (two) times daily.   POTASSIUM PO Take 1 tablet by mouth daily. otc   Tresiba FlexTouch 100 UNIT/ML FlexTouch Pen Generic drug: insulin degludec Inject 50 Units into the skin daily.   trospium 20 MG tablet Commonly known as: SANCTURA Take 1 tablet (20 mg total) by mouth 2 (two) times daily.          Follow-up Information     Swinyer, Zachary George, NP Follow up.   Specialty: Cardiology Why: Arrive by 10:40 AM on 04/28/2023. Appt at 10:55 AM (25 min) Contact information: [address] [place] [address] [phone]                 TOTAL DISCHARGE TIME: 35 minutes  [name]  [facility] Pager on [email or web address]  04/20/2023, 10:13 AM

## 2023-04-21 ENCOUNTER — Telehealth: Payer: Self-pay | Admitting: Cardiology

## 2023-04-21 LAB — LIPOPROTEIN A (LPA): Lipoprotein (a): 41.4 nmol/L — ABNORMAL HIGH (ref <75.0)

## 2023-04-21 MED ORDER — NITROGLYCERIN 0.4 MG SL SUBL: 0.4000 mg | SUBLINGUAL_TABLET | SUBLINGUAL | 0 refills | Status: AC | PRN

## 2023-04-21 NOTE — Telephone Encounter (Signed)
Pt has an appt with Eligha Bridegroom, NP on 04/28/23, would Eligha Bridegroom, NP like to refill this medication for pt until appt time? Please address

## 2023-04-21 NOTE — Telephone Encounter (Signed)
Pt's medication was sent to pt's pharmacy as requested. Confirmation received.  °

## 2023-04-21 NOTE — Telephone Encounter (Signed)
Pt c/o medication issue:  1. Name of Medication: nitroGLYCERIN (NITROSTAT) 0.4 MG SL tablet   2. How are you currently taking this medication (dosage and times per day)?   3. Are you having a reaction (difficulty breathing--STAT)?   4. What is your medication issue?  Patient's PCP is requesting this medication be sent in to:  [facility] [address] [place] - [address]

## 2023-04-27 NOTE — Progress Notes (Signed)
Cardiology Office Note:  .   Date:  04/28/2023  ID:  [name], DOB [birth year], MRN [id number] PCP: Rubye Beach   [facility] Providers Cardiologist:  Donato Schultz, MD    Patient Profile: .      PMH CAD >> moderate nonobstructive Normal coronaries on cath 2013 Coronary calcification LAD on CT 08/2014  LHC 04/19/23 with mild to moderate diffuse coronary calcification ramus lesion 70% stenosed Ostial LM to mid LM lesion 5% stenosed Ostial LAD to proximal LAD lesion 50% stenosed Hypertension Hyperlipidemia Type 2 diabetes Prior tobacco use Family history early CAD Mother had CABG in her 68s or 78s   Admission 6/18-6/20/24 for severe chest pain that woke her from sleep radiating to left jaw and shoulder described as heavy and squeezing.  She took 2 of her husbands sublingual nitroglycerin with intermittent relief.  Recently noted decrease in functional status when walking with a friend, this of breath.  Troponins negative x 2.  EKG revealed no acute ST T wave abnormalities.  Chest x-ray unremarkable.  Due to concerns with symptoms mimicking ACS, she underwent LHC on 04/19/2023 which revealed mild to moderate diffuse CAD.  Normal LVEDP.  Plan for medical management.  Normal LVEF 60 to 65% on TTE 04/19/2023.  She was started on Imdur 30 mg daily and Toprol XL 25 mg daily.  Continuing aspirin 81 mg daily and amlodipine 5 mg daily.  HCTZ was stopped in the setting of hypokalemia. LDL 104 on ezetimibe 10 mg daily.  Referred to Pharm.D. for initiation of PCSK9 inhibitor.      History of Present Illness: .   [name] is a very pleasant 62 y.o. female who is here alone today. Reports episodes of feeling "like her head is disconnected from her body." Most often notes symptoms about 1 and half hours after taking Imdur. Reports amlodipine was stopped in the hospital. Home SBP 102-118 mmHg. No further episodes of chest pain.  Reports occasional lower extremity edema, is  concerned about now being off hydrochlorothiazide. She denies shortness of breath, lower extremity edema, fatigue, palpitations, presyncope, syncope, orthopnea, and PND.  ROS: see HPI       Studies Reviewed: .         Risk Assessment/Calculations:             Physical Exam:   VS:  BP 118/68   Pulse 68   Ht 5\' 6"  (1.676 m)   Wt 185 lb (83.9 kg)   SpO2 98%   BMI 29.86 kg/m    Wt Readings from Last 3 Encounters:  04/28/23 185 lb (83.9 kg)  04/18/23 189 lb 6 oz (85.9 kg)  01/30/23 187 lb 8 oz (85 kg)    GEN: Well nourished, well developed in no acute distress NECK: No JVD; No carotid bruits CARDIAC: RRR, no murmurs, rubs, gallops RESPIRATORY:  Clear to auscultation without rales, wheezing or rhonchi  ABDOMEN: Soft, non-tender, non-distended EXTREMITIES:  No edema; No deformity     ASSESSMENT AND PLAN: .    CAD without angina: She presented to ED with severe chest pain on 04/18/23. Found to have moderate diffuse CAD on [facility] 04/19/23, medical management recommended. Was initiated on anti-anginal therapy during admission. No further episodes of chest pain. Is having some lightheadedness with Imdur but feels that it is improving. Agreeable to continue Imdur 30 mg and Toprol XL 25 mg daily. Advised she may try taking metoprolol at bedtime to see if symptoms improve.  Hyperlipidemia LDL  goal < 70: Normal lipoprotein a. LDL 104 on 04/18/23.  She reports previous intolerance to statins.  Is on ezetimibe 10 mg daily. Has been referred to Pharm.D. for lipid management and has soon appointment. Took Repatha in the past with excellent results, is unsure why she ever stopped it.   Hypertension: BP is well controlled but somewhat soft. Is having some lightheadedness, most noticeable after taking Imdur. Reports on anti-hypertensive therapy since preeclampsia with pregnancies and took hydrochlorothiazide and amlodipine for many years. These have been discontinued. We discussed the action of her  current medical therapy. Encouraged her to take metoprolol at night to see if lightheadedness improves.  Continue to monitor home BP routinely and notify us with concerns.        Dispo: 6 months with Dr. Anne Fu  Signed, Eligha Bridegroom, NP-C

## 2023-04-28 ENCOUNTER — Ambulatory Visit: Payer: Medicare Other | Attending: Nurse Practitioner | Admitting: Nurse Practitioner

## 2023-04-28 ENCOUNTER — Encounter: Payer: Self-pay | Admitting: Nurse Practitioner

## 2023-04-28 VITALS — BP 118/68 | HR 68 | Ht 66.0 in | Wt 185.0 lb

## 2023-04-28 DIAGNOSIS — E785 Hyperlipidemia, unspecified: Secondary | ICD-10-CM | POA: Diagnosis not present

## 2023-04-28 DIAGNOSIS — I2510 Atherosclerotic heart disease of native coronary artery without angina pectoris: Secondary | ICD-10-CM | POA: Diagnosis not present

## 2023-04-28 DIAGNOSIS — I10 Essential (primary) hypertension: Secondary | ICD-10-CM | POA: Diagnosis not present

## 2023-04-28 NOTE — Patient Instructions (Addendum)
hMedication Instructions:   Your physician recommends that you continue on your current medications as directed. Please refer to the Current Medication list given to you today.  *If you need a refill on your cardiac medications before your next appointment, please call your pharmacy*   Lab Work: NONE ORDERED  TODAY   If you have labs (blood work) drawn today and your tests are completely normal, you will receive your results only by: MyChart Message (if you have MyChart) OR A paper copy in the mail If you have any lab test that is abnormal or we need to change your treatment, we will call you to review the results.   Testing/Procedures:  NONE ORDERED  TODAY     Follow-Up: At [facility], you and your health needs are our priority.  As part of our continuing mission to provide you with exceptional heart care, we have created designated Provider Care Teams.  These Care Teams include your primary Cardiologist (physician) and Advanced Practice Providers (APPs -  Physician Assistants and Nurse Practitioners) who all work together to provide you with the care you need, when you need it.  We recommend signing up for the patient portal called "MyChart".  Sign up information is provided on this After Visit Summary.  MyChart is used to connect with patients for Virtual Visits (Telemedicine).  Patients are able to view lab/test results, encounter notes, upcoming appointments, etc.  Non-urgent messages can be sent to your provider as well.   To learn more about what you can do with MyChart, go to [email or web address].    Your next appointment:   6 month(s)  Provider:   Donato Schultz, MD     Other Instructions

## 2023-05-15 ENCOUNTER — Ambulatory Visit: Payer: Medicare Other | Attending: Cardiovascular Disease | Admitting: Pharmacist

## 2023-05-15 DIAGNOSIS — E782 Mixed hyperlipidemia: Secondary | ICD-10-CM | POA: Diagnosis not present

## 2023-05-15 DIAGNOSIS — I2510 Atherosclerotic heart disease of native coronary artery without angina pectoris: Secondary | ICD-10-CM | POA: Diagnosis not present

## 2023-05-15 MED ORDER — EZETIMIBE 10 MG PO TABS: 10.0000 mg | ORAL_TABLET | Freq: Every day | ORAL | 3 refills | Status: AC

## 2023-05-15 MED ORDER — ROSUVASTATIN CALCIUM 5 MG PO TABS: 5.0000 mg | ORAL_TABLET | Freq: Every day | ORAL | 11 refills | Status: DC

## 2023-05-15 NOTE — Patient Instructions (Addendum)
Your LDL cholesterol is 114 and your goal is < 55  Start rosuvastatin (Crestor) 5mg  - 1 tablet once daily  Continue your other meds including Zetia (ezetimibe) 10mg  daily  Recheck fasting labs Monday, September 16 any time after 7:30am  Call [place], PharmD #[phone] with any side effects on Crestor and we can try the Repatha instead

## 2023-05-15 NOTE — Progress Notes (Unsigned)
Patient ID: [name]                 DOB: 09/13/1961                    MRN: [id number]     HPI: [name] is a 62 y.o. female patient referred to lipid clinic by Dr Anne Fu. PMH is significant for CAD, HTN, HLD, T2DM, prior tobacco use, and FHx of CAD (mother with CABG in her 60s or 54s). Presented to the ED with severe chest pain on 04/18/23 and found to have moderate diffuse CAD on LHC (70% stenosis of ramus, 50% stenosis of ostial to prox LAD) with plan for medical management.  Pt presents today for follow up. Currently taking ezetimibe and tolerating well. Experienced cramping on pravastatin, Vytorin, and fenofibrate in the past. Symptoms would occur within a few weeks of med initiation and disappear within 1-2 weeks of med discontinuation. Her mother had trouble tolerating statins as well. Was on Repatha previously and tolerated well, thinks it may have been stopped due to issues with insurance coverage. Does have latex allergy listed (anaphylaxis) but denies any allergic reaction when she was on Repatha. Both she and her husband are currently in the donut hole - Ozempic costing ~$800/3 month supply.  Current Medications: ezetimibe 10mg  daily Intolerances: pravastatin 20mg  daily, Vytorin 10-40mg  daily, fenofibrate - cramping Risk Factors: premature CAD, HTN, DM, FHx CAD LDL goal: 55mg /dL  Diet: eating heart healthy diet  Exercise: walks regularly, joining [employer]  Family History: Mother with CABG in her 60s or 31s, also with kidney disease. Father with HTN.  Social History: Former smoker 0.3 PPD x 17 years.  Labs: 05/11/23: TC 184, TG 96, HDL 50, LDL 114, nonHDL 134 - ezetimibe 10mg  daily 04/20/23: Lp(a) 41.4  Past Medical History:  Diagnosis Date   Anxiety    Barrett esophagus    Dr. Juanda Chance in GI   Cancer [facility])    Skin Cancer   Chronic back pain    stenosis and DDD   DDD (degenerative disc disease)    s/p back surgeries and steroid injxns   Depression     takes Fluoxetine daily   Diabetes mellitus    Victoza daily   GERD (gastroesophageal reflux disease)    takes Nexium daily   History of kidney stones    Hyperlipidemia    hasn't been on Pravastatin in 6-7wks    Hypertension    takes Amlodipine and HCTZ daily   Interstitial cystitis    Joint pain    Joint swelling    Muscle spasm    takes Ativan nightly as needed   Numbness    weakness in right leg   Pneumonia    fall 2014   PONV (postoperative nausea and vomiting)    Spinal headache    blood patch required    Urinary frequency     Current Outpatient Medications on File Prior to Visit  Medication Sig Dispense Refill   acetaminophen (TYLENOL) 500 MG tablet Take 1 tablet (500 mg total) by mouth every 6 (six) hours as needed (pain). 30 tablet 0   aspirin EC 81 MG tablet Take 1 tablet (81 mg total) by mouth daily. Swallow whole. 30 tablet 12   cyclobenzaprine (FLEXERIL) 10 MG tablet Take 1 tablet (10 mg total) by mouth 3 (three) times daily as needed for muscle spasms. 30 tablet 0   DULoxetine (CYMBALTA) 20 MG capsule Take 20 mg by  mouth daily.     ezetimibe (ZETIA) 10 MG tablet Take 10 mg by mouth daily.     fluorouracil (EFUDEX) 5 % cream Apply 1 Application topically 2 (two) times daily.     FLUoxetine (PROZAC) 40 MG capsule Take 1 capsule (40 mg total) by mouth daily. NEEDS APPT FOR REFILLS 90 capsule 0   HYDROcodone-acetaminophen (NORCO/VICODIN) 5-325 MG tablet 1 po q 4-6hrs prn pain (Patient taking differently: Take 1 tablet by mouth every 4 (four) hours as needed for moderate pain.) 45 tablet 0   insulin lispro (HUMALOG) 100 UNIT/ML injection Inject 2 Units into the skin See admin instructions. Inject 2 units SQ up to 6 times daily as needed for BS > 250     isosorbide mononitrate (IMDUR) 30 MG 24 hr tablet Take 1 tablet (30 mg total) by mouth daily. 30 tablet 2   metoprolol succinate (TOPROL-XL) 25 MG 24 hr tablet Take 1 tablet (25 mg total) by mouth daily. 30 tablet 2    nitroGLYCERIN (NITROSTAT) 0.4 MG SL tablet Place 1 tablet (0.4 mg total) under the tongue every 5 (five) minutes as needed for chest pain. 25 tablet 0   OZEMPIC, 1 MG/DOSE, 4 MG/3ML SOPN Inject 2 mg into the skin once a week.     pantoprazole (PROTONIX) 40 MG tablet Take 40 mg by mouth 2 (two) times daily.     POTASSIUM PO Take 1 tablet by mouth daily. otc     TRESIBA FLEXTOUCH 100 UNIT/ML SOPN FlexTouch Pen Inject 50 Units into the skin daily.   5   trospium (SANCTURA) 20 MG tablet Take 1 tablet (20 mg total) by mouth 2 (two) times daily. 60 tablet 5   No current facility-administered medications on file prior to visit.    Allergies  Allergen Reactions   Latex Anaphylaxis   Penicillins Anaphylaxis and Other (See Comments)    Has patient had a PCN reaction causing immediate rash, facial/tongue/throat swelling, SOB or lightheadedness with hypotension: No Has patient had a PCN reaction causing severe rash involving mucus membranes or skin necrosis: No Has patient had a PCN reaction that required hospitalization No Has patient had a PCN reaction occurring within the last 10 years: No If all of the above answers are "NO", then may proceed with Cephalosporin use.   Erythromycin Hives and Itching   Ofloxacin Hives and Itching   Sulfonamide Derivatives Hives and Itching   Azithromycin Other (See Comments)    "makes my heart rate go crazy"   Fenofibrate Other (See Comments)    Cramps   Statins Nausea And Vomiting    Cramps    Assessment/Plan:  1. Hyperlipidemia - LDL 114 on ezetimibe 10mg  daily, above goal < 55 given CAD noted on cath + DM. Pt is intolerant to Vytorin and pravastatin (myalgias). Took Repatha in the past and tolerated well, however she's currently in the donut hole from her Ozempic so Repatha copay would likely be ~$130/month. Discussed rechallenge with low dose of rosuvastatin 5mg  daily which pt prefers to try first. She has been working on heart healthy diet and activity  with notable weight loss as well. Will recheck labs in 2 months. If needed, can pursue Repatha in the future, hopefully [facility] will reopen later this year.  [name] E. Nethan Caudillo, PharmD, BCACP, CPP [facility] [address], [place], [place] [address] Phone: [phone]; Fax: [phone] 05/15/2023 9:29 AM

## 2023-06-07 ENCOUNTER — Ambulatory Visit: Payer: Medicare Other | Admitting: Obstetrics and Gynecology

## 2023-06-30 ENCOUNTER — Encounter: Payer: Self-pay | Admitting: Obstetrics and Gynecology

## 2023-06-30 ENCOUNTER — Ambulatory Visit: Payer: Medicare Other | Admitting: Obstetrics and Gynecology

## 2023-06-30 VITALS — BP 118/75 | HR 73

## 2023-06-30 DIAGNOSIS — N3010 Interstitial cystitis (chronic) without hematuria: Secondary | ICD-10-CM | POA: Diagnosis not present

## 2023-06-30 DIAGNOSIS — R351 Nocturia: Secondary | ICD-10-CM

## 2023-06-30 NOTE — Progress Notes (Signed)
[facility]  Date of Visit: 06/30/2023  History of Present Illness: [name] is a 62 y.o. female   Surgery: s/p Midurethral sling ([employer]vantage Fit), cystoscopy on 01/30/23. Also had urethral diverticulectomy, cystoscopy, repair of bladder injury on 10/27/22   She stopped taking the trospium after a few weeks because she did not see improvement. She still has some leakage at night and urgency. She is waking up 5-10 times per night. She does not snore. During the day, does not have to wear a pad. Can usually wait a few hours during the day. Has history of IC as well.    Medications: She has a current medication list which includes the following prescription(s): acetaminophen, aspirin ec, cyclobenzaprine, duloxetine, ezetimibe, fluorouracil, fluoxetine, hydrocodone-acetaminophen, insulin lispro, isosorbide mononitrate, metoprolol succinate, nitroglycerin, ozempic (1 mg/dose), pantoprazole, rosuvastatin, tresiba flextouch, and trospium.   Allergies: Patient is allergic to latex, penicillins, erythromycin, ofloxacin, sulfonamide derivatives, azithromycin, fenofibrate, and statins.   Physical Exam: BP 118/75   Pulse 73    Gen: AAO x3  ---------------------------------------------------------  Assessment and Plan:  1. Nocturia   2. INTERSTITIAL CYSTITIS     - Reviewed options of desmopressin, botox and nerve stimulation therapies.  - Recommended intravesical botox as she also has IC and this can help with symptoms. Discussed risks of possible urinary retention.   - Will have her return for self cath teaching and botox procedure  Marguerita Beards, MD

## 2023-07-17 ENCOUNTER — Ambulatory Visit: Payer: Medicare Other | Attending: Cardiology

## 2023-07-17 DIAGNOSIS — I2510 Atherosclerotic heart disease of native coronary artery without angina pectoris: Secondary | ICD-10-CM

## 2023-07-18 LAB — LIPID PANEL
Chol/HDL Ratio: 2.5 ratio (ref 0.0–4.4)
Cholesterol, Total: 127 mg/dL (ref 100–199)
HDL: 51 mg/dL (ref >39)
LDL Chol Calc (NIH): 60 mg/dL (ref 0–99)
Triglycerides: 82 mg/dL (ref 0–149)
VLDL Cholesterol Cal: 16 mg/dL (ref 5–40)

## 2023-07-18 LAB — HEPATIC FUNCTION PANEL
ALT: 18 IU/L (ref 0–32)
AST: 20 IU/L (ref 0–40)
Albumin: 4.3 g/dL (ref 3.9–4.9)
Alkaline Phosphatase: 79 IU/L (ref 44–121)
Bilirubin Total: 0.5 mg/dL (ref 0.0–1.2)
Bilirubin, Direct: 0.16 mg/dL (ref 0.00–0.40)
Total Protein: 6.6 g/dL (ref 6.0–8.5)

## 2023-07-19 ENCOUNTER — Other Ambulatory Visit: Payer: Self-pay

## 2023-07-19 MED ORDER — METOPROLOL SUCCINATE ER 25 MG PO TB24: 25.0000 mg | ORAL_TABLET | Freq: Every day | ORAL | 9 refills | Status: AC

## 2023-07-19 MED ORDER — ISOSORBIDE MONONITRATE ER 30 MG PO TB24: 30.0000 mg | ORAL_TABLET | Freq: Every day | ORAL | 9 refills | Status: DC

## 2023-08-24 ENCOUNTER — Ambulatory Visit: Payer: Medicare Other

## 2023-08-31 ENCOUNTER — Ambulatory Visit: Payer: Medicare Other | Admitting: Obstetrics and Gynecology

## 2023-09-12 ENCOUNTER — Ambulatory Visit (INDEPENDENT_AMBULATORY_CARE_PROVIDER_SITE_OTHER): Payer: Medicare Other

## 2023-09-12 ENCOUNTER — Other Ambulatory Visit (HOSPITAL_COMMUNITY)
Admission: RE | Admit: 2023-09-12 | Discharge: 2023-09-12 | Disposition: A | Discharge status: Home / Self Care | Payer: Medicare Other | Source: Other Acute Inpatient Hospital | Attending: Obstetrics and Gynecology | Admitting: Obstetrics and Gynecology

## 2023-09-12 VITALS — BP 170/113 | HR 90

## 2023-09-12 DIAGNOSIS — N898 Other specified noninflammatory disorders of vagina: Secondary | ICD-10-CM | POA: Diagnosis present

## 2023-09-12 DIAGNOSIS — R350 Frequency of micturition: Secondary | ICD-10-CM | POA: Insufficient documentation

## 2023-09-12 LAB — POCT URINALYSIS DIPSTICK
Bilirubin, UA: NEGATIVE
Blood, UA: NEGATIVE
Glucose, UA: NEGATIVE
Ketones, UA: NEGATIVE
Leukocytes, UA: NEGATIVE
Nitrite, UA: NEGATIVE
Protein, UA: NEGATIVE
Spec Grav, UA: 1.01 (ref 1.010–1.025)
Urobilinogen, UA: 0.2 U/dL
pH, UA: 7.5 (ref 5.0–8.0)

## 2023-09-12 MED ORDER — PHENAZOPYRIDINE HCL 200 MG PO TABS: 200.0000 mg | ORAL_TABLET | Freq: Three times a day (TID) | ORAL | 0 refills | Status: AC | PRN

## 2023-09-12 NOTE — Patient Instructions (Signed)
Your Urine dip that was done in office was Negative. I am sending the urine off for culture and you can take AZO over the counter for your discomfort.  We have also ordered pyridium for you to take while we wait for your culture results, hopefully this gives you some relief. We will contact you when the results are back between 3-5 days. If a different antibiotic is needed we will sent the order to the pharmacy and you will be notified. If you have any questions or concerns please feel free to call us at [phone]

## 2023-09-13 ENCOUNTER — Telehealth: Payer: Self-pay | Admitting: Cardiology

## 2023-09-13 NOTE — Progress Notes (Unsigned)
Cardiology Clinic Note   Patient Name: [name] Date of Encounter: 09/14/2023  Primary Care Provider:  Jamal Collin, PA-C Primary Cardiologist:  Donato Schultz, MD  Patient Profile    [name] 62 year old female presents to the clinic today for an evaluation of her blood pressure and presyncope.  Past Medical History    Past Medical History:  Diagnosis Date   Anxiety    Barrett esophagus    Dr. Juanda Chance in GI   Cancer [facility])    Skin Cancer   Chronic back pain    stenosis and DDD   DDD (degenerative disc disease)    s/p back surgeries and steroid injxns   Depression    takes Fluoxetine daily   Diabetes mellitus    Victoza daily   GERD (gastroesophageal reflux disease)    takes Nexium daily   History of kidney stones    Hyperlipidemia    hasn't been on Pravastatin in 6-7wks    Hypertension    takes Amlodipine and HCTZ daily   Interstitial cystitis    Joint pain    Joint swelling    Muscle spasm    takes Ativan nightly as needed   Numbness    weakness in right leg   Pneumonia    fall 2014   PONV (postoperative nausea and vomiting)    Spinal headache    blood patch required    Urinary frequency    Past Surgical History:  Procedure Laterality Date   adhesions removed from abdomen     APPENDECTOMY     BACK SURGERY  12/2007   x 6-fusion x 2   BIOPSY  05/19/2022   Procedure: BIOPSY;  Surgeon: Willis Modena, MD;  Location: [facility];  Service: Gastroenterology;;   BLADDER SUSPENSION N/A 01/30/2023   Procedure: TRANSVAGINAL TAPE (TVT) PROCEDURE;  Surgeon: Marguerita Beards, MD;  Location: [facility];  Service: Gynecology;  Laterality: N/A;  total time requested is 1 hour   CARDIAC CATHETERIZATION  2013   CESAREAN SECTION  1988/1989   CHOLECYSTECTOMY     COLONOSCOPY  2011   COLONOSCOPY WITH PROPOFOL N/A 05/19/2022   Procedure: COLONOSCOPY WITH PROPOFOL;  Surgeon: Willis Modena, MD;  Location: [facility];  Service:  Gastroenterology;  Laterality: N/A;   CORONARY PRESSURE/FFR STUDY N/A 04/19/2023   Procedure: CORONARY PRESSURE/FFR STUDY;  Surgeon: Lennette Bihari, MD;  Location: [facility];  Service: Cardiovascular;  Laterality: N/A;   CYSTOSCOPY  10/27/2022   Procedure: CYSTOSCOPY;  Surgeon: Marguerita Beards, MD;  Location: [facility];  Service: Gynecology;;   CYSTOSCOPY N/A 01/30/2023   Procedure: CYSTOSCOPY;  Surgeon: Marguerita Beards, MD;  Location: [facility];  Service: Gynecology;  Laterality: N/A;   epidural injections     KNEE ARTHROSCOPY Bilateral    LAPAROSCOPIC LYSIS OF ADHESIONS N/A 04/15/2014   Procedure: LAPAROSCOPIC LYSIS OF ADHESION;  Surgeon: Clovis Pu. Cornett, MD;  Location: [facility] OR;  Service: General;  Laterality: N/A;   LAPAROSCOPY N/A 04/15/2014   Procedure: LAPAROSCOPY DIAGNOSTIC;  Surgeon: Clovis Pu. Cornett, MD;  Location: [facility];  Service: General;  Laterality: N/A;   LEFT HEART CATH AND CORONARY ANGIOGRAPHY N/A 04/19/2023   Procedure: LEFT HEART CATH AND CORONARY ANGIOGRAPHY;  Surgeon: Lennette Bihari, MD;  [facility]: [facility];  Service: Cardiovascular;  Laterality: N/A;   LEFT HEART CATHETERIZATION WITH CORONARY ANGIOGRAM N/A 02/03/2012   Procedure: LEFT HEART CATHETERIZATION WITH CORONARY ANGIOGRAM;  Surgeon: Lennette Bihari, MD;  Location: [facility] CATH LAB;  Service: Cardiovascular;  Laterality: N/A;   LUMBAR FUSION Right 03/24/2015   Procedure: SI joint fusion - right ;  Surgeon: Aliene Beams, MD;  Location: [facility]O [facility];  Service: Neurosurgery;  Laterality: Right;  SI joint fusion - right    NECK SURGERY  12/2007   OOPHORECTOMY     TOTAL ABDOMINAL HYSTERECTOMY     TOTAL KNEE ARTHROPLASTY Right 12/25/2018   TOTAL KNEE ARTHROPLASTY Right 12/25/2018   Procedure: RIGHT TOTAL KNEE ARTHROPLASTY;  Surgeon: Cammy Copa, MD;  Location: [facility] OR;  Service: Orthopedics;  Laterality: Right;   upper gi endoscopy     URETHRAL CYST REMOVAL N/A  10/27/2022   Procedure: EXAM UNDER ANESTHESIA, URETHRAL DIVERTICULECTOMY, REPAIR OF BLADDER INJURY;  Surgeon: Marguerita Beards, MD;  Location: [facility];  Service: Gynecology;  Laterality: N/A;    Allergies  Allergies  Allergen Reactions   Latex Anaphylaxis   Penicillins Anaphylaxis and Other (See Comments)    Has patient had a PCN reaction causing immediate rash, facial/tongue/throat swelling, SOB or lightheadedness with hypotension: No Has patient had a PCN reaction causing severe rash involving mucus membranes or skin necrosis: No Has patient had a PCN reaction that required hospitalization No Has patient had a PCN reaction occurring within the last 10 years: No If all of the above answers are "NO", then may proceed with Cephalosporin use.   Erythromycin Hives and Itching   Ofloxacin Hives and Itching   Sulfonamide Derivatives Hives and Itching   Azithromycin Other (See Comments)    "makes my heart rate go crazy"   Fenofibrate Other (See Comments)    Cramps   Statins Nausea And Vomiting    Cramps    History of Present Illness    [name] has a PMH of coronary artery disease (normal coronary arteries via cath 2013, CT 11/15 noted coronary calcification, LHC 6/24 showed mild-moderate diffuse coronary calcification with ramus 70% stenosed, ostial left main-mid left main 5%, ostial LAD-proximal LAD 50%, HTN, HLD, type 2 diabetes, prior tobacco abuse, and family history of coronary artery disease with a mother who had CABG in her 9s or 40s.  She was admitted to the hospital 6/24 with severe chest pain.  She woke from her sleep.  She noted left jaw pain and shoulder heaviness/squeezing.  She took 2 of her husbands nitroglycerin and had intermittent relief.  She noted a decrease in her functional status when she was walking with friends.  She noted increased work of breathing.  Her cardiac troponins were negative x 2.  Her EKG showed no acute ST abnormalities.   Her chest x-ray was unremarkable.  Due to concern for ACS she underwent cardiac catheterization 6/24.  She was noted to have moderate diffuse CAD.  Medical management was recommended.  Her LDL continue to be elevated on ezetimibe and she was referred to pharmacy for PCSK9 inhibitor.  She was seen in follow-up by Eligha Bridegroom 04/28/2023.  During that time she reported that she felt like her head was disconnected from her body.  She presented by herself.  She noted symptoms about 1 hour after taking Imdur.  She reports that amlodipine was stopped in the hospital.  Her blood pressure was in the 100s-100 teens.  She denied further episodes of chest discomfort.  She had occasional lower extremity edema.  She denies shortness of breath, lower extremity edema, palpitations, presyncope orthopnea and PND.  She contacted  the nurse triage line today.  She reported that a week and a half ago she had a near syncopal event.  The episode occurred at home at night.  She reported that yesterday she was shopping with her daughter and had similar symptoms.  She fell backwards into a shelf.  She did not remember the episode.  She was added to my clinic schedule.  She presents to the clinic today for evaluation and states she has had multiple episodes of "catching herself."  She reports her first episode was about a week and a half ago when she got up at night to use the bathroom.  Second episode was yesterday while shopping with her daughter when she fell back and shelving, and third episode was today while waiting on her husband during colonoscopy procedure.  She denies loss of consciousness and incontinent episodes.  She has been walking regularly 5 days/week and denies exertional symptoms.  EKG today shows sinus rhythm.  We reviewed safety precautions.  She expressed understanding.  I will order CBC, BMP, magnesium, and cardiac event monitor.  We will have her follow-up after monitoring.  Today she denies chest pain,  shortness of breath, lower extremity edema, fatigue, palpitations, melena, hematuria, hemoptysis, diaphoresis, weakness,  orthopnea, and PND.    Home Medications    Prior to Admission medications   Medication Sig Start Date End Date Taking? Authorizing Provider  acetaminophen (TYLENOL) 500 MG tablet Take 1 tablet (500 mg total) by mouth every 6 (six) hours as needed (pain). 09/28/22   Marguerita Beards, MD  aspirin EC 81 MG tablet Take 1 tablet (81 mg total) by mouth daily. Swallow whole. 04/21/23   Osvaldo Shipper, MD  cyclobenzaprine (FLEXERIL) 10 MG tablet Take 1 tablet (10 mg total) by mouth 3 (three) times daily as needed for muscle spasms. 12/09/20   Julio Sicks, MD  DULoxetine (CYMBALTA) 20 MG capsule Take 20 mg by mouth daily.    [provider]  ezetimibe (ZETIA) 10 MG tablet Take 1 tablet (10 mg total) by mouth daily. 05/15/23   Jake Bathe, MD  fluorouracil (EFUDEX) 5 % cream Apply 1 Application topically 2 (two) times daily. 04/27/23   [provider]  FLUoxetine (PROZAC) 40 MG capsule Take 1 capsule (40 mg total) by mouth daily. NEEDS APPT FOR REFILLS 07/10/12   Nyoka Cowden, MD  HYDROcodone-acetaminophen (NORCO/VICODIN) 5-325 MG tablet 1 po q 4-6hrs prn pain Patient taking differently: Take 1 tablet by mouth every 4 (four) hours as needed for moderate pain (pain score 4-6). 01/04/19   Cammy Copa, MD  insulin lispro (HUMALOG) 100 UNIT/ML injection Inject 2 Units into the skin See admin instructions. Inject 2 units SQ up to 6 times daily as needed for BS > 250    [provider]  isosorbide mononitrate (IMDUR) 30 MG 24 hr tablet Take 1 tablet (30 mg total) by mouth daily. 07/19/23   Jake Bathe, MD  metoprolol succinate (TOPROL-XL) 25 MG 24 hr tablet Take 1 tablet (25 mg total) by mouth daily. 07/19/23   Jake Bathe, MD  nitroGLYCERIN (NITROSTAT) 0.4 MG SL tablet Place 1 tablet (0.4 mg total) under the tongue every 5 (five) minutes as needed  for chest pain. 04/21/23   Swinyer, Zachary George, NP  OZEMPIC, 1 MG/DOSE, 4 MG/3ML SOPN Inject 2 mg into the skin once a week. 01/18/22   [provider]  pantoprazole (PROTONIX) 40 MG tablet Take 40 mg by mouth 2 (two)  times daily. 11/17/20   [provider]  phenazopyridine (PYRIDIUM) 200 MG tablet Take 1 tablet (200 mg total) by mouth 3 (three) times daily as needed for pain. 09/12/23   Marguerita Beards, MD  rosuvastatin (CRESTOR) 5 MG tablet Take 1 tablet (5 mg total) by mouth daily. 05/15/23   Jake Bathe, MD  TRESIBA FLEXTOUCH 100 UNIT/ML SOPN FlexTouch Pen Inject 50 Units into the skin daily.  08/19/18   [provider]  trospium (SANCTURA) 20 MG tablet Take 1 tablet (20 mg total) by mouth 2 (two) times daily. 03/02/23   Marguerita Beards, MD    Family History    Family History  Problem Relation Age of Onset   Kidney disease Mother    Heart disease Mother        ischemic   Gallbladder disease Mother    Other Mother        rectovaginal fistula   Hypertension Father    Colon polyps Father    Allergies Sister    Asthma Sister    She indicated that her mother is deceased. She indicated that her father is deceased.  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 She is still active and ambulate on her own. She is currently smoking up to a pack per day but interested in stopping.    Social Determinants of Health   Financial Resource Strain: Low Risk  (02/08/2021)   Received from [facility] visits prior to 12/31/2022., [facility] visits prior to 12/31/2022.   Overall Financial Resource Strain (CARDIA)    Difficulty of Paying Living Expenses: Not hard at all  Food Insecurity: Low Risk  (05/11/2023)   [facility] from [facility]   Hunger Vital Sign    Worried About Running Out of Food in the Last Year: Never true    Ran Out of Food in the Last Year: Never true  Transportation Needs: Not on file (05/11/2023)  Physical Activity: Insufficiently Active (02/08/2021)   [facility] from [facility] visits prior to 12/31/2022., [facility] visits prior to 12/31/2022.   Exercise Vital Sign    Days of Exercise per Week: 3 days    Minutes of Exercise per Session: 40 min  Stress: No Stress Concern Present (02/08/2021)   [facility] visits prior to 12/31/2022., [facility] visits prior to 12/31/2022.   [employer] of Occupational  Health - Occupational Stress Questionnaire    Feeling of Stress : Not at all  Social Connections: Socially Integrated (02/08/2021)   Received from [facility] visits prior to 12/31/2022., [facility] visits prior to 12/31/2022.   Social Connection and Isolation Panel [NHANES]    Frequency of Communication with Friends and Family: More than three times a week    Frequency of Social Gatherings with Friends and Family: Three times a week    Attends Religious Services: More than 4 times per year    Active Member of Clubs or Organizations: Yes    Attends  Banker Meetings: More than 4 times per year    Marital Status: Married  Catering manager Violence: Not At Risk (04/18/2023)   Humiliation, Afraid, Rape, and Kick questionnaire    Fear of Current or Ex-Partner: No    Emotionally Abused: No    Physically Abused: No    Sexually Abused: No     Review of Systems    General:  No chills, fever, night sweats or weight changes.  Cardiovascular:  No chest pain, dyspnea on exertion, edema, orthopnea, palpitations, paroxysmal nocturnal dyspnea. Dermatological: No rash, lesions/masses Respiratory: No cough, dyspnea Urologic: No hematuria, dysuria Abdominal:   No nausea, vomiting, diarrhea, bright red blood per rectum, melena, or hematemesis Neurologic:  No visual changes, wkns, changes in mental status. All other systems reviewed and are otherwise negative except as noted above.  Physical Exam    VS:  BP 138/86 (BP Location: Left Arm, Patient Position: Sitting, Cuff Size: Normal)   Pulse 69   Ht 5\' 6"  (1.676 m)   Wt 194 lb (88 kg)   BMI 31.31 kg/m  , BMI Body mass index is 31.31 kg/m. GEN: Well nourished, well developed, in no acute distress. HEENT: normal. Neck: Supple, no JVD, carotid bruits, or masses. Cardiac: RRR, no murmurs, rubs, or gallops. No clubbing, cyanosis, edema.  Radials/DP/PT 2+ and equal bilaterally.  Respiratory:  Respirations regular and unlabored, clear to auscultation bilaterally. GI: Soft, nontender, nondistended, BS + x 4. MS: no deformity or atrophy. Skin: warm and dry, no rash. Neuro:  Strength and sensation are intact. Psych: Normal affect.  Accessory Clinical Findings    Recent Labs: 04/18/2023: Magnesium 2.1; TSH 1.275 04/20/2023: BUN 13; Creatinine, Ser 0.82; Hemoglobin 11.3; Platelets 293; Potassium 3.8; Sodium 137 07/17/2023: ALT 18   Recent Lipid Panel    Component Value Date/Time   CHOL 127 07/17/2023 0934   TRIG 82 07/17/2023 0934   HDL 51 07/17/2023 0934   CHOLHDL 2.5 07/17/2023  0934   CHOLHDL 4.0 04/18/2023 2357   VLDL 28 04/18/2023 2357   LDLCALC 60 07/17/2023 0934   LDLDIRECT 148.6 07/23/2007 1115         ECG personally reviewed by me today- EKG Interpretation Date/Time:  Thursday September 14 2023 13:18:55 EST Ventricular Rate:  69 PR Interval:  138 QRS Duration:  70 QT Interval:  434 QTC Calculation: 465 R Axis:   21  Text Interpretation: Normal sinus rhythm Low voltage QRS When compared with ECG of 20-Apr-2023 07:07, No significant change was found Confirmed by Edd Fabian [phone]) on 09/14/2023 1:23:29 PM    Echocardiogram 04/19/2023  IMPRESSIONS     1. Left ventricular ejection fraction, by estimation, is 60 to 65%. The  left ventricle has normal function. The left ventricle has no regional  wall motion abnormalities. Left ventricular diastolic parameters were  normal.   2. Right ventricular  systolic function is normal. The right ventricular  size is normal. Tricuspid regurgitation signal is inadequate for assessing  PA pressure.   3. The mitral valve is normal in structure. No evidence of mitral valve  regurgitation. No evidence of mitral stenosis.   4. The aortic valve is normal in structure. Aortic valve regurgitation is  not visualized. No aortic stenosis is present.   5. The inferior vena cava is normal in size with greater than 50%  respiratory variability, suggesting right atrial pressure of 3 mmHg.   FINDINGS   Left Ventricle: Left ventricular ejection fraction, by estimation, is 60  to 65%. The left ventricle has normal function. The left ventricle has no  regional wall motion abnormalities. The left ventricular internal cavity  size was normal in size. There is   no left ventricular hypertrophy. Left ventricular diastolic parameters  were normal. Normal left ventricular filling pressure.   Right Ventricle: The right ventricular size is normal. No increase in  right ventricular wall thickness. Right ventricular systolic  function is  normal. Tricuspid regurgitation signal is inadequate for assessing PA  pressure.   Left Atrium: Left atrial size was normal in size.   Right Atrium: Right atrial size was normal in size.   Pericardium: There is no evidence of pericardial effusion.   Mitral Valve: The mitral valve is normal in structure. Mild mitral annular  calcification. No evidence of mitral valve regurgitation. No evidence of  mitral valve stenosis.   Tricuspid Valve: The tricuspid valve is normal in structure. Tricuspid  valve regurgitation is not demonstrated. No evidence of tricuspid  stenosis.   Aortic Valve: The aortic valve is normal in structure. Aortic valve  regurgitation is not visualized. No aortic stenosis is present. Aortic  valve peak gradient measures 7.0 mmHg.   Pulmonic Valve: The pulmonic valve was normal in structure. Pulmonic valve  regurgitation is not visualized. No evidence of pulmonic stenosis.   Aorta: The aortic root is normal in size and structure.   Venous: The inferior vena cava is normal in size with greater than 50%  respiratory variability, suggesting right atrial pressure of 3 mmHg.   IAS/Shunts: No atrial level shunt detected by color flow Doppler.    Cardiac catheterization 04/19/2023    Ramus lesion is 70% stenosed.   Ost LM to Mid LM lesion is 5% stenosed.   Ost LAD to Prox LAD lesion is 50% stenosed.   Recommend Aspirin 81mg  daily for moderate CAD.   There is evidence for mild to moderate diffuse coronary calcification.   Left main has mild 5% mid narrowing and trifurcates into the LAD, a moderate size ramus intermediate vessel, and left circumflex coronary artery.   LAD is calcified proximally with stenosis of 50% in the very proximal bend of the vessel just after the ramus take-off.   Ramus intermediate vessel has 70% eccentric stenosis ostially.   The left circumflex coronary artery is free of obstructive disease.   The RCA is a small caliber  normal vessel.   LVEDP 9 mmHg.   Abbott pressure wire X RFR flow analysis of the LAD was 0.95 arguing against hemodynamically significant stenosis.   RECOMMENDATION: Since the LAD proximal stenosis was not hemodynamically significant, plan initial medical therapy trial for the eccentric ostial ramus intermediate stenosis.  The patient's troponins are negative and ECG normal.  Echo reveals normal LV function without wall motion abnormality.  She has been on low-dose amlodipine 5 mg will initiate isosorbide 30 mg and metoprolol  succinate initially at 25 mg.  Further titrate amlodipine depending upon clinical response.  Patient will need aggressive lipid-lowering therapy.  She has only been on Zetia and apparently cannot tolerate statins.  Will need to initiate PCSK9 inhibition with target LDL less than 55.  Diagnostic Dominance: Right  Intervention    Assessment & Plan   1.  Presyncope-EKG today shows sinus rhythm 69 bpm .  Contacted triage line yesterday and reported 2 episodes of presyncope.  The last episode was yesterday while she was out shopping with her daughter.  She fell backward into a shelf and did not remember the event. Cardiac event monitor CBC, BMP, magnesium  Coronary artery disease-no chest pain today.  Last cardiac catheterization 04/19/2023 which showed mild-moderate diffuse coronary artery disease.  Details above. Continue current medical therapy Heart healthy low-sodium diet  Hyperlipidemia-LDL 60 on 9/24. High-fiber diet Continue aspirin, rosuvastatin, ezetimibe  Hypertension-BP today 138/86. Maintain blood pressure log Heart healthy low-sodium diet Continue Imdur, metoprolol  Disposition: Follow-up with Dr. Anne Fu or me in 6-8 weeks.   Thomasene Ripple. Exie Chrismer NP-C     09/14/2023, 1:24 PM [facility] [address] Office [phone] Fax [phone]    I spent 14 minutes examining this patient, reviewing medications,  and using patient centered shared decision making involving her cardiac care.   I spent greater than 20 minutes reviewing her past medical history,  medications, and prior cardiac tests.

## 2023-09-13 NOTE — Telephone Encounter (Signed)
I spoke with patient.  She states she fell about a week and a half ago.  Occurred at home during the night. She is not sure if she passed out. Yesterday while shopping with her daughter she fell again.  Patient states her daughter said she stopped and then fell backward into a shelf.  Patient does not remember much of this episode.  She saw her neurosurgeon today who felt episodes may be heart related.  Patient saw provider yesterday to follow up after recent bladder surgery and reports her BP was elevated at this visit.  Currently patient is not having any symptoms.  Appointment made for to see Edd Fabian, NP on 11/14.  ED precautions reviewed with patient

## 2023-09-13 NOTE — Telephone Encounter (Signed)
Pt c/o BP issue: STAT if pt c/o blurred vision, one-sided weakness or slurred speech  1. What are your last 5 BP readings? na  2. Are you having any other symptoms (ex. Dizziness, headache, blurred vision, passed out)? Passed out  3. What is your BP issue? BP has been elevated  Pt seems to have syncope also

## 2023-09-14 ENCOUNTER — Encounter: Payer: Self-pay | Admitting: General Practice

## 2023-09-14 ENCOUNTER — Ambulatory Visit (INDEPENDENT_AMBULATORY_CARE_PROVIDER_SITE_OTHER): Payer: Medicare Other

## 2023-09-14 ENCOUNTER — Ambulatory Visit: Payer: Medicare Other | Attending: General Practice | Admitting: General Practice

## 2023-09-14 VITALS — BP 138/86 | HR 69 | Ht 66.0 in | Wt 194.0 lb

## 2023-09-14 DIAGNOSIS — R55 Syncope and collapse: Secondary | ICD-10-CM | POA: Diagnosis not present

## 2023-09-14 DIAGNOSIS — I2510 Atherosclerotic heart disease of native coronary artery without angina pectoris: Secondary | ICD-10-CM

## 2023-09-14 DIAGNOSIS — E782 Mixed hyperlipidemia: Secondary | ICD-10-CM | POA: Diagnosis not present

## 2023-09-14 DIAGNOSIS — I10 Essential (primary) hypertension: Secondary | ICD-10-CM | POA: Diagnosis not present

## 2023-09-14 DIAGNOSIS — I2081 Angina pectoris with coronary microvascular dysfunction: Secondary | ICD-10-CM

## 2023-09-14 LAB — URINE CULTURE: Culture: 70000 — AB

## 2023-09-14 NOTE — Patient Instructions (Addendum)
Medication Instructions:   *If you need a refill on your cardiac medications before your next appointment, please call your pharmacy*   Lab Work: CBC,BMET AND MAG TODAY  Other Instructions -GO TO OUR [facility] [address] OFFICE FOR MONITOR PLACEMENT-[address], [place], [place] [address] -NO DRIVING AT THIS TIME -NOTE PROVIDED  Follow-Up: At [facility], you and your health needs are our priority.  As part of our continuing mission to provide you with exceptional heart care, we have created designated Provider Care Teams.  These Care Teams include your primary Cardiologist (physician) and Advanced Practice Providers (APPs -  Physician Assistants and Nurse Practitioners) who all work together to provide you with the care you need, when you need it.  Your next appointment:   6 week(s)  Provider:   Donato Schultz, MD  or Edd Fabian, FNP

## 2023-09-14 NOTE — Progress Notes (Unsigned)
ZIO XT serial # [id number] from office inventory applied to patient.  Dr. Anne Fu to read.

## 2023-09-15 ENCOUNTER — Other Ambulatory Visit (HOSPITAL_COMMUNITY): Payer: Self-pay | Admitting: Obstetrics and Gynecology

## 2023-09-15 DIAGNOSIS — N3000 Acute cystitis without hematuria: Secondary | ICD-10-CM

## 2023-09-15 LAB — CBC
Hematocrit: 38.1 % (ref 34.0–46.6)
Hemoglobin: 12.7 g/dL (ref 11.1–15.9)
MCH: 31.5 pg (ref 26.6–33.0)
MCHC: 33.3 g/dL (ref 31.5–35.7)
MCV: 95 fL (ref 79–97)
Platelets: 341 10*3/uL (ref 150–450)
RBC: 4.03 x10E6/uL (ref 3.77–5.28)
RDW: 12.7 % (ref 11.7–15.4)
WBC: 9.5 10*3/uL (ref 3.4–10.8)

## 2023-09-15 LAB — BASIC METABOLIC PANEL
BUN/Creatinine Ratio: 15 (ref 12–28)
BUN: 16 mg/dL (ref 8–27)
CO2: 25 mmol/L (ref 20–29)
Calcium: 9.9 mg/dL (ref 8.7–10.3)
Chloride: 103 mmol/L (ref 96–106)
Creatinine, Ser: 1.04 mg/dL — ABNORMAL HIGH (ref 0.57–1.00)
Glucose: 101 mg/dL — ABNORMAL HIGH (ref 70–99)
Potassium: 4.4 mmol/L (ref 3.5–5.2)
Sodium: 143 mmol/L (ref 134–144)
eGFR: 61 mL/min/{1.73_m2} (ref >59)

## 2023-09-15 LAB — MAGNESIUM: Magnesium: 2.3 mg/dL (ref 1.6–2.3)

## 2023-09-15 MED ORDER — NITROFURANTOIN MONOHYD MACRO 100 MG PO CAPS
100.0000 mg | ORAL_CAPSULE | Freq: Two times a day (BID) | ORAL | 0 refills | Status: AC
Start: 2023-09-15 — End: 2023-09-20

## 2023-09-15 NOTE — Progress Notes (Signed)
Patient has been notified

## 2023-09-19 ENCOUNTER — Ambulatory Visit: Payer: Medicare Other | Admitting: Obstetrics and Gynecology

## 2023-09-19 ENCOUNTER — Encounter: Payer: Self-pay | Admitting: Obstetrics and Gynecology

## 2023-09-19 VITALS — BP 145/77 | HR 60

## 2023-09-19 DIAGNOSIS — R300 Dysuria: Secondary | ICD-10-CM

## 2023-09-19 DIAGNOSIS — R350 Frequency of micturition: Secondary | ICD-10-CM

## 2023-09-19 DIAGNOSIS — N3941 Urge incontinence: Secondary | ICD-10-CM | POA: Diagnosis not present

## 2023-09-19 LAB — POCT URINALYSIS DIPSTICK
Bilirubin, UA: NEGATIVE
Blood, UA: NEGATIVE
Glucose, UA: NEGATIVE
Ketones, UA: NEGATIVE
Leukocytes, UA: NEGATIVE
Nitrite, UA: NEGATIVE
Protein, UA: NEGATIVE
Spec Grav, UA: 1.01 (ref 1.010–1.025)
Urobilinogen, UA: 0.2 U/dL
pH, UA: 7.5 (ref 5.0–8.0)

## 2023-09-19 MED ORDER — TROSPIUM CHLORIDE 20 MG PO TABS
20.0000 mg | ORAL_TABLET | Freq: Two times a day (BID) | ORAL | 11 refills | Status: AC
Start: 2023-09-19 — End: ?

## 2023-09-19 NOTE — Progress Notes (Signed)
[facility]  Date of Visit: 09/19/2023  History of Present Illness: [name] is a 62 y.o. female   Surgery: s/p Midurethral sling (Advantage Fit), cystoscopy on 01/30/23. Also had urethral diverticulectomy, cystoscopy, repair of bladder injury on 10/27/22   Previously had been interested in botox but cancelled. She is having a lot more urgency and frequency. She is voiding twice an hour, sometimes a small amount at a time. She has been drinking about 60 oz water per day, not anything else.   She has an odor to her urine. She is taking nitrofurantoin for aerococcus (Culture positive 09/19/23) but has not seen an improvement.    Medications: She has a current medication list which includes the following prescription(s): acetaminophen, aspirin ec, cyclobenzaprine, duloxetine, ezetimibe, fluorouracil, fluoxetine, hydrocodone-acetaminophen, insulin lispro, isosorbide mononitrate, metoprolol succinate, nitrofurantoin (macrocrystal-monohydrate), nitroglycerin, ozempic (1 mg/dose), pantoprazole, phenazopyridine, rosuvastatin, tresiba flextouch, and trospium.   Allergies: Patient is allergic to latex, penicillins, erythromycin, ofloxacin, sulfonamide derivatives, azithromycin, fenofibrate, and statins.   Physical Exam: BP (!) 145/77   Pulse 60    Gen: AAO x3  ---------------------------------------------------------  Assessment and Plan:  1. Dysuria   2. Urge incontinence   3. Urinary frequency      - Will sent [facility] molecular urine culture as she is still having symptoms and her prior culture did not have sensitivities - She will restart the trospium 20mg  BID, new refill provided.  - Has cysto with botox scheduled in Jan. She is not 100% sure she wants the botox. Regardless, will plan for cystoscopy at that date to rule out any issues with the sling or diverticulum procedure.   Marguerita Beards, MD

## 2023-09-21 LAB — PATHOLOGY REPORT - SCANNED: Chlamydia, Swab/Urine, PCR: NEGATIVE

## 2023-09-22 ENCOUNTER — Encounter: Payer: Self-pay | Admitting: Obstetrics and Gynecology

## 2023-09-22 ENCOUNTER — Other Ambulatory Visit: Payer: Self-pay | Admitting: Obstetrics and Gynecology

## 2023-09-22 MED ORDER — DOXYCYCLINE HYCLATE 100 MG PO CAPS
100.0000 mg | ORAL_CAPSULE | Freq: Two times a day (BID) | ORAL | 0 refills | Status: DC
Start: 2023-09-22 — End: 2023-12-04

## 2023-09-22 NOTE — Progress Notes (Signed)
Doxycycline sent for treatment of multiple pathogens present in urine as suggested by [facility] urine test. This will be scanned into the chart.

## 2023-10-16 ENCOUNTER — Ambulatory Visit: Payer: Medicare Other | Attending: Cardiology | Admitting: Cardiology

## 2023-10-16 VITALS — BP 102/60 | HR 72 | Ht 66.0 in | Wt 202.0 lb

## 2023-10-16 DIAGNOSIS — E785 Hyperlipidemia, unspecified: Secondary | ICD-10-CM

## 2023-10-16 DIAGNOSIS — E782 Mixed hyperlipidemia: Secondary | ICD-10-CM | POA: Diagnosis not present

## 2023-10-16 DIAGNOSIS — I2510 Atherosclerotic heart disease of native coronary artery without angina pectoris: Secondary | ICD-10-CM

## 2023-10-16 NOTE — Patient Instructions (Signed)
Medication Instructions:  The current medical regimen is effective;  continue present plan and medications.  *If you need a refill on your cardiac medications before your next appointment, please call your pharmacy*  Follow-Up: At [facility], you and your health needs are our priority.  As part of our continuing mission to provide you with exceptional heart care, we have created designated Provider Care Teams.  These Care Teams include your primary Cardiologist (physician) and Advanced Practice Providers (APPs -  Physician Assistants and Nurse Practitioners) who all work together to provide you with the care you need, when you need it.  We recommend signing up for the patient portal called "MyChart".  Sign up information is provided on this After Visit Summary.  MyChart is used to connect with patients for Virtual Visits (Telemedicine).  Patients are able to view lab/test results, encounter notes, upcoming appointments, etc.  Non-urgent messages can be sent to your provider as well.   To learn more about what you can do with MyChart, go to [email or web address].    Your next appointment:   Follow up as needed with Dr Skains   

## 2023-10-16 NOTE — Progress Notes (Signed)
Cardiology Office Note:  .   Date:  10/16/2023  ID:  [name], DOB 26-Apr-1961, MRN [id number] PCP: Rubye Beach  [facility] Providers Cardiologist:  Donato Schultz, MD     History of Present Illness: .   [name] is a 62 y.o. female Discussed with the use of AI scribe   History of Present Illness   A 62 year old patient with a history of type 2 diabetes, hypertension, and hyperlipidemia presents for follow-up regarding hypertension and near syncope. The patient underwent a left heart catheterization in June 2024 due to severe chest pain, shoulder pain, and jaw pain that woke her from sleep. The catheterization revealed mild to moderate diffuse coronary artery calcification, with 70% stenosis in the ostial left main, mid left main, and 50% mild to moderate disease in the proximal LAD. Troponins were negative at the time. The patient attempted Imdur but experienced disconnected type symptoms. An EKG from November 2024 showed a sinus rhythm rate of 69 and overall low voltage. An echocardiogram from June 2024 showed an EF of 60-65% with no significant valvular disease.  The patient has also experienced episodes of near syncope, including an incident where she fell backward onto a shelf without remembering the event. A cardiac monitor was placed, which showed mainly normal sinus rhythm, brief runs of atrial tachycardia, rare PVCs and PACs, but no atrial fibrillation or pauses.  The patient reports feeling tired and experiencing episodes of palpitations, followed by sweating. She has had two falls, during which she did not feel anything unusual prior to the event. The patient is currently on Zetia 10mg  and Crestor 5mg  daily. Her LDL was 60, triglycerides 82, hemoglobin A1c 5.7, hemoglobin 11.3, creatinine 1.0, and TSH 1.2.          Studies Reviewed: .        Results   LABS Troponins: Negative (04/2023) LDL: 60 (07/17/2023) Triglycerides: 82  (07/17/2023) Hemoglobin A1c: 5.7 (07/17/2023) Hemoglobin: 11.3 (07/17/2023) Creatinine: 1.0 (07/17/2023) TSH: 1.2 (07/17/2023)  DIAGNOSTIC Left heart catheterization: Mild to moderate diffuse coronary artery calcification. 70% stenosis, ostial left main, mid left main, 5% minimal disease, proximal LAD 50% mild to moderate disease (04/2023) EKG: Sinus rhythm, rate 69, overall low voltage (09/14/2023) Echocardiogram: EF 60-65%, no significant valvular disease (04/19/2023) Cardiac monitor: Mainly normal sinus rhythm, brief runs of atrial tachycardia, rare PVCs and PACs, no atrial fibrillation, no pauses     Risk Assessment/Calculations:            Physical Exam:   VS:  BP 102/60   Pulse 72   Ht 5\' 6"  (1.676 m)   Wt 202 lb (91.6 kg)   SpO2 97%   BMI 32.60 kg/m    Wt Readings from Last 3 Encounters:  10/16/23 202 lb (91.6 kg)  09/14/23 194 lb (88 kg)  04/28/23 185 lb (83.9 kg)    GEN: Well nourished, well developed in no acute distress NECK: No JVD; No carotid bruits CARDIAC: RRR, no murmurs, no rubs, no gallops RESPIRATORY:  Clear to auscultation without rales, wheezing or rhonchi  ABDOMEN: Soft, non-tender, non-distended EXTREMITIES:  No edema; No deformity   ASSESSMENT AND PLAN: .    Assessment and Plan    Near Syncope Episodes likely due to vasovagal syncope with palpitations, sweating, and mechanical falls. Cardiac monitor showed mainly normal sinus rhythm, brief atrial tachycardia, rare PVCs and PACs, no atrial fibrillation, and no pauses. Monitor results were reassuring. Emphasized hydration and recognizing early symptoms to prevent  falls. - Ensure adequate hydration - Advise to sit or lie down with feet elevated if feeling faint - Consider compression stockings if symptoms persist  Coronary Artery Disease Left heart catheterization in June 2024 showed mild to moderate diffuse coronary artery calcification with 70% stenosis in the osteo left main, 50% stenosis in the  proximal LAD. Echocardiogram showed EF of 60-65%. LDL is well controlled at 60 with current medications. Emphasized maintaining current medication regimen to stabilize plaque and prevent progression. - Continue Zetia 10 mg daily - Continue Crestor 5 mg daily - Annual follow-up with primary care provider for prevention strategy  Hypertension Hypertension is well controlled with current medications. - Continue current antihypertensive regimen  Hyperlipidemia Hyperlipidemia is well controlled with an LDL of 60 and triglycerides of 82. - Continue Zetia 10 mg daily - Continue Crestor 5 mg daily  Type 2 Diabetes Mellitus Type 2 diabetes is well controlled with a hemoglobin A1c of 5.7%. - Continue current diabetes management regimen  General Health Maintenance Managed by primary care provider. No changes needed. - Annual follow-up with primary care provider  Follow-up - Follow up with primary care provider in one year - Contact cardiology if symptoms change.               Signed, Donato Schultz, MD

## 2023-10-17 ENCOUNTER — Telehealth: Payer: Self-pay

## 2023-10-17 NOTE — Telephone Encounter (Signed)
LETTER PROVIDED, PT WILL PRINT ON HER MYCHART

## 2023-10-17 NOTE — Telephone Encounter (Signed)
-----   Message from Ronney Asters sent at 10/16/2023 12:05 PM EST ----- Regarding: RE: pt needs letter Marcelino Duster, please provide her with a note that says that she was not able to travel during the time she was wearing cardiac event monitor due to risk for/concern for cardiac arrhythmia.  Please assist today that she was seen in the office and the amount of time that she wore the cardiac event monitor.  Thank you.  Thomasene Ripple. Cleaver NP-C     10/16/2023, 12:06 PM [facility] [address] Office [phone] Fax [phone] ----- Message ----- From: Sharin Grave, RN Sent: 10/16/2023   9:34 AM EST To: Ronney Asters, NP Subject: pt needs letter                                Good morning,  This pt saw Dr Anne Fu in follow up today.  She is requesting a note stating you advised her not to travel when you saw her 09/14/23 "because I had to wear a monitor."  There is nothing in your note stating this instruction but it does make since d/t syncope.  She needs the note to be addressed To Whom It May Concern and sent to her through MyChart.  She states she will print it and fax it to the airlines for reimbursement of her flight.  Thank you

## 2023-10-19 NOTE — Telephone Encounter (Signed)
LETTER PRINTED, SIGNED AND PUT AT THE FRONT DESK FOR PICK UP. PT NOTIFIED SHE WILL COME IN AND PICK UP

## 2023-11-02 ENCOUNTER — Emergency Department (HOSPITAL_BASED_OUTPATIENT_CLINIC_OR_DEPARTMENT_OTHER)
Admission: EM | Admit: 2023-11-02 | Discharge: 2023-11-02 | Disposition: A | Discharge status: Home / Self Care | Payer: Medicare Other | Attending: Emergency Medicine | Admitting: Emergency Medicine

## 2023-11-02 ENCOUNTER — Emergency Department (HOSPITAL_BASED_OUTPATIENT_CLINIC_OR_DEPARTMENT_OTHER): Payer: Medicare Other | Admitting: Radiology

## 2023-11-02 ENCOUNTER — Other Ambulatory Visit (HOSPITAL_BASED_OUTPATIENT_CLINIC_OR_DEPARTMENT_OTHER): Payer: Self-pay

## 2023-11-02 ENCOUNTER — Encounter (HOSPITAL_BASED_OUTPATIENT_CLINIC_OR_DEPARTMENT_OTHER): Payer: Self-pay | Admitting: Emergency Medicine

## 2023-11-02 ENCOUNTER — Other Ambulatory Visit: Payer: Self-pay

## 2023-11-02 DIAGNOSIS — Z794 Long term (current) use of insulin: Secondary | ICD-10-CM | POA: Diagnosis not present

## 2023-11-02 DIAGNOSIS — J069 Acute upper respiratory infection, unspecified: Secondary | ICD-10-CM | POA: Diagnosis not present

## 2023-11-02 DIAGNOSIS — Z7982 Long term (current) use of aspirin: Secondary | ICD-10-CM | POA: Insufficient documentation

## 2023-11-02 DIAGNOSIS — Z91040 Latex allergy status: Secondary | ICD-10-CM | POA: Insufficient documentation

## 2023-11-02 DIAGNOSIS — R059 Cough, unspecified: Secondary | ICD-10-CM | POA: Diagnosis present

## 2023-11-02 DIAGNOSIS — Z20822 Contact with and (suspected) exposure to covid-19: Secondary | ICD-10-CM | POA: Diagnosis not present

## 2023-11-02 LAB — RESP PANEL BY RT-PCR (RSV, FLU A&B, COVID)  RVPGX2
Influenza A by PCR: NEGATIVE
Influenza B by PCR: NEGATIVE
Resp Syncytial Virus by PCR: NEGATIVE
SARS Coronavirus 2 by RT PCR: NEGATIVE

## 2023-11-02 MED ORDER — BENZONATATE 100 MG PO CAPS
100.0000 mg | ORAL_CAPSULE | Freq: Three times a day (TID) | ORAL | 0 refills | Status: AC
  Filled 2023-11-02: qty 21, 7d supply, fill #0

## 2023-11-02 NOTE — ED Provider Notes (Signed)
 [place] EMERGENCY DEPARTMENT AT [facility] Provider Note   CSN: [id number] Arrival date & time: 11/02/23  9149     History  Chief Complaint  Patient presents with   Cough    [name] is a 63 y.o. female.  63 year old female here today for headache, cough and congestion.  Patient says that symptoms began New years eve with the sniffles.  Has been eating and drinking without difficulty.  Taking Motrin  and Tylenol  at home.   Cough      Home Medications Prior to Admission medications   Medication Sig Start Date End Date Taking? Authorizing Provider  benzonatate  (TESSALON ) 100 MG capsule Take 1 capsule (100 mg total) by mouth every 8 (eight) hours. 11/02/23  Yes Mannie Pac T, DO  acetaminophen  (TYLENOL ) 500 MG tablet Take 1 tablet (500 mg total) by mouth every 6 (six) hours as needed (pain). 09/28/22   Marilynne Rosaline SAILOR, MD  aspirin  EC 81 MG tablet Take 1 tablet (81 mg total) by mouth daily. Swallow whole. 04/21/23   Krishnan, Gokul, MD  cyclobenzaprine  (FLEXERIL ) 10 MG tablet Take 1 tablet (10 mg total) by mouth 3 (three) times daily as needed for muscle spasms. 12/09/20   Louis Shove, MD  doxycycline  (VIBRAMYCIN ) 100 MG capsule Take 1 capsule (100 mg total) by mouth 2 (two) times daily. Take with food as can cause GI distress. 09/22/23   Zuleta, Kaitlin G, NP  DULoxetine  (CYMBALTA ) 20 MG capsule Take 20 mg by mouth daily.    [provider]  ezetimibe  (ZETIA ) 10 MG tablet Take 1 tablet (10 mg total) by mouth daily. 05/15/23   Jeffrie Oneil BROCKS, MD  fluorouracil (EFUDEX) 5 % cream Apply 1 Application topically 2 (two) times daily. 04/27/23   [provider]  FLUoxetine  (PROZAC ) 40 MG capsule Take 1 capsule (40 mg total) by mouth daily. NEEDS APPT FOR REFILLS 07/10/12   Darlean Ozell NOVAK, MD  HYDROcodone -acetaminophen  (NORCO/VICODIN) 5-325 MG tablet 1 po q 4-6hrs prn pain Patient taking differently: Take 1 tablet by mouth every 4 (four) hours as  needed for moderate pain (pain score 4-6). 01/04/19   Addie Cordella Hamilton, MD  insulin  lispro (HUMALOG ) 100 UNIT/ML injection Inject 2 Units into the skin See admin instructions. Inject 2 units SQ up to 6 times daily as needed for BS > 250    [provider]  isosorbide  mononitrate (IMDUR ) 30 MG 24 hr tablet Take 1 tablet (30 mg total) by mouth daily. 07/19/23   Jeffrie Oneil BROCKS, MD  metoprolol  succinate (TOPROL -XL) 25 MG 24 hr tablet Take 1 tablet (25 mg total) by mouth daily. 07/19/23   Jeffrie Oneil BROCKS, MD  nitroGLYCERIN  (NITROSTAT ) 0.4 MG SL tablet Place 1 tablet (0.4 mg total) under the tongue every 5 (five) minutes as needed for chest pain. 04/21/23   Swinyer, Rosaline HERO, NP  OZEMPIC , 1 MG/DOSE, 4 MG/3ML SOPN Inject 2 mg into the skin once a week. 01/18/22   [provider]  pantoprazole  (PROTONIX ) 40 MG tablet Take 40 mg by mouth 2 (two) times daily. 11/17/20   [provider]  phenazopyridine  (PYRIDIUM ) 200 MG tablet Take 1 tablet (200 mg total) by mouth 3 (three) times daily as needed for pain. 09/12/23   Marilynne Rosaline SAILOR, MD  rosuvastatin  (CRESTOR ) 5 MG tablet Take 1 tablet (5 mg total) by mouth daily. 05/15/23   Jeffrie Oneil BROCKS, MD  TRESIBA  FLEXTOUCH 100 UNIT/ML SOPN FlexTouch Pen Inject 50 Units into the skin daily.  08/19/18   [provider]  trospium  (SANCTURA ) 20 MG tablet Take 1 tablet (20 mg total) by mouth 2 (two) times daily. 09/19/23   Marilynne Rosaline SAILOR, MD      Allergies    Latex, Penicillins, Erythromycin, Ofloxacin, Sulfonamide derivatives, Azithromycin , Fenofibrate, and Statins    Review of Systems   Review of Systems  Respiratory:  Positive for cough.     Physical Exam Updated Vital Signs BP 113/78 (BP Location: Right Arm)   Pulse (!) 101   Temp 99.6 F (37.6 C)   Resp 19   SpO2 98%  Physical Exam Vitals reviewed.  HENT:     Head: Normocephalic.  Eyes:     Pupils: Pupils are equal, round, and reactive to light.   Cardiovascular:     Rate and Rhythm: Normal rate.  Pulmonary:     Effort: Pulmonary effort is normal. No respiratory distress.     Breath sounds: No wheezing or rales.  Chest:     Chest wall: No tenderness.  Neurological:     Mental Status: She is alert.     ED Results / Procedures / Treatments   Labs (all labs ordered are listed, but only abnormal results are displayed) Labs Reviewed  RESP PANEL BY RT-PCR (RSV, FLU A&B, COVID)  [id number]    EKG None  Radiology DG Chest 2 View Result Date: 11/02/2023 CLINICAL DATA:  Cough and fever for 3 days EXAM: CHEST - 2 VIEW COMPARISON:  04/18/2023 x-ray FINDINGS: No consolidation, pneumothorax or effusion. No edema. Normal cardiopericardial silhouette. Calcified aorta. Degenerative changes along the spine. Fixation hardware along the lower cervical spine at the edge of the imaging field IMPRESSION: No acute cardiopulmonary disease. Electronically Signed   By: Ranell Bring M.D.   On: 11/02/2023 10:28    Procedures Procedures    Medications Ordered in ED Medications - No data to display  ED Course/ Medical Decision Making/ A&P                                 Medical Decision Making 63 year old female here today with URI symptoms.  Plan-patient overall looks well.  Rate of 101, normotensive, nontachypneic.  Temp of 99 6.  Patient was chest x-ray done at triage, which per my independent review shows no pneumonia.  Viral swab in process.  Considered labs in this patient, however given her well appearance, do not believe they would change management.  Anticipate discharge.  Assessment 10:54 AM-patient's viral swabs negative.  Will discharge with Tessalon  Perles.  Return precaution discussed with patient at bedside.  Amount and/or Complexity of Data Reviewed Radiology: ordered.           Final Clinical Impression(s) / ED Diagnoses Final diagnoses:  Upper respiratory tract infection, unspecified type    Rx / DC Orders ED  Discharge Orders          Ordered    benzonatate  (TESSALON ) 100 MG capsule  Every 8 hours        11/02/23 1056              Mannie Pac T, DO 11/02/23 1056

## 2023-11-02 NOTE — ED Triage Notes (Signed)
 Pt c/o HA, cough and fever x 3 days. Tylenol at 0800 and ibuprofen today

## 2023-11-02 NOTE — Discharge Instructions (Signed)
 You are diagnosed with a viral upper respiratory infection.  You take 400 mg of ibuprofen  every 6 hours, 1000 mg of Tylenol  every 8 hours.  You can take Tessalon  Perles as needed for cough.  Your symptoms should begin to improve over the next 1 week.  Follow-up with your PCP.

## 2023-11-08 ENCOUNTER — Telehealth: Payer: Self-pay

## 2023-11-08 NOTE — Telephone Encounter (Signed)
 Pt was contacted to confirm botox scheduled for 11/16/23. Pt said she does not want the botox but will proceed with the cystoscopy

## 2023-11-10 ENCOUNTER — Ambulatory Visit: Payer: Medicare Other | Admitting: General Practice

## 2023-11-13 ENCOUNTER — Other Ambulatory Visit (HOSPITAL_BASED_OUTPATIENT_CLINIC_OR_DEPARTMENT_OTHER): Payer: Self-pay

## 2023-11-16 ENCOUNTER — Ambulatory Visit: Payer: Medicare Other | Admitting: Obstetrics and Gynecology

## 2023-11-16 VITALS — BP 103/71 | HR 67

## 2023-11-16 DIAGNOSIS — N390 Urinary tract infection, site not specified: Secondary | ICD-10-CM | POA: Diagnosis not present

## 2023-11-16 DIAGNOSIS — R300 Dysuria: Secondary | ICD-10-CM | POA: Diagnosis not present

## 2023-11-16 MED ORDER — ESTRADIOL 0.1 MG/GM VA CREA: TOPICAL_CREAM | VAGINAL | 11 refills | Status: AC

## 2023-11-16 MED ORDER — NITROFURANTOIN MONOHYD MACRO 100 MG PO CAPS: 100.0000 mg | ORAL_CAPSULE | Freq: Two times a day (BID) | ORAL | 0 refills | Status: AC

## 2023-11-16 NOTE — Progress Notes (Signed)
[facility] Return Visit  SUBJECTIVE  History of Present Illness: [name] is a 63 y.o. female presenting today for cystoscopy. She started having UTI symptoms yesterday- increased urgency and dysuria, therefore will not proceed with procedure today.   Last urine culture was 09/2023- positive for aerococcus species.   Trospium has been working well for her overall and has decreased her urgency. Had previously discussed intravesical botox but she prefers to stay on the medication.   S/p urethral diverticulectomy 09/2022 and mesh sling 01/2023  Past Medical History: Patient  has a past medical history of Anxiety, Barrett esophagus, Cancer (HCC), Chronic back pain, DDD (degenerative disc disease), Depression, Diabetes mellitus, GERD (gastroesophageal reflux disease), History of kidney stones, Hyperlipidemia, Hypertension, Interstitial cystitis, Joint pain, Joint swelling, Muscle spasm, Numbness, Pneumonia, PONV (postoperative nausea and vomiting), Spinal headache, and Urinary frequency.   Past Surgical History: She  has a past surgical history that includes Neck surgery (12/2007); Appendectomy; Total abdominal hysterectomy; Cholecystectomy; Colonoscopy (2011); upper gi endoscopy; Cesarean section (1988/1989); Oophorectomy; adhesions removed from abdomen; Knee arthroscopy (Bilateral); epidural injections; laparoscopy (N/A, 04/15/2014); Laparoscopic lysis of adhesions (N/A, 04/15/2014); left heart catheterization with coronary angiogram (N/A, 02/03/2012); Back surgery (12/2007); Lumbar fusion (Right, 03/24/2015); Total knee arthroplasty (Right, 12/25/2018); Total knee arthroplasty (Right, 12/25/2018); Cardiac catheterization (2013); Colonoscopy with propofol (N/A, 05/19/2022); biopsy (05/19/2022); Urethral cyst removal (N/A, 10/27/2022); Cystoscopy (10/27/2022); Bladder suspension (N/A, 01/30/2023); Cystoscopy (N/A, 01/30/2023); LEFT HEART CATH AND CORONARY ANGIOGRAPHY (N/A, 04/19/2023); and  CORONARY PRESSURE/FFR STUDY (N/A, 04/19/2023).   Medications: She has a current medication list which includes the following prescription(s): estradiol, nitrofurantoin (macrocrystal-monohydrate), acetaminophen, aspirin ec, benzonatate, cyclobenzaprine, doxycycline, duloxetine, ezetimibe, fluorouracil, fluoxetine, hydrocodone-acetaminophen, insulin lispro, isosorbide mononitrate, metoprolol succinate, nitroglycerin, ozempic (1 mg/dose), pantoprazole, phenazopyridine, rosuvastatin, tresiba flextouch, and trospium.   Allergies: Patient is allergic to latex, penicillins, erythromycin, ofloxacin, sulfonamide derivatives, azithromycin, fenofibrate, and statins.   Social History: Patient  reports that she has quit smoking. Her smoking use included cigarettes. She has a 5.1 pack-year smoking history. She has never used smokeless tobacco. She reports current alcohol use. She reports that she does not currently use drugs.     OBJECTIVE     Physical Exam: Vitals:   11/16/23 1046  BP: 103/71  Pulse: 67   Gen: No apparent distress, A&O x 3.  Detailed Urogynecologic Evaluation:  Deferred.   ASSESSMENT AND PLAN    Ms. Biedron is a 63 y.o. with:  1. Recurrent UTI   2. Dysuria     Recurrent UTI Assessment & Plan: - Will start vaginal estrogen cream 0.5g nightly for two weeks then twice a week after. We discussed possible low dose antibiotic prophylaxis.  - Will plan to reschedule cystoscopy   Orders: -     Estradiol; Place 0.5g nightly for two weeks then twice a week after  Dispense: 42.5 g; Refill: 11 -     Nitrofurantoin Monohyd Macro; Take 1 capsule (100 mg total) by mouth 2 (two) times daily for 5 days.  Dispense: 10 capsule; Refill: 0  Dysuria Assessment & Plan: - Will treat with acute UTI with nitrofuranotin  Orders: -     [facility] Molecular Test     Marguerita Beards, MD

## 2023-11-16 NOTE — Assessment & Plan Note (Signed)
-   Will treat with acute UTI with nitrofuranotin

## 2023-11-16 NOTE — Assessment & Plan Note (Signed)
-   Will start vaginal estrogen cream 0.5g nightly for two weeks then twice a week after. We discussed possible low dose antibiotic prophylaxis.  - Will plan to reschedule cystoscopy

## 2023-12-04 ENCOUNTER — Telehealth: Payer: Self-pay | Admitting: Obstetrics and Gynecology

## 2023-12-04 MED ORDER — DOXYCYCLINE HYCLATE 100 MG PO CAPS: 100.0000 mg | ORAL_CAPSULE | Freq: Two times a day (BID) | ORAL | 0 refills | Status: AC

## 2023-12-04 NOTE — Telephone Encounter (Signed)
Patient's PCR testing came back positive for Aerococcus urinae and Actinotignum Schaalii. Suggested antibioitcs for coverage of both include Augmentin, Ampicillin, and Doxyxycline. Patient is unable to take Penicillins so will send in course of Doxycycline.

## 2023-12-07 ENCOUNTER — Ambulatory Visit: Payer: Medicare Other | Admitting: Obstetrics and Gynecology

## 2023-12-07 VITALS — BP 130/76 | HR 70

## 2023-12-07 DIAGNOSIS — Z87440 Personal history of urinary (tract) infections: Secondary | ICD-10-CM

## 2023-12-07 DIAGNOSIS — N3010 Interstitial cystitis (chronic) without hematuria: Secondary | ICD-10-CM

## 2023-12-07 DIAGNOSIS — R350 Frequency of micturition: Secondary | ICD-10-CM

## 2023-12-07 LAB — POCT URINALYSIS DIPSTICK
Bilirubin, UA: NEGATIVE
Blood, UA: NEGATIVE
Glucose, UA: NEGATIVE
Ketones, UA: NEGATIVE
Leukocytes, UA: NEGATIVE
Nitrite, UA: NEGATIVE
Protein, UA: NEGATIVE
Spec Grav, UA: 1.025 (ref 1.010–1.025)
Urobilinogen, UA: 0.2 U/dL
pH, UA: 5.5 (ref 5.0–8.0)

## 2023-12-07 MED ORDER — HEPARIN SODIUM (PORCINE) 10000 UNIT/ML IJ SOLN
10000.0000 [IU] | Freq: Once | INTRAMUSCULAR | Status: AC
  Administered 2023-12-07: 10000 [IU] via INTRAVESICAL

## 2023-12-07 MED ORDER — SODIUM BICARBONATE 8.4 % IV SOLN
5.0000 mL | Freq: Once | INTRAVENOUS | Status: AC
  Administered 2023-12-07: 5 mL

## 2023-12-07 MED ORDER — LIDOCAINE HCL URETHRAL/MUCOSAL 2 % EX GEL
1.0000 | Freq: Once | CUTANEOUS | Status: AC
  Administered 2023-12-07: 1 via URETHRAL

## 2023-12-07 MED ORDER — BUPIVACAINE HCL 0.25 % IJ SOLN
20.0000 mL | Freq: Once | INTRAMUSCULAR | Status: AC
  Administered 2023-12-07: 20 mL

## 2023-12-07 MED ORDER — LIDOCAINE HCL 2 % IJ SOLN
20.0000 mL | Freq: Once | INTRAMUSCULAR | Status: AC
  Administered 2023-12-07: 400 mg

## 2023-12-07 NOTE — Patient Instructions (Signed)
 Taking Care of Yourself after Urodynamics, Cystoscopy, Bulkamid Injection, or Botox Injection   Drink plenty of water for a day or two following your procedure. Try to have about 8 ounces (one cup) at a time, and do this 6 times or more per day unless you have fluid restrictitons AVOID irritative beverages such as coffee, tea, soda, alcoholic or citrus drinks for a day or two, as this may cause burning with urination.  For the first 1-2 days after the procedure, your urine may be pink or red in color. You may have some blood in your urine as a normal side effect of the procedure. Large amounts of bleeding or difficulty urinating are NOT normal. Call the nurse line if this happens or go to the nearest Emergency Room if the bleeding is heavy or you cannot urinate at all and it is after hours.  You may experience some discomfort or a burning sensation with urination after having this procedure. You can use over the counter Azo or pyridium to help with burning and follow the instructions on the packaging. If it does not improve within 1-2 days, or other symptoms appear (fever, chills, or difficulty urinating) call the office to speak to a nurse.  You may return to normal daily activities such as work, school, driving, exercising and housework on the day of the procedure. If your doctor gave you a prescription, take it as ordered.

## 2023-12-07 NOTE — Progress Notes (Signed)
 CYSTOSCOPY  CC:  This is a 63 y.o. with recurrent UTI who presents today for cystoscopy.  Results for orders placed or performed in visit on 12/07/23 (from the past 24 hours)  POCT Urinalysis Dipstick     Status: None   Collection Time: 12/07/23  1:24 PM  Result Value Ref Range   Color, UA Yellow    Clarity, UA Clear    Glucose, UA Negative Negative   Bilirubin, UA Negative    Ketones, UA Negative    Spec Grav, UA 1.025 1.010 - 1.025   Blood, UA Negative    pH, UA 5.5 5.0 - 8.0   Protein, UA Negative Negative   Urobilinogen, UA 0.2 0.2 or 1.0 E.U./dL   Nitrite, UA Negative    Leukocytes, UA Negative Negative   Appearance     Odor       BP 130/76   Pulse 70   CYSTOSCOPY: A time out was performed.  The periurethral area was prepped and draped in a sterile manner.  2% lidocaine  jetpack was inserted at the urethral meatus and the urethra and bladder visualized with 0- and 70-degree scopes.  She had normal urethral coaptation and normal urethral mucosa.  She had normal bladder mucosa. She had bilateral clear efflux from both ureteral orifices.  She had no squamous metaplasia at the trigone, no trabeculations, cellules or diverticuli.  The bladder was drained and cystoscope removed.   Bladder instillation given with bupivicaine, lidocaine , heparin  and sodium bicarb   ASSESSMENT:  63 y.o. with recurrent UTI. Cystoscopy today is normal.  PLAN:  - Continue estrace  cream twice a week for UTI prevention - continue with doxycyline antibiotic prescribed earlier this week - If she has IC flare, can return for future bladder instillations  Follow up 6 weeks  Rosaline LOISE Caper, MD

## 2023-12-28 ENCOUNTER — Ambulatory Visit (INDEPENDENT_AMBULATORY_CARE_PROVIDER_SITE_OTHER): Payer: Medicare Other

## 2023-12-28 VITALS — BP 125/82 | HR 60 | Temp 99.3°F

## 2023-12-28 DIAGNOSIS — R82998 Other abnormal findings in urine: Secondary | ICD-10-CM

## 2023-12-28 DIAGNOSIS — Z87440 Personal history of urinary (tract) infections: Secondary | ICD-10-CM | POA: Diagnosis not present

## 2023-12-28 DIAGNOSIS — N390 Urinary tract infection, site not specified: Secondary | ICD-10-CM

## 2023-12-28 LAB — POCT URINALYSIS DIPSTICK
Blood, UA: NEGATIVE
Glucose, UA: NEGATIVE
Leukocytes, UA: NEGATIVE
Nitrite, UA: NEGATIVE
Protein, UA: NEGATIVE
Spec Grav, UA: 1.02 (ref 1.010–1.025)
Urobilinogen, UA: 0.2 U/dL
pH, UA: 7 (ref 5.0–8.0)

## 2023-12-28 NOTE — Progress Notes (Signed)
 [name] arrived today with UTI sx.  A urine specimen was collected and POCT Urine was done. POCT Urine was Negative for UTI.  Urine was not sent for culture, per discussion with Wendy Founds, NP. [name] should report to ER or urgent care for evaluation of Right sided flank pain with radiation to her abdomen.

## 2023-12-28 NOTE — Patient Instructions (Signed)
 It was a pleasure to see you today!  Thank you for trusting me with your  care!

## 2024-01-04 ENCOUNTER — Other Ambulatory Visit: Payer: Medicare Other | Admitting: Obstetrics and Gynecology

## 2024-01-24 ENCOUNTER — Ambulatory Visit: Payer: Medicare Other | Admitting: Obstetrics and Gynecology

## 2024-02-21 ENCOUNTER — Other Ambulatory Visit: Payer: Self-pay

## 2024-02-21 MED ORDER — ROSUVASTATIN CALCIUM 5 MG PO TABS: 5.0000 mg | ORAL_TABLET | Freq: Every day | ORAL | 2 refills | Status: DC

## 2024-03-01 ENCOUNTER — Other Ambulatory Visit (INDEPENDENT_AMBULATORY_CARE_PROVIDER_SITE_OTHER)

## 2024-03-01 ENCOUNTER — Ambulatory Visit: Admitting: Surgical

## 2024-03-01 DIAGNOSIS — M25512 Pain in left shoulder: Secondary | ICD-10-CM

## 2024-03-03 ENCOUNTER — Encounter: Payer: Self-pay | Admitting: Surgical

## 2024-03-03 NOTE — Progress Notes (Signed)
 Office Visit Note   Patient: [name]           Date of Birth: 02/06/1961           MRN: [id number] Visit Date: 03/01/2024 Requested by: Majorie Scrape, PA-C [address] [place],  [place] [address] PCP: Majorie Scrape, PA-C  Subjective: Chief Complaint  Patient presents with   Left Shoulder - Pain    HPI: [name] is a 63 y.o. female who presents to the office reporting left shoulder pain.  Patient states that she has pain ongoing for several weeks ever since she was hooking her bra behind her reaching behind with her left arm and felt a loud popping sensation that was heard by her husband at the time as well.  Since then she has had worsening posterolateral left shoulder pain with radiation to the mid humerus region.  It is affecting her sleep and keeping her up at night.  No history of prior injury or surgery to this shoulder.  She does have some associated scapular pain but no numbness or tingling or neck pain.  She is able to lift her arm though she does note increased pain with abduction and external rotation of the shoulder.  She has been taking Tylenol  and ibuprofen  without much relief.  She is right-hand dominant.  Has a new popping sensation in the shoulder as well since the incident.  This bothers her with shoulder range of motion especially overhead.  Does have history of diabetes with last A1c 7.6.  She is currently retired.  In her free time she enjoys traveling and going to her grandkids sporting events..                ROS: All systems reviewed are negative as they relate to the chief complaint within the history of present illness.  Patient denies fevers or chills.  Assessment & Plan: Visit Diagnoses:  1. Acute pain of left shoulder     Plan: Patient is a 63 year old female who presents for evaluation of left shoulder pain.  Has had consistently worsening left shoulder pain in the last few weeks ever since a popping event when she heard  a loud pop while reaching behind her with her left arm to hook her bra.  Since then, she has had popping sensation along with increased shoulder pain that wakes her up at night.  On exam, there is a palpable clicking noted with passive motion of the shoulder as well as positive O'Brien sign.  She does not have any significant rotator cuff weakness but she does have asymmetric loss of motion of the left shoulder relative to the right.  Radiographs negative for any acute changes.  With her injury event and consistently worsening symptoms, plan for MRI arthrogram of the left shoulder to evaluate for labral tear versus rotator cuff tear.  Follow-up after MRI to review results with Dr. Rozelle Corning.  Follow-Up Instructions: No follow-ups on file.   Orders:  Orders Placed This Encounter  Procedures   XR Shoulder Left   DL FLUORO GUIDED NEEDLE PLC ASPIRATION / INJECTTION/LOC   MR Shoulder Left w/ contrast   No orders of the defined types were placed in this encounter.     Procedures: No procedures performed   Clinical Data: No additional findings.  Objective: Vital Signs: There were no vitals taken for this visit.  Physical Exam:  Constitutional: Patient appears well-developed HEENT:  Head: Normocephalic Eyes:EOM are normal Neck: Normal range  of motion Cardiovascular: Normal rate Pulmonary/chest: Effort normal Neurologic: Patient is alert Skin: Skin is warm Psychiatric: Patient has normal mood and affect  Ortho Exam: Ortho exam demonstrates left shoulder with 30 degrees X rotation, 90 degrees abduction, 120 degrees forward elevation passively and actively.  This compared with the right shoulder with 70 degrees X rotation, 120 degrees abduction, 160 degrees forward elevation passively and actively.  She has excellent rotator cuff strength of supra, infra, subscap of both shoulders with strength rated 5/5 bilaterally.  Bicep contour is equal compared left to right.  Axillary nerve intact with  deltoid firing.  She does have palpable clicking sensation noted with passive motion in the left shoulder region that is not present in the right shoulder.  She has tenderness over the bicipital groove rated moderate.  Positive O'Brien sign.  No tenderness over the [facility] joint.  2+ radial pulse of the left upper extremity.  Intact EPL, FPL, finger abduction, pronation/supination, bicep, tricep, deltoid.  Specialty Comments:  No specialty comments available.  Imaging: No results found.   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   Degenerative spondylolisthesis 12/08/2020   Arthritis of knee 12/25/2018   Chronic pain of right knee 06/06/2018   Pain of right heel 06/06/2018   Sacroiliac dysfunction 03/24/2015   Chest pain 02/02/2012   Leukocytosis 02/02/2012   Barrett's esophagus 04/28/2011   FATTY LIVER DISEASE 05/24/2010   SMOKER 09/08/2009   Depression, unspecified 04/02/2008   Controlled type 2 diabetes mellitus without complication, with long-term current use of insulin  (HCC) 12/25/2007   Hyperlipidemia 12/25/2007   OBESITY 12/25/2007   Essential hypertension 12/25/2007   INTERSTITIAL CYSTITIS 12/25/2007   DEGENERATIVE DISC DISEASE 12/25/2007   Past Medical History:  Diagnosis Date   Anxiety    Barrett esophagus    Dr. Grandville Lax in GI   Cancer [facility])    Skin Cancer   Chronic back pain    stenosis and DDD   DDD (degenerative disc disease)    s/p back surgeries and steroid injxns   Depression    takes Fluoxetine  daily   Diabetes mellitus    Victoza  daily   GERD (gastroesophageal reflux disease)    takes Nexium  daily   History of kidney stones    Hyperlipidemia    hasn't been on Pravastatin in  6-7wks    Hypertension    takes Amlodipine  and HCTZ daily   Interstitial cystitis    Joint pain    Joint swelling    Muscle spasm    takes Ativan  nightly as needed   Numbness    weakness in right leg   Pneumonia    fall 2014   PONV (postoperative nausea and vomiting)    Spinal headache    blood patch required    Urinary frequency     Family History  Problem Relation Age of Onset   Kidney disease Mother    Heart disease Mother        ischemic   Gallbladder disease Mother    Other Mother        rectovaginal fistula   Hypertension Father    Colon polyps Father    Allergies Sister    Asthma Sister  Past Surgical History:  Procedure Laterality Date   adhesions removed from abdomen     APPENDECTOMY     BACK SURGERY  12/2007   x 6-fusion x 2   BIOPSY  05/19/2022   Procedure: BIOPSY;  Surgeon: Evangeline Hilts, MD;  Location: [facility];  Service: Gastroenterology;;   BLADDER SUSPENSION N/A 01/30/2023   Procedure: TRANSVAGINAL TAPE (TVT) PROCEDURE;  Surgeon: Arma Lamp, MD;  Location[facility];  Service: Gynecology;  Laterality: N/A;  total time requested is 1 hour   CARDIAC CATHETERIZATION  2013   CESAREAN SECTION  1988/1989   CHOLECYSTECTOMY     COLONOSCOPY  2011   COLONOSCOPY WITH PROPOFOL  N/A 05/19/2022   Procedure: COLONOSCOPY WITH PROPOFOL ;  Surgeon: Evangeline Hilts, MD;  Location: [facility];  Service: Gastroenterology;  Laterality: N/A;   CORONARY PRESSURE/FFR STUDY N/A 04/19/2023   Procedure: CORONARY PRESSURE/FFR STUDY;  Surgeon: Millicent Ally, MD;  [facility]: [facility];  Service: Cardiovascular;  Laterality: N/A;   CYSTOSCOPY  10/27/2022   Procedure: CYSTOSCOPY;  Surgeon: Arma Lamp, MD;  Location: [facility];  Service: Gynecology;;   CYSTOSCOPY N/A 01/30/2023   Procedure: CYSTOSCOPY;  Surgeon: Arma Lamp, MD;  Location: [facility];  Service: Gynecology;  Laterality: N/A;    epidural injections     KNEE ARTHROSCOPY Bilateral    LAPAROSCOPIC LYSIS OF ADHESIONS N/A 04/15/2014   Procedure: LAPAROSCOPIC LYSIS OF ADHESION;  Surgeon: Brandy Cal. Cornett, MD;  Location: [facility];  Service: General;  Laterality: N/A;   LAPAROSCOPY N/A 04/15/2014   Procedure: LAPAROSCOPY DIAGNOSTIC;  Surgeon: Brandy Cal. Cornett, MD;  [facility]: [facility];  Service: General;  Laterality: N/A;   LEFT HEART CATH AND CORONARY ANGIOGRAPHY N/A 04/19/2023   Procedure: LEFT HEART CATH AND CORONARY ANGIOGRAPHY;  Surgeon: Millicent Ally, MD;  [facility];  Service: Cardiovascular;  Laterality: N/A;   LEFT HEART CATHETERIZATION WITH CORONARY ANGIOGRAM N/A 02/03/2012   Procedure: LEFT HEART CATHETERIZATION WITH CORONARY ANGIOGRAM;  Surgeon: Millicent Ally, MD;  Location: [facility];  Service: Cardiovascular;  Laterality: N/A;   LUMBAR FUSION Right 03/24/2015   Procedure: SI joint fusion - right ;  Surgeon: Tivis Forster, MD;  [facility];  Service: Neurosurgery;  Laterality: Right;  SI joint fusion - right    NECK SURGERY  12/2007   OOPHORECTOMY     TOTAL ABDOMINAL HYSTERECTOMY     TOTAL KNEE ARTHROPLASTY Right 12/25/2018   TOTAL KNEE ARTHROPLASTY Right 12/25/2018   Procedure: RIGHT TOTAL KNEE ARTHROPLASTY;  Surgeon: Jasmine Mesi, MD;  [facility];  Service: Orthopedics;  Laterality: Right;   upper gi endoscopy     URETHRAL CYST REMOVAL N/A 10/27/2022   Procedure: EXAM UNDER ANESTHESIA, URETHRAL DIVERTICULECTOMY, REPAIR OF BLADDER INJURY;  Surgeon: Arma Lamp, MD;  Location: [facility];  Service: Gynecology;  Laterality: N/A;   Social History   Occupational History   Occupation: on disability since March 2009 d/t back pain   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

## 2024-03-18 ENCOUNTER — Ambulatory Visit: Admitting: Orthopedic Surgery

## 2024-03-21 ENCOUNTER — Ambulatory Visit
Admission: RE | Admit: 2024-03-21 | Discharge: 2024-03-21 | Disposition: A | Discharge status: Home / Self Care | Source: Ambulatory Visit | Attending: Surgical | Admitting: Surgical

## 2024-03-21 ENCOUNTER — Ambulatory Visit: Admitting: Obstetrics and Gynecology

## 2024-03-21 DIAGNOSIS — M25512 Pain in left shoulder: Secondary | ICD-10-CM

## 2024-03-21 MED ORDER — IOPAMIDOL (ISOVUE-M 200) INJECTION 41%
10.0000 mL | Freq: Once | INTRAMUSCULAR | Status: AC
  Administered 2024-03-21: 10 mL via INTRA_ARTICULAR

## 2024-03-28 ENCOUNTER — Encounter: Payer: Self-pay | Admitting: Orthopedic Surgery

## 2024-03-28 ENCOUNTER — Other Ambulatory Visit: Payer: Self-pay

## 2024-03-28 ENCOUNTER — Ambulatory Visit: Admitting: Orthopedic Surgery

## 2024-03-28 DIAGNOSIS — M25512 Pain in left shoulder: Secondary | ICD-10-CM | POA: Diagnosis not present

## 2024-03-28 DIAGNOSIS — M65912 Unspecified synovitis and tenosynovitis, left shoulder: Secondary | ICD-10-CM | POA: Diagnosis not present

## 2024-03-28 NOTE — Progress Notes (Signed)
 Office Visit Note   Patient: [name]           Date of Birth: [birth year]           MRN: [id number] Visit Date: 03/28/2024 Requested by: Majorie Scrape, PA-C [address] [place],  [place] [address] PCP: Majorie Scrape, PA-C  Subjective: Chief Complaint  Patient presents with   Other    Review MRI    HPI: [name] is a 63 y.o. female who presents to the office reporting left shoulder pain.  Since she was last seen she has had an MRI scan of that left shoulder.  Rotator cuff intact.  A little bit of abnormality around the superior labrum..  She may have some degenerative tearing in that region.  Her blood glucose goes up with steroid injections.  She does report some weakness but no real neck symptoms.  Uses ibuprofen  and Flexeril .  Biceps tendon in the groove on the MRI scan              ROS: All systems reviewed are negative as they relate to the chief complaint within the history of present illness.  Patient denies fevers or chills.  Assessment & Plan: Visit Diagnoses:  1. Acute pain of left shoulder     Plan: Impression is left shoulder pain with pretty reasonable range of motion but possible degenerative SLAP tear.  We are to try ultrasound-guided Toradol  injection into the glenohumeral joint today and start physical therapy 1-2 times for left shoulder mobility and strengthening below shoulder level.  I think she would be okay to start that therapy in a week or 2 after she has had a little bit of time for that shot to take effect.  Surgical treatment could be considered for biceps tenodesis.  Follow-Up Instructions: No follow-ups on file.   Orders:  Orders Placed This Encounter  Procedures   US  Guided Needle Placement - No Linked Charges   Ambulatory referral to Occupational Therapy   No orders of the defined types were placed in this encounter.     Procedures: Large Joint Inj: L glenohumeral on 03/28/2024 3:46 PM Indications:  diagnostic evaluation and pain Details: 22 G 3.5 in needle, ultrasound-guided posterior approach  Arthrogram: No  Medications: 9 mL bupivacaine  0.5 %; 5 mL lidocaine  1 % Outcome: tolerated well, no immediate complications Procedure, treatment alternatives, risks and benefits explained, specific risks discussed. Consent was given by the patient. Immediately prior to procedure a time out was called to verify the correct patient, procedure, equipment, support staff and site/side marked as required. Patient was prepped and draped in the usual sterile fashion.    Toradol  injected   Clinical Data: No additional findings.  Objective: Vital Signs: There were no vitals taken for this visit.  Physical Exam:  Constitutional: Patient appears well-developed HEENT:  Head: Normocephalic Eyes:EOM are normal Neck: Normal range of motion Cardiovascular: Normal rate Pulmonary/chest: Effort normal Neurologic: Patient is alert Skin: Skin is warm Psychiatric: Patient has normal mood and affect  Ortho Exam: Ortho exam demonstrates full active and passive range of motion of the cervical spine.  She has positive grind testing on the left negative on the right.  No discrete AC joint tenderness left hand side versus right.  Shoulder range of motion is symmetric approximately 60/100/165.  Rotator cuff strength intact on that left-hand side to infraspinatus supraspinatus and subscap muscle testing  Specialty Comments:  No specialty comments available.  Imaging: US  Guided Needle  Placement - No Linked Charges Result Date: 03/28/2024 Ultrasound imaging demonstrates needle placement into the glenohumeral joint with injection of fluid into the joint and no complicating features. 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   Degenerative spondylolisthesis 12/08/2020   Arthritis of knee 12/25/2018   Chronic pain of right knee 06/06/2018   Pain of right heel 06/06/2018   Sacroiliac dysfunction 03/24/2015   Chest pain 02/02/2012   Leukocytosis 02/02/2012   Barrett's esophagus 04/28/2011   FATTY LIVER DISEASE 05/24/2010   SMOKER 09/08/2009   Depression, unspecified 04/02/2008   Controlled type 2 diabetes mellitus without complication, with long-term current use of insulin  (HCC) 12/25/2007   Hyperlipidemia 12/25/2007   OBESITY 12/25/2007   Essential hypertension 12/25/2007   INTERSTITIAL CYSTITIS 12/25/2007   DEGENERATIVE DISC DISEASE 12/25/2007   Past Medical History:  Diagnosis Date   Anxiety    Barrett esophagus    Dr. Grandville Lax in GI   Cancer [facility])    Skin Cancer   Chronic back pain    stenosis and DDD   DDD (degenerative disc disease)    s/p back surgeries and steroid injxns   Depression    takes Fluoxetine  daily   Diabetes mellitus    Victoza  daily   GERD (gastroesophageal reflux disease)    takes Nexium  daily   History of kidney stones    Hyperlipidemia    hasn't been on Pravastatin in 6-7wks    Hypertension    takes Amlodipine  and HCTZ daily   Interstitial cystitis    Joint pain    Joint swelling    Muscle spasm    takes Ativan  nightly as needed   Numbness    weakness in right leg   Pneumonia    fall 2014   PONV (postoperative nausea and vomiting)    Spinal headache    blood patch required    Urinary frequency     Family History  Problem Relation Age of Onset   Kidney disease Mother    Heart disease Mother        ischemic   Gallbladder disease Mother    Other Mother        rectovaginal fistula   Hypertension Father    Colon polyps Father    Allergies Sister    Asthma Sister     Past Surgical History:  Procedure Laterality Date   adhesions removed from  abdomen     APPENDECTOMY     BACK SURGERY  12/2007   x 6-fusion x 2   BIOPSY  05/19/2022   Procedure: BIOPSY;  Surgeon: Evangeline Hilts, MD;  Location: [facility];  Service: Gastroenterology;;   BLADDER SUSPENSION N/A 01/30/2023   Procedure: TRANSVAGINAL TAPE (TVT) PROCEDURE;  Surgeon: Arma Lamp, MD;  Location[facility];  Service: Gynecology;  Laterality: N/A;  total time requested is 1 hour   CARDIAC CATHETERIZATION  2013   CESAREAN SECTION  1988/1989   CHOLECYSTECTOMY     COLONOSCOPY  2011   COLONOSCOPY WITH PROPOFOL  N/A 05/19/2022   Procedure: COLONOSCOPY WITH PROPOFOL ;  Surgeon: Evangeline Hilts, MD;  Location: [facility];  Service: Gastroenterology;  Laterality: N/A;   CORONARY PRESSURE/FFR STUDY N/A 04/19/2023  Procedure: CORONARY PRESSURE/FFR STUDY;  Surgeon: Millicent Ally, MD;  Location: [facility] INVASIVE CV LAB;  Service: Cardiovascular;  Laterality: N/A;   CYSTOSCOPY  10/27/2022   Procedure: CYSTOSCOPY;  Surgeon: Arma Lamp, MD;  Location: [facility];  Service: Gynecology;;   CYSTOSCOPY N/A 01/30/2023   Procedure: CYSTOSCOPY;  Surgeon: Arma Lamp, MD;  Location: [facility];  Service: Gynecology;  Laterality: N/A;   epidural injections     KNEE ARTHROSCOPY Bilateral    LAPAROSCOPIC LYSIS OF ADHESIONS N/A 04/15/2014   Procedure: LAPAROSCOPIC LYSIS OF ADHESION;  Surgeon: Brandy Cal. Cornett, MD;  [facility];  Service: General;  Laterality: N/A;   LAPAROSCOPY N/A 04/15/2014   Procedure: LAPAROSCOPY DIAGNOSTIC;  Surgeon: Brandy Cal. Cornett, MD;  Location[facility];  Service: General;  Laterality: N/A;   LEFT HEART CATH AND CORONARY ANGIOGRAPHY N/A 04/19/2023   Procedure: LEFT HEART CATH AND CORONARY ANGIOGRAPHY;  Surgeon: Millicent Ally, MD;  [facility];  Service: Cardiovascular;  Laterality: N/A;   LEFT HEART CATHETERIZATION WITH CORONARY ANGIOGRAM N/A 02/03/2012   Procedure: LEFT HEART  CATHETERIZATION WITH CORONARY ANGIOGRAM;  Surgeon: Millicent Ally, MD;  Location: [facility] CATH LAB;  Service: Cardiovascular;  Laterality: N/A;   LUMBAR FUSION Right 03/24/2015   Procedure: SI joint fusion - right ;  Surgeon: Tivis Forster, MD;  [facility];  Service: Neurosurgery;  Laterality: Right;  SI joint fusion - right    NECK SURGERY  12/2007   OOPHORECTOMY     TOTAL ABDOMINAL HYSTERECTOMY     TOTAL KNEE ARTHROPLASTY Right 12/25/2018   TOTAL KNEE ARTHROPLASTY Right 12/25/2018   Procedure: RIGHT TOTAL KNEE ARTHROPLASTY;  Surgeon: Jasmine Mesi, MD;  Location: [facility] OR;  Service: Orthopedics;  Laterality: Right;   upper gi endoscopy     URETHRAL CYST REMOVAL N/A 10/27/2022   Procedure: EXAM UNDER ANESTHESIA, URETHRAL DIVERTICULECTOMY, REPAIR OF BLADDER INJURY;  Surgeon: Arma Lamp, MD;  Location: [facility];  Service: Gynecology;  Laterality: N/A;   Social History   Occupational History   Occupation: on disability since March 2009 d/t back pain   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

## 2024-03-31 MED ORDER — BUPIVACAINE HCL 0.5 % IJ SOLN
9.0000 mL | INTRAMUSCULAR | Status: AC | PRN
  Administered 2024-03-28: 9 mL via INTRA_ARTICULAR

## 2024-03-31 MED ORDER — LIDOCAINE HCL 1 % IJ SOLN
5.0000 mL | INTRAMUSCULAR | Status: AC | PRN
  Administered 2024-03-28: 5 mL

## 2024-04-23 ENCOUNTER — Ambulatory Visit: Admitting: Physical Therapy

## 2024-04-23 DIAGNOSIS — M6281 Muscle weakness (generalized): Secondary | ICD-10-CM

## 2024-04-23 DIAGNOSIS — R293 Abnormal posture: Secondary | ICD-10-CM

## 2024-04-23 DIAGNOSIS — M25512 Pain in left shoulder: Secondary | ICD-10-CM

## 2024-04-23 NOTE — Therapy (Signed)
 OUTPATIENT PHYSICAL THERAPY SHOULDER EVALUATION   Patient Name: [name] MRN: [id number] DOB:Apr 28, 1961, 63 y.o., female Today's Date: 04/23/2024  END OF SESSION:  PT End of Session - 04/23/24 1002     Visit Number 1    Number of Visits 16    Date for PT Re-Evaluation 06/21/24    Authorization Type [employer]    PT Start Time 0845    PT Stop Time 0930    PT Time Calculation (min) 45 min    Activity Tolerance Patient tolerated treatment well;Patient limited by pain    Behavior During Therapy WFL for tasks assessed/performed          Past Medical History:  Diagnosis Date   Anxiety    Barrett esophagus    Dr. Obie in GI   [facility])    Skin Cancer   Chronic back pain    stenosis and DDD   DDD (degenerative disc disease)    s/p back surgeries and steroid injxns   Depression    takes Fluoxetine  daily   Diabetes mellitus    Victoza  daily   GERD (gastroesophageal reflux disease)    takes Nexium  daily   History of kidney stones    Hyperlipidemia    hasn't been on Pravastatin in 6-7wks    Hypertension    takes Amlodipine  and HCTZ daily   Interstitial cystitis    Joint pain    Joint swelling    Muscle spasm    takes Ativan  nightly as needed   Numbness    weakness in right leg   Pneumonia    fall 2014   PONV (postoperative nausea and vomiting)    Spinal headache    blood patch required    Urinary frequency    Past Surgical History:  Procedure Laterality Date   adhesions removed from abdomen     APPENDECTOMY     BACK SURGERY  12/2007   x 6-fusion x 2   BIOPSY  05/19/2022   Procedure: BIOPSY;  Surgeon: Burnette Fallow, MD;  Location: [facility];  Service: Gastroenterology;;   BLADDER SUSPENSION N/A 01/30/2023   Procedure: TRANSVAGINAL TAPE (TVT) PROCEDURE;  Surgeon: Marilynne Rosaline SAILOR, MD;  Location: [facility];  Service: Gynecology;  Laterality: N/A;  total time requested is 1 hour   CARDIAC CATHETERIZATION  2013   CESAREAN SECTION   1988/1989   CHOLECYSTECTOMY     COLONOSCOPY  2011   COLONOSCOPY WITH PROPOFOL  N/A 05/19/2022   Procedure: COLONOSCOPY WITH PROPOFOL ;  Surgeon: Burnette Fallow, MD;  Location: [facility];  Service: Gastroenterology;  Laterality: N/A;   CORONARY PRESSURE/FFR STUDY N/A 04/19/2023   Procedure: CORONARY PRESSURE/FFR STUDY;  Surgeon: Burnard Debby LABOR, MD;  Location: [facility];  Service: Cardiovascular;  Laterality: N/A;   CYSTOSCOPY  10/27/2022   Procedure: CYSTOSCOPY;  Surgeon: Marilynne Rosaline SAILOR, MD;  Location: [facility];  Service: Gynecology;;   CYSTOSCOPY N/A 01/30/2023   Procedure: CYSTOSCOPY;  Surgeon: Marilynne Rosaline SAILOR, MD;  Location: [facility];  Service: Gynecology;  Laterality: N/A;   epidural injections     KNEE ARTHROSCOPY Bilateral    LAPAROSCOPIC LYSIS OF ADHESIONS N/A 04/15/2014   Procedure: LAPAROSCOPIC LYSIS OF ADHESION;  Surgeon: Debby LABOR. Cornett, MD;  Location: [facility];  Service: General;  Laterality: N/A;   LAPAROSCOPY N/A 04/15/2014   Procedure: LAPAROSCOPY DIAGNOSTIC;  Surgeon: Debby LABOR. Cornett, MD;  Location: [facility];  Service: General;  Laterality: N/A;   LEFT HEART  CATH AND CORONARY ANGIOGRAPHY N/A 04/19/2023   Procedure: LEFT HEART CATH AND CORONARY ANGIOGRAPHY;  Surgeon: Burnard Debby LABOR, MD;  Location: [facility];  Service: Cardiovascular;  Laterality: N/A;   LEFT HEART CATHETERIZATION WITH CORONARY ANGIOGRAM N/A 02/03/2012   Procedure: LEFT HEART CATHETERIZATION WITH CORONARY ANGIOGRAM;  Surgeon: Debby LABOR Burnard, MD;  Location: [facility];  Service: Cardiovascular;  Laterality: N/A;   LUMBAR FUSION Right 03/24/2015   Procedure: SI joint fusion - right ;  Surgeon: Darina Boehringer, MD;  Location: [facility];  Service: Neurosurgery;  Laterality: Right;  SI joint fusion - right    NECK SURGERY  12/2007   OOPHORECTOMY     TOTAL ABDOMINAL HYSTERECTOMY     TOTAL KNEE ARTHROPLASTY Right 12/25/2018   TOTAL KNEE ARTHROPLASTY Right  12/25/2018   Procedure: RIGHT TOTAL KNEE ARTHROPLASTY;  Surgeon: Addie Cordella Hamilton, MD;  Location: [facility];  Service: Orthopedics;  Laterality: Right;   upper gi endoscopy     URETHRAL CYST REMOVAL N/A 10/27/2022   Procedure: EXAM UNDER ANESTHESIA, URETHRAL DIVERTICULECTOMY, REPAIR OF BLADDER INJURY;  Surgeon: Marilynne Rosaline SAILOR, MD;  Location: [facility];  Service: Gynecology;  Laterality: N/A;   Patient Active Problem List   Diagnosis Date Noted   Recurrent UTI 11/16/2023   Dysuria 11/16/2023   Mild to Moderate Nonobstructing CAD 04/20/2023   Chest pain 04/18/2023   GI bleed 05/18/2022   Hematochezia 05/16/2022   Hyponatremia 05/16/2022   Hypokalemia 05/16/2022   Urethral diverticulum 05/16/2022   Degenerative spondylolisthesis 12/08/2020   Arthritis of knee 12/25/2018   Chronic pain of right knee 06/06/2018   Pain of right heel 06/06/2018   Sacroiliac dysfunction 03/24/2015   Chest pain 02/02/2012   Leukocytosis 02/02/2012   Barrett's esophagus 04/28/2011   FATTY LIVER DISEASE 05/24/2010   SMOKER 09/08/2009   Depression, unspecified 04/02/2008   Controlled type 2 diabetes mellitus without complication, with long-term current use of insulin  (HCC) 12/25/2007   Hyperlipidemia 12/25/2007   OBESITY 12/25/2007   Essential hypertension 12/25/2007   INTERSTITIAL CYSTITIS 12/25/2007   DEGENERATIVE DISC DISEASE 12/25/2007    PCP: Hartwell Area, PA-C   REFERRING PROVIDER: Addie Cordella Hamilton, MD   REFERRING DIAG:  Diagnosis  M25.512 (ICD-10-CM) - Acute pain of left shoulder    THERAPY DIAG:  Acute pain of left shoulder  Muscle weakness (generalized)  Abnormal posture  Rationale for Evaluation and Treatment: Rehabilitation  ONSET DATE:   SUBJECTIVE:  SUBJECTIVE  STATEMENT:  [name]  is a 63 y.o. female who presents for PT evaluation reporting left shoulder pain.  Pt's MRI scan of that left shoulder revealed Rotator Cuff intact.  Some abnormality around the superior labrum.  She may have some degenerative tearing in that region per Dr. Brion note. Pt reporting weakness. Pt has been using ibuprofen  and muscle relaxer for pain control.   PERTINENT HISTORY: Skin cancer, DDD, back pain, DM, anxiety, HTN, lumbar fusion, neck surgery, TKA Rt,  see other PMH  PAIN:  NPRS scale: 4/10, can reach 10/10 Pain location: Left shoulder Pain description: achy, can sharp/burning Aggravating factors: reaching, pulling, pushing Relieving factors: resting  PRECAUTIONS: None  WEIGHT BEARING RESTRICTIONS: No  FALLS:  Has patient fallen in last 6 months? No  LIVING ENVIRONMENT: Lives with: lives with their family and lives with their spouse Lives in: House/apartment Stairs: Yes: Internal: 15 steps; can reach both Has following equipment at home: None  OCCUPATION: Doesn't work  PLOF: Independent  PATIENT GOALS:Be able to return to PLOF  Next MD visit:   OBJECTIVE:   DIAGNOSTIC FINDINGS:  MRI: rotator cuff intact  PATIENT SURVEYS:  Patient-Specific Activity Scoring Scheme  0 represents "unable to perform." 10 represents "able to perform at prior level. 0 1 2 3 4 5 6 7 8 9  10 (Date and Score)   Activity Eval  04/23/24    1. Wash hair, fix hair 3    2. Put on a shirt over my head 3     3. Laying with arms over head 3   4. Close blinds  4   5.close doors 3   Score 3.2    Total score = sum of the activity scores/number of activities Minimum detectable change (90%CI) for average score = 2 points Minimum detectable change (90%CI) for single activity score = 3 points  COGNITION: Overall cognitive status: WFL     POSTURE: Rounded shoulders, forward head  UPPER EXTREMITY ROM:   ROM Right Eval 04/23/24 Left eval  Shoulder flexion 154 120   Shoulder extension 50 30  Shoulder abduction 170 112  Shoulder adduction    Shoulder internal rotation    Shoulder external rotation 68 44  Elbow flexion [facility] WFL  Elbow extension    Wrist flexion    Wrist extension    Wrist ulnar deviation    Wrist radial deviation    Wrist pronation    Wrist supination    (Blank rows = not tested)  UPPER EXTREMITY MMT:  MMT Right Eval 04/23/24 sitting Left eval  Shoulder flexion 11.0 ppsi 5.6 Ppsi c pain 8-9/10  Shoulder extension 10.6 ppsi 7.0 Ppsi c pain 8-9/10  Shoulder abduction 8.5 ppsi 4.5 ppsi C pain 8-9/10  Shoulder adduction    Shoulder internal rotation    Shoulder external rotation    Middle trapezius    Lower trapezius    Elbow flexion 25.6 ppsi 13.9 ppsi  Elbow extension    Wrist flexion    Wrist extension    Wrist ulnar deviation    Wrist radial deviation    Wrist pronation    Wrist supination    Grip strength (lbs)    (Blank rows = not tested)  SHOULDER SPECIAL TESTS: Empty can test: positive on left  Painful arc of motion   PALPATION:  TTP: left upper trap, left deltoid (middle), bicep tendon, left  TODAY'S TREATMENT:                                                                                                       DATE: 04/23/24: Therex:    HEP instruction/performance c cues for techniques, handout provided.  Trial set performed of each for comprehension and symptom assessment.  See below for exercise list   PATIENT EDUCATION: Education details: HEP, POC Person educated: Patient Education method: Explanation, Demonstration, Verbal cues, and Handouts Education comprehension: verbalized understanding, returned demonstration, and verbal cues required  HOME EXERCISE PROGRAM: Access Code: [email or web address] URL:  [email or web address] Date: 04/23/2024 Prepared by: Delon Lunger  Exercises - Standing Isometric Shoulder Flexion with Doorway - Arm Bent  - 2 x daily - 7 x weekly - 10 reps - 5-10 seconds hold - Standing Isometric Shoulder Internal Rotation with Towel Roll at Doorway  - 2 x daily - 7 x weekly - 10 reps - 5-10 seconds hold - Standing Isometric Shoulder External Rotation with Doorway and Towel Roll  - 2 x daily - 7 x weekly - 10 reps - 5-10 seconds hold - Standing Isometric Shoulder Extension with Doorway - Arm Bent  - 2 x daily - 7 x weekly - 10 reps - 5-10 seconds hold - Seated Bilateral Shoulder Flexion Towel Slide at Table Top  - 2 x daily - 7 x weekly - 2 sets - 10 reps - 3-5 seconds hold - Seated Shoulder Scaption Slide at Table Top with Forearm in Neutral  - 2 x daily - 7 x weekly - 2 sets - 10 reps - 3-5 seconds hold - Seated Shoulder External Rotation PROM on Table  - 2 x daily - 7 x weekly - 2 sets - 10 reps - 3-5 seconds hold  ASSESSMENT:  CLINICAL IMPRESSION: Patient is a 63 y.o. who comes to clinic with complaints of left shoulder pain with mobility, strength and movement coordination deficits that impair their ability to perform usual daily and recreational functional activities without increase difficulty/symptoms at this time.  Patient to benefit from skilled PT services to address impairments and limitations to improve to previous level of function without restriction secondary to condition.   OBJECTIVE IMPAIRMENTS: decreased ROM, decreased strength, increased edema, impaired UE functional use, postural dysfunction, and pain.   ACTIVITY LIMITATIONS: lifting, bathing, toileting, dressing, reach over head, and hygiene/grooming  PARTICIPATION LIMITATIONS: cleaning, laundry, driving, and community activity  PERSONAL FACTORS: see PMH above are also affecting patient's functional outcome.   REHAB POTENTIAL: Good  CLINICAL DECISION MAKING:  Stable/uncomplicated  EVALUATION COMPLEXITY: Low   GOALS: Goals reviewed with patient? Yes  SHORT TERM GOALS: (target date for Short term goals are 3 weeks 05/14/2024)  1.Patient will demonstrate independent use of home exercise program to maintain progress from in clinic treatments. Goal status: New  LONG TERM GOALS: (target dates for all long term goals are 8 weeks  06/21/2024 )   1. Patient will demonstrate/report pain at worst less than or equal to 2/10 to facilitate minimal limitation in daily activity secondary to pain symptoms. Goal status: New  2. Patient will demonstrate independent use of home exercise program to facilitate ability to maintain/progress functional gains from skilled physical therapy services. Goal status: New   3. Patient will demonstrate Patient specific functional scale avg > or = 5.2 to indicate reduced disability due to condition.  Goal status: New   4.  Patient will demonstrate left UE strength by 5 ppsi throughout to facilitate lifting, reaching, carrying at PLOF in daily activity.   Goal status: New   5.  Patient will demonstrate left shoulder flexion and abduction >/= 140 degrees s symptoms to facilitate usual overhead reaching, self care, dressing at PLOF.    Goal status: New        PLAN:  PT FREQUENCY: 1-2x/week  PT DURATION: 8 weeks  PLANNED INTERVENTIONS: Can include 02853- PT Re-evaluation, 97110-Therapeutic exercises, 97530- Therapeutic activity, [id number]- Neuromuscular re-education, 97535- Self Care, 97140- Manual therapy, [phone]- Gait training, [phone]- Orthotic Fit/training, [phone]- Canalith repositioning, [id number]- Aquatic Therapy, [phone]- Electrical stimulation (unattended), [id number] Physical performance testing, 97016- Vasopneumatic device, [id number]- Ultrasound, [id number]- Traction (mechanical), [id number]- Ionotophoresis 4mg /ml Dexamethasone ,  79439 - Needle insertion w/o injection 1 or 2 muscles, 20561 - Needle insertion w/o injection 3 or more muscles.    Patient/Family education, Balance training, Stair training, Taping, Dry Needling, Joint mobilization, Joint manipulation, Spinal manipulation, Spinal mobilization, Scar mobilization, Vestibular training, Visual/preceptual remediation/compensation, DME instructions, Cryotherapy, and Moist heat.  All performed as medically necessary.  All included unless contraindicated  PLAN FOR NEXT SESSION: Review HEP knowledge/results, shoulder ROM, strengthening to tolerance, mobs as needed    Delon JONELLE Lunger, PT, MPT 04/23/2024, 10:04 AM   Date of referral: 03/28/24 Referring provider: Addie Cordella Hamilton, MD  Referring diagnosis?  Diagnosis  M25.512 (ICD-10-CM) - Acute pain of left shoulder   Treatment diagnosis? (if different than referring diagnosis) M25.512, M62.81, R29.3  What was this (referring dx) caused by? Other: pt felt injury when reaching back to clasp bra  Lysle of Condition: Initial Onset (within last 3 months)   Laterality: Lt  Current Functional Measure Score: Patient Specific Functional Scale 3.2  Objective measurements identify impairments when they are compared to normal values, the uninvolved extremity, and prior level of function.  [x]  Yes  []  No  Objective assessment of functional ability: Moderate functional limitations   Briefly describe symptoms: pain with flexion, abd, rotation  How did symptoms start: reaching backward to clasp bra  Average pain intensity:  Last 24 hours: 8-9/10 last night  Past week: 5-8/10 over the last week depending activities  How often does the pt experience symptoms? Constantly  How much have the symptoms interfered with usual daily activities? Quite a bit  How has condition changed since care began at this facility? NA - initial visit  In general, how is the patients overall health? Excellent   BACK PAIN (STarT Back Screening Tool) No

## 2024-05-08 ENCOUNTER — Encounter: Admitting: Physical Therapy

## 2024-05-08 ENCOUNTER — Telehealth: Payer: Self-pay | Admitting: Physical Therapy

## 2024-05-08 NOTE — Telephone Encounter (Signed)
 I called pt after she did not show for her 8:00 PT appointment. I left message of an opening appointment this morning at 8:45 if that worked better for the pt. I left our clinic number [phone] for pt to follow up as needed or try to reschedule her missed visit.  Delon Lunger, PT, MPT 05/08/24 8:19 AM

## 2024-05-08 NOTE — Therapy (Deleted)
 OUTPATIENT PHYSICAL THERAPY SHOULDER   Patient Name: [name] MRN: [id number] DOB:Mar 10, 1961, 63 y.o., female Today's Date: 05/08/2024  END OF SESSION:    Past Medical History:  Diagnosis Date   Anxiety    Barrett esophagus    Dr. Obie in GI   Cancer [facility])    Skin Cancer   Chronic back pain    stenosis and DDD   DDD (degenerative disc disease)    s/p back surgeries and steroid injxns   Depression    takes Fluoxetine  daily   Diabetes mellitus    Victoza  daily   GERD (gastroesophageal reflux disease)    takes Nexium  daily   History of kidney stones    Hyperlipidemia    hasn't been on Pravastatin in 6-7wks    Hypertension    takes Amlodipine  and HCTZ daily   Interstitial cystitis    Joint pain    Joint swelling    Muscle spasm    takes Ativan  nightly as needed   Numbness    weakness in right leg   Pneumonia    fall 2014   PONV (postoperative nausea and vomiting)    Spinal headache    blood patch required    Urinary frequency    Past Surgical History:  Procedure Laterality Date   adhesions removed from abdomen     APPENDECTOMY     BACK SURGERY  12/2007   x 6-fusion x 2   BIOPSY  05/19/2022   Procedure: BIOPSY;  Surgeon: Burnette Fallow, MD;  Location: [facility];  Service: Gastroenterology;;   BLADDER SUSPENSION N/A 01/30/2023   Procedure: TRANSVAGINAL TAPE (TVT) PROCEDURE;  Surgeon: Marilynne Rosaline SAILOR, MD;  Location: [facility];  Service: Gynecology;  Laterality: N/A;  total time requested is 1 hour   CARDIAC CATHETERIZATION  2013   CESAREAN SECTION  1988/1989   CHOLECYSTECTOMY     COLONOSCOPY  2011   COLONOSCOPY WITH PROPOFOL  N/A 05/19/2022   Procedure: COLONOSCOPY WITH PROPOFOL ;  Surgeon: Burnette Fallow, MD;  Location: [facility];  Service: Gastroenterology;  Laterality: N/A;   CORONARY PRESSURE/FFR STUDY N/A 04/19/2023   Procedure: CORONARY PRESSURE/FFR STUDY;  Surgeon: Burnard Debby LABOR, MD;  Location: [facility];   Service: Cardiovascular;  Laterality: N/A;   CYSTOSCOPY  10/27/2022   Procedure: CYSTOSCOPY;  Surgeon: Marilynne Rosaline SAILOR, MD;  Location: [facility];  Service: Gynecology;;   CYSTOSCOPY N/A 01/30/2023   Procedure: CYSTOSCOPY;  Surgeon: Marilynne Rosaline SAILOR, MD;  Location: [facility];  Service: Gynecology;  Laterality: N/A;   epidural injections     KNEE ARTHROSCOPY Bilateral    LAPAROSCOPIC LYSIS OF ADHESIONS N/A 04/15/2014   Procedure: LAPAROSCOPIC LYSIS OF ADHESION;  Surgeon: Debby LABOR. Cornett, MD;  Location: [facility];  Service: General;  Laterality: N/A;   LAPAROSCOPY N/A 04/15/2014   Procedure: LAPAROSCOPY DIAGNOSTIC;  Surgeon: Debby LABOR. Cornett, MD;  Location: [facility];  Service: General;  Laterality: N/A;   LEFT HEART CATH AND CORONARY ANGIOGRAPHY N/A 04/19/2023   Procedure: LEFT HEART CATH AND CORONARY ANGIOGRAPHY;  Surgeon: Burnard Debby LABOR, MD;  Location: [facility];  Service: Cardiovascular;  Laterality: N/A;   LEFT HEART CATHETERIZATION WITH CORONARY ANGIOGRAM N/A 02/03/2012   Procedure: LEFT HEART CATHETERIZATION WITH CORONARY ANGIOGRAM;  Surgeon: Debby LABOR Burnard, MD;  Location: [facility] CATH [facility];  Service: Cardiovascular;  Laterality: N/A;   LUMBAR FUSION Right 03/24/2015   Procedure: SI joint fusion - right ;  Surgeon: Darina Boehringer,  MD;  Location: [facility];  Service: Neurosurgery;  Laterality: Right;  SI joint fusion - right    NECK SURGERY  12/2007   OOPHORECTOMY     TOTAL ABDOMINAL HYSTERECTOMY     TOTAL KNEE ARTHROPLASTY Right 12/25/2018   TOTAL KNEE ARTHROPLASTY Right 12/25/2018   Procedure: RIGHT TOTAL KNEE ARTHROPLASTY;  Surgeon: Addie Cordella Hamilton, MD;  Location: [facility] OR;  Service: Orthopedics;  Laterality: Right;   upper gi endoscopy     URETHRAL CYST REMOVAL N/A 10/27/2022   Procedure: EXAM UNDER ANESTHESIA, URETHRAL DIVERTICULECTOMY, REPAIR OF BLADDER INJURY;  Surgeon: Marilynne Rosaline SAILOR, MD;  Location: [facility];  Service:  Gynecology;  Laterality: N/A;   Patient Active Problem List   Diagnosis Date Noted   Recurrent UTI 11/16/2023   Dysuria 11/16/2023   Mild to Moderate Nonobstructing CAD 04/20/2023   Chest pain 04/18/2023   GI bleed 05/18/2022   Hematochezia 05/16/2022   Hyponatremia 05/16/2022   Hypokalemia 05/16/2022   Urethral diverticulum 05/16/2022   Degenerative spondylolisthesis 12/08/2020   Arthritis of knee 12/25/2018   Chronic pain of right knee 06/06/2018   Pain of right heel 06/06/2018   Sacroiliac dysfunction 03/24/2015   Chest pain 02/02/2012   Leukocytosis 02/02/2012   Barrett's esophagus 04/28/2011   FATTY LIVER DISEASE 05/24/2010   SMOKER 09/08/2009   Depression, unspecified 04/02/2008   Controlled type 2 diabetes mellitus without complication, with long-term current use of insulin  (HCC) 12/25/2007   Hyperlipidemia 12/25/2007   OBESITY 12/25/2007   Essential hypertension 12/25/2007   INTERSTITIAL CYSTITIS 12/25/2007   DEGENERATIVE DISC DISEASE 12/25/2007    PCP: Hartwell Area, PA-C   REFERRING PROVIDER: Addie Cordella Hamilton, MD   REFERRING DIAG:  Diagnosis  M25.512 (ICD-10-CM) - Acute pain of left shoulder    THERAPY DIAG:  No diagnosis found.  Rationale for Evaluation and Treatment: Rehabilitation  ONSET DATE:   SUBJECTIVE:                                                                                                                                                                                      SUBJECTIVE STATEMENT:     PERTINENT HISTORY: Skin cancer, DDD, back pain, DM, anxiety, HTN, lumbar fusion, neck surgery, TKA Rt,  see other PMH  PAIN:  NPRS scale: 4/10, can reach 10/10 Pain location: Left shoulder Pain description: achy, can sharp/burning Aggravating factors: reaching, pulling, pushing Relieving factors: resting  PRECAUTIONS: None  WEIGHT BEARING RESTRICTIONS: No  FALLS:  Has patient fallen in last 6 months? No  LIVING  ENVIRONMENT: Lives with: lives with their family and lives with their spouse Lives in: House/apartment Stairs: Yes: Internal:  15 steps; can reach both Has following equipment at home: None  OCCUPATION: Doesn't work  PLOF: Independent  PATIENT GOALS:Be able to return to PLOF  Next MD visit:   OBJECTIVE:   DIAGNOSTIC FINDINGS:  MRI: rotator cuff intact  PATIENT SURVEYS:  Patient-Specific Activity Scoring Scheme  0 represents "unable to perform." 10 represents "able to perform at prior level. 0 1 2 3 4 5 6 7 8 9  10 (Date and Score)   Activity Eval  04/23/24    1. Wash hair, fix hair 3    2. Put on a shirt over my head 3     3. Laying with arms over head 3   4. Close blinds  4   5.close doors 3   Score 3.2    Total score = sum of the activity scores/number of activities Minimum detectable change (90%CI) for average score = 2 points Minimum detectable change (90%CI) for single activity score = 3 points  COGNITION: Overall cognitive status: WFL     POSTURE: Rounded shoulders, forward head  UPPER EXTREMITY ROM:   ROM Right Eval 04/23/24 Left eval  Shoulder flexion 154 120  Shoulder extension 50 30  Shoulder abduction 170 112  Shoulder adduction    Shoulder internal rotation    Shoulder external rotation 68 44  Elbow flexion [facility] WFL  Elbow extension    Wrist flexion    Wrist extension    Wrist ulnar deviation    Wrist radial deviation    Wrist pronation    Wrist supination    (Blank rows = not tested)  UPPER EXTREMITY MMT:  MMT Right Eval 04/23/24 sitting Left eval  Shoulder flexion 11.0 ppsi 5.6 Ppsi c pain 8-9/10  Shoulder extension 10.6 ppsi 7.0 Ppsi c pain 8-9/10  Shoulder abduction 8.5 ppsi 4.5 ppsi C pain 8-9/10  Shoulder adduction    Shoulder internal rotation    Shoulder external rotation    Middle trapezius    Lower trapezius    Elbow flexion 25.6 ppsi 13.9 ppsi  Elbow extension    Wrist flexion    Wrist extension     Wrist ulnar deviation    Wrist radial deviation    Wrist pronation    Wrist supination    Grip strength (lbs)    (Blank rows = not tested)  SHOULDER SPECIAL TESTS: Empty can test: positive on left  Painful arc of motion   PALPATION:  TTP: left upper trap, left deltoid (middle), bicep tendon, left  TODAY'S TREATMENT:                                                                                                       DATE: 05/08/24: Therex:  TODAY'S TREATMENT:                                                                                                       DATE: 04/23/24: Therex:    HEP instruction/performance c cues for techniques, handout provided.  Trial set performed of each for comprehension and symptom assessment.  See below for exercise list   PATIENT EDUCATION: Education details: HEP, POC Person educated: Patient Education method: Explanation, Demonstration, Verbal cues, and Handouts Education comprehension: verbalized understanding, returned demonstration, and verbal cues required  HOME EXERCISE PROGRAM: Access Code: [email or web address] URL: [email or web address] Date: 04/23/2024 Prepared by: Delon Lunger  Exercises - Standing Isometric Shoulder Flexion with Doorway - Arm Bent  - 2 x daily - 7 x weekly - 10 reps - 5-10 seconds hold - Standing Isometric Shoulder Internal Rotation with Towel Roll at Doorway  - 2 x daily - 7 x weekly - 10 reps - 5-10 seconds hold - Standing Isometric Shoulder External Rotation with Doorway and Towel Roll  - 2 x daily - 7 x weekly - 10 reps - 5-10 seconds hold - Standing Isometric Shoulder Extension with Doorway - Arm Bent  - 2 x daily - 7 x weekly - 10 reps - 5-10 seconds hold - Seated Bilateral Shoulder Flexion Towel Slide at Table Top  -  2 x daily - 7 x weekly - 2 sets - 10 reps - 3-5 seconds hold - Seated Shoulder Scaption Slide at Table Top with Forearm in Neutral  - 2 x daily - 7 x weekly - 2 sets - 10 reps - 3-5 seconds hold - Seated Shoulder External Rotation PROM on Table  - 2 x daily - 7 x weekly - 2 sets - 10 reps - 3-5 seconds hold  ASSESSMENT:  CLINICAL IMPRESSION:       OBJECTIVE IMPAIRMENTS: decreased ROM, decreased strength, increased edema, impaired UE functional use, postural dysfunction, and pain.   ACTIVITY LIMITATIONS: lifting, bathing, toileting, dressing, reach over head, and hygiene/grooming  PARTICIPATION LIMITATIONS: cleaning, laundry, driving, and community activity  PERSONAL FACTORS: see PMH above are also affecting patient's functional outcome.   REHAB POTENTIAL: Good  CLINICAL DECISION MAKING: Stable/uncomplicated  EVALUATION COMPLEXITY: Low   GOALS: Goals reviewed with patient? Yes  SHORT TERM GOALS: (target date for Short term goals are 3 weeks 05/14/2024)  1.Patient will demonstrate independent use of home exercise program to maintain progress from in clinic treatments. Goal status: New  LONG TERM GOALS: (target dates for all long term goals are 8 weeks  06/21/2024 )   1. Patient will demonstrate/report pain at worst less than or equal to 2/10 to facilitate minimal limitation in daily activity secondary to pain symptoms. Goal status: New   2. Patient will demonstrate independent use of home exercise program to facilitate ability to maintain/progress functional gains from skilled physical therapy services. Goal status: New   3. Patient will demonstrate Patient specific functional scale avg > or = 5.2 to indicate reduced disability due to condition.  Goal status: New   4.  Patient will demonstrate left UE strength by 5 ppsi throughout to facilitate lifting, reaching, carrying at PLOF in daily activity.   Goal status: New   5.  Patient will demonstrate left shoulder flexion  and abduction >/= 140 degrees s symptoms to facilitate usual overhead reaching, self care, dressing at PLOF.    Goal status: New        PLAN:  PT FREQUENCY: 1-2x/week  PT DURATION: 8 weeks  PLANNED INTERVENTIONS: Can include 02853- PT Re-evaluation, 97110-Therapeutic exercises, 97530- Therapeutic activity, [id number]- Neuromuscular re-education, 97535- Self Care, 97140- Manual therapy, [phone]- Gait training, [phone]- Orthotic Fit/training, [phone]- Canalith repositioning, [id number]- Aquatic Therapy, [phone]- Electrical stimulation (unattended), [id number] Physical performance testing, 97016- Vasopneumatic device, [id number]- Ultrasound, [id number]- Traction (mechanical), [id number]- Ionotophoresis 4mg /ml Dexamethasone ,  [id number] - Needle insertion w/o injection 1 or 2 muscles, 20561 - Needle insertion w/o injection 3 or more muscles.   Patient/Family education, Balance training, Stair training, Taping, Dry Needling, Joint mobilization, Joint manipulation, Spinal manipulation, Spinal mobilization, Scar mobilization, Vestibular training, Visual/preceptual remediation/compensation, DME instructions, Cryotherapy, and Moist heat.  All performed as medically necessary.  All included unless contraindicated  PLAN FOR NEXT SESSION: Review HEP knowledge/results, shoulder ROM, strengthening to tolerance, mobs as needed    Delon JONELLE Lunger, PT, MPT 05/08/2024, 7:52 AM   Date of referral: 03/28/24 Referring provider: Addie Cordella Hamilton, MD  Referring diagnosis?  Diagnosis  M25.512 (ICD-10-CM) - Acute pain of left shoulder   Treatment diagnosis? (if different than referring diagnosis) M25.512, M62.81, R29.3  What was this (referring dx) caused by? Other: pt felt injury when reaching back to clasp bra  Lysle of Condition: Initial Onset (within last 3 months)   Laterality: Lt  Current Functional Measure Score: Patient Specific Functional Scale 3.2  Objective measurements identify impairments when they are compared to normal  values, the uninvolved extremity, and prior level of function.  [x]  Yes  []  No  Objective assessment of functional ability: Moderate functional limitations   Briefly describe symptoms: pain with flexion, abd, rotation  How did symptoms start: reaching backward to clasp bra  Average pain intensity:  Last 24 hours: 8-9/10 last night  Past week: 5-8/10 over the last week depending activities  How often does the pt experience symptoms? Constantly  How much have the symptoms interfered with usual daily activities? Quite a bit  How has condition changed since care began at this facility? NA - initial visit  In general, how is the patients overall health? Excellent   BACK PAIN (STarT Back Screening Tool) No

## 2024-05-15 ENCOUNTER — Encounter: Admitting: Physical Therapy

## 2024-05-22 ENCOUNTER — Encounter: Admitting: Physical Therapy

## 2024-05-29 ENCOUNTER — Encounter: Admitting: Physical Therapy

## 2024-06-05 ENCOUNTER — Encounter: Admitting: Rehabilitative and Restorative Service Providers"

## 2024-06-12 ENCOUNTER — Encounter: Admitting: Rehabilitative and Restorative Service Providers"

## 2024-08-05 NOTE — Telephone Encounter (Signed)
 Received from [employer] 4 Tres[employer]  U-100 5X35ml pens patient advised with pick-up times.  Lauraine NOVAK CMA

## 2024-08-08 ENCOUNTER — Emergency Department (HOSPITAL_BASED_OUTPATIENT_CLINIC_OR_DEPARTMENT_OTHER)

## 2024-08-08 ENCOUNTER — Other Ambulatory Visit: Payer: Self-pay

## 2024-08-08 ENCOUNTER — Encounter (HOSPITAL_BASED_OUTPATIENT_CLINIC_OR_DEPARTMENT_OTHER): Payer: Self-pay

## 2024-08-08 ENCOUNTER — Telehealth: Payer: Self-pay | Admitting: Orthopedic Surgery

## 2024-08-08 ENCOUNTER — Emergency Department (HOSPITAL_BASED_OUTPATIENT_CLINIC_OR_DEPARTMENT_OTHER)
Admission: EM | Admit: 2024-08-08 | Discharge: 2024-08-08 | Disposition: A | Discharge status: Home / Self Care | Source: Ambulatory Visit | Attending: Emergency Medicine | Admitting: Emergency Medicine

## 2024-08-08 DIAGNOSIS — R202 Paresthesia of skin: Secondary | ICD-10-CM | POA: Diagnosis not present

## 2024-08-08 DIAGNOSIS — Z91040 Latex allergy status: Secondary | ICD-10-CM | POA: Diagnosis not present

## 2024-08-08 DIAGNOSIS — M25512 Pain in left shoulder: Secondary | ICD-10-CM | POA: Diagnosis present

## 2024-08-08 DIAGNOSIS — Z794 Long term (current) use of insulin: Secondary | ICD-10-CM | POA: Diagnosis not present

## 2024-08-08 DIAGNOSIS — Z87891 Personal history of nicotine dependence: Secondary | ICD-10-CM | POA: Diagnosis not present

## 2024-08-08 DIAGNOSIS — Z7982 Long term (current) use of aspirin: Secondary | ICD-10-CM | POA: Diagnosis not present

## 2024-08-08 LAB — CBC WITH DIFFERENTIAL/PLATELET
Abs Immature Granulocytes: 0.02 K/uL (ref 0.00–0.07)
Basophils Absolute: 0.1 K/uL (ref 0.0–0.1)
Basophils Relative: 1 %
Eosinophils Absolute: 0.3 K/uL (ref 0.0–0.5)
Eosinophils Relative: 3 %
HCT: 36.5 % (ref 36.0–46.0)
Hemoglobin: 12.6 g/dL (ref 12.0–15.0)
Immature Granulocytes: 0 %
Lymphocytes Relative: 29 %
Lymphs Abs: 2.2 K/uL (ref 0.7–4.0)
MCH: 31.7 pg (ref 26.0–34.0)
MCHC: 34.5 g/dL (ref 30.0–36.0)
MCV: 91.7 fL (ref 80.0–100.0)
Monocytes Absolute: 0.6 K/uL (ref 0.1–1.0)
Monocytes Relative: 8 %
Neutro Abs: 4.5 K/uL (ref 1.7–7.7)
Neutrophils Relative %: 59 %
Platelets: 282 K/uL (ref 150–400)
RBC: 3.98 MIL/uL (ref 3.87–5.11)
RDW: 11.9 % (ref 11.5–15.5)
WBC: 7.6 K/uL (ref 4.0–10.5)
nRBC: 0 % (ref 0.0–0.2)

## 2024-08-08 LAB — BASIC METABOLIC PANEL WITH GFR
Anion gap: 12 (ref 5–15)
BUN: 16 mg/dL (ref 8–23)
CO2: 26 mmol/L (ref 22–32)
Calcium: 10 mg/dL (ref 8.9–10.3)
Chloride: 99 mmol/L (ref 98–111)
Creatinine, Ser: 0.84 mg/dL (ref 0.44–1.00)
GFR, Estimated: >60 mL/min (ref >60)
Glucose, Bld: 93 mg/dL (ref 70–99)
Potassium: 4.1 mmol/L (ref 3.5–5.1)
Sodium: 137 mmol/L (ref 135–145)

## 2024-08-08 LAB — TROPONIN T, HIGH SENSITIVITY: Troponin T High Sensitivity: <15 ng/L (ref 0–19)

## 2024-08-08 MED ORDER — KETOROLAC TROMETHAMINE 15 MG/ML IJ SOLN: 15.0000 mg | Freq: Once | INTRAMUSCULAR | Status: DC

## 2024-08-08 MED ORDER — KETOROLAC TROMETHAMINE 30 MG/ML IJ SOLN
30.0000 mg | Freq: Once | INTRAMUSCULAR | Status: AC
  Administered 2024-08-08: 30 mg via INTRAMUSCULAR
  Filled 2024-08-08: qty 1

## 2024-08-08 NOTE — Discharge Instructions (Signed)
 Follow-up with your orthopedic specialist as scheduled in regard to your acute on chronic left shoulder pain.  You were treated with Toradol  in the emergency department today, please avoid using any additional NSAIDs today.  You are prescribed Naproxen as needed for your chronic back pain, you may use this as needed for your shoulder pain. Return to the emergency department if your symptoms worsen.

## 2024-08-08 NOTE — ED Notes (Signed)
 X-ray at bedside.

## 2024-08-08 NOTE — ED Provider Notes (Signed)
 [place] EMERGENCY DEPARTMENT AT [facility] Provider Note   CSN: [id number] Arrival date & time: 08/08/24  1254     Patient presents with: Arm Problem (left) and Tingling   [name] is a 63 y.o. female.   63 year old female presenting with left shoulder/arm pain.  Patient with history of left shoulder injury earlier in the summer, was diagnosed with labral tear with associated tendinosis.  Patient is seen by Dr. Addie, contacted their office this morning requesting a shoulder injection for her pain and was told to come to the emergency department to have this done as she cannot be seen in the office for several more weeks.  Patient denies any repeated injury, however does feel like she may have been over reaching for something with her left arm yesterday that may have triggered her symptoms.  She reports inability to sleep last night secondary to pain.  She took 2 Norco this morning as well as a Flexeril , which did ease her pain but did not resolve it entirely.  She notes that the pain is different from her typical shoulder pain, as it radiates down her arm and is associated with a sensory deficit to her upper arm as well as tingling in the tips of her second/3rd/4th digit.  Denies chest pain, shortness of breath.        Prior to Admission medications   Medication Sig Start Date End Date Taking? Authorizing Provider  acetaminophen  (TYLENOL ) 500 MG tablet Take 1 tablet (500 mg total) by mouth every 6 (six) hours as needed (pain). 09/28/22   Marilynne Rosaline SAILOR, MD  aspirin  EC 81 MG tablet Take 1 tablet (81 mg total) by mouth daily. Swallow whole. 04/21/23   Krishnan, Gokul, MD  benzonatate  (TESSALON ) 100 MG capsule Take 1 capsule (100 mg total) by mouth every 8 (eight) hours. 11/02/23   Mannie Pac T, DO  cyclobenzaprine  (FLEXERIL ) 10 MG tablet Take 1 tablet (10 mg total) by mouth 3 (three) times daily as needed for muscle spasms. 12/09/20   Louis Shove, MD   doxycycline  (VIBRAMYCIN ) 100 MG capsule Take 1 capsule (100 mg total) by mouth 2 (two) times daily. Take with food as can cause GI distress. 12/04/23   Zuleta, Kaitlin G, NP  DULoxetine  (CYMBALTA ) 20 MG capsule Take 20 mg by mouth daily.    [provider]  estradiol  (ESTRACE ) 0.1 MG/GM vaginal cream Place 0.5g nightly for two weeks then twice a week after 11/16/23   Marilynne Rosaline SAILOR, MD  ezetimibe  (ZETIA ) 10 MG tablet Take 1 tablet (10 mg total) by mouth daily. 05/15/23   Jeffrie Oneil BROCKS, MD  fluorouracil (EFUDEX) 5 % cream Apply 1 Application topically 2 (two) times daily. 04/27/23   [provider]  FLUoxetine  (PROZAC ) 40 MG capsule Take 1 capsule (40 mg total) by mouth daily. NEEDS APPT FOR REFILLS 07/10/12   Darlean Ozell NOVAK, MD  HYDROcodone -acetaminophen  (NORCO/VICODIN) 5-325 MG tablet 1 po q 4-6hrs prn pain Patient taking differently: Take 1 tablet by mouth every 4 (four) hours as needed for moderate pain (pain score 4-6). 01/04/19   Addie Cordella Hamilton, MD  insulin  lispro (HUMALOG ) 100 UNIT/ML injection Inject 2 Units into the skin See admin instructions. Inject 2 units SQ up to 6 times daily as needed for BS > 250    [provider]  isosorbide  mononitrate (IMDUR ) 30 MG 24 hr tablet Take 1 tablet (30 mg total) by mouth daily. 07/19/23   Jeffrie Oneil BROCKS, MD  metoprolol  succinate (TOPROL -XL) 25 MG 24 hr tablet Take 1 tablet (25 mg total) by mouth daily. 07/19/23   Jeffrie Oneil BROCKS, MD  MOUNJARO 10 MG/0.5ML Pen Inject 10 mg into the skin. 02/09/24   [provider]  nitroGLYCERIN  (NITROSTAT ) 0.4 MG SL tablet Place 1 tablet (0.4 mg total) under the tongue every 5 (five) minutes as needed for chest pain. 04/21/23   Swinyer, Rosaline HERO, NP  OZEMPIC , 1 MG/DOSE, 4 MG/3ML SOPN Inject 2 mg into the skin once a week. 01/18/22   [provider]  pantoprazole  (PROTONIX ) 40 MG tablet Take 40 mg by mouth 2 (two) times daily. 11/17/20   [provider]   phenazopyridine  (PYRIDIUM ) 200 MG tablet Take 1 tablet (200 mg total) by mouth 3 (three) times daily as needed for pain. 09/12/23   Marilynne Rosaline SAILOR, MD  rosuvastatin  (CRESTOR ) 5 MG tablet Take 1 tablet (5 mg total) by mouth daily. 02/21/24   Jeffrie Oneil BROCKS, MD  TRESIBA  FLEXTOUCH 100 UNIT/ML SOPN FlexTouch Pen Inject 50 Units into the skin daily.  08/19/18   [provider]  trospium  (SANCTURA ) 20 MG tablet Take 1 tablet (20 mg total) by mouth 2 (two) times daily. 09/19/23   Marilynne Rosaline SAILOR, MD    Allergies: Latex, Penicillins, Erythromycin, Ofloxacin, Sulfonamide derivatives, Azithromycin , Fenofibrate, and Statins    Review of Systems  Updated Vital Signs  Vitals:   08/08/24 1307 08/08/24 1308 08/08/24 1309 08/08/24 1311  BP:    (!) 156/87  Pulse: (!) 59     Resp: 20     Temp:   98.2 F (36.8 C)   TempSrc:   Oral   SpO2:  100%       Physical Exam Vitals and nursing note reviewed.  HENT:     Head: Normocephalic.  Eyes:     Extraocular Movements: Extraocular movements intact.  Cardiovascular:     Rate and Rhythm: Normal rate and regular rhythm.     Heart sounds: Normal heart sounds.  Pulmonary:     Effort: Pulmonary effort is normal.     Breath sounds: Normal breath sounds.  Musculoskeletal:     Cervical back: Normal range of motion.     Comments: LUE: Limited overhead reach and abduction secondary to pain, otherwise full active and passive ROM. No bony deformity. Sensory deficit to region of lateral deltoid as compared to opposite UE.  5/5 strength against resistance of bilateral upper extremities Grip strength in tact and equal bilaterally  Skin:    General: Skin is warm and dry.  Neurological:     Mental Status: She is alert and oriented to person, place, and time.     (all labs ordered are listed, but only abnormal results are displayed) Labs Reviewed  CBC WITH DIFFERENTIAL/PLATELET  BASIC METABOLIC PANEL WITH GFR  TROPONIN T, HIGH  SENSITIVITY    EKG: EKG Interpretation Date/Time:  Thursday August 08 2024 13:14:47 EDT Ventricular Rate:  64 PR Interval:  154 QRS Duration:  88 QT Interval:  440 QTC Calculation: 444 R Axis:   53  Text Interpretation: Sinus rhythm Confirmed by Ruthe Cornet [phone]) on 08/08/2024 1:18:10 PM  Radiology: [facility] Shoulder Left Portable Result Date: 08/08/2024 CLINICAL DATA:  Left shoulder pain. Rule out ACS. History of labrum tear. EXAM: LEFT SHOULDER COMPARISON:  03/01/2024 FINDINGS: Mild degenerative changes in the left shoulder with small osteophyte formation on the humeral head. No evidence of acute fracture or dislocation. Small calcification at the lateral humeral head  may represent calcific tendinosis. Coracoclavicular, acromioclavicular, and subacromial spaces are normal. Soft tissues are unremarkable. IMPRESSION: Mild degenerative changes in the left shoulder. Soft tissue calcification may represent calcific tendinosis. Similar appearance to previous study. Electronically Signed   By: Elsie Gravely M.D.   On: 08/08/2024 16:04   DG Chest Portable 1 View Result Date: 08/08/2024 CLINICAL DATA:  Left shoulder pain.  Rule out ACS. EXAM: PORTABLE CHEST 1 VIEW COMPARISON:  11/02/2023 FINDINGS: Slightly shallow inspiration. Heart size and pulmonary vascularity are normal. Lungs are clear. No pleural effusion or pneumothorax. Mediastinal contours appear intact. Degenerative changes in the spine. Postoperative change in the cervical spine. IMPRESSION: No active disease. Electronically Signed   By: Elsie Gravely M.D.   On: 08/08/2024 15:57     Procedures   Medications Ordered in the ED  ketorolac  (TORADOL ) 30 MG/ML injection 30 mg (has no administration in time range)                                    Medical Decision Making This patient presents to the ED for concern of left shoulder pain, this involves an extensive number of treatment options, and is a complaint that carries with  it a high risk of complications and morbidity.  The differential diagnosis includes chronic left shoulder pain, rotator cuff injury/tear, labral injury/tear, ACS   Co morbidities that complicate the patient evaluation  Chronic back pain, history of labral tear   Additional history obtained:  Additional history obtained from record review External records from outside source obtained and reviewed including previous orthopedic notes   Lab Tests:  I Ordered, and personally interpreted labs.  The pertinent results include: CBC within normal limits, no leukocytosis, normal hemoglobin.  BMP unremarkable.  Initial troponin <15, will not obtain repeat troponin given low suspicion for ACS.   Imaging Studies ordered:  I ordered imaging studies including XR L shoulder, XR chest  I independently visualized and interpreted imaging which showed  - XR L shoulder: Mild degenerative changes in the left shoulder. Soft tissue calcification may represent calcific tendinosis. Similar appearance to previous study. - XR chest: No active disease. I agree with the radiologist interpretation   Cardiac Monitoring: / EKG:  The patient was maintained on a cardiac monitor.  I personally viewed and interpreted the cardiac monitored which showed an underlying rhythm of: NSR  Problem List / ED Course / Critical interventions / Medication management  I ordered medication including toradol   for pain  I have reviewed the patients home medicines and have made adjustments as needed   Social Determinants of Health:  Former smoker   Test / Admission - Considered:  Physical exam notable as above.  Patient sees Dr. Addie with orthopedics, history of prior injury to this shoulder with acute exacerbation of her pain today.  Contacted their office and was told to come to the emergency department for a steroid injection, I informed the patient that an intra-articular steroid injection is not something that is  typically done in the emergency department, she expressed frustration that she was given this incorrect information by the orthopedic office.  Given the acute change in her pain without any real known trigger/inciting event, will also workup for ACS, although my suspicion is low. Cardiac workup is reassuring as above.  Left shoulder x-ray does not show any acute fracture/dislocation as compared to previous.   I suspect this is an acute  on chronic exacerbation of her left shoulder pain, will administer Toradol  prior to discharge.  Patient has prescription for naproxen at home to be used as needed for back pain, I recommend that she use this as needed for continued shoulder pain until she is able to be evaluated by her orthopedic specialist.  She voiced understanding and is in agreement with this plan.  Return precautions discussed.  She is appropriate for discharge at this time.    Amount and/or Complexity of Data Reviewed Labs: ordered. Radiology: ordered.  Risk Prescription drug management.        Final diagnoses:  Left shoulder pain, unspecified chronicity    ED Discharge Orders     None          Glendia Rocky LOISE DEVONNA 08/08/24 1633    Ruthe Cornet, DO 08/16/24 [phone]

## 2024-08-08 NOTE — ED Triage Notes (Addendum)
 Arrives ambulatory to the ED with complaints of left arm pain and tingling x1 day. Patient had issues with arm causing her to need therapy. Also reports neck and back surgeries.   ---took a muscle relaxant and pain meds pta

## 2024-08-08 NOTE — Telephone Encounter (Signed)
 Patient called and said she feels like she is getting the run around and all she is trying to get an injection for her shoulder. Cortisone shot. They sent her to [facility] and they gave her Toradol . CB#[phone]

## 2024-08-12 NOTE — Telephone Encounter (Signed)
 Okay to work in for what ever date for glenohumeral injection

## 2024-08-12 NOTE — Telephone Encounter (Signed)
 Pt was worked in

## 2024-08-15 ENCOUNTER — Encounter: Payer: Self-pay | Admitting: Surgical

## 2024-08-15 ENCOUNTER — Other Ambulatory Visit: Payer: Self-pay

## 2024-08-15 ENCOUNTER — Ambulatory Visit: Admitting: Surgical

## 2024-08-15 DIAGNOSIS — S43432A Superior glenoid labrum lesion of left shoulder, initial encounter: Secondary | ICD-10-CM

## 2024-08-15 MED ORDER — TRIAMCINOLONE ACETONIDE 40 MG/ML IJ SUSP
40.0000 mg | INTRAMUSCULAR | Status: AC | PRN
  Administered 2024-08-15: 40 mg via INTRA_ARTICULAR

## 2024-08-15 MED ORDER — LIDOCAINE HCL 1 % IJ SOLN
5.0000 mL | INTRAMUSCULAR | Status: AC | PRN
  Administered 2024-08-15: 5 mL

## 2024-08-15 MED ORDER — BUPIVACAINE HCL 0.25 % IJ SOLN
9.0000 mL | INTRAMUSCULAR | Status: AC | PRN
  Administered 2024-08-15: 9 mL via INTRA_ARTICULAR

## 2024-08-15 NOTE — Progress Notes (Signed)
   Procedure Note  Patient: [name]             Date of Birth: Aug 11, 1961           MRN: [id number]             Visit Date: 08/15/2024  Procedures: Visit Diagnoses:  1. 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 Consent was given by the patient. Immediately prior to procedure a time out was called to verify the correct patient, procedure, equipment, support staff and site/side marked as required. Patient was prepped and draped in the usual sterile fashion.

## 2024-09-02 ENCOUNTER — Encounter: Payer: Self-pay | Admitting: Radiology

## 2024-09-06 ENCOUNTER — Telehealth: Payer: Self-pay | Admitting: Cardiology

## 2024-09-06 NOTE — Telephone Encounter (Signed)
 Spoke to patient she stated she has lost 50 lbs this year.Stated for the past 1 week she has been having palpitations.She has been dizzy when she stands up.PCP advised her to see Dr.Skains.Appointment scheduled with Dr.Skains 11/11 at 11:40 am.

## 2024-09-06 NOTE — Telephone Encounter (Signed)
  Per MyChart scheduling message:   Patient c/o Palpitations: STAT if patient c/o lightheadedness, shortness of breath, or chest pain  How long have you had palpitations/irregular HR/ Afib? Are you having the symptoms now?   Are you currently experiencing lightheadedness, SOB or CP?   Do you have a history of afib (atrial fibrillation) or irregular heart rhythm?   Have you checked your BP or HR? (document readings if available):   Are you experiencing any other symptoms?    I saw my primary doctor today. I was thinking it may have something to do with my recent weight loss. She checked my BP and it was fine. I do not not have afib.  It's seems to do it when I drink caffeine or eat sugar. The dizziness comes when I stand up. Just the past few days it comes in waves also when I am walking. I get short of breath during those episodes. It's a hard heart beat with a pause I feel in my neck. This all started a week to 10 days ago.

## 2024-09-10 ENCOUNTER — Ambulatory Visit: Attending: Cardiology | Admitting: Cardiology

## 2024-09-10 ENCOUNTER — Encounter: Payer: Self-pay | Admitting: Cardiology

## 2024-09-10 VITALS — BP 126/79 | HR 64 | Ht 66.0 in | Wt 170.0 lb

## 2024-09-10 DIAGNOSIS — I10 Essential (primary) hypertension: Secondary | ICD-10-CM

## 2024-09-10 DIAGNOSIS — R002 Palpitations: Secondary | ICD-10-CM

## 2024-09-10 DIAGNOSIS — E782 Mixed hyperlipidemia: Secondary | ICD-10-CM | POA: Diagnosis not present

## 2024-09-10 DIAGNOSIS — I2510 Atherosclerotic heart disease of native coronary artery without angina pectoris: Secondary | ICD-10-CM

## 2024-09-10 NOTE — Patient Instructions (Signed)
 Medication Instructions:  Please discontinue your Imdur  (Isosorbide ). Continue all other medications as listed.  *If you need a refill on your cardiac medications before your next appointment, please call your pharmacy*  Follow-Up: At [facility], you and your health needs are our priority.  As part of our continuing mission to provide you with exceptional heart care, our providers are all part of one team.  This team includes your primary Cardiologist (physician) and Advanced Practice Providers or APPs (Physician Assistants and Nurse Practitioners) who all work together to provide you with the care you need, when you need it.  Your next appointment:   1 year(s)  Provider:   Oneil Parchment, MD    We recommend signing up for the patient portal called MyChart.  Sign up information is provided on this After Visit Summary.  MyChart is used to connect with patients for Virtual Visits (Telemedicine).  Patients are able to view lab/test results, encounter notes, upcoming appointments, etc.  Non-urgent messages can be sent to your provider as well.   To learn more about what you can do with MyChart, go to [email or web address].

## 2024-09-10 NOTE — Progress Notes (Signed)
 Cardiology Office Note:  .   Date:  09/10/2024  ID:  [name], DOB 10-Apr-1961, MRN [id number] PCP: Hartwell Elvie RIGGERS  [facility] Providers Cardiologist:  Wendy Parchment, MD     History of Present Illness: .   [name] is a 63 y.o. female Discussed the use of AI scribe History of Present Illness [name] is a 63 year old female with type 2 diabetes, hypertension, and hyperlipidemia who presents with palpitations and dizziness. She was referred by her regular doctor for further evaluation of her cardiac symptoms.  She experiences palpitations and dizziness, particularly when standing up, which began approximately one month ago. The palpitations are described as a 'hard heartbeat' followed by a pause, felt in her neck and throat, and are sometimes accompanied by shortness of breath. These symptoms occur at least once or twice a day and can happen at any time, including when walking.  She has a history of near syncope and underwent a left heart catheterization in June 2024 due to severe chest pain, shoulder pain, and jaw pain that woke her from sleep. The catheterization revealed mild to moderate diffuse coronary artery calcification, with a 70% stenosis in the ramus lesion and a 50% stenosis in the osteo LAD to proximal LAD. The left main had mild 5% narrowing. Troponins were negative, and she was treated medically. An echocardiogram showed normal pump function.  She has lost 50 pounds over the past year and a half, which she attributes to lifestyle changes.  Her current medications include metoprolol  and isosorbide , which she believes may be contributing to her dizziness. She recently started taking a magnesium  supplement.  No fainting or losing consciousness during episodes of dizziness. The dizziness is most pronounced when transitioning from sitting or lying down to standing, and sometimes when bending over or tying her shoes.      ROS: No  CP  Studies Reviewed: .        Results DIAGNOSTIC Left heart catheterization: Mild to moderate diffuse coronary artery calcification, 70% stenosis in the ramus lesion, 50% stenosis in the osteo LAD to prox LAD, mild 5% narrowing in the left main (04/2023)  Echocardiogram: Normal left ventricular function Risk Assessment/Calculations:            Physical Exam:   VS:  BP 126/79   Pulse 64   Ht 5' 6 (1.676 m)   Wt 170 lb (77.1 kg)   SpO2 98%   BMI 27.44 kg/m    Wt Readings from Last 3 Encounters:  09/10/24 170 lb (77.1 kg)  10/16/23 202 lb (91.6 kg)  09/14/23 194 lb (88 kg)    GEN: Well nourished, well developed in no acute distress NECK: No JVD; No carotid bruits CARDIAC: RRR, no murmurs, no rubs, no gallops RESPIRATORY:  Clear to auscultation without rales, wheezing or rhonchi  ABDOMEN: Soft, non-tender, non-distended EXTREMITIES:  No edema; No deformity   ASSESSMENT AND PLAN: .    Assessment and Plan Assessment & Plan Premature ventricular contractions with associated palpitations and dizziness Intermittent palpitations and dizziness, likely due to PVCs. Symptoms include a sensation of a hard heartbeat followed by a pause, occurring one to two times daily. No syncope reported. PVCs are benign and not indicative of a heart attack. Dizziness may be unrelated to PVCs and could be due to hypotension from medication or weight loss. - Continue magnesium  supplement in the evening to aid sleep and potentially reduce PVCs. - Avoid caffeine and sugar to  prevent triggering PVCs. - Ensure adequate hydration to help maintain blood pressure. - Consider wearing support socks to help maintain blood pressure.  Coronary artery disease with history of left heart catheterization and diffuse coronary calcification Coronary artery disease with previous left heart catheterization showing mild to moderate diffuse coronary artery calcification.  Hypertension with medication adjustment due to  symptomatic hypotension and weight loss Hypertension management complicated by symptomatic hypotension and significant weight loss. Current medications include metoprolol  and isosorbide , which may contribute to low blood pressure and dizziness. Blood pressure in the office is 126/79 mmHg, but may be lower at home, especially during positional changes. - Discontinued isosorbide  to address symptomatic hypotension. - Continue metoprolol  as it may help calm PVCs. - Monitor blood pressure at home, especially during symptomatic episodes.         Dispo: 1 yr  Signed, Wendy Parchment, MD

## 2024-09-22 ENCOUNTER — Encounter: Payer: Self-pay | Admitting: Orthopedic Surgery

## 2024-09-23 NOTE — Telephone Encounter (Signed)
 The only thing I can do would be a Toradol  injection, not a cortisone injection like she had last time.  If she wants to do Toradol , I can do that today if she wants to come in.

## 2024-09-24 ENCOUNTER — Telehealth: Payer: Self-pay

## 2024-09-24 NOTE — Telephone Encounter (Signed)
 Patient called this morning stating that she saw the message about a Toradol  injection and would like to why she is not able to receive a cortisone injection.  Stated that she was advised by Herlene to send a message through [facility] concerning getting a cortisone injection if she had the same issue again. CB# [phone].  Please advise.  Thank you.

## 2024-09-24 NOTE — Telephone Encounter (Signed)
 Thank you :)

## 2024-09-24 NOTE — Telephone Encounter (Signed)
 I sent her a response on MyChart

## 2024-10-17 ENCOUNTER — Ambulatory Visit: Admitting: Orthopedic Surgery

## 2024-10-17 ENCOUNTER — Other Ambulatory Visit: Payer: Self-pay

## 2024-10-17 DIAGNOSIS — M65912 Unspecified synovitis and tenosynovitis, left shoulder: Secondary | ICD-10-CM

## 2024-10-17 DIAGNOSIS — M25512 Pain in left shoulder: Secondary | ICD-10-CM

## 2024-10-18 ENCOUNTER — Encounter: Payer: Self-pay | Admitting: Orthopedic Surgery

## 2024-10-18 DIAGNOSIS — M65912 Unspecified synovitis and tenosynovitis, left shoulder: Secondary | ICD-10-CM

## 2024-10-18 MED ORDER — LIDOCAINE HCL 1 % IJ SOLN
5.0000 mL | INTRAMUSCULAR | Status: AC | PRN
  Administered 2024-10-18: 5 mL

## 2024-10-18 MED ORDER — BUPIVACAINE HCL 0.5 % IJ SOLN
9.0000 mL | INTRAMUSCULAR | Status: AC | PRN
  Administered 2024-10-18: 9 mL via INTRA_ARTICULAR

## 2024-10-18 MED ORDER — TRIAMCINOLONE ACETONIDE 40 MG/ML IJ SUSP
40.0000 mg | INTRAMUSCULAR | Status: AC | PRN
  Administered 2024-10-18: 40 mg via INTRA_ARTICULAR

## 2024-10-18 NOTE — Progress Notes (Signed)
 "  Office Visit Note   Patient: [name]           Date of Birth: [birth year]           MRN: [id number] Visit Date: 10/17/2024 Requested by: Hartwell Area, PA-C [address] [place],  [place] [address] PCP: Hartwell Area, PA-C  Subjective: Chief Complaint  Patient presents with   Left Shoulder - Pain    HPI: [name] is a 63 y.o. female who presents to the office reporting continued left shoulder and arm pain.  She had a glenohumeral joint injection 08/15/2024 which did help until she was getting the boxes out to decorate for Christmas.  CBG did go up with that glenohumeral injection but she was able to control it.  She feels a continued achiness and pain in that left shoulder.  Radiates into the biceps.  Fingers tingle at times.  Denies much in the way of neck pain.  She has done physical therapy and a home exercise program.  Hurts for her to extend the arm behind her back.  Does take Flexeril  ibuprofen  and Tylenol .  She is right-hand dominant..                ROS: All systems reviewed are negative as they relate to the chief complaint within the history of present illness.  Patient denies fevers or chills.  Assessment & Plan: Visit Diagnoses:  1. Acute pain of left shoulder     Plan: Impression is adhesive capsulitis in that left shoulder.  Second and final glenohumeral joint injection performed today.  Range of motion exercise sheets provided and I encouraged her to stretch the shoulder out is much as possible even if it is painful.  Will see her back as needed.  She has had an MRI scan of that left shoulder earlier this year which showed biceps tendinosis and mild tendinitis of the rotator cuff tendons but nothing definitively operative.  No AC joint symptoms today as well.  Follow-Up Instructions: No follow-ups on file.   Orders:  Orders Placed This Encounter  Procedures   US  Guided Needle Placement - No Linked Charges   No orders of the  defined types were placed in this encounter.     Procedures: Large Joint Inj: L glenohumeral on 10/18/2024 7:47 AM Indications: diagnostic evaluation and pain Details: 22 G 3.5 in needle, ultrasound-guided posterior approach  Arthrogram: No  Medications: 9 mL bupivacaine  0.5 %; 5 mL lidocaine  1 %; 40 mg triamcinolone  acetonide 40 MG/ML Outcome: tolerated well, no immediate complications Procedure, treatment alternatives, risks and benefits explained, specific risks discussed. Consent was given by the patient. Immediately prior to procedure a time out was called to verify the correct patient, procedure, equipment, support staff and site/side marked as required. Patient was prepped and draped in the usual sterile fashion.       Clinical Data: No additional findings.  Objective: Vital Signs: There were no vitals taken for this visit.  Physical Exam:  Constitutional: Patient appears well-developed HEENT:  Head: Normocephalic Eyes:EOM are normal Neck: Normal range of motion Cardiovascular: Normal rate Pulmonary/chest: Effort normal Neurologic: Patient is alert Skin: Skin is warm Psychiatric: Patient has normal mood and affect  Ortho Exam: Ortho exam demonstrates range of motion of the right 75/100/150 range of motion on the left 45/85/165.  Rotator cuff strength intact bilaterally to infraspinatus supraspinatus and subscap muscle testing.  No asymmetric AC joint tenderness on the left-hand side to direct palpation  or with crossarm adduction.  Cervical spine range of motion intact.  Specialty Comments:  No specialty comments available.  Imaging: US  Guided Needle Placement - No Linked Charges Result Date: 10/18/2024 Ultrasound imaging demonstrates needle placement into the glenohumeral joint with injection of fluid into the joint and no complicating features.     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   Degenerative spondylolisthesis 12/08/2020   Arthritis of knee 12/25/2018   Chronic pain of right knee 06/06/2018   Pain of right heel 06/06/2018   Sacroiliac dysfunction 03/24/2015   Chest pain 02/02/2012   Leukocytosis 02/02/2012   Barrett's esophagus 04/28/2011   FATTY LIVER DISEASE 05/24/2010   SMOKER 09/08/2009   Depression, unspecified 04/02/2008   Controlled type 2 diabetes mellitus without complication, with long-term current use of insulin  (HCC) 12/25/2007   Hyperlipidemia 12/25/2007   OBESITY 12/25/2007   Essential hypertension 12/25/2007   INTERSTITIAL CYSTITIS 12/25/2007   DEGENERATIVE DISC DISEASE 12/25/2007   Past Medical History:  Diagnosis Date   Anxiety    Barrett esophagus    Dr. Obie in GI   Cancer [facility])    Skin Cancer   Chronic back pain    stenosis and DDD   DDD (degenerative disc disease)    s/p back surgeries and steroid injxns   Depression    takes Fluoxetine  daily   Diabetes mellitus    Victoza  daily   GERD (gastroesophageal reflux disease)    takes Nexium  daily   History of kidney stones    Hyperlipidemia    hasn't been on Pravastatin in 6-7wks    Hypertension    takes Amlodipine  and HCTZ daily   Interstitial cystitis    Joint pain    Joint swelling    Muscle spasm    takes Ativan  nightly as needed   Numbness    weakness in right leg   Pneumonia    fall 2014   PONV (postoperative nausea and vomiting)    Spinal headache    blood patch required    Urinary frequency     Family History  Problem Relation Age of Onset   Kidney disease Mother    Heart disease Mother        ischemic   Gallbladder disease Mother    Other Mother        rectovaginal fistula   Hypertension Father    Colon polyps Father    Allergies  Sister    Asthma Sister     Past Surgical History:  Procedure Laterality Date   adhesions removed from abdomen     APPENDECTOMY     BACK SURGERY  12/2007   x 6-fusion x 2   BIOPSY  05/19/2022   Procedure: BIOPSY;  Surgeon: Burnette Fallow, MD;  Location: [facility];  Service: Gastroenterology;;   BLADDER SUSPENSION N/A 01/30/2023   Procedure: TRANSVAGINAL TAPE (TVT) PROCEDURE;  Surgeon: Marilynne Rosaline SAILOR, MD;  Location: [facility];  Service: Gynecology;  Laterality: N/A;  total time requested is 1 hour   CARDIAC CATHETERIZATION  2013   CESAREAN SECTION  1988/1989   CHOLECYSTECTOMY     COLONOSCOPY  2011   COLONOSCOPY WITH PROPOFOL  N/A 05/19/2022   Procedure: COLONOSCOPY WITH  PROPOFOL ;  Surgeon: Burnette Fallow, MD;  Location: [facility];  Service: Gastroenterology;  Laterality: N/A;   CORONARY PRESSURE/FFR STUDY N/A 04/19/2023   Procedure: CORONARY PRESSURE/FFR STUDY;  Surgeon: Burnard Debby LABOR, MD;  Location: [facility];  Service: Cardiovascular;  Laterality: N/A;   CYSTOSCOPY  10/27/2022   Procedure: CYSTOSCOPY;  Surgeon: Marilynne Rosaline SAILOR, MD;  Location: [facility];  Service: Gynecology;;   CYSTOSCOPY N/A 01/30/2023   Procedure: CYSTOSCOPY;  Surgeon: Marilynne Rosaline SAILOR, MD;  Location: [facility];  Service: Gynecology;  Laterality: N/A;   epidural injections     KNEE ARTHROSCOPY Bilateral    LAPAROSCOPIC LYSIS OF ADHESIONS N/A 04/15/2014   Procedure: LAPAROSCOPIC LYSIS OF ADHESION;  Surgeon: Debby LABOR. Cornett, MD;  Location: [facility];  Service: General;  Laterality: N/A;   LAPAROSCOPY N/A 04/15/2014   Procedure: LAPAROSCOPY DIAGNOSTIC;  Surgeon: Debby LABOR. Cornett, MD;  Location: [facility];  Service: General;  Laterality: N/A;   LEFT HEART CATH AND CORONARY ANGIOGRAPHY N/A 04/19/2023   Procedure: LEFT HEART CATH AND CORONARY ANGIOGRAPHY;  Surgeon: Burnard Debby LABOR, MD;  Location: [facility];  Service: Cardiovascular;   Laterality: N/A;   LEFT HEART CATHETERIZATION WITH CORONARY ANGIOGRAM N/A 02/03/2012   Procedure: LEFT HEART CATHETERIZATION WITH CORONARY ANGIOGRAM;  Surgeon: Debby LABOR Burnard, MD;  Location: [facility] CATH LAB;  Service: Cardiovascular;  Laterality: N/A;   LUMBAR FUSION Right 03/24/2015   Procedure: SI joint fusion - right ;  Surgeon: Darina Boehringer, MD;  Location: [facility];  Service: Neurosurgery;  Laterality: Right;  SI joint fusion - right    NECK SURGERY  12/2007   OOPHORECTOMY     TOTAL ABDOMINAL HYSTERECTOMY     TOTAL KNEE ARTHROPLASTY Right 12/25/2018   TOTAL KNEE ARTHROPLASTY Right 12/25/2018   Procedure: RIGHT TOTAL KNEE ARTHROPLASTY;  Surgeon: Addie Cordella Hamilton, MD;  Location[facility];  Service: Orthopedics;  Laterality: Right;   upper gi endoscopy     URETHRAL CYST REMOVAL N/A 10/27/2022   Procedure: EXAM UNDER ANESTHESIA, URETHRAL DIVERTICULECTOMY, REPAIR OF BLADDER INJURY;  Surgeon: Marilynne Rosaline SAILOR, MD;  Location: [facility];  Service: Gynecology;  Laterality: N/A;   Social History   Occupational History   Occupation: on disability since March 2009 d/t back pain   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

## 2024-11-02 ENCOUNTER — Emergency Department (HOSPITAL_BASED_OUTPATIENT_CLINIC_OR_DEPARTMENT_OTHER)

## 2024-11-02 ENCOUNTER — Other Ambulatory Visit: Payer: Self-pay

## 2024-11-02 ENCOUNTER — Encounter (HOSPITAL_BASED_OUTPATIENT_CLINIC_OR_DEPARTMENT_OTHER): Payer: Self-pay

## 2024-11-02 ENCOUNTER — Emergency Department (HOSPITAL_BASED_OUTPATIENT_CLINIC_OR_DEPARTMENT_OTHER)
Admission: EM | Admit: 2024-11-02 | Discharge: 2024-11-02 | Disposition: A | Discharge status: Home / Self Care | Attending: Emergency Medicine | Admitting: Emergency Medicine

## 2024-11-02 DIAGNOSIS — J019 Acute sinusitis, unspecified: Secondary | ICD-10-CM | POA: Insufficient documentation

## 2024-11-02 DIAGNOSIS — Z91040 Latex allergy status: Secondary | ICD-10-CM | POA: Diagnosis not present

## 2024-11-02 DIAGNOSIS — R519 Headache, unspecified: Secondary | ICD-10-CM

## 2024-11-02 DIAGNOSIS — Z7982 Long term (current) use of aspirin: Secondary | ICD-10-CM | POA: Insufficient documentation

## 2024-11-02 LAB — CBC WITH DIFFERENTIAL/PLATELET
Abs Immature Granulocytes: 0.04 K/uL (ref 0.00–0.07)
Basophils Absolute: 0.1 K/uL (ref 0.0–0.1)
Basophils Relative: 1 %
Eosinophils Absolute: 0.2 K/uL (ref 0.0–0.5)
Eosinophils Relative: 2 %
HCT: 37.4 % (ref 36.0–46.0)
Hemoglobin: 12.7 g/dL (ref 12.0–15.0)
Immature Granulocytes: 0 %
Lymphocytes Relative: 25 %
Lymphs Abs: 2.4 K/uL (ref 0.7–4.0)
MCH: 31.8 pg (ref 26.0–34.0)
MCHC: 34 g/dL (ref 30.0–36.0)
MCV: 93.7 fL (ref 80.0–100.0)
Monocytes Absolute: 0.5 K/uL (ref 0.1–1.0)
Monocytes Relative: 5 %
Neutro Abs: 6.5 K/uL (ref 1.7–7.7)
Neutrophils Relative %: 67 %
Platelets: 347 K/uL (ref 150–400)
RBC: 3.99 MIL/uL (ref 3.87–5.11)
RDW: 12.1 % (ref 11.5–15.5)
WBC: 9.6 K/uL (ref 4.0–10.5)
nRBC: 0 % (ref 0.0–0.2)

## 2024-11-02 LAB — BASIC METABOLIC PANEL WITH GFR
Anion gap: 11 (ref 5–15)
BUN: 19 mg/dL (ref 8–23)
CO2: 27 mmol/L (ref 22–32)
Calcium: 9.6 mg/dL (ref 8.9–10.3)
Chloride: 103 mmol/L (ref 98–111)
Creatinine, Ser: 0.88 mg/dL (ref 0.44–1.00)
GFR, Estimated: >60 mL/min
Glucose, Bld: 170 mg/dL — ABNORMAL HIGH (ref 70–99)
Potassium: 4 mmol/L (ref 3.5–5.1)
Sodium: 140 mmol/L (ref 135–145)

## 2024-11-02 MED ORDER — DEXAMETHASONE SOD PHOSPHATE PF 10 MG/ML IJ SOLN
10.0000 mg | Freq: Once | INTRAMUSCULAR | Status: AC
  Administered 2024-11-02: 10 mg via INTRAVENOUS

## 2024-11-02 MED ORDER — DIPHENHYDRAMINE HCL 50 MG/ML IJ SOLN
12.5000 mg | Freq: Once | INTRAMUSCULAR | Status: AC
  Administered 2024-11-02: 12.5 mg via INTRAVENOUS
  Filled 2024-11-02: qty 1

## 2024-11-02 MED ORDER — CLINDAMYCIN HCL 150 MG PO CAPS
300.0000 mg | ORAL_CAPSULE | Freq: Once | ORAL | Status: AC
  Administered 2024-11-02: 300 mg via ORAL
  Filled 2024-11-02: qty 2

## 2024-11-02 MED ORDER — METHYLPREDNISOLONE 4 MG PO TBPK: ORAL_TABLET | ORAL | 0 refills | Status: AC

## 2024-11-02 MED ORDER — KETOROLAC TROMETHAMINE 15 MG/ML IJ SOLN
15.0000 mg | Freq: Once | INTRAMUSCULAR | Status: AC
  Administered 2024-11-02: 15 mg via INTRAVENOUS
  Filled 2024-11-02: qty 1

## 2024-11-02 MED ORDER — CLINDAMYCIN HCL 300 MG PO CAPS: 300.0000 mg | ORAL_CAPSULE | Freq: Three times a day (TID) | ORAL | 0 refills | Status: AC

## 2024-11-02 MED ORDER — PROCHLORPERAZINE EDISYLATE 10 MG/2ML IJ SOLN
5.0000 mg | Freq: Once | INTRAMUSCULAR | Status: AC
  Administered 2024-11-02: 5 mg via INTRAVENOUS
  Filled 2024-11-02: qty 2

## 2024-11-02 MED ORDER — SODIUM CHLORIDE 0.9 % IV BOLUS
500.0000 mL | Freq: Once | INTRAVENOUS | Status: AC
  Administered 2024-11-02: 500 mL via INTRAVENOUS

## 2024-11-02 NOTE — ED Triage Notes (Signed)
 Patient reports worst headache of her life. Also reports left sided facial numbness, some on the right too, but worse on the left. Also has scalp pain and some eye pain. The numbness started 1 hour ago. The headache first began on New Years. Also reports recently having viral sinus infection.

## 2024-11-02 NOTE — ED Provider Notes (Signed)
 " [place] [facility] AT [facility] Provider Note   CSN: [id number] Arrival date & time: 11/02/24  1707     Patient presents with: Headache and Numbness   [name] is a 64 y.o. female.   Patient here with sinus congestion and some numbness to both sides of her face but little bit worse in the left midface and the right midface.  [name] just gotten over flulike illness but feels like her sinuses are really congested.  [name] has had headache for the last 3 days.  Denies chest pain shortness of breath weakness numbness tingling.  [name] has history of chronic low back problem and neck problems.  [name] denies any weakness otherwise.  No vision changes no speech changes.  Headaches pain for the last few days.  Numbness may be just today.  Feels like it is her sinuses that are congested.  The history is provided by the patient.       Prior to Admission medications  Medication Sig Start Date End Date Taking? Authorizing Provider  clindamycin  (CLEOCIN ) 300 MG capsule Take 1 capsule (300 mg total) by mouth 3 (three) times daily for 7 days. 11/02/24 11/09/24 Yes Jhania Etherington, DO  methylPREDNISolone  (MEDROL  DOSEPAK) 4 MG TBPK tablet Follow package insert 11/02/24  Yes Kaydance Bowie, DO  acetaminophen  (TYLENOL ) 500 MG tablet Take 1 tablet (500 mg total) by mouth every 6 (six) hours as needed (pain). 09/28/22   Marilynne Rosaline SAILOR, MD  aspirin  EC 81 MG tablet Take 1 tablet (81 mg total) by mouth daily. Swallow whole. 04/21/23   Krishnan, Gokul, MD  benzonatate  (TESSALON ) 100 MG capsule Take 1 capsule (100 mg total) by mouth every 8 (eight) hours. 11/02/23   Mannie Fairy DASEN, DO  cyclobenzaprine  (FLEXERIL ) 10 MG tablet Take 1 tablet (10 mg total) by mouth 3 (three) times daily as needed for muscle spasms. 12/09/20   Louis Shove, MD  doxycycline  (VIBRAMYCIN ) 100 MG capsule Take 1 capsule (100 mg total) by mouth 2 (two) times daily. Take with food as can cause GI distress. 12/04/23   Zuleta,  Kaitlin G, NP  DULoxetine  (CYMBALTA ) 20 MG capsule Take 20 mg by mouth daily.    [provider]  estradiol  (ESTRACE ) 0.1 MG/GM vaginal cream Place 0.5g nightly for two weeks then twice a week after 11/16/23   Marilynne Rosaline SAILOR, MD  ezetimibe  (ZETIA ) 10 MG tablet Take 1 tablet (10 mg total) by mouth daily. 05/15/23   Jeffrie Oneil BROCKS, MD  fluorouracil (EFUDEX) 5 % cream Apply 1 Application topically 2 (two) times daily. 04/27/23   [provider]  FLUoxetine  (PROZAC ) 40 MG capsule Take 1 capsule (40 mg total) by mouth daily. NEEDS APPT FOR REFILLS 07/10/12   Darlean Ozell NOVAK, MD  HYDROcodone -acetaminophen  (NORCO/VICODIN) 5-325 MG tablet 1 po q 4-6hrs prn pain Patient taking differently: Take 1 tablet by mouth every 4 (four) hours as needed for moderate pain (pain score 4-6). 01/04/19   Addie Cordella Hamilton, MD  insulin  lispro (HUMALOG ) 100 UNIT/ML injection Inject 2 Units into the skin See admin instructions. Inject 2 units SQ up to 6 times daily as needed for BS > 250    [provider]  metoprolol  succinate (TOPROL -XL) 25 MG 24 hr tablet Take 1 tablet (25 mg total) by mouth daily. 07/19/23   Jeffrie Oneil BROCKS, MD  MOUNJARO 10 MG/0.5ML Pen Inject 10 mg into the skin. 02/09/24   [provider]  nitroGLYCERIN  (NITROSTAT ) 0.4 MG SL tablet  Place 1 tablet (0.4 mg total) under the tongue every 5 (five) minutes as needed for chest pain. 04/21/23   Swinyer, Rosaline HERO, NP  OZEMPIC , 1 MG/DOSE, 4 MG/3ML SOPN Inject 2 mg into the skin once a week. 01/18/22   [provider]  pantoprazole  (PROTONIX ) 40 MG tablet Take 40 mg by mouth 2 (two) times daily. 11/17/20   [provider]  phenazopyridine  (PYRIDIUM ) 200 MG tablet Take 1 tablet (200 mg total) by mouth 3 (three) times daily as needed for pain. 09/12/23   Marilynne Rosaline SAILOR, MD  rosuvastatin  (CRESTOR ) 5 MG tablet Take 1 tablet (5 mg total) by mouth daily. 02/21/24   Jeffrie Oneil BROCKS, MD  TRESIBA  FLEXTOUCH 100 UNIT/ML  SOPN FlexTouch Pen Inject 50 Units into the skin daily.  08/19/18   [provider]  trospium  (SANCTURA ) 20 MG tablet Take 1 tablet (20 mg total) by mouth 2 (two) times daily. 09/19/23   Marilynne Rosaline SAILOR, MD    Allergies: Latex, Penicillins, Erythromycin, Ofloxacin, Sulfonamide derivatives, Azithromycin , Fenofibrate, and Statins    Review of Systems  Updated Vital Signs BP (!) 128/90   Pulse 70   Temp 98.4 F (36.9 C) (Oral)   Resp 18   SpO2 98%   Physical Exam Vitals and nursing note reviewed.  Constitutional:      General: [name] is not in acute distress.    Appearance: [name] is well-developed. [name] is not ill-appearing.  HENT:     Head: Normocephalic and atraumatic.  Eyes:     General: No visual field deficit.    Conjunctiva/sclera: Conjunctivae normal.  Cardiovascular:     Rate and Rhythm: Normal rate and regular rhythm.     Heart sounds: No murmur heard. Pulmonary:     Effort: Pulmonary effort is normal. No respiratory distress.     Breath sounds: Normal breath sounds.  Abdominal:     Palpations: Abdomen is soft.     Tenderness: There is no abdominal tenderness.  Musculoskeletal:        General: No swelling.     Cervical back: Neck supple.  Skin:    General: Skin is warm and dry.     Capillary Refill: Capillary refill takes less than 2 seconds.  Neurological:     Mental Status: [name] is alert.     Cranial Nerves: No cranial nerve deficit.     Motor: No weakness.     Coordination: Coordination normal.     Comments: Maybe some subjective numbness but both to the left side of her mid face and the right side of her mid face but otherwise sensations intact speech is intact visual fields intact strength normal normal speech normal cranial nerves  Psychiatric:        Mood and Affect: Mood normal.     (all labs ordered are listed, but only abnormal results are displayed) Labs Reviewed  BASIC METABOLIC PANEL WITH GFR - Abnormal; Notable for the following  components:      Result Value   Glucose, Bld 170 (*)    All other components within normal limits  CBC WITH DIFFERENTIAL/PLATELET    EKG: None  Radiology: MR BRAIN WO CONTRAST Result Date: 11/02/2024 CLINICAL DATA:  Initial evaluation for acute neuro deficit, stroke suspected. EXAM: MRI HEAD WITHOUT CONTRAST TECHNIQUE: Multiplanar, multiecho pulse sequences of the brain and surrounding structures were obtained without intravenous contrast. COMPARISON:  None Available. FINDINGS: Brain: Cerebral volume within normal limits for age. Minimal patchy T2/FLAIR hyperintensity seen involving the  supratentorial cerebral white matter, nonspecific, but less than is typically seen for age. No abnormal foci of restricted diffusion to suggest acute or subacute ischemia. Gray-white matter differentiation maintained. No areas of chronic cortical infarction. No acute or chronic intracranial blood products. No mass lesion, midline shift or mass effect. Ventricles normal size without hydrocephalus. No extra-axial fluid collection. Pituitary gland within normal limits. Vascular: Major intracranial vascular flow voids are maintained. Skull and upper cervical spine: Craniocervical junction within normal limits. 1 cm T1 hypointense lesion seen involving the C2 vertebral body (series 13, image 12), indeterminate. No scalp soft tissue abnormality. Sinuses/Orbits: Globes and orbital soft tissues demonstrate no acute finding. Prior bilateral ocular lens replacement. Mucosal thickening with superimposed air-fluid levels present within the maxillary sinuses bilaterally. No significant mastoid effusion. Other: None. IMPRESSION: 1. No acute intracranial abnormality. 2. 1 cm T1 hypointense lesion involving the C2 vertebral body, incompletely of assessed on this exam. Finding is nonspecific, with possible focal marrow replacing lesion/osseous metastasis not excluded. Correlation with dedicated MRI of the cervical spine, with and without  contrast, recommended for further evaluation. 3. Mucosal thickening with air-fluid levels within both maxillary sinuses. Clinical correlation for possible acute sinusitis recommended. Electronically Signed   By: Morene Hoard M.D.   On: 11/02/2024 20:08     Procedures   Medications Ordered in the ED  clindamycin  (CLEOCIN ) capsule 300 mg (300 mg Oral Given 11/02/24 2058)  sodium chloride  0.9 % bolus 500 mL (500 mLs Intravenous New Bag/Given 11/02/24 2100)  prochlorperazine  (COMPAZINE ) injection 5 mg (5 mg Intravenous Given 11/02/24 2105)  diphenhydrAMINE  (BENADRYL ) injection 12.5 mg (12.5 mg Intravenous Given 11/02/24 2103)  dexamethasone  (DECADRON ) injection 10 mg (10 mg Intravenous Given 11/02/24 2104)  ketorolac  (TORADOL ) 15 MG/ML injection 15 mg (15 mg Intravenous Given 11/02/24 2101)                                    Medical Decision Making Risk Prescription drug management.   MARIESHA VENTURELLA is here with headache sinus congestion.  Discussed numbness to the left and right side of her mid cheek of her face.  We will activate a code stroke.  I do think that this is likely a complex migraine/sinus migraine.  [name] just got over flulike illness here recently.  Normal vitals.  Normal exam.  Rhythm normal MRI ordered in triage which was done that showed no evidence of stroke.  Some nonspecific signal CT vertebral body.  [name] understands that [name] will need an MRI with and without contrast of her cervical spine to further evaluate this area.  [name] has no cancer history.  We talked about how this could be concerning for some sort of bony lesion but should get further assessment outpatient.  This is incidental.  Is not having any upper extremity symptoms.  I have no concern for other emergent neurologic process.  No stroke on MRI.  [name] does have acute sinusitis however on MRI which I suspect is from her recent illness.  This is where [name] is have a lot of her congestion and discomfort.  [name] is given a  migraine cocktail with great improvement of her symptoms.  Will prescribe clindamycin  and Medrol  Dosepak for further pain management.  Lab work was otherwise unremarkable.  Have no concern for MS or any other acute neurologic process.  I do suspect this is from inflammatory process sinusitis process from recent flulike illness.  [name] will follow-up with her spine doctor and her primary care doctor regarding need for MRI of her cervical spine.  Patient discharged in good condition.  Understands return precautions.  This chart was dictated using voice recognition software.  Despite best efforts to proofread,  errors can occur which can change the documentation meaning.      Final diagnoses:  Acute sinusitis, recurrence not specified, unspecified location  Sinus headache    ED Discharge Orders          Ordered    methylPREDNISolone  (MEDROL  DOSEPAK) 4 MG TBPK tablet        11/02/24 2123    clindamycin  (CLEOCIN ) 300 MG capsule  3 times daily        11/02/24 2123               Ruthe Cornet, DO 11/02/24 2140  "

## 2024-11-02 NOTE — Discharge Instructions (Signed)
 Talk with your primary care doctor spine doctor about ordering MRI with and without of your cervical spine as we discussed.  Look at your MyChart to show them incidental finding on your vertebral body of C2 that needs to be further evaluated.  Continue Medrol  Dosepak as prescribed tomorrow.  Continue Tylenol  for pain as well.  Continue antibiotics.

## 2024-11-11 ENCOUNTER — Encounter: Payer: Self-pay | Admitting: *Deleted

## 2024-11-28 ENCOUNTER — Other Ambulatory Visit: Payer: Self-pay | Admitting: Cardiology
